# Patient Record
Sex: Male | Born: 1940 | Race: White | Hispanic: No | Marital: Married | Smoking: Never smoker
Health system: Southern US, Community
[De-identification: ages and names within clinical notes are randomized; demographics above are authoritative.]

## PROBLEM LIST (undated history)

## (undated) DIAGNOSIS — R21 Rash and other nonspecific skin eruption: Secondary | ICD-10-CM

## (undated) DIAGNOSIS — E781 Pure hyperglyceridemia: Secondary | ICD-10-CM

## (undated) DIAGNOSIS — I2510 Atherosclerotic heart disease of native coronary artery without angina pectoris: Secondary | ICD-10-CM

## (undated) DIAGNOSIS — G459 Transient cerebral ischemic attack, unspecified: Secondary | ICD-10-CM

## (undated) DIAGNOSIS — N390 Urinary tract infection, site not specified: Secondary | ICD-10-CM

## (undated) DIAGNOSIS — I10 Essential (primary) hypertension: Secondary | ICD-10-CM

## (undated) DIAGNOSIS — R079 Chest pain, unspecified: Secondary | ICD-10-CM

## (undated) DIAGNOSIS — R0609 Other forms of dyspnea: Secondary | ICD-10-CM

## (undated) DIAGNOSIS — I214 Non-ST elevation (NSTEMI) myocardial infarction: Secondary | ICD-10-CM

## (undated) DIAGNOSIS — K219 Gastro-esophageal reflux disease without esophagitis: Secondary | ICD-10-CM

## (undated) DIAGNOSIS — E785 Hyperlipidemia, unspecified: Secondary | ICD-10-CM

## (undated) DIAGNOSIS — I639 Cerebral infarction, unspecified: Secondary | ICD-10-CM

## (undated) DIAGNOSIS — R202 Paresthesia of skin: Secondary | ICD-10-CM

## (undated) DIAGNOSIS — R200 Anesthesia of skin: Secondary | ICD-10-CM

## (undated) DIAGNOSIS — J189 Pneumonia, unspecified organism: Secondary | ICD-10-CM

## (undated) DIAGNOSIS — Z201 Contact with and (suspected) exposure to tuberculosis: Secondary | ICD-10-CM

## (undated) HISTORY — DX: Contact with and (suspected) exposure to tuberculosis: Z20.1

## (undated) HISTORY — DX: Paresthesia of skin: R20.2

## (undated) HISTORY — DX: Anesthesia of skin: R20.0

## (undated) HISTORY — DX: Rash and other nonspecific skin eruption: R21

## (undated) HISTORY — DX: Hyperlipidemia, unspecified: E78.5

## (undated) HISTORY — DX: Essential (primary) hypertension: I10

## (undated) HISTORY — DX: Other forms of dyspnea: R06.09

## (undated) HISTORY — DX: Pure hyperglyceridemia: E78.1

## (undated) HISTORY — PX: BACK SURGERY: SHX140

## (undated) HISTORY — DX: Chest pain, unspecified: R07.9

## (undated) HISTORY — DX: Atherosclerotic heart disease of native coronary artery without angina pectoris: I25.10

## (undated) HISTORY — DX: Cerebral infarction, unspecified: I63.9

## (undated) HISTORY — DX: Non-ST elevation (NSTEMI) myocardial infarction: I21.4

---

## 1997-11-15 ENCOUNTER — Ambulatory Visit (HOSPITAL_COMMUNITY): Admission: RE | Admit: 1997-11-15 | Discharge: 1997-11-15 | Payer: Self-pay | Admitting: Orthopedic Surgery

## 2000-10-01 ENCOUNTER — Emergency Department (HOSPITAL_COMMUNITY): Admission: AC | Admit: 2000-10-01 | Discharge: 2000-10-01 | Payer: Self-pay

## 2003-04-05 ENCOUNTER — Emergency Department (HOSPITAL_COMMUNITY): Admission: EM | Admit: 2003-04-05 | Discharge: 2003-04-05 | Payer: Self-pay | Admitting: Emergency Medicine

## 2005-06-30 ENCOUNTER — Emergency Department (HOSPITAL_COMMUNITY): Admission: EM | Admit: 2005-06-30 | Discharge: 2005-06-30 | Payer: Self-pay | Admitting: Family Medicine

## 2005-07-05 ENCOUNTER — Emergency Department (HOSPITAL_COMMUNITY): Admission: EM | Admit: 2005-07-05 | Discharge: 2005-07-05 | Payer: Self-pay | Admitting: Family Medicine

## 2006-12-10 ENCOUNTER — Emergency Department (HOSPITAL_COMMUNITY): Admission: EM | Admit: 2006-12-10 | Discharge: 2006-12-10 | Payer: Self-pay | Admitting: Family Medicine

## 2007-01-13 DIAGNOSIS — I214 Non-ST elevation (NSTEMI) myocardial infarction: Secondary | ICD-10-CM

## 2007-01-13 HISTORY — DX: Non-ST elevation (NSTEMI) myocardial infarction: I21.4

## 2007-03-02 ENCOUNTER — Ambulatory Visit: Payer: Self-pay | Admitting: Cardiovascular Disease

## 2007-03-02 ENCOUNTER — Inpatient Hospital Stay (HOSPITAL_COMMUNITY): Admission: EM | Admit: 2007-03-02 | Discharge: 2007-03-05 | Payer: Self-pay | Admitting: Emergency Medicine

## 2007-04-23 ENCOUNTER — Emergency Department (HOSPITAL_COMMUNITY): Admission: EM | Admit: 2007-04-23 | Discharge: 2007-04-23 | Payer: Self-pay | Admitting: Family Medicine

## 2008-01-21 ENCOUNTER — Emergency Department (HOSPITAL_COMMUNITY): Admission: EM | Admit: 2008-01-21 | Discharge: 2008-01-21 | Payer: Self-pay | Admitting: Family Medicine

## 2008-01-24 ENCOUNTER — Ambulatory Visit (HOSPITAL_COMMUNITY): Admission: RE | Admit: 2008-01-24 | Discharge: 2008-01-24 | Payer: Self-pay | Admitting: Specialist

## 2008-01-26 ENCOUNTER — Encounter: Admission: RE | Admit: 2008-01-26 | Discharge: 2008-01-26 | Payer: Self-pay | Admitting: Specialist

## 2008-07-18 ENCOUNTER — Emergency Department (HOSPITAL_COMMUNITY): Admission: EM | Admit: 2008-07-18 | Discharge: 2008-07-18 | Payer: Self-pay | Admitting: Family Medicine

## 2008-07-23 ENCOUNTER — Emergency Department (HOSPITAL_COMMUNITY): Admission: EM | Admit: 2008-07-23 | Discharge: 2008-07-23 | Payer: Self-pay | Admitting: Family Medicine

## 2008-09-17 ENCOUNTER — Emergency Department (HOSPITAL_COMMUNITY): Admission: EM | Admit: 2008-09-17 | Discharge: 2008-09-17 | Payer: Self-pay | Admitting: Emergency Medicine

## 2008-09-21 HISTORY — PX: US ECHOCARDIOGRAPHY: HXRAD669

## 2008-10-03 HISTORY — PX: CARDIOVASCULAR STRESS TEST: SHX262

## 2009-01-25 ENCOUNTER — Emergency Department (HOSPITAL_COMMUNITY): Admission: EM | Admit: 2009-01-25 | Discharge: 2009-01-25 | Payer: Self-pay | Admitting: Family Medicine

## 2009-09-13 ENCOUNTER — Ambulatory Visit: Payer: Self-pay | Admitting: Cardiology

## 2010-03-29 LAB — URINE CULTURE: Colony Count: >=100000

## 2010-03-29 LAB — POCT URINALYSIS DIP (DEVICE)
Bilirubin Urine: NEGATIVE
Glucose, UA: NEGATIVE mg/dL
Ketones, ur: NEGATIVE mg/dL
Nitrite: POSITIVE — AB
Protein, ur: NEGATIVE mg/dL
Specific Gravity, Urine: 1.01 (ref 1.005–1.030)
Urobilinogen, UA: 0.2 mg/dL (ref 0.0–1.0)
pH: 5.5 (ref 5.0–8.0)

## 2010-04-11 ENCOUNTER — Telehealth: Payer: Self-pay | Admitting: Cardiology

## 2010-04-11 NOTE — Telephone Encounter (Signed)
ASKING FOR SAMPLES OF TRILIPIX. PLACED CHART IN BOX.

## 2010-04-11 NOTE — Telephone Encounter (Signed)
Called requesting samples of Trilipix. LM that we do not have any samples at this time.

## 2010-04-18 LAB — URINALYSIS, ROUTINE W REFLEX MICROSCOPIC
Bilirubin Urine: NEGATIVE
Glucose, UA: NEGATIVE mg/dL
Hgb urine dipstick: NEGATIVE
Ketones, ur: NEGATIVE mg/dL
Nitrite: NEGATIVE
Protein, ur: NEGATIVE mg/dL
Specific Gravity, Urine: 1.017 (ref 1.005–1.030)
Urobilinogen, UA: 0.2 mg/dL (ref 0.0–1.0)
pH: 6.5 (ref 5.0–8.0)

## 2010-04-18 LAB — COMPREHENSIVE METABOLIC PANEL
ALT: 21 U/L (ref 0–53)
AST: 27 U/L (ref 0–37)
Albumin: 3.6 g/dL (ref 3.5–5.2)
Alkaline Phosphatase: 39 U/L (ref 39–117)
BUN: 19 mg/dL (ref 6–23)
CO2: 26 mEq/L (ref 19–32)
Calcium: 9.1 mg/dL (ref 8.4–10.5)
Chloride: 106 mEq/L (ref 96–112)
Creatinine, Ser: 0.98 mg/dL (ref 0.4–1.5)
GFR calc Af Amer: >60 mL/min (ref >60)
GFR calc non Af Amer: >60 mL/min (ref >60)
Glucose, Bld: 82 mg/dL (ref 70–99)
Potassium: 3.7 mEq/L (ref 3.5–5.1)
Sodium: 139 mEq/L (ref 135–145)
Total Bilirubin: 0.7 mg/dL (ref 0.3–1.2)
Total Protein: 6.6 g/dL (ref 6.0–8.3)

## 2010-04-18 LAB — DIFFERENTIAL
Basophils Absolute: 0.1 10*3/uL (ref 0.0–0.1)
Basophils Relative: 1 % (ref 0–1)
Eosinophils Absolute: 0 10*3/uL (ref 0.0–0.7)
Eosinophils Relative: 0 % (ref 0–5)
Lymphocytes Relative: 20 % (ref 12–46)
Lymphs Abs: 1.5 10*3/uL (ref 0.7–4.0)
Monocytes Absolute: 0.5 10*3/uL (ref 0.1–1.0)
Monocytes Relative: 7 % (ref 3–12)
Neutro Abs: 5.5 10*3/uL (ref 1.7–7.7)
Neutrophils Relative %: 72 % (ref 43–77)

## 2010-04-18 LAB — LIPASE, BLOOD: Lipase: 28 U/L (ref 11–59)

## 2010-04-18 LAB — CBC
HCT: 38.8 % — ABNORMAL LOW (ref 39.0–52.0)
Hemoglobin: 13.2 g/dL (ref 13.0–17.0)
MCHC: 34.1 g/dL (ref 30.0–36.0)
MCV: 90.3 fL (ref 78.0–100.0)
Platelets: 145 10*3/uL — ABNORMAL LOW (ref 150–400)
RBC: 4.29 MIL/uL (ref 4.22–5.81)
RDW: 13.9 % (ref 11.5–15.5)
WBC: 7.6 10*3/uL (ref 4.0–10.5)

## 2010-05-08 ENCOUNTER — Telehealth: Payer: Self-pay | Admitting: Cardiology

## 2010-05-08 NOTE — Telephone Encounter (Signed)
ASKING FOR SAMPLES OF TRILIPIX.

## 2010-05-09 NOTE — Telephone Encounter (Signed)
Called requesting samples of Trilipix. None available at this time.LM

## 2010-05-19 ENCOUNTER — Other Ambulatory Visit: Payer: Self-pay | Admitting: Cardiology

## 2010-05-19 MED ORDER — CHOLINE FENOFIBRATE 135 MG PO CPDR: 135.0000 mg | DELAYED_RELEASE_CAPSULE | Freq: Every day | ORAL | Status: DC

## 2010-05-19 NOTE — Telephone Encounter (Signed)
NEEDS REFILL FOR TRIPLIPIX CALLED INTO [facility] [place] [id number]. PLACED CHART IN BOX.

## 2010-05-19 NOTE — Telephone Encounter (Signed)
Called requesting samples of Trilipix. Only had 2 boxes which will give to them. Also received refill for trilipix. escribed.

## 2010-05-27 NOTE — Cardiovascular Report (Signed)
NAME:  Colin Lopez, Colin Lopez NO.:  [id number]   MEDICAL RECORD NO.:  [id number]          PATIENT TYPE:  INP   LOCATION:  2925                         FACILITY:  [facility]   PHYSICIAN:  Elmore Guise., M.D.DATE OF BIRTH:  06-Jun-1940   DATE OF PROCEDURE:  03/03/2007  DATE OF DISCHARGE:                            CARDIAC CATHETERIZATION   INDICATIONS FOR PROCEDURE:  Non-ST-elevation myocardial infarction.   HISTORY OF PRESENT ILLNESS:  Mr. Neas is a very pleasant 70 year old  white male, with past medical history of hypertension, dyslipidemia, and  tobacco dependence, who presented with a 3-day history of increasing  exertional chest pain.  He is admitted with non-ST-elevation myocardial  infarction.  He is now referred for cardiac catheterization.   DESCRIPTION OF PROCEDURE:  The patient is brought to the cardiac  catheterization lab. After appropriate informed consent, he is prepped  and draped in sterile fashion.  Approximately 10 mL of 1% lidocaine was  used for local anesthesia.  A 6-French sheath was placed in the right  femoral artery without difficulty.  Coronary angiography, LV angiography  were then performed.  Sheath was left in place for elective PCI of his  circumflex/OM system.   FINDINGS:  1. Left main:  Short and normal appearing  2. LAD:  Moderate-sized vessel, with mild luminal regularities  3. D1:  Ostial proximal 20%-30% stenosis, with mild mid-distal luminal      irregularities.  4. D2:  Moderate-sized vessel, with mild luminal irregularities.  5. LCX:  Nondominant large vessel, mid 80-90% stenosis at bifurcation      with OM1 and OM2.  6. OM1/OM2:  Large vessels, with mild luminal irregularities.  7. RCA:  Dominant large vessel, proximal 50% stenosis, with diffuse      mid and distal luminal irregularities.  Faint collaterals were seen      to the left system.  8. PDA/PLV is patent, with mild to moderate luminal irregularities.  9. LV:  EF is  55%.  No wall motion abnormalities.  LVEDP is 12 mmHg.   IMPRESSION:  1. Obstructive single-vessel coronary artery disease (80%-90% mid left      circumflex stenosis).  2. Moderate nonobstructive right coronary artery disease, with      proximal 50% stenosis.  3. Nonobstructive left anterior descending.  4. Normal left ventricular systolic function, with an ejection      fraction of 55%.   PLAN:  At this time, I will discuss elective PCI of his LCx with Dr.  Swaziland (interventional cardiologist).  Otherwise, we will continue  aggressive medical treatment and risk factor modification as indicated.  The patient is on statin, aspirin, beta blockade.  We will discuss  complete tobacco cessation with him at length.  He will also be placed  on Plavix      Elmore Guise., M.D.  Electronically Signed     TWK/MEDQ  D:  03/03/2007  T:  03/04/2007  Job:  [id number]

## 2010-05-27 NOTE — Cardiovascular Report (Signed)
NAME:  Colin Lopez, Colin Lopez NO.:  [id number]   MEDICAL RECORD NO.:  [id number]          PATIENT TYPE:  INP   LOCATION:  2925                         FACILITY:  [facility]   PHYSICIAN:  [name] M. Swaziland, M.D.  DATE OF BIRTH:  May 02, 1940   DATE OF PROCEDURE:  03/03/2007  DATE OF DISCHARGE:                            CARDIAC CATHETERIZATION   INDICATION FOR PROCEDURE:  The patient is a 70 year old white male who  presented with a non-Q-wave myocardial infarction.  Coronary angiography  performed by Dr. Reyes Ivan demonstrated no significant disease in the LAD  distribution.  There was a high-grade 90% stenosis in the left  circumflex coronary artery just prior to bifurcation of the first obtuse  marginal vessel.  The first obtuse marginal vessel and second obtuse  marginal vessel were very large branches.  The right coronary artery  showed 50% disease proximally.  We elected to proceed with intervention  of the left circumflex stenosis.  Access was via the right femoral  artery with a 6-French sheath placed for the diagnostic cardiac  catheterization.  Guide catheter was a 6-French left Judkins 5-guide,  two 2140 Asahi medium wires were used, a 3.5 x 15 mm Maverick balloon,  3.5 x 16 mm  Liberte stent, 3.75 x 12 mm Quantum Maverick balloon and a  3.5 x 10 mm Dura Star balloon.   MEDICATIONS:  1. Angiomax 0.75 mg/kg bolus followed by continuous infusion of 1.75      mg/kg per hour.  Subsequent ACT was 326.  2. Plavix 600 mg orally.  3. Pepcid 20 mg IV.  4. Nitroglycerin 200 mcg intracoronary x 1.   PROCEDURE NOTE:  After initial guide shots were obtained, we proceeded  with intervention.  Both the first and second marginal vessels were  independently wired.  We then performed balloon inflation of the  circumflex lesion with the balloon crossing the wire down the second  marginal vessel.  This was dilated to 6 atmospheres.  This resulted in  good expansion of the lesion, but  there was significant focal intimal  disruption at the site of balloon  angioplasty.  We next proceeded to  stent the circumflex using a 3.5 x 16 mm Liberte stent.  This was  deployed across the first obtuse marginal vessel extending to the mid  circumflex.  It was deployed at 9 and then 12 atmospheres.  We next  postdilated the proximal portion of the stent using a 3.75 x 12 mm  Quantum Maverick balloon up to 12 atmospheres.  We dilated the distal  portion of the stent using a 3.5 x 10 mm Dura Star balloon up to 16  atmospheres.  This yielded an excellent angiographic result with 0%  residual stenosis.  There was no compromise of the first obtuse marginal  vessel which had only about 20% narrowing at the ostium.  There was TIMI  grade 3 flow down both branches.  The patient had some mild burning in  his throat, but otherwise was asymptomatic and remained hemodynamically  stable.  At this point, his right groin was closed using an  Angioseal  device with excellent hemostasis.   FINAL INTERPRETATION:  Successful intracoronary stenting of the proximal  to mid left circumflex coronary artery.   PLAN:  I would recommend continuing aspirin and Plavix postprocedure.           ______________________________  [name] M. Swaziland, M.D.     PMJ/MEDQ  D:  03/03/2007  T:  03/04/2007  Job:  [id number]   cc:   Elmore Guise., M.D.

## 2010-05-27 NOTE — H&P (Signed)
NAME:  Colin Lopez, Colin Lopez NO.:  [id number]   MEDICAL RECORD NO.:  [id number]          PATIENT TYPE:  INP   LOCATION:  2925                         FACILITY:  [facility]   PHYSICIAN:  Christell Faith, MD   DATE OF BIRTH:  06-10-1940   DATE OF ADMISSION:  03/02/2007  DATE OF DISCHARGE:                              HISTORY & PHYSICAL   CHIEF COMPLAINT:  Chest pain.   HISTORY OF PRESENT ILLNESS:  This is a 70 year old white man with a past  history of hypertension who presents to the [facility] Emergency  Department tonight with intermittent chest discomfort for the past 5  days.  It is described as a substernal burning, associated with a lot of  belching and is similar in quality to what he has previously thought was  acid reflux.  However, he has no sour brash taste in his mouth and this  pain has been coming on with exertion and in those regards is different  from his prior acid reflux.  In addition, earlier tonight there was a  strong pressure component and his pain rated 10/10, which precipitated  the trip to the emergency department.  He also complains of shortness of  breath with the chest discomfort and aching in his left shoulder.  He  denies a history of ulcer disease, denies blood in the stool, or blood  in his urine.  With nitroglycerin his pain improved but his blood  pressure dropped to 70/40 and he became very lightheaded.  He has  significant decreased p.o. intake recently and is dehydrated.  His pain  is currently 4/10, after nitroglycerin and morphine.   PAST MEDICAL HISTORY:  Hypertension.   SOCIAL HISTORY:  He lives in [place] with his wife.  He is a retired  Probation officer.  He rarely uses alcohol.  No tobacco.  No illegal drugs.   FAMILY HISTORY:  His mother and father's health is unknown.  The patient  is an orphan.  He has a son who died of congenital heart problems.  A  sister died of an MI in her 24s.  Another sister had bypass surgery in  her  53s.   ALLERGIES:  PENICILLIN.   MEDICINES:  None.   REVIEW OF SYSTEMS:  Positive for headache, chest pain, shortness of  breath, dyspnea on exertion, nausea, GERD, and a rash on his left leg,  otherwise the balance of 14 systems is reviewed and is negative.   PHYSICAL EXAMINATION:  VITAL SIGNS:  Temperature 96.6, pulse 87,  respiratory rate 15, blood pressure initially 174/116, and after  morphine and nitroglycerin was down to 70/40.  Oxygen saturation 94% on  room air.  GENERAL:  This is a pleasant white man in no acute distress.  Awake,  alert, oriented x3.  HEENT:  Pupils equal, round, reactive to light.  Extraocular movements  are intact.  Sclerae are clear.  Dentition is fair.  Mucous membranes  are moist.  No oral lesions.  No thrush.  NECK:  Supple.  Neck veins are flat.  No carotid bruits.  No cervical  lymphadenopathy.  No thyromegaly.  CARDIAC:  Normal rate, regular rhythm.  No murmurs or gallops.  ABDOMEN:  Soft, nontender, nondistended.  Normal bowel sounds.  LUNGS:  Clear to auscultation bilaterally without wheezing or rales.  SKIN:  Reveals a flat red rash on the left thigh which is pruritic.  EXTREMITIES:  No edema.  Skin is warm and dry.  Dorsalis pedis and  radial pulses are 2 plus bilaterally.  Right femoral pulse is 2 plus  without bruit.  MUSCULOSKELETAL:  There are no joint effusions or tenderness.  NEUROLOGIC:  No gross deficits.   REVIEW OF DIAGNOSTIC TESTS:  Chest x-ray:  Pending.  Electrocardiogram  shows a sinus rhythm with a rate of 86 beats per minute.  There is  probable left ventricular hypertrophy and subtle lateral ST depressions  which are new compared to prior EKG.   LABORATORY:  White blood cell 9.2, hemoglobin 15.9, platelets 126.  Sodium 135, potassium 3.7, BUN 18, creatinine 1.1.  Point-of-care CK-MB  12, point-of-care troponin 0.28.   IMPRESSION:  A 70 year old white man with chest burning occurring both  after eating and with exertion.   There is also a component of  progressive chest pressure which has been occurring with exertion and  which is associated with left shoulder pain and shortness of breath.  This has been getting worse for several months now.  Today, it was  severe and occurred at rest and is currently rated 4/10.  He has a  mildly abnormal electrocardiogram and point-of-care troponin.   PLAN:  1. Given his blood pressure issues on the nitroglycerin, we will admit      him to a stepdown cardiac unit.  2. Continue to cycle cardiac enzymes and EKGs to evaluate for      myocardial injury.  3. Treat with therapeutic heparin and aspirin as well as Lopressor and      Statin therapy.  4. We will attempt to make him pain free with IV nitroglycerin and      morphine.  5. He will be made NPO for probable cardiac catheterization in the      morning.  6. He will be hydrated overnight.  7. Check fasting lipid panel and thyroid tests.  8. We will hemoccult check his stools and use empiric b.i.d. proton      pump inhibitor.  9. Initiate glycoprotein IIb IIIa inhibitor for ongoing pain or      abnormal troponin, cath emergently if needed.      Christell Faith, MD  Electronically Signed     NDL/MEDQ  D:  03/02/2007  T:  03/03/2007  Job:  [id number]

## 2010-05-30 NOTE — Discharge Summary (Signed)
NAME:  Colin Lopez, Colin Lopez NO.:  [id number]   MEDICAL RECORD NO.:  [id number]          PATIENT TYPE:  INP   LOCATION:  4741                         FACILITY:  [facility]   PHYSICIAN:  Elmore Guise., M.D.DATE OF BIRTH:  09-Jul-1940   DATE OF ADMISSION:  03/02/2007  DATE OF DISCHARGE:  03/05/2007                               DISCHARGE SUMMARY   DISCHARGE DIAGNOSES:  1. Non-ST elevation myocardial infarction, status post elective PCI of      circumflex vessel.  2. Dyslipidemia.  3. History of hypertension.  4. Gastroesophageal reflux disease.   HISTORY OF PRESENT ILLNESS:  Colin Lopez is a very pleasant 70 year old  white male who was admitted on March 02, 2007, for evaluation of  acute coronary syndrome and non-ST elevation myocardial infarction.   HOSPITAL COURSE:  The patient's hospital course was uncomplicated.  He  underwent cardiac catheterization on March 03, 2007, showing a normal  appearing left main, moderate size LAD with mild luminal irregularities.  First and second diagonals had proximal 20%-30% stenosis with mild mid  and distal luminal irregularities. The circumflex was a large vessel  with mid 80%-90% stenosis at bifurcation with OM-1 vessel.  OM-1 and OM-  2 both were large vessels with mild luminal irregularities.  His RCA was  a dominant vessel, large-appearing with proximal 50% stenosis with  diffuse mid and distal luminal irregularities.  Faint collaterals were  noted from the distal right coronary to his left system.  Following his  diagnostic cath, the patient underwent successful PCI of his circumflex.   LABORATORY DATA:  His blood work during his hospitalization showed a  peak troponin of 6.5.  He had normal renal function with a BUN and  creatinine of 18 and 1.0, potassium level 4.1.  His platelets were a  little on the lower side following his catheterization.  He was kept  overnight to make sure that his platelets would come back up  to normal  range.  We discontinued his heparin and Pepcid, and he was finished with  Angiomax at that time.  He had a platelet later of 88,000.  On day of  discharge, his platelets had increased up to 102,000.  We did continue  his Plavix.  He has now been up and ambulatory without any significant  problems.  He will be discharged to home.   DISCHARGE MEDICATIONS:  1. Lipitor 80 mg daily.  2. Aspirin 325 mg daily.  3. Plavix 75 mg daily.  4. Metoprolol 25 mg twice daily.  5. Nitroglycerin 0.4 mg sublingual q.5 minutes p.r.n. chest pain.   FOLLOWUP:  His followup appointment will be with Dr. Lady Deutscher at  [facility] in 2 weeks.  He was advised to increase his  activity slowly.  He will have routine post cath/intervention  restrictions. He did have a non-drug-eluting stent placed.      Elmore Guise., M.D.  Electronically Signed     TWK/MEDQ  D:  03/24/2007  T:  03/25/2007  Job:  [id number]

## 2010-09-02 ENCOUNTER — Other Ambulatory Visit: Payer: Self-pay | Admitting: Cardiology

## 2010-10-03 LAB — DIFFERENTIAL
Basophils Absolute: 0
Basophils Relative: 1
Eosinophils Absolute: 0
Eosinophils Relative: 1
Lymphocytes Relative: 22
Lymphs Abs: 2
Monocytes Absolute: 0.8
Monocytes Relative: 9
Neutro Abs: 6.3
Neutrophils Relative %: 69

## 2010-10-03 LAB — COMPREHENSIVE METABOLIC PANEL
ALT: 22
AST: 27
Albumin: 3.5
Alkaline Phosphatase: 74
BUN: 17
CO2: 25
Calcium: 8 — ABNORMAL LOW
Chloride: 104
Creatinine, Ser: 0.93
GFR calc Af Amer: >60
GFR calc non Af Amer: >60
Glucose, Bld: 141 — ABNORMAL HIGH
Potassium: 3.6
Sodium: 135
Total Bilirubin: 0.9
Total Protein: 5.9 — ABNORMAL LOW

## 2010-10-03 LAB — CBC
HCT: 37.7 — ABNORMAL LOW
HCT: 40.5
HCT: 46
HCT: 46.3
Hemoglobin: 13.1
Hemoglobin: 14
Hemoglobin: 15.9
Hemoglobin: 15.9
MCHC: 34.3
MCHC: 34.5
MCHC: 34.6
MCHC: 34.8
MCV: 86.3
MCV: 86.4
MCV: 86.7
MCV: 86.8
Platelets: 102 — ABNORMAL LOW
Platelets: 126 — ABNORMAL LOW
Platelets: 134 — ABNORMAL LOW
Platelets: 88 — ABNORMAL LOW
RBC: 4.34
RBC: 4.69
RBC: 5.33
RBC: 5.33
RDW: 14.1
RDW: 14.4
RDW: 14.4
RDW: 14.8
WBC: 7.9
WBC: 9.1
WBC: 9.2
WBC: 9.2

## 2010-10-03 LAB — BASIC METABOLIC PANEL
BUN: 18
CO2: 26
Calcium: 8.5
Chloride: 105
Creatinine, Ser: 1.09
GFR calc Af Amer: >60
GFR calc non Af Amer: >60
Glucose, Bld: 94
Potassium: 4.1
Sodium: 137

## 2010-10-03 LAB — CARDIAC PANEL(CRET KIN+CKTOT+MB+TROPI)
CK, MB: 22 — ABNORMAL HIGH
CK, MB: 57.2 — ABNORMAL HIGH
Relative Index: 11.3 — ABNORMAL HIGH
Relative Index: 16.9 — ABNORMAL HIGH
Total CK: 194
Total CK: 338 — ABNORMAL HIGH
Troponin I: 3.84
Troponin I: 6.51

## 2010-10-03 LAB — HEPARIN LEVEL (UNFRACTIONATED): Heparin Unfractionated: <0.1 — ABNORMAL LOW

## 2010-10-03 LAB — CK TOTAL AND CKMB (NOT AT ARMC)
CK, MB: 23.4 — ABNORMAL HIGH
CK, MB: 37.6 — ABNORMAL HIGH
CK, MB: 70.2 — ABNORMAL HIGH
Relative Index: 12.7 — ABNORMAL HIGH
Relative Index: 18.5 — ABNORMAL HIGH
Relative Index: 19.2 — ABNORMAL HIGH
Total CK: 184
Total CK: 203
Total CK: 365 — ABNORMAL HIGH

## 2010-10-03 LAB — PROTIME-INR
INR: 1
Prothrombin Time: 13.4

## 2010-10-03 LAB — POCT CARDIAC MARKERS
CKMB, poc: 11.2
CKMB, poc: 12
Myoglobin, poc: 137
Myoglobin, poc: 175
Operator id: 234501
Operator id: 234501
Troponin i, poc: 0.24 — ABNORMAL HIGH
Troponin i, poc: 0.28 — ABNORMAL HIGH

## 2010-10-03 LAB — MAGNESIUM: Magnesium: 2.5

## 2010-10-03 LAB — TSH: TSH: 3.513

## 2010-10-03 LAB — I-STAT 8, (EC8 V) (CONVERTED LAB)
Acid-Base Excess: 1
BUN: 18
Bicarbonate: 25.2 — ABNORMAL HIGH
Chloride: 104
Glucose, Bld: 118 — ABNORMAL HIGH
HCT: 49
Hemoglobin: 16.7
Operator id: 234501
Potassium: 3.7
Sodium: 135
TCO2: 26
pCO2, Ven: 37.7 — ABNORMAL LOW
pH, Ven: 7.433 — ABNORMAL HIGH

## 2010-10-03 LAB — LIPID PANEL
Cholesterol: 229 — ABNORMAL HIGH
HDL: 34 — ABNORMAL LOW
LDL Cholesterol: 125 — ABNORMAL HIGH
Total CHOL/HDL Ratio: 6.7
Triglycerides: 352 — ABNORMAL HIGH
VLDL: 70 — ABNORMAL HIGH

## 2010-10-03 LAB — TROPONIN I
Troponin I: 0.83
Troponin I: 3.18
Troponin I: 3.33

## 2010-10-03 LAB — POCT I-STAT CREATININE
Creatinine, Ser: 1.1
Operator id: 234501

## 2010-10-03 LAB — APTT: aPTT: 70 — ABNORMAL HIGH

## 2010-10-04 ENCOUNTER — Encounter: Payer: Self-pay | Admitting: Cardiology

## 2010-10-09 ENCOUNTER — Telehealth: Payer: Self-pay | Admitting: Cardiology

## 2010-10-09 NOTE — Telephone Encounter (Addendum)
Pt's wife requesting Trilipix samples

## 2010-10-09 NOTE — Telephone Encounter (Signed)
Wife called requesting samples of Trilipix. Have available. Will pick up in AM

## 2010-10-24 ENCOUNTER — Ambulatory Visit: Payer: Self-pay | Admitting: Cardiology

## 2010-10-28 ENCOUNTER — Ambulatory Visit (INDEPENDENT_AMBULATORY_CARE_PROVIDER_SITE_OTHER): Payer: Medicare Other | Admitting: Cardiology

## 2010-10-28 ENCOUNTER — Encounter: Payer: Self-pay | Admitting: Cardiology

## 2010-10-28 VITALS — BP 162/92 | HR 81 | Ht 73.0 in | Wt 204.0 lb

## 2010-10-28 DIAGNOSIS — I259 Chronic ischemic heart disease, unspecified: Secondary | ICD-10-CM

## 2010-10-28 DIAGNOSIS — I2510 Atherosclerotic heart disease of native coronary artery without angina pectoris: Secondary | ICD-10-CM

## 2010-10-28 DIAGNOSIS — E785 Hyperlipidemia, unspecified: Secondary | ICD-10-CM

## 2010-10-28 DIAGNOSIS — I10 Essential (primary) hypertension: Secondary | ICD-10-CM

## 2010-10-28 MED ORDER — ROSUVASTATIN CALCIUM 10 MG PO TABS: 10.0000 mg | ORAL_TABLET | Freq: Every day | ORAL | Status: DC

## 2010-10-28 MED ORDER — LISINOPRIL 10 MG PO TABS: 10.0000 mg | ORAL_TABLET | Freq: Every day | ORAL | Status: DC

## 2010-10-28 MED ORDER — ATORVASTATIN CALCIUM 10 MG PO TABS: 10.0000 mg | ORAL_TABLET | Freq: Every day | ORAL | Status: DC

## 2010-10-28 NOTE — Progress Notes (Signed)
Colin Lopez Date of Birth: Sep 30, 1940 Medical Record #[id number]  History of Present Illness: Colin Lopez is seen for yearly followup today. He has a history of coronary disease with a non-ST elevation myocardial infarction in 2009. He underwent stenting of the left circumflex coronary with a 3.5 x 16 mm Liberte stent. His last stress nuclear study 2 years ago was normal. He does have a history of hypertension and hyperlipidemia. He is no longer taking Crestor because he states that it made him feel bad. He is not taking lisinopril that we had ordered last year. He denies any significant chest pain or shortness of breath. 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  Current Outpatient Prescriptions on File Prior to Visit  Medication Sig Dispense Refill  . aspirin 325 MG tablet Take 325 mg by mouth daily.        [name] b complex vitamins tablet Take 1 tablet by mouth daily.        . Cholecalciferol (VITAMIN D3 PO) Take by mouth daily.        . Choline Fenofibrate (TRILIPIX) 135 MG capsule Take 1 capsule (135 mg total) by mouth daily.  30 capsule  5  . fish oil-omega-3 fatty acids 1000 MG capsule Take 1,200 mg by mouth 2 (two) times daily.        . Multiple Vitamin (MULTIVITAMIN) tablet Take 1 tablet by mouth daily.        . hydrochlorothiazide 25 MG tablet TAKE ONE TABLET BY MOUTH EVERY DAY  30 tablet  4    Allergies  Allergen Reactions  . Penicillins     Past Medical History  Diagnosis Date  . Coronary disease     Status post stenting of the left circumflex coronary in 2009 with a bare-metal stent (with a 3.5x59mm Liberte stent)  . Dyslipidemia   . Hypertension     Poorly controlled    Past Surgical History  Procedure Date  . US echocardiography 09-21-08    EF 55-60%  . Cardiovascular stress test 10-03-08    EF 59%    History  Smoking status  . Former Smoker  Smokeless tobacco  . Not on file     History  Alcohol Use  . Yes    Rarely    History reviewed. No pertinent family history.  Review of Systems: The review of systems is positive for left shoulder pain as noted. He does not monitor his blood pressure at home..  All other systems were reviewed and are negative.  Physical Exam: BP 162/92  Pulse 81  Ht 6\' 1"  (1.854 m)  Wt 204 lb (92.534 kg)  BMI 26.91 kg/m2 The patient is alert and oriented x 3.  The mood and affect are normal.  The skin is warm and dry.  Color is normal.  The HEENT exam reveals that the sclera are nonicteric.  The mucous membranes are moist. He is hard of hearing. The carotids are 2+ without bruits.  There is no thyromegaly.  There is no JVD.  The lungs are clear.  The chest wall is non tender.  The heart exam reveals a regular rate with a normal S1 and S2.  There are no murmurs, gallops, or rubs.  The PMI is not displaced.   Abdominal exam reveals good bowel sounds.  There is no guarding or rebound.  There is no hepatosplenomegaly or tenderness.  There are no masses.  Exam of the legs reveal no clubbing, cyanosis, or edema.  The legs are without rashes.  The distal pulses are intact.  Cranial nerves II - XII are intact.  Motor and sensory functions are intact.  The gait is normal.  LABORATORY DATA:   Assessment / Plan:

## 2010-10-28 NOTE — Assessment & Plan Note (Signed)
He is status post stenting of the midcircumflex coronary in 2009. He is asymptomatic. Continue on aspirin. We will focus on risk factor modification.

## 2010-10-28 NOTE — Patient Instructions (Addendum)
We will add lisinopril 10 mg daily for blood pressure control.  We will add Lipitor 10 mg daily for your cholesterol.  Continue your other medications.  I will have you return in 2 months for fasting lab work and blood pressure check.  Appointment for fasting labs and BP check will be December 18. Any time that morning. Just make sure you are fasting. May have coffee and take meds with sip of water.

## 2010-10-28 NOTE — Assessment & Plan Note (Signed)
Since he is intolerant of Crestor we will try him on Lipitor 10 mg daily. He will continue with his trilipix and fish oil. We will have him return for fasting lab work in 2 months.

## 2010-10-28 NOTE — Assessment & Plan Note (Signed)
Blood pressure is poorly controlled today. I stressed the importance of sodium restriction. We will add lisinopril 10 mg daily to his current HCTZ. We'll recheck his blood pressure in 2 months.

## 2010-11-28 ENCOUNTER — Telehealth: Payer: Self-pay | Admitting: Cardiology

## 2010-11-28 NOTE — Telephone Encounter (Signed)
New problem  Sample of trilipix 135 mg.

## 2010-11-28 NOTE — Telephone Encounter (Signed)
Samples left at the front desk.  Mrs Scarlata was notified.

## 2010-12-29 ENCOUNTER — Telehealth: Payer: Self-pay | Admitting: Cardiology

## 2010-12-29 NOTE — Telephone Encounter (Signed)
Pt requesting samples of trilipix, ok to leave message

## 2010-12-30 ENCOUNTER — Other Ambulatory Visit: Payer: Medicare Other | Admitting: *Deleted

## 2010-12-30 NOTE — Telephone Encounter (Signed)
Called requesting samples of Trilipix. Have available. Will pick up. 

## 2011-01-28 ENCOUNTER — Telehealth: Payer: Self-pay | Admitting: Cardiology

## 2011-01-28 NOTE — Telephone Encounter (Signed)
New msg Pt wants samples of trilipix please leave a message

## 2011-01-28 NOTE — Telephone Encounter (Signed)
Patient called wanting samples of trilipix.Triplix 135 mg samples left at front desk 3rd floor.

## 2011-04-22 ENCOUNTER — Telehealth: Payer: Self-pay | Admitting: Cardiology

## 2011-04-22 NOTE — Telephone Encounter (Signed)
New Problem:     Patient's wife called in wondering if she could receive some samples of her her husband's Choline Fenofibrate (TRILIPIX) 135 MG capsule. Please call back and feel free to leave a message.

## 2011-04-22 NOTE — Telephone Encounter (Signed)
Patient's wife called back was told left samples of trilipix at front desk 3rd floor.

## 2011-05-26 ENCOUNTER — Telehealth: Payer: Self-pay | Admitting: Cardiology

## 2011-05-26 NOTE — Telephone Encounter (Signed)
Patient called no answer.Left message on personal voice mail Trilipix 135 mg samples left at front desk 3rd floor.

## 2011-05-26 NOTE — Telephone Encounter (Signed)
PER PT SPOUSE CALL THEY NEED SOME SAMPLES OF TRILIPIX 135 AND IF WE DON'T HAVE ANY THEY WOULD LIKE A PRESCRIPTION CALLED INTO [facility] IN [place] PLEASE

## 2011-06-30 ENCOUNTER — Telehealth: Payer: Self-pay | Admitting: Cardiology

## 2011-06-30 NOTE — Telephone Encounter (Signed)
Left message on personal voice mail trilipix 135 mg samples left at front desk 3rd floor.

## 2011-06-30 NOTE — Telephone Encounter (Signed)
New msg Pt wants samples of trilipix 135

## 2011-09-04 ENCOUNTER — Telehealth: Payer: Self-pay | Admitting: Cardiology

## 2011-09-04 NOTE — Telephone Encounter (Signed)
New msg Pt's wife called about trilipix 135 samples

## 2011-09-04 NOTE — Telephone Encounter (Signed)
Patient called spoke to patient's wife was told trilipex 135 mg samples left at 3rd floor front desk.

## 2011-10-22 ENCOUNTER — Telehealth: Payer: Self-pay | Admitting: Cardiology

## 2011-10-22 NOTE — Telephone Encounter (Signed)
5 weeks of samples lot #[id number] e exp 01/2012 put up front for pickup, left message

## 2011-10-22 NOTE — Telephone Encounter (Signed)
New problem  Sample of trilipix 135 mg.  

## 2011-11-05 ENCOUNTER — Ambulatory Visit: Payer: Self-pay | Admitting: Internal Medicine

## 2011-11-26 ENCOUNTER — Telehealth: Payer: Self-pay | Admitting: Cardiology

## 2011-11-26 NOTE — Telephone Encounter (Signed)
Pt calling to see if we have trilipix samples, pls call ok to leave message

## 2011-11-26 NOTE — Telephone Encounter (Signed)
Patient called spoke to wife was told trilipix 135 mg samples left at front desk 3rd floor.

## 2011-11-27 ENCOUNTER — Telehealth: Payer: Self-pay | Admitting: Cardiology

## 2011-11-27 MED ORDER — LISINOPRIL 10 MG PO TABS: 10.0000 mg | ORAL_TABLET | Freq: Every day | ORAL | Status: DC

## 2011-11-27 NOTE — Telephone Encounter (Signed)
Received call from patient's wife stating patient needs refill on lisinopril.Prescription sent to [facility] in [place].

## 2011-11-27 NOTE — Telephone Encounter (Signed)
Patient's wife came by stating that her husband's Lisinopril Rx has expired (according the the pharmacy).  She stated that the bottle indicated one more refill.  Patient needs his Lisinopril refilled.  She will check back with the pharmacy this afternoon.-[facility] [address] [place].

## 2011-11-27 NOTE — Telephone Encounter (Signed)
Patient called no answer.LMTC. 

## 2012-01-11 ENCOUNTER — Telehealth: Payer: Self-pay | Admitting: Cardiology

## 2012-01-11 NOTE — Telephone Encounter (Signed)
Patient called spoke to daughter was told trilipix 135 mg samples left at front desk 3rd floor.

## 2012-01-11 NOTE — Telephone Encounter (Signed)
New problem:   Samples of trilipix  135 mg

## 2012-03-16 ENCOUNTER — Telehealth: Payer: Self-pay

## 2012-03-16 MED ORDER — CHOLINE FENOFIBRATE 135 MG PO CPDR: 135.0000 mg | DELAYED_RELEASE_CAPSULE | Freq: Every day | ORAL | Status: DC

## 2012-03-16 NOTE — Telephone Encounter (Signed)
Spoke to patient's wife was told office out of trilipix samples.Refill sent to [facility] in [place].

## 2012-03-16 NOTE — Telephone Encounter (Signed)
Colin Lopez's wife called in to request samples of Trilipix for her husband. She can be reached at [phone]

## 2012-04-13 ENCOUNTER — Telehealth: Payer: Self-pay | Admitting: Cardiology

## 2012-04-13 NOTE — Telephone Encounter (Signed)
New problem   Pt want to request some free samples of Trilipic 135mg . Please call pt concerning this matter. Leave a message if you would like

## 2012-04-13 NOTE — Telephone Encounter (Signed)
Returned call to patient no answer.Left message on personal voice mail office out of trilipix samples.

## 2012-05-09 ENCOUNTER — Ambulatory Visit: Payer: Self-pay | Admitting: Internal Medicine

## 2012-05-18 ENCOUNTER — Telehealth: Payer: Self-pay | Admitting: Cardiology

## 2012-05-18 NOTE — Telephone Encounter (Signed)
Returned call to patient spoke to wife trilipix samples left at 3rd floor front desk.

## 2012-05-18 NOTE — Telephone Encounter (Signed)
New Problem:    Patient's wife called in requesting samples of the patient's Choline Fenofibrate (TRILIPIX) 135 MG capsule.  Please call back.

## 2012-06-29 ENCOUNTER — Other Ambulatory Visit: Payer: Self-pay

## 2012-06-29 ENCOUNTER — Telehealth: Payer: Self-pay

## 2012-06-29 MED ORDER — LISINOPRIL 10 MG PO TABS: 10.0000 mg | ORAL_TABLET | Freq: Every day | ORAL | Status: DC

## 2012-06-29 NOTE — Telephone Encounter (Signed)
Received call from patient's wife she stated husband needs refill on lisinopril.Advised patient needs appointment with Dr.Jordan.Appointment scheduled 09/02/12.Lisinopril refill sent to pharmacy

## 2012-09-02 ENCOUNTER — Ambulatory Visit (INDEPENDENT_AMBULATORY_CARE_PROVIDER_SITE_OTHER): Payer: Medicare Other | Admitting: Cardiology

## 2012-09-02 ENCOUNTER — Encounter: Payer: Self-pay | Admitting: Cardiology

## 2012-09-02 VITALS — BP 131/83 | HR 71 | Ht 73.0 in | Wt 204.8 lb

## 2012-09-02 DIAGNOSIS — I10 Essential (primary) hypertension: Secondary | ICD-10-CM

## 2012-09-02 DIAGNOSIS — I259 Chronic ischemic heart disease, unspecified: Secondary | ICD-10-CM

## 2012-09-02 DIAGNOSIS — E785 Hyperlipidemia, unspecified: Secondary | ICD-10-CM

## 2012-09-02 DIAGNOSIS — I2510 Atherosclerotic heart disease of native coronary artery without angina pectoris: Secondary | ICD-10-CM

## 2012-09-02 LAB — CBC WITH DIFFERENTIAL/PLATELET
Basophils Absolute: 0 10*3/uL (ref 0.0–0.1)
Basophils Relative: 0.7 % (ref 0.0–3.0)
Eosinophils Absolute: 0 10*3/uL (ref 0.0–0.7)
Eosinophils Relative: 0.5 % (ref 0.0–5.0)
HCT: 42 % (ref 39.0–52.0)
Hemoglobin: 14.3 g/dL (ref 13.0–17.0)
Lymphocytes Relative: 18.2 % (ref 12.0–46.0)
Lymphs Abs: 1.2 10*3/uL (ref 0.7–4.0)
MCHC: 34.1 g/dL (ref 30.0–36.0)
MCV: 87.5 fl (ref 78.0–100.0)
Monocytes Absolute: 0.6 10*3/uL (ref 0.1–1.0)
Monocytes Relative: 8.6 % (ref 3.0–12.0)
Neutro Abs: 4.9 10*3/uL (ref 1.4–7.7)
Neutrophils Relative %: 72 % (ref 43.0–77.0)
Platelets: 151 10*3/uL (ref 150.0–400.0)
RBC: 4.8 Mil/uL (ref 4.22–5.81)
RDW: 14.6 % (ref 11.5–14.6)
WBC: 6.9 10*3/uL (ref 4.5–10.5)

## 2012-09-02 LAB — LIPID PANEL
Cholesterol: 211 mg/dL — ABNORMAL HIGH (ref 0–200)
HDL: 32.3 mg/dL — ABNORMAL LOW (ref >39.00)
Total CHOL/HDL Ratio: 7
Triglycerides: 414 mg/dL — ABNORMAL HIGH (ref 0.0–149.0)
VLDL: 82.8 mg/dL — ABNORMAL HIGH (ref 0.0–40.0)

## 2012-09-02 LAB — BASIC METABOLIC PANEL
BUN: 21 mg/dL (ref 6–23)
CO2: 26 mEq/L (ref 19–32)
Calcium: 9 mg/dL (ref 8.4–10.5)
Chloride: 103 mEq/L (ref 96–112)
Creatinine, Ser: 0.9 mg/dL (ref 0.4–1.5)
GFR: 89.11 mL/min (ref >60.00)
Glucose, Bld: 95 mg/dL (ref 70–99)
Potassium: 4.2 mEq/L (ref 3.5–5.1)
Sodium: 136 mEq/L (ref 135–145)

## 2012-09-02 LAB — HEPATIC FUNCTION PANEL
ALT: 20 U/L (ref 0–53)
AST: 19 U/L (ref 0–37)
Albumin: 3.9 g/dL (ref 3.5–5.2)
Alkaline Phosphatase: 79 U/L (ref 39–117)
Bilirubin, Direct: 0.1 mg/dL (ref 0.0–0.3)
Total Bilirubin: 0.9 mg/dL (ref 0.3–1.2)
Total Protein: 7 g/dL (ref 6.0–8.3)

## 2012-09-02 LAB — LDL CHOLESTEROL, DIRECT: Direct LDL: 93.9 mg/dL

## 2012-09-02 NOTE — Progress Notes (Signed)
Colin Lopez Date of Birth: Jul 23, 1940 Medical Record #[id number]  History of Present Illness: Mr. Kross is seen for yearly followup today. He has a history of coronary disease with a non-ST elevation myocardial infarction in 2009. He underwent stenting of the left circumflex coronary with a 3.5 x 16 mm Liberte stent. His last stress nuclear study 2 years ago was normal. He does have a history of hypertension and hyperlipidemia. He is intolerant to Crestor. On followup today he states he is feeling very well. 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  Current Outpatient Prescriptions on File Prior to Visit  Medication Sig Dispense Refill  . aspirin 325 MG tablet Take 325 mg by mouth daily.        . Choline Fenofibrate (TRILIPIX) 135 MG capsule Take 1 capsule (135 mg total) by mouth daily.  30 capsule  6  . lisinopril (PRINIVIL,ZESTRIL) 10 MG tablet Take 1 tablet (10 mg total) by mouth daily.  30 tablet  2   No current facility-administered medications on file prior to visit.    Allergies  Allergen Reactions  . Penicillins     Past Medical History  Diagnosis Date  . Coronary disease     Status post stenting of the left circumflex coronary in 2009 with a bare-metal stent (with a 3.5x58mm Liberte stent)  . Dyslipidemia   . Hypertension     Poorly controlled    Past Surgical History  Procedure Laterality Date  . US echocardiography  09-21-08    EF 55-60%  . Cardiovascular stress test  10-03-08    EF 59%    History  Smoking status  . Former Smoker  Smokeless tobacco  . Not on file    History  Alcohol Use  . Yes    Comment: Rarely    History reviewed. No pertinent family history.  Review of Systems: As noted in history of present illness.  All other systems were reviewed and are negative.  Physical Exam: BP 131/83  Pulse 71  Ht 6\' 1"  (1.854 m)  Wt 204 lb 12.8 oz (92.897 kg)  BMI 27.03 kg/m2 The  patient is alert and oriented x 3.  The skin reveals a raised growth on his left ear with scab.  The HEENT exam reveals that the sclera are nonicteric.  The mucous membranes are moist. He is hard of hearing. The carotids are 2+ without bruits.  There is no thyromegaly.  There is no JVD.  The lungs are clear.  The chest wall is non tender.  The heart exam reveals a regular rate with a normal S1 and S2.  There are no murmurs, gallops, or rubs.  The PMI is not displaced.   Abdominal exam reveals good bowel sounds.  .  There are no masses.  Exam of the legs reveal no clubbing, cyanosis, or edema.  The legs are without rashes.  The distal pulses are intact.  Cranial nerves II - XII are intact.  Motor and sensory functions are intact.  The gait is normal.  LABORATORY DATA: ECG demonstrates normal sinus rhythm with a normal ECG. Rate is 75 beats per minute.  Assessment / Plan: 1. Coronary disease with prior stenting of the left circumflex coronary in 2009 with a bare-metal stent. He remains asymptomatic. We'll continue with aspirin. 2. Hypertension-well-controlled. 3. Dyslipidemia. Continue fenofibrate. Intolerant to statins. We will check fasting lab work today. 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  Will refer to dermatology.

## 2012-09-02 NOTE — Patient Instructions (Signed)
Continue your current therapy  We will check lab work today and refer you to dermatology.  I will see you in one year.

## 2012-09-05 ENCOUNTER — Other Ambulatory Visit: Payer: Self-pay

## 2012-09-22 ENCOUNTER — Other Ambulatory Visit: Payer: Self-pay | Admitting: *Deleted

## 2012-09-22 MED ORDER — LISINOPRIL 10 MG PO TABS: 10.0000 mg | ORAL_TABLET | Freq: Every day | ORAL | Status: DC

## 2012-11-24 ENCOUNTER — Telehealth: Payer: Self-pay | Admitting: Cardiology

## 2012-11-24 ENCOUNTER — Emergency Department: Payer: Self-pay | Admitting: Emergency Medicine

## 2012-11-24 LAB — URINALYSIS, COMPLETE
Bacteria: NONE SEEN
Bilirubin,UR: NEGATIVE
Blood: NEGATIVE
Glucose,UR: NEGATIVE mg/dL (ref 0–75)
Ketone: NEGATIVE
Leukocyte Esterase: NEGATIVE
Nitrite: NEGATIVE
Ph: 5 (ref 4.5–8.0)
Protein: NEGATIVE
RBC,UR: <1 /HPF (ref 0–5)
Specific Gravity: 1.017 (ref 1.003–1.030)
Squamous Epithelial: <1
WBC UR: 2 /HPF (ref 0–5)

## 2012-11-24 LAB — HEPATIC FUNCTION PANEL A (ARMC)
Albumin: 3.7 g/dL (ref 3.4–5.0)
Alkaline Phosphatase: 102 U/L (ref 50–136)
Bilirubin, Direct: <0.1 mg/dL (ref 0.00–0.20)
Bilirubin,Total: 0.4 mg/dL (ref 0.2–1.0)
SGOT(AST): 25 U/L (ref 15–37)
SGPT (ALT): 35 U/L (ref 12–78)
Total Protein: 6.9 g/dL (ref 6.4–8.2)

## 2012-11-24 LAB — CBC
HCT: 44 % (ref 40.0–52.0)
HGB: 15.1 g/dL (ref 13.0–18.0)
MCH: 29.8 pg (ref 26.0–34.0)
MCHC: 34.4 g/dL (ref 32.0–36.0)
MCV: 87 fL (ref 80–100)
Platelet: 147 10*3/uL — ABNORMAL LOW (ref 150–440)
RBC: 5.08 10*6/uL (ref 4.40–5.90)
RDW: 14.8 % — ABNORMAL HIGH (ref 11.5–14.5)
WBC: 7.9 10*3/uL (ref 3.8–10.6)

## 2012-11-24 LAB — BASIC METABOLIC PANEL
Anion Gap: 4 — ABNORMAL LOW (ref 7–16)
BUN: 20 mg/dL — ABNORMAL HIGH (ref 7–18)
Calcium, Total: 8.8 mg/dL (ref 8.5–10.1)
Chloride: 106 mmol/L (ref 98–107)
Co2: 28 mmol/L (ref 21–32)
Creatinine: 0.95 mg/dL (ref 0.60–1.30)
EGFR (African American): >60
EGFR (Non-African Amer.): >60
Glucose: 119 mg/dL — ABNORMAL HIGH (ref 65–99)
Osmolality: 279 (ref 275–301)
Potassium: 3.8 mmol/L (ref 3.5–5.1)
Sodium: 138 mmol/L (ref 136–145)

## 2012-11-24 NOTE — Telephone Encounter (Signed)
New problem:  Pt's wife states she would like Dr. [place] to refer her husband to someone. She states he is having severe back pain and has a hernia. Pt's wife didn't know who to take her husband to see for some relief. Please advise

## 2012-11-24 NOTE — Telephone Encounter (Signed)
Returned call to patient's wife she stated husband has had lower back pain that radiates around into rt hip and down into rt groin.Stated he has a knot in his rt groin appox size of a egg.Stated husband has been in bed all day with pain.Stated he can hardly walk. Stated he does not have a PCP.Advised she needs to take patient to ER or Urgent Care.Stated they live in [place] and she will take him to the Urgent Care in [facility] or [facility] ER.

## 2012-11-28 ENCOUNTER — Telehealth: Payer: Self-pay | Admitting: Cardiology

## 2012-11-28 ENCOUNTER — Emergency Department: Payer: Self-pay | Admitting: Emergency Medicine

## 2012-11-28 NOTE — Telephone Encounter (Signed)
Per wife pt was to see a Careers adviser but they cant see him for 3 weeks.  Pt is hurting worse today and are going to go back to ED @ [facility]. Pt was hoping there was something you could do to help him get in faster.  Told her I would pass msg along, but they are heading to ED.

## 2012-11-28 NOTE — Telephone Encounter (Signed)
New Problem  Wife called-- pt has a double hernia// pt went to [facility] on Thursday where they found the Double Hernia/// Pt is in a lot of pain and getting worse// she was simply requesting a call back to discuss if there is anything that our office can do to assist. Please call back

## 2012-11-29 NOTE — Telephone Encounter (Signed)
Returned call to patient's wife.LMTC. 

## 2012-11-30 NOTE — Telephone Encounter (Signed)
Returned call to patient's wife she stated she had to take husband back to [facility] ER night before last with severe pain in groin.Wife stated surgeon can't see husband until 12/13/12.Advised to call surgeon's office and let them know he needs to be seen sooner.

## 2012-11-30 NOTE — Telephone Encounter (Signed)
Follow up     Pt's wife is returning your call

## 2012-12-20 ENCOUNTER — Ambulatory Visit: Payer: Self-pay | Admitting: Surgery

## 2013-06-29 ENCOUNTER — Emergency Department (INDEPENDENT_AMBULATORY_CARE_PROVIDER_SITE_OTHER)
Admission: EM | Admit: 2013-06-29 | Discharge: 2013-06-29 | Disposition: A | Discharge status: Home / Self Care | Payer: Medicare Other | Source: Home / Self Care

## 2013-06-29 ENCOUNTER — Encounter (HOSPITAL_COMMUNITY): Payer: Self-pay | Admitting: Emergency Medicine

## 2013-06-29 ENCOUNTER — Emergency Department (INDEPENDENT_AMBULATORY_CARE_PROVIDER_SITE_OTHER): Payer: Medicare Other

## 2013-06-29 DIAGNOSIS — J309 Allergic rhinitis, unspecified: Secondary | ICD-10-CM

## 2013-06-29 DIAGNOSIS — J9801 Acute bronchospasm: Secondary | ICD-10-CM

## 2013-06-29 DIAGNOSIS — J040 Acute laryngitis: Secondary | ICD-10-CM

## 2013-06-29 DIAGNOSIS — J45909 Unspecified asthma, uncomplicated: Secondary | ICD-10-CM

## 2013-06-29 MED ORDER — IPRATROPIUM-ALBUTEROL 0.5-2.5 (3) MG/3ML IN SOLN
RESPIRATORY_TRACT | Status: AC
  Filled 2013-06-29: qty 3

## 2013-06-29 MED ORDER — ALBUTEROL SULFATE HFA 108 (90 BASE) MCG/ACT IN AERS: 2.0000 | INHALATION_SPRAY | RESPIRATORY_TRACT | Status: DC | PRN

## 2013-06-29 MED ORDER — GUAIFENESIN-CODEINE 100-10 MG/5ML PO SYRP: ORAL_SOLUTION | ORAL | Status: DC

## 2013-06-29 MED ORDER — ALBUTEROL SULFATE (2.5 MG/3ML) 0.083% IN NEBU
2.5000 mg | INHALATION_SOLUTION | Freq: Once | RESPIRATORY_TRACT | Status: AC
  Administered 2013-06-29: 2.5 mg via RESPIRATORY_TRACT

## 2013-06-29 MED ORDER — PREDNISONE 20 MG PO TABS: ORAL_TABLET | ORAL | Status: DC

## 2013-06-29 MED ORDER — IPRATROPIUM-ALBUTEROL 0.5-2.5 (3) MG/3ML IN SOLN
3.0000 mL | Freq: Once | RESPIRATORY_TRACT | Status: AC
  Administered 2013-06-29: 3 mL via RESPIRATORY_TRACT

## 2013-06-29 MED ORDER — ALBUTEROL SULFATE (2.5 MG/3ML) 0.083% IN NEBU
INHALATION_SOLUTION | RESPIRATORY_TRACT | Status: AC
  Filled 2013-06-29: qty 3

## 2013-06-29 NOTE — ED Notes (Signed)
Cough and cold symptoms for 6 days-chest congestion, productive cough, phlegm white/brown, sore throat, and laryngitis.  Patient also complains of diarrhea, one episode .

## 2013-06-29 NOTE — Discharge Instructions (Signed)
Allergic Rhinitis claritin 10 mg a day flonase nasal spray Lots of saline nasal spray Drink lots of water. Allergic rhinitis is when the mucous membranes in the nose respond to allergens. Allergens are particles in the air that cause your body to have an allergic reaction. This causes you to release allergic antibodies. Through a chain of events, these eventually cause you to release histamine into the blood stream. Although meant to protect the body, it is this release of histamine that causes your discomfort, such as frequent sneezing, congestion, and an itchy, runny nose.  CAUSES  Seasonal allergic rhinitis (hay fever) is caused by pollen allergens that may come from grasses, trees, and weeds. Year-round allergic rhinitis (perennial allergic rhinitis) is caused by allergens such as house dust mites, pet dander, and mold spores.  SYMPTOMS   Nasal stuffiness (congestion).  Itchy, runny nose with sneezing and tearing of the eyes. DIAGNOSIS  Your health care provider can help you determine the allergen or allergens that trigger your symptoms. If you and your health care provider are unable to determine the allergen, skin or blood testing may be used. TREATMENT  Allergic rhinitis does not have a cure, but it can be controlled by:  Medicines and allergy shots (immunotherapy).  Avoiding the allergen. Hay fever may often be treated with antihistamines in pill or nasal spray forms. Antihistamines block the effects of histamine. There are over-the-counter medicines that may help with nasal congestion and swelling around the eyes. Check with your health care provider before taking or giving this medicine.  If avoiding the allergen or the medicine prescribed do not work, there are many new medicines your health care provider can prescribe. Stronger medicine may be used if initial measures are ineffective. Desensitizing injections can be used if medicine and avoidance does not work. Desensitization is  when a patient is given ongoing shots until the body becomes less sensitive to the allergen. Make sure you follow up with your health care provider if problems continue. HOME CARE INSTRUCTIONS It is not possible to completely avoid allergens, but you can reduce your symptoms by taking steps to limit your exposure to them. It helps to know exactly what you are allergic to so that you can avoid your specific triggers. SEEK MEDICAL CARE IF:   You have a fever.  You develop a cough that does not stop easily (persistent).  You have shortness of breath.  You start wheezing.  Symptoms interfere with normal daily activities. Document Released: 09/23/2000 Document Revised: 01/03/2013 Document Reviewed: 09/05/2012 [facility] Patient Information 2015 [place], [place]. This information is not intended to replace advice given to you by your health care provider. Make sure you discuss any questions you have with your health care provider.  Bronchospasm A bronchospasm is when the tubes that carry air in and out of your lungs (airways) spasm or tighten. During a bronchospasm it is hard to breathe. This is because the airways get smaller. A bronchospasm can be triggered by:  Allergies. These may be to animals, pollen, food, or mold.  Infection. This is a common cause of bronchospasm.  Exercise.  Irritants. These include pollution, cigarette smoke, strong odors, aerosol sprays, and paint fumes.  Weather changes.  Stress.  Being emotional. HOME CARE   Always have a plan for getting help. Know when to call your doctor and local emergency services (911 in the [place] Know where you can get emergency care.  Only take medicines as told by your doctor.  If you were prescribed  an inhaler or nebulizer machine, ask your doctor how to use it correctly. Always use a spacer with your inhaler if you were given one.  Stay calm during an attack. Try to relax and breathe more slowly.  Control your home  environment:  Change your heating and air conditioning filter at least once a month.  Limit your use of fireplaces and wood stoves.  Do not  smoke. Do not  allow smoking in your home.  Avoid perfumes and fragrances.  Get rid of pests (such as roaches and mice) and their droppings.  Throw away plants if you see mold on them.  Keep your house clean and dust free.  Replace carpet with wood, tile, or vinyl flooring. Carpet can trap dander and dust.  Use allergy-proof pillows, mattress covers, and box spring covers.  Wash bed sheets and blankets every week in hot water. Dry them in a dryer.  Use blankets that are made of polyester or cotton.  Wash hands frequently. GET HELP IF:  You have muscle aches.  You have chest pain.  The thick spit you spit or cough up (sputum) changes from clear or white to yellow, green, gray, or bloody.  The thick spit you spit or cough up gets thicker.  There are problems that may be related to the medicine you are given such as:  A rash.  Itching.  Swelling.  Trouble breathing. GET HELP RIGHT AWAY IF:  You feel you cannot breathe or catch your breath.  You cannot stop coughing.  Your treatment is not helping you breathe better.  You have very bad chest pain. MAKE SURE YOU:   Understand these instructions.  Will watch your condition.  Will get help right away if you are not doing well or get worse. Document Released: 10/26/2008 Document Revised: 01/03/2013 Document Reviewed: 06/21/2012 [facility] Patient Information 2015 [place], [place]. This information is not intended to replace advice given to you by your health care provider. Make sure you discuss any questions you have with your health care provider.  Cough, Adult  A cough is a reflex that helps clear your throat and airways. It can help heal the body or may be a reaction to an irritated airway. A cough may only last 2 or 3 weeks (acute) or may last more than 8 weeks (chronic).   CAUSES Acute cough:  Viral or bacterial infections. Chronic cough:  Infections.  Allergies.  Asthma.  Post-nasal drip.  Smoking.  Heartburn or acid reflux.  Some medicines.  Chronic lung problems (COPD).  Cancer. SYMPTOMS   Cough.  Fever.  Chest pain.  Increased breathing rate.  High-pitched whistling sound when breathing (wheezing).  Colored mucus that you cough up (sputum). TREATMENT   A bacterial cough may be treated with antibiotic medicine.  A viral cough must run its course and will not respond to antibiotics.  Your caregiver may recommend other treatments if you have a chronic cough. HOME CARE INSTRUCTIONS   Only take over-the-counter or prescription medicines for pain, discomfort, or fever as directed by your caregiver. Use cough suppressants only as directed by your caregiver.  Use a cold steam vaporizer or humidifier in your bedroom or home to help loosen secretions.  Sleep in a semi-upright position if your cough is worse at night.  Rest as needed.  Stop smoking if you smoke. SEEK IMMEDIATE MEDICAL CARE IF:   You have pus in your sputum.  Your cough starts to worsen.  You cannot control your cough with suppressants  and are losing sleep.  You begin coughing up blood.  You have difficulty breathing.  You develop pain which is getting worse or is uncontrolled with medicine.  You have a fever. MAKE SURE YOU:   Understand these instructions.  Will watch your condition.  Will get help right away if you are not doing well or get worse. Document Released: 06/27/2010 Document Revised: 03/23/2011 Document Reviewed: 06/27/2010 [facility] Patient Information 2015 [place], [place]. This information is not intended to replace advice given to you by your health care provider. Make sure you discuss any questions you have with your health care provider.  How to Use an Inhaler Using your inhaler correctly is very important. Good technique will  make sure that the medicine reaches your lungs.  HOW TO USE AN INHALER: 1. Take the cap off the inhaler. 2. If this is the first time using your inhaler, you need to prime it. Shake the inhaler for 5 seconds. Release four puffs into the air, away from your face. Ask your doctor for help if you have questions. 3. Shake the inhaler for 5 seconds. 4. Turn the inhaler so the bottle is above the mouthpiece. 5. Put your pointer finger on top of the bottle. Your thumb holds the bottom of the inhaler. 6. Open your mouth. 7. Either hold the inhaler away from your mouth (the width of 2 fingers) or place your lips tightly around the mouthpiece. Ask your doctor which way to use your inhaler. 8. Breathe out as much air as possible. 9. Breathe in and push down on the bottle 1 time to release the medicine. You will feel the medicine go in your mouth and throat. 10. Continue to take a deep breath in very slowly. Try to fill your lungs. 11. After you have breathed in completely, hold your breath for 10 seconds. This will help the medicine to settle in your lungs. If you cannot hold your breath for 10 seconds, hold it for as long as you can before you breathe out. 12. Breathe out slowly, through pursed lips. Whistling is an example of pursed lips. 13. If your doctor has told you to take more than 1 puff, wait at least 15-30 seconds between puffs. This will help you get the best results from your medicine. Do not use the inhaler more than your doctor tells you to. 14. Put the cap back on the inhaler. 15. Follow the directions from your doctor or from the inhaler package about cleaning the inhaler. If you use more than one inhaler, ask your doctor which inhalers to use and what order to use them in. Ask your doctor to help you figure out when you will need to refill your inhaler.  If you use a steroid inhaler, always rinse your mouth with water after your last puff, gargle and spit out the water. Do not swallow the  water. GET HELP IF:  The inhaler medicine only partially helps to stop wheezing or shortness of breath.  You are having trouble using your inhaler.  You have some increase in thick spit (phlegm). GET HELP RIGHT AWAY IF:  The inhaler medicine does not help your wheezing or shortness of breath or you have tightness in your chest.  You have dizziness, headaches, or fast heart rate.  You have chills, fever, or night sweats.  You have a large increase of thick spit, or your thick spit is bloody. MAKE SURE YOU:   Understand these instructions.  Will watch your condition.  Will  get help right away if you are not doing well or get worse. Document Released: 10/08/2007 Document Revised: 10/19/2012 Document Reviewed: 07/28/2012 [facility] Patient Information 2015 [place], [place]. This information is not intended to replace advice given to you by your health care provider. Make sure you discuss any questions you have with your health care provider.

## 2013-06-29 NOTE — ED Provider Notes (Signed)
CSN: [id number]     Arrival date & time 06/29/13  69 History   First MD Initiated Contact with Patient 06/29/13 1115     Chief Complaint  Patient presents with  . URI   (Consider location/radiation/quality/duration/timing/severity/associated sxs/prior Treatment) HPI Comments: 73 year old male with history of CAD and status post stenting of the left circumflex coronary artery and 2009 presents with a 6 day history of laryngitis accompanied by PND and sneezing, frequent cough, shortness of breath that is worse at night, fatigue, malaise. Denies chest pain, heaviness, tightness, pressure or fullness. He is a former smoker. Other history includes hypertension and dyslipidemia.   Past Medical History  Diagnosis Date  . Coronary disease     Status post stenting of the left circumflex coronary in 2009 with a bare-metal stent (with a 3.5x60mm Liberte stent)  . Dyslipidemia   . Hypertension     Poorly controlled   Past Surgical History  Procedure Laterality Date  . US echocardiography  09-21-08    EF 55-60%  . Cardiovascular stress test  10-03-08    EF 59%  . Coronary angioplasty with stent placement     No family history on file. History  Substance Use Topics  . Smoking status: Former Research scientist (life sciences)  . Smokeless tobacco: Not on file  . Alcohol Use: Yes     Comment: Rarely    Review of Systems  Constitutional: Positive for activity change and fatigue. Negative for fever and diaphoresis.  HENT: Positive for congestion, postnasal drip, sore throat and voice change. Negative for facial swelling.   Eyes: Negative for pain, discharge and redness.  Respiratory: Positive for cough and shortness of breath. Negative for chest tightness.   Cardiovascular: Negative.   Gastrointestinal: Negative.   Genitourinary: Negative.   Musculoskeletal: Negative.  Negative for neck pain and neck stiffness.  Skin: Negative for rash.  Neurological: Negative.     Allergies  Penicillins  Home Medications    Prior to Admission medications   Medication Sig Start Date End Date Taking? Authorizing Provider  albuterol (PROVENTIL HFA;VENTOLIN HFA) 108 (90 BASE) MCG/ACT inhaler Inhale 2 puffs into the lungs every 4 (four) hours as needed for wheezing or shortness of breath. 06/29/13   Janne Napoleon, NP  aspirin 325 MG tablet Take 325 mg by mouth daily.      Historical Provider, MD  Choline Fenofibrate (TRILIPIX) 135 MG capsule Take 1 capsule (135 mg total) by mouth daily. 03/16/12   [name] M [name], MD  guaiFENesin-codeine [facility]) 100-10 MG/5ML syrup 1-2 tsp q 4h prn cough 06/29/13   Janne Napoleon, NP  KRILL OIL PO Take by mouth daily.    Historical Provider, MD  lisinopril (PRINIVIL,ZESTRIL) 10 MG tablet Take 1 tablet (10 mg total) by mouth daily. 09/22/12   [name] M [name], MD  Omega-3 Fatty Acids (FISH OIL) 1000 MG CAPS Take 4 grams daily ( 2 capsules twice a day ) 09/05/12   [name] M [name], MD  predniSONE (DELTASONE) 20 MG tablet 3 tabs po  X 2 days, 2 tabs po x 3 days, 1 tab po x 3 days. Take with food. 06/29/13   Janne Napoleon, NP   BP 146/77  Pulse 112  Temp(Src) 98.3 F (36.8 C) (Oral)  Resp 22  SpO2 93% Physical Exam  Nursing note and vitals reviewed. Constitutional: He is oriented to person, place, and time. He appears well-developed and well-nourished. No distress.  HENT:  Mouth/Throat: No oropharyngeal exudate.  Bilateral TMs are obscured by cerumen impaction.  Oropharynx with evidence of clear PND.  Eyes: Conjunctivae and EOM are normal.  Neck: Normal range of motion. Neck supple.  Cardiovascular: Normal rate, regular rhythm and normal heart sounds.   Pulmonary/Chest: No respiratory distress.  Tachypnea with respiratory rate of 30. Diminished breath sounds bilaterally Intermittent, distant wheezing bilaterally. With forced cough there is diffuse coarseness and wheezing and sputum production.  Musculoskeletal: Normal range of motion. He exhibits no edema.  Lymphadenopathy:    He has no  cervical adenopathy.  Neurological: He is alert and oriented to person, place, and time.  Skin: Skin is warm and dry. No rash noted.  Psychiatric: He has a normal mood and affect.    ED Course  Procedures (including critical care time) Labs Review Labs Reviewed - No data to display  Imaging Review Dg Chest 2 View  06/29/2013   CLINICAL DATA:  Shortness of breath.  EXAM: CHEST  2 VIEW  COMPARISON:  CT chest and PA and lateral chest 09/17/2008.  FINDINGS: The lungs are clear. Heart size is normal. No pneumothorax or pleural effusion.  IMPRESSION: No acute disease.   Electronically Signed   By: Inge Rise M.D.   On: 06/29/2013 11:56     MDM   1. Allergic rhinitis   2. RAD (reactive airway disease) with wheezing   3. Acute bronchospasm   4. Laryngitis     OTC meds for allergies, claritin or allegra for drainage Albuterol HFA Prednisone as dir #15 Cheratussin AC for cough Read instructions For worsening, fever, other problems despite meds go to the ED promptly.    Janne Napoleon, NP 06/29/13 [phone]

## 2013-06-30 NOTE — ED Provider Notes (Signed)
Medical screening examination/treatment/procedure(s) were performed by a resident physician or non-physician practitioner and as the supervising physician I was immediately available for consultation/collaboration.  Lynne Leader, MD    Gregor Hams, MD 06/30/13 [phone]

## 2013-07-03 NOTE — ED Notes (Signed)
Wife called, to say he is out of his cough medication, and will he be okay until 2 days from now when he has an appointment. Was advised to follow d/c instructions to drink plenty of fluids, use the medications as directed, and if they fell he needs to be seen sooner, to call the office to see if he can be worked in tomorrow or to take him to the ED id he is significantly worse

## 2013-07-04 ENCOUNTER — Ambulatory Visit (INDEPENDENT_AMBULATORY_CARE_PROVIDER_SITE_OTHER): Payer: Medicare Other | Admitting: Family Medicine

## 2013-07-04 ENCOUNTER — Telehealth: Payer: Self-pay

## 2013-07-04 ENCOUNTER — Telehealth: Payer: Self-pay | Admitting: Internal Medicine

## 2013-07-04 ENCOUNTER — Ambulatory Visit: Payer: Medicare Other

## 2013-07-04 VITALS — BP 152/80 | HR 99 | Temp 97.9°F | Resp 16 | Ht 69.0 in | Wt 205.0 lb

## 2013-07-04 DIAGNOSIS — J181 Lobar pneumonia, unspecified organism: Secondary | ICD-10-CM

## 2013-07-04 DIAGNOSIS — S40869S Insect bite (nonvenomous) of unspecified upper arm, sequela: Secondary | ICD-10-CM

## 2013-07-04 DIAGNOSIS — J45909 Unspecified asthma, uncomplicated: Secondary | ICD-10-CM

## 2013-07-04 DIAGNOSIS — R059 Cough, unspecified: Secondary | ICD-10-CM

## 2013-07-04 DIAGNOSIS — IMO0002 Reserved for concepts with insufficient information to code with codable children: Secondary | ICD-10-CM

## 2013-07-04 DIAGNOSIS — J209 Acute bronchitis, unspecified: Secondary | ICD-10-CM

## 2013-07-04 DIAGNOSIS — R05 Cough: Secondary | ICD-10-CM

## 2013-07-04 DIAGNOSIS — R0602 Shortness of breath: Secondary | ICD-10-CM

## 2013-07-04 DIAGNOSIS — W57XXXS Bitten or stung by nonvenomous insect and other nonvenomous arthropods, sequela: Secondary | ICD-10-CM

## 2013-07-04 DIAGNOSIS — J189 Pneumonia, unspecified organism: Secondary | ICD-10-CM

## 2013-07-04 LAB — POCT URINALYSIS DIPSTICK
Bilirubin, UA: NEGATIVE
Blood, UA: NEGATIVE
Glucose, UA: NEGATIVE
Ketones, UA: NEGATIVE
Leukocytes, UA: NEGATIVE
Nitrite, UA: NEGATIVE
Protein, UA: NEGATIVE
Spec Grav, UA: 1.01
Urobilinogen, UA: 1
pH, UA: 7

## 2013-07-04 LAB — POCT CBC
Granulocyte percent: 85.1 %G — AB (ref 37–80)
HCT, POC: 43.4 % — AB (ref 43.5–53.7)
Hemoglobin: 13.7 g/dL — AB (ref 14.1–18.1)
Lymph, poc: 1.5 (ref 0.6–3.4)
MCH, POC: 29.5 pg (ref 27–31.2)
MCHC: 31.6 g/dL — AB (ref 31.8–35.4)
MCV: 93.5 fL (ref 80–97)
MID (cbc): 1 — AB (ref 0–0.9)
MPV: 10.3 fL (ref 0–99.8)
POC Granulocyte: 14.1 — AB (ref 2–6.9)
POC LYMPH PERCENT: 9.1 %L — AB (ref 10–50)
POC MID %: 5.8 %M (ref 0–12)
Platelet Count, POC: 196 10*3/uL (ref 142–424)
RBC: 4.64 M/uL — AB (ref 4.69–6.13)
RDW, POC: 14.7 %
WBC: 16.6 10*3/uL — AB (ref 4.6–10.2)

## 2013-07-04 LAB — POCT UA - MICROSCOPIC ONLY
Bacteria, U Microscopic: NEGATIVE
Casts, Ur, LPF, POC: NEGATIVE
Crystals, Ur, HPF, POC: NEGATIVE
Epithelial cells, urine per micros: NEGATIVE
Mucus, UA: NEGATIVE
RBC, urine, microscopic: NEGATIVE
WBC, Ur, HPF, POC: NEGATIVE
Yeast, UA: NEGATIVE

## 2013-07-04 MED ORDER — IPRATROPIUM BROMIDE 0.02 % IN SOLN
0.5000 mg | Freq: Once | RESPIRATORY_TRACT | Status: AC
  Administered 2013-07-04: 0.5 mg via RESPIRATORY_TRACT

## 2013-07-04 MED ORDER — LEVOFLOXACIN 500 MG PO TABS: 500.0000 mg | ORAL_TABLET | Freq: Every day | ORAL | Status: DC

## 2013-07-04 MED ORDER — ALBUTEROL SULFATE (2.5 MG/3ML) 0.083% IN NEBU: 2.5000 mg | INHALATION_SOLUTION | Freq: Four times a day (QID) | RESPIRATORY_TRACT | Status: DC | PRN

## 2013-07-04 MED ORDER — DOXYCYCLINE HYCLATE 100 MG PO CAPS: 100.0000 mg | ORAL_CAPSULE | Freq: Two times a day (BID) | ORAL | Status: DC

## 2013-07-04 MED ORDER — ALBUTEROL SULFATE (2.5 MG/3ML) 0.083% IN NEBU
2.5000 mg | INHALATION_SOLUTION | Freq: Once | RESPIRATORY_TRACT | Status: AC
Start: 2013-07-04 — End: 2013-07-04
  Administered 2013-07-04: 2.5 mg via RESPIRATORY_TRACT

## 2013-07-04 NOTE — Telephone Encounter (Signed)
Pt wife notified.

## 2013-07-04 NOTE — Progress Notes (Addendum)
Subjective: 73 year old man with a history of chronic shortness of breath. He has an albuterol inhaler at home. He has been ill for about 9 days. [facility], and was treated with a tapered dose of prednisone. He is not improved a lot. He continues to be very short of breath and has a bad cough. He has been full in his hand and blowing out mucus. He's been having yellow drainage from his eyes at night. His cough has been day and night. He continues to be very short of breath. He wants an antibiotic. He has abdominal bloating and discomfort, or hurting a lot when he coughs. He says it makes it hard to breathe well because it's swollen. His chest x-[facility] at [facility] was negative. The CBC was normal there. He does not smoke. 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  Has recently had multiple tick bites on both lateral aspects chest wall and in umbilicus.    Objective: Overweight male who is very short of breath. He is hoarse. TMs are both occluded by wax. This are not grossly draining right now. Throat was clear. Nose congested. Chest has shallow respirations with soft expiratory wheeze. Heart regular without murmurs. No rub, rhonchi or rales could be heard. Abdomen has normal bowel. He is a little distended without any masses. Not tender to percussion but has some general mild tenderness on palpation. No ankle edema. Peak flow is 400 with predicted about 500  Tick bites all look like they're resolving uneventfully.  Assessment: Shortness of breath URI Wheezing Abdominal distention Tick bite  Plan: A CBC UA Albuterol nebulizer with Atrovent, which he will probably need for a couple of months.   Peak flow  Repeat peak flow was 550  Addendum  Suspect left lower lobe bronchopneumonia. Post treatment radiographs  recommended to document resolution.  Little change from the doxycycline to Levaquin 500  daily

## 2013-07-04 NOTE — Telephone Encounter (Signed)
I agree, UC is the best course of action

## 2013-07-04 NOTE — Progress Notes (Deleted)
Subjective

## 2013-07-04 NOTE — Telephone Encounter (Signed)
[facility] called to say this pt's CXR is suspicious a left lower lobe pneumonia. Spoke with Dr. Linna Darner. He want's to change this pt's abx from doxy to Levaquin. Left message on machine to call back on both home and cell. In the meantime, I called [facility] to cancel the doxy. He has not picked it up yet.

## 2013-07-04 NOTE — Telephone Encounter (Signed)
Mrs Chance said on 06/24/13 pt started with prod cough with yellow,greenish phlegm, pt was seen at [facility] on 06/29/13. Pt has not improved, ? Fever, not sleeping due to choking and cannot get in air; wheezing and dry heaves after some coughing episodes.No CP but sore in chest and stomach from coughing. Pts wife wants pt seen today; no available appts in office and Mrs Schmieg said pt refuses to go to [facility] or ED; pt wants to see a doctor in an office. Mrs Vandunk said pt has never had a PCP. Mrs Coate said pt will go to  [facility] [address]. Mrs Mummert said not to cancel 07/05/13 appt until after pt is seen today and she will cb if needed to cancel appt.

## 2013-07-04 NOTE — Telephone Encounter (Signed)
Emergent Call:  [name]/spouse phone: [phone] called regarding difficulty breathing. Has not yet been seen at [facility]. Reported has appointment with NP for 07/05/13.  Refused 911 per CSR call screening.  Informed CAN may not triage new or transferring pts who have not been seen in the office yet per MD protocol.  Advised if emergent symptoms, to be seen at [facility] or ED.  Wife said he wont go.  Instructed to call 911 for immediate evaluation if concerned symptoms are life threatening. Discussed situation with Lisabeth Pick, office triage nurse. She agreed to speak with wife; conferenced with [name].

## 2013-07-04 NOTE — Addendum Note (Signed)
Addended by: HOPPER, DAVID H on: 07/04/2013 03:48 PM   Modules accepted: Orders

## 2013-07-04 NOTE — Patient Instructions (Addendum)
Use the nebulizer machine every 4-6 hours as directed for wheezing. When you cannot use the machine at home usually the puffer 2 inhalations. Do not use both simultaneously.  Take doxycycline one twice daily  Drink plenty of fluids  Take Benadryl 25 mg one or 2 at bedtime and see if that will help you sleep also.  Return if worse  Followup with your primary care doctor in about a week to get your lungs rechecked.

## 2013-07-05 ENCOUNTER — Telehealth: Payer: Self-pay | Admitting: Internal Medicine

## 2013-07-05 ENCOUNTER — Telehealth: Payer: Self-pay

## 2013-07-05 ENCOUNTER — Ambulatory Visit: Payer: Medicare Other | Admitting: Internal Medicine

## 2013-07-05 NOTE — Telephone Encounter (Signed)
PATIENTS WIFE CALL STATING PATIENT WAS SEEN YESTERDAY AND SHE WAS TOLD NEBULIZER WOULD BE AT HER HOUSE WHEN SHE GOT HOME. PLEASE CONTACT  PATIENT AND ADVISE

## 2013-07-05 NOTE — Telephone Encounter (Signed)
Dr. Linna Darner do you know where the order for the neb went to?

## 2013-07-05 NOTE — Telephone Encounter (Signed)
Spoke to pt's wife and she scheduled a f/u appt with Dr Damita Dunnings 07/11/13

## 2013-07-05 NOTE — Telephone Encounter (Signed)
Ok to move appt out 7 days for follow up

## 2013-07-05 NOTE — Telephone Encounter (Signed)
Spoke to Dr.Hopper- Script was sent to [facility]. Tried calling x3 busy.  [phone]

## 2013-07-05 NOTE — Telephone Encounter (Signed)
lmovm for pt to return call--pt's wife

## 2013-07-05 NOTE — Telephone Encounter (Signed)
Caller: [name] Edds/Spouse; Phone: [phone]; Reason for Call: Wife is calling .  She states her husband has " A new patient appointment July 31, 2013.  HE HAS A SICK APPT SCHEDULED TODAY  07/05/13 at 13; 15 with [facility].  He was seen yesterday at [facility] /[address] in  Diagnosed - Acute bronchitis and pneumonia.  Given antibiotics for 7 days, breathing machine for home every 4-6 hours.  Wife wants to know since patient was seen yesterday can they move appt to 5-7 days out per [facility] follow up.  PLEASE CONTACT WIFE CONCERNING APPT TIME.

## 2013-07-05 NOTE — Telephone Encounter (Signed)
Spoke to [employer] The nebulizer is waiting to be approved by insurance. [facility] has the order.  Respiratory Therapist is on their way to set up Neb.   Pt wife advised.

## 2013-07-11 ENCOUNTER — Emergency Department (HOSPITAL_COMMUNITY): Payer: Medicare Other

## 2013-07-11 ENCOUNTER — Encounter: Payer: Self-pay | Admitting: Family Medicine

## 2013-07-11 ENCOUNTER — Inpatient Hospital Stay (HOSPITAL_COMMUNITY)
Admission: EM | Admit: 2013-07-11 | Discharge: 2013-07-13 | DRG: 193 | Disposition: A | Discharge status: Home / Self Care | Payer: Medicare Other | Attending: Internal Medicine | Admitting: Internal Medicine

## 2013-07-11 ENCOUNTER — Encounter (HOSPITAL_COMMUNITY): Payer: Self-pay | Admitting: Emergency Medicine

## 2013-07-11 ENCOUNTER — Ambulatory Visit (INDEPENDENT_AMBULATORY_CARE_PROVIDER_SITE_OTHER): Payer: Medicare Other | Admitting: Family Medicine

## 2013-07-11 VITALS — BP 104/62 | HR 115 | Temp 98.8°F | Wt 207.8 lb

## 2013-07-11 DIAGNOSIS — Z8249 Family history of ischemic heart disease and other diseases of the circulatory system: Secondary | ICD-10-CM

## 2013-07-11 DIAGNOSIS — J309 Allergic rhinitis, unspecified: Secondary | ICD-10-CM | POA: Diagnosis present

## 2013-07-11 DIAGNOSIS — Z880 Allergy status to penicillin: Secondary | ICD-10-CM

## 2013-07-11 DIAGNOSIS — E785 Hyperlipidemia, unspecified: Secondary | ICD-10-CM | POA: Diagnosis present

## 2013-07-11 DIAGNOSIS — Z9861 Coronary angioplasty status: Secondary | ICD-10-CM

## 2013-07-11 DIAGNOSIS — J9600 Acute respiratory failure, unspecified whether with hypoxia or hypercapnia: Secondary | ICD-10-CM | POA: Diagnosis present

## 2013-07-11 DIAGNOSIS — I2510 Atherosclerotic heart disease of native coronary artery without angina pectoris: Secondary | ICD-10-CM | POA: Diagnosis present

## 2013-07-11 DIAGNOSIS — Z87891 Personal history of nicotine dependence: Secondary | ICD-10-CM

## 2013-07-11 DIAGNOSIS — I252 Old myocardial infarction: Secondary | ICD-10-CM

## 2013-07-11 DIAGNOSIS — J189 Pneumonia, unspecified organism: Principal | ICD-10-CM | POA: Diagnosis present

## 2013-07-11 DIAGNOSIS — I10 Essential (primary) hypertension: Secondary | ICD-10-CM | POA: Diagnosis present

## 2013-07-11 DIAGNOSIS — I25810 Atherosclerosis of coronary artery bypass graft(s) without angina pectoris: Secondary | ICD-10-CM

## 2013-07-11 DIAGNOSIS — Z833 Family history of diabetes mellitus: Secondary | ICD-10-CM

## 2013-07-11 DIAGNOSIS — J9601 Acute respiratory failure with hypoxia: Secondary | ICD-10-CM | POA: Diagnosis present

## 2013-07-11 LAB — CBC WITH DIFFERENTIAL/PLATELET
Basophils Absolute: 0 10*3/uL (ref 0.0–0.1)
Basophils Relative: 0 % (ref 0–1)
Eosinophils Absolute: 0.2 10*3/uL (ref 0.0–0.7)
Eosinophils Relative: 1 % (ref 0–5)
HCT: 39.9 % (ref 39.0–52.0)
Hemoglobin: 13.6 g/dL (ref 13.0–17.0)
Lymphocytes Relative: 13 % (ref 12–46)
Lymphs Abs: 2.1 10*3/uL (ref 0.7–4.0)
MCH: 30.3 pg (ref 26.0–34.0)
MCHC: 34.1 g/dL (ref 30.0–36.0)
MCV: 88.9 fL (ref 78.0–100.0)
Monocytes Absolute: 1.3 10*3/uL — ABNORMAL HIGH (ref 0.1–1.0)
Monocytes Relative: 8 % (ref 3–12)
Neutro Abs: 12.7 10*3/uL — ABNORMAL HIGH (ref 1.7–7.7)
Neutrophils Relative %: 78 % — ABNORMAL HIGH (ref 43–77)
Platelets: 177 10*3/uL (ref 150–400)
RBC: 4.49 MIL/uL (ref 4.22–5.81)
RDW: 14 % (ref 11.5–15.5)
WBC Morphology: INCREASED
WBC: 16.3 10*3/uL — ABNORMAL HIGH (ref 4.0–10.5)

## 2013-07-11 LAB — COMPREHENSIVE METABOLIC PANEL
ALT: 28 U/L (ref 0–53)
AST: 20 U/L (ref 0–37)
Albumin: 3 g/dL — ABNORMAL LOW (ref 3.5–5.2)
Alkaline Phosphatase: 80 U/L (ref 39–117)
BUN: 21 mg/dL (ref 6–23)
CO2: 21 mEq/L (ref 19–32)
Calcium: 8.7 mg/dL (ref 8.4–10.5)
Chloride: 98 mEq/L (ref 96–112)
Creatinine, Ser: 1.07 mg/dL (ref 0.50–1.35)
GFR calc Af Amer: 77 mL/min — ABNORMAL LOW (ref >90)
GFR calc non Af Amer: 67 mL/min — ABNORMAL LOW (ref >90)
Glucose, Bld: 104 mg/dL — ABNORMAL HIGH (ref 70–99)
Potassium: 4.4 mEq/L (ref 3.7–5.3)
Sodium: 135 mEq/L — ABNORMAL LOW (ref 137–147)
Total Bilirubin: 0.3 mg/dL (ref 0.3–1.2)
Total Protein: 6.6 g/dL (ref 6.0–8.3)

## 2013-07-11 LAB — I-STAT TROPONIN, ED: Troponin i, poc: 0.01 ng/mL (ref 0.00–0.08)

## 2013-07-11 LAB — URINALYSIS, ROUTINE W REFLEX MICROSCOPIC
Bilirubin Urine: NEGATIVE
Glucose, UA: NEGATIVE mg/dL
Hgb urine dipstick: NEGATIVE
Ketones, ur: NEGATIVE mg/dL
Leukocytes, UA: NEGATIVE
Nitrite: NEGATIVE
Protein, ur: NEGATIVE mg/dL
Specific Gravity, Urine: >1.046 — ABNORMAL HIGH (ref 1.005–1.030)
Urobilinogen, UA: 1 mg/dL (ref 0.0–1.0)
pH: 6.5 (ref 5.0–8.0)

## 2013-07-11 LAB — I-STAT CG4 LACTIC ACID, ED: Lactic Acid, Venous: 0.9 mmol/L (ref 0.5–2.2)

## 2013-07-11 MED ORDER — IOHEXOL 350 MG/ML SOLN
100.0000 mL | Freq: Once | INTRAVENOUS | Status: AC | PRN
  Administered 2013-07-11: 100 mL via INTRAVENOUS

## 2013-07-11 MED ORDER — IPRATROPIUM-ALBUTEROL 0.5-2.5 (3) MG/3ML IN SOLN
3.0000 mL | Freq: Four times a day (QID) | RESPIRATORY_TRACT | Status: DC
  Administered 2013-07-12 – 2013-07-13 (×5): 3 mL via RESPIRATORY_TRACT
  Filled 2013-07-11 (×7): qty 3

## 2013-07-11 MED ORDER — AMLODIPINE BESYLATE 5 MG PO TABS
5.0000 mg | ORAL_TABLET | Freq: Every day | ORAL | Status: DC
  Administered 2013-07-12 – 2013-07-13 (×2): 5 mg via ORAL
  Filled 2013-07-11 (×2): qty 1

## 2013-07-11 MED ORDER — GUAIFENESIN ER 600 MG PO TB12
600.0000 mg | ORAL_TABLET | Freq: Two times a day (BID) | ORAL | Status: DC
  Administered 2013-07-12 – 2013-07-13 (×4): 600 mg via ORAL
  Filled 2013-07-11 (×4): qty 1

## 2013-07-11 MED ORDER — ALBUTEROL SULFATE (2.5 MG/3ML) 0.083% IN NEBU
5.0000 mg | INHALATION_SOLUTION | Freq: Once | RESPIRATORY_TRACT | Status: AC
  Administered 2013-07-11: 5 mg via RESPIRATORY_TRACT
  Filled 2013-07-11: qty 6

## 2013-07-11 MED ORDER — VANCOMYCIN HCL 1000 MG IV SOLR
1000.0000 mg | Freq: Once | INTRAVENOUS | Status: AC
  Administered 2013-07-11: 1000 mg via INTRAVENOUS
  Filled 2013-07-11: qty 1000

## 2013-07-11 MED ORDER — DEXTROSE 5 % IV SOLN: 2.0000 g | Freq: Once | INTRAVENOUS | Status: DC

## 2013-07-11 MED ORDER — IPRATROPIUM BROMIDE 0.02 % IN SOLN
0.5000 mg | Freq: Once | RESPIRATORY_TRACT | Status: AC
  Administered 2013-07-11: 0.5 mg via RESPIRATORY_TRACT
  Filled 2013-07-11: qty 2.5

## 2013-07-11 MED ORDER — LORATADINE 10 MG PO TABS
10.0000 mg | ORAL_TABLET | Freq: Every day | ORAL | Status: DC
  Administered 2013-07-12 – 2013-07-13 (×2): 10 mg via ORAL
  Filled 2013-07-11 (×2): qty 1

## 2013-07-11 NOTE — Assessment & Plan Note (Signed)
Has likely failed outpatient treatment.  Called charge RN at [facility] and patient has transport to go directly there.   He isn't hypoxic, but he is getting sig dyspnea with exertion.  Unclear if he has fluid overload/failure vs worsening PNA.  D/w pt.  Needs ER eval with possible admission.   He agrees.  Directly to ER via car.

## 2013-07-11 NOTE — ED Notes (Signed)
Orders for labs noted Due to power outage, lab is also down Lab work to be drawn, but there will be a delay in results due to power outage Confirmed with ED Charge Patient and pt's family made aware

## 2013-07-11 NOTE — ED Notes (Signed)
Patient back from CT Patient remains in NAD 

## 2013-07-11 NOTE — ED Notes (Signed)
Hospitalist at bedside 

## 2013-07-11 NOTE — Progress Notes (Signed)
Pre visit review using our clinic review tool, if applicable. No additional management support is needed unless otherwise documented below in the visit note.  Seen at [facility], then seen by Dr. Linna Darner. PNA seen on CXR 1 week ago, changed abx to levaquin.  Done with abx as of yesterday.  Done pre prev prednisone course.  Still on inhalers via neb.  No fevers but still with cold sweats.  Still SOB, not much better than prev.  Can't lay flat, more SOB supine.   R lower chest pain, anteriorly, likely sore from coughing.  No vomiting. Some diarrhea.  Discolored sputum. Nonsmoker.    Meds, vitals, and allergies reviewed.   ROS: See HPI.  Otherwise, noncontributory.  nad at rest but SOB with exertion.  Mmm Hoarse voice OP wnl Neck supple Tachy but regular Dec BS BLL, no inc in wob at rest, no wheeze abd soft Ext w/o edema 90%, occ up to 92% RA

## 2013-07-11 NOTE — ED Notes (Signed)
Patient transported to CT 

## 2013-07-11 NOTE — ED Provider Notes (Signed)
CSN: [id number]     Arrival date & time 07/11/13  1711 History   First MD Initiated Contact with Patient 07/11/13 1724     Chief Complaint  Patient presents with  . Pneumonia     (Consider location/radiation/quality/duration/timing/severity/associated sxs/prior Treatment) HPI Comments: Patient is a 73 year old male with history of coronary artery disease, dyslipidemia, hypertension who presents today with 3 weeks of gradually worsening shortness of breath. He reports that he was being treated for a pneumonia seen on x-ray with Levaquin. He continues to have a cough. He is more short of breath when he lays flat. No recent trips, unilateral leg swelling. He went to follow up at [facility] and was found to be tachycardic. On presentation to the emergency room his heart rate was 115 and his oxygen saturation on room air was 89%. He denies any prior history of lung disease and does not wear home oxygen. He states that he quit smoking greater than 15 years ago.  The history is provided by the patient. No language interpreter was used.    Past Medical History  Diagnosis Date  . Coronary disease     Status post stenting of the left circumflex coronary in 2009 with a bare-metal stent (with a 3.5x63mm Liberte stent)  . Dyslipidemia   . Hypertension     Poorly controlled  . Myocardial infarction    Past Surgical History  Procedure Laterality Date  . US echocardiography  09-21-08    EF 55-60%  . Cardiovascular stress test  10-03-08    EF 59%  . Coronary angioplasty with stent placement     Family History  Problem Relation Age of Onset  . Cancer Mother   . Diabetes Mother   . Heart disease Mother    History  Substance Use Topics  . Smoking status: Former Research scientist (life sciences)  . Smokeless tobacco: Not on file  . Alcohol Use: Yes     Comment: Rarely    Review of Systems  Constitutional: Negative for fever and chills.  Respiratory: Positive for cough, chest tightness and shortness of  breath.   Cardiovascular: Negative for chest pain and leg swelling.  Gastrointestinal: Negative for nausea, vomiting and abdominal pain.  All other systems reviewed and are negative.     Allergies  Penicillins  Home Medications   Prior to Admission medications   Medication Sig Start Date End Date Taking? Authorizing Marajade Lei  albuterol (PROVENTIL HFA;VENTOLIN HFA) 108 (90 BASE) MCG/ACT inhaler Inhale 2 puffs into the lungs every 4 (four) hours as needed for wheezing or shortness of breath. 06/29/13  Yes Janne Napoleon, NP  albuterol (PROVENTIL) (2.5 MG/3ML) 0.083% nebulizer solution Take 3 mLs (2.5 mg total) by nebulization every 6 (six) hours as needed for wheezing or shortness of breath. 07/04/13  Yes Posey Boyer, MD  KRILL OIL PO Take 1 capsule by mouth daily.    Yes Historical Demeka Sutter, MD  lisinopril (PRINIVIL,ZESTRIL) 10 MG tablet Take 1 tablet (10 mg total) by mouth daily. 09/22/12  Yes [name] M [name], MD   BP 125/75  Pulse 96  Temp(Src) 98.4 F (36.9 C) (Oral)  Resp 21  SpO2 94% Physical Exam  Nursing note and vitals reviewed. Constitutional: He is oriented to person, place, and time. He appears well-developed and well-nourished. No distress.  HENT:  Head: Normocephalic and atraumatic.  Right Ear: External ear normal.  Left Ear: External ear normal.  Nose: Nose normal.  Eyes: Conjunctivae are normal.  Neck: Normal range of motion. No  tracheal deviation present.  Cardiovascular: Regular rhythm and normal heart sounds.  Tachycardia present.   Pulmonary/Chest: Effort normal. No stridor. He has rales.  Abdominal: Soft. He exhibits no distension. There is no tenderness.  Musculoskeletal: Normal range of motion.  Neurological: He is alert and oriented to person, place, and time.  Skin: Skin is warm and dry. He is not diaphoretic.  Psychiatric: He has a normal mood and affect. His behavior is normal.    ED Course  Procedures (including critical care time) Labs Review Labs  Reviewed  CBC WITH DIFFERENTIAL - Abnormal; Notable for the following:    WBC 16.3 (*)    Neutrophils Relative % 78 (*)    Neutro Abs 12.7 (*)    Monocytes Absolute 1.3 (*)    All other components within normal limits  COMPREHENSIVE METABOLIC PANEL - Abnormal; Notable for the following:    Sodium 135 (*)    Glucose, Bld 104 (*)    Albumin 3.0 (*)    GFR calc non Af Amer 67 (*)    GFR calc Af Amer 77 (*)    All other components within normal limits  URINALYSIS, ROUTINE W REFLEX MICROSCOPIC - Abnormal; Notable for the following:    Specific Gravity, Urine >1.046 (*)    All other components within normal limits  LIPID PANEL - Abnormal; Notable for the following:    Triglycerides 338 (*)    HDL 24 (*)    VLDL 68 (*)    All other components within normal limits  BASIC METABOLIC PANEL - Abnormal; Notable for the following:    Glucose, Bld 109 (*)    GFR calc non Af Amer 81 (*)    All other components within normal limits  CBC - Abnormal; Notable for the following:    WBC 12.5 (*)    RBC 4.13 (*)    Hemoglobin 12.2 (*)    HCT 36.7 (*)    All other components within normal limits  GLUCOSE, CAPILLARY - Abnormal; Notable for the following:    Glucose-Capillary 180 (*)    All other components within normal limits  CULTURE, BLOOD (ROUTINE X 2)  CULTURE, BLOOD (ROUTINE X 2)  CULTURE, EXPECTORATED SPUTUM-ASSESSMENT  URINE CULTURE  GRAM STAIN  RESPIRATORY VIRUS PANEL  PRO B NATRIURETIC PEPTIDE  HIV ANTIBODY (ROUTINE TESTING)  LEGIONELLA ANTIGEN, URINE  STREP PNEUMONIAE URINARY ANTIGEN  TSH  BASIC METABOLIC PANEL  CBC  I-STAT CG4 LACTIC ACID, ED  I-STAT TROPOININ, ED    Imaging Review Dg Chest 2 View  07/11/2013   CLINICAL DATA:  PNEUMONIA  EXAM: CHEST  2 VIEW  COMPARISON:  Prior radiograph from 07/04/2013  FINDINGS: The cardiac and mediastinal silhouettes are stable in size and contour, and remain within normal limits.  The lungs are normally inflated. Persistent patchy  infiltrate seen within the retrocardiac left lower lobe, slightly improved from prior. No interval worsening or new focal infiltrates identified. No pneumothorax. No pulmonary edema or pleural effusion.  No acute osseous abnormality identified. Moderate multilevel degenerative changes seen within the visualized spine.  IMPRESSION: 1. Persistent patchy infiltrate within the retrocardiac left lower lobe, stable to slightly improved relative to prior. No interval worsening or new focal infiltrates identified. Please note that these changes can take up to 6 weeks to resolve.   Electronically Signed   By: Jeannine Boga M.D.   On: 07/11/2013 19:39   Ct Angio Chest Pe W/cm &/or Wo Cm  07/11/2013   CLINICAL DATA:  Shortness  of breath and chest pain  EXAM: CT ANGIOGRAPHY CHEST WITH CONTRAST  TECHNIQUE: Multidetector CT imaging of the chest was performed using the standard protocol during bolus administration of intravenous contrast. Multiplanar CT image reconstructions and MIPs were obtained to evaluate the vascular anatomy.  CONTRAST:  110mL OMNIPAQUE IOHEXOL 350 MG/ML SOLN  COMPARISON:  07/11/2013, 09/17/2008  FINDINGS: There is mild medial right lower lobe atelectasis. There is heavy infiltrate throughout the inferior half of the left lower lobe. There is mild bilateral lower lobe bronchiectasis.  There is no pleural or pericardial effusion. There are small presumably reactive mediastinal lymph nodes. There is coronary arterial calcification. There appears to be a circumflex artery stent. There is calcification of the abdominal aorta with no evidence of aortic dissection or dilatation.  There are no filling defects in the pulmonary arterial system. Scans through the upper abdomen are unremarkable. There are no acute musculoskeletal findings.  Review of the MIP images confirms the above findings.  IMPRESSION: Left lower lobe pneumonia.  No evidence of pulmonary embolism.   Electronically Signed   By: Skipper Cliche M.D.   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 He has a left lower lobe pneumonia proven on both x-ray and CT scan she appeared there are no masses in his chest to suggest malignancy. No pulmonary embolism. Patient has failed outpatient treatment on Levaquin. Discussed case with Dr. Dyann Kief who agrees to admission. Admission is appreciated. Patient is hemodynamically stable. Dr. Kathrynn Humble evaluated patient and agrees with plan. Patient / Family / Caregiver informed of clinical course, understand medical decision-making process, and agree with plan.     Elwyn Lade, PA-C 07/12/13 2143

## 2013-07-11 NOTE — ED Notes (Signed)
Pt being sent by Outpatient Clinic.  C/o SOB and tachycardia.  Pt diagnosed w/ PNA  x 1 week and recently completed a round of Levaquin.

## 2013-07-11 NOTE — ED Notes (Signed)
LLL lung sounds diminished Patient with productive cough Duo neb tx given

## 2013-07-11 NOTE — ED Notes (Signed)
Patient made aware of CT

## 2013-07-11 NOTE — ED Notes (Signed)
Patient back from xray Phlebotomy at bedside to draw labs

## 2013-07-11 NOTE — ED Notes (Signed)
Pt ambulated to the restroom in attempt to collect urine sample.  

## 2013-07-11 NOTE — Patient Instructions (Signed)
Go directly to the ER at [facility].  Take care.  I called ahead.

## 2013-07-11 NOTE — H&P (Signed)
[facility] History and Physical  Colin Lopez GYI:[id number] DOB: [birth year] DOA: 07/11/2013  Referring physician: Cleatrice Burke, PA PCP: Webb Silversmith, NP   Chief Complaint: Shortness of breath, productive cough and chills  HPI: Colin Lopez is a 73 y.o. male with past medical history significant for hypertension, dyslipidemia, coronary artery disease (status post metal stent of the left circumflex in 2009) and allergic rhinitis; came to the hospital complaining of shortness of breath, productive cough and chills/fevers. Patient reports that for the last 3 weeks or so he has been experiencing difficulty breathing with associated productive cough. Patient reports that initially he was treated at urgent care with tapering prednisone and inhalers course for bronchospasm/bronchitis; at that moment his symptoms worsen and he noticed development of chills/subjective fevers, treated with a course of antibiotics at PCP office. Patient reports minimal improvement on his symptoms with the use of antibiotics and during his followup visit he was a still with significant wheezing, hypoxia on room air and complaining of productive cough with intermittent episode of fevers. Prior to physician office advise patient to come to the ED for further evaluation and treatment. In the ED patient was found to be hypoxic (88-89 oxygen saturation on room air), tachypneic with a respiratory rate up to 24-26 at rest and elevated wbc's at 16,000. Chest x-ray and CT of the chest demonstrated left lower lobe pneumonia. [facility] has been called to admit the patient for further evaluation and treatment.   Review of Systems:  Chills and subjective fevers at home; otherwise negative except as mentioned on history of present illness.  Past Medical History  Diagnosis Date  . Coronary disease     Status post stenting of the left circumflex coronary in 2009 with a bare-metal stent (with a 3.5x53mm Liberte stent)    . Dyslipidemia   . Hypertension     Poorly controlled  . Myocardial infarction    Past Surgical History  Procedure Laterality Date  . US echocardiography  09-21-08    EF 55-60%  . Cardiovascular stress test  10-03-08    EF 59%  . Coronary angioplasty with stent placement     Social History:  reports that he has quit smoking. He does not have any smokeless tobacco history on file. He reports that he drinks alcohol. His drug history is not on file.  Allergies  Allergen Reactions  . Penicillins Hives    Family History  Problem Relation Age of Onset  . Cancer Mother   . Diabetes Mother   . Heart disease Mother      Prior to Admission medications   Medication Sig Start Date End Date Taking? Authorizing Provider  albuterol (PROVENTIL HFA;VENTOLIN HFA) 108 (90 BASE) MCG/ACT inhaler Inhale 2 puffs into the lungs every 4 (four) hours as needed for wheezing or shortness of breath. 06/29/13  Yes Janne Napoleon, NP  albuterol (PROVENTIL) (2.5 MG/3ML) 0.083% nebulizer solution Take 3 mLs (2.5 mg total) by nebulization every 6 (six) hours as needed for wheezing or shortness of breath. 07/04/13  Yes Posey Boyer, MD  KRILL OIL PO Take 1 capsule by mouth daily.    Yes Historical Provider, MD  lisinopril (PRINIVIL,ZESTRIL) 10 MG tablet Take 1 tablet (10 mg total) by mouth daily. 09/22/12  Yes [name] M [name], MD   Physical Exam: Filed Vitals:   07/11/13 2328  BP: 114/62  Pulse: 99  Temp:   Resp: 24    BP 114/62  Pulse 99  Temp(Src) 98.4 F (  36.9 C) (Oral)  Resp 24  SpO2 94%  General: Alert, awake and oriented x3; patient with coughing spells throughout the interview; currently afebrile. Mild difficulty speaking in full sentences due to shortness of breath. Eyes: PERRL, normal lids, irises & conjunctiva; no icterus, no nystagmus ENT: grossly normal hearing, lips & tongue; moist mucous membranes, positive rhinorrhea; no drainage out of his ears. No erythema or exudates appreciated inside  his mouth.  Neck: no LAD, masses or thyromegaly; no JVD  Cardiovascular: RRR, no m/r/g. No LE edema. Respiratory:  decreased breath sounds bilaterally, positive rhonchi and diffuse expiratory wheezing  Abdomen: soft, nt, nd; positive bowel sounds. No rebound  Skin: no rash or  petechiae Musculoskeletal: grossly normal tone BUE/BLE Psychiatric: grossly normal mood and affect, speech fluent and appropriate Neurologic: grossly non-focal.          Labs on Admission:  Basic Metabolic Panel:  Recent Labs Lab 07/11/13 1940  NA 135*  K 4.4  CL 98  CO2 21  GLUCOSE 104*  BUN 21  CREATININE 1.07  CALCIUM 8.7   Liver Function Tests:  Recent Labs Lab 07/11/13 1940  AST 20  ALT 28  ALKPHOS 80  BILITOT 0.3  PROT 6.6  ALBUMIN 3.0*   CBC:  Recent Labs Lab 07/11/13 1940  WBC 16.3*  NEUTROABS 12.7*  HGB 13.6  HCT 39.9  MCV 88.9  PLT 177   Radiological Exams on Admission: Dg Chest 2 View  07/11/2013   CLINICAL DATA:  PNEUMONIA  EXAM: CHEST  2 VIEW  COMPARISON:  Prior radiograph from 07/04/2013  FINDINGS: The cardiac and mediastinal silhouettes are stable in size and contour, and remain within normal limits.  The lungs are normally inflated. Persistent patchy infiltrate seen within the retrocardiac left lower lobe, slightly improved from prior. No interval worsening or new focal infiltrates identified. No pneumothorax. No pulmonary edema or pleural effusion.  No acute osseous abnormality identified. Moderate multilevel degenerative changes seen within the visualized spine.  IMPRESSION: 1. Persistent patchy infiltrate within the retrocardiac left lower lobe, stable to slightly improved relative to prior. No interval worsening or new focal infiltrates identified. Please note that these changes can take up to 6 weeks to resolve.   Electronically Signed   By: Jeannine Boga M.D.   On: 07/11/2013 19:39   Ct Angio Chest Pe W/cm &/or Wo Cm  07/11/2013   CLINICAL DATA:  Shortness of  breath and chest pain  EXAM: CT ANGIOGRAPHY CHEST WITH CONTRAST  TECHNIQUE: Multidetector CT imaging of the chest was performed using the standard protocol during bolus administration of intravenous contrast. Multiplanar CT image reconstructions and MIPs were obtained to evaluate the vascular anatomy.  CONTRAST:  192mL OMNIPAQUE IOHEXOL 350 MG/ML SOLN  COMPARISON:  07/11/2013, 09/17/2008  FINDINGS: There is mild medial right lower lobe atelectasis. There is heavy infiltrate throughout the inferior half of the left lower lobe. There is mild bilateral lower lobe bronchiectasis.  There is no pleural or pericardial effusion. There are small presumably reactive mediastinal lymph nodes. There is coronary arterial calcification. There appears to be a circumflex artery stent. There is calcification of the abdominal aorta with no evidence of aortic dissection or dilatation.  There are no filling defects in the pulmonary arterial system. Scans through the upper abdomen are unremarkable. There are no acute musculoskeletal findings.  Review of the MIP images confirms the above findings.  IMPRESSION: Left lower lobe pneumonia.  No evidence of pulmonary embolism.   Electronically Signed  By: Skipper Cliche M.D.   On: 07/11/2013 21:57    EKG: Ordered and pending at the moment of this dictation.   Assessment/Plan 1-Acute respiratory failure with hypoxia: Appears to be secondary to community acquired pneumonia. -Admit to MedSurg bed -Will check blood cultures, check sputum culture, respiratory virus panel, strep and Legionella urine antigen -Given the fact the patient's symptoms has worsened despite initial antibiotic treatment will provide treatment with aztreonam and doxycycline -Will give duo nebs 4 times a day and Pulmicort twice a day -Patient will be started on Mucinex twice a day -As needed oxygen supplementation with goal of O2 saturation more than 90% -Will also start Incentive spirometer -Follow clinical  response.  2-Coronary disease: Patient denies chest pain. Troponins negative. EKG order and pending at the moment of this dictation. ED telemetry monitor without  appreciation for acute ischemic changes. -Continue heart healthy diet  3-Allergic rhinitis: will start patient on claritin daily.  4-Dyslipidemia: will check lipid profile  5-Hypertension: Given ongoing respiratory tissue and wheezing will stop ACE inhibitor for potentiality of bronchospasm/bear weight reaction. -Patient will be started on amlodipine 5 mg by mouth daily and will adjust as needed to control blood pressure -Low sodium diet  DVT: Heparin.   Code Status: Full Family Communication: Wife at bedside Disposition Plan: LOS > 2 midnights, inpatient; med-surg bed  Time spent: 68 minutes  Barton Dubois [facility] Pager [phone]  **Disclaimer: This note may have been dictated with voice recognition software. Similar sounding words can inadvertently be transcribed and this note may contain transcription errors which may not have been corrected upon publication of note.**

## 2013-07-12 DIAGNOSIS — I25810 Atherosclerosis of coronary artery bypass graft(s) without angina pectoris: Secondary | ICD-10-CM

## 2013-07-12 LAB — GLUCOSE, CAPILLARY: Glucose-Capillary: 180 mg/dL — ABNORMAL HIGH (ref 70–99)

## 2013-07-12 LAB — STREP PNEUMONIAE URINARY ANTIGEN: Strep Pneumo Urinary Antigen: NEGATIVE

## 2013-07-12 LAB — LIPID PANEL
Cholesterol: 176 mg/dL (ref 0–200)
HDL: 24 mg/dL — ABNORMAL LOW (ref >39)
LDL Cholesterol: 84 mg/dL (ref 0–99)
Total CHOL/HDL Ratio: 7.3 RATIO
Triglycerides: 338 mg/dL — ABNORMAL HIGH (ref <150)
VLDL: 68 mg/dL — ABNORMAL HIGH (ref 0–40)

## 2013-07-12 LAB — LEGIONELLA ANTIGEN, URINE: Legionella Antigen, Urine: NEGATIVE

## 2013-07-12 LAB — BASIC METABOLIC PANEL
BUN: 19 mg/dL (ref 6–23)
CO2: 23 mEq/L (ref 19–32)
Calcium: 8.6 mg/dL (ref 8.4–10.5)
Chloride: 100 mEq/L (ref 96–112)
Creatinine, Ser: 0.95 mg/dL (ref 0.50–1.35)
GFR calc Af Amer: >90 mL/min (ref >90)
GFR calc non Af Amer: 81 mL/min — ABNORMAL LOW (ref >90)
Glucose, Bld: 109 mg/dL — ABNORMAL HIGH (ref 70–99)
Potassium: 4.3 mEq/L (ref 3.7–5.3)
Sodium: 137 mEq/L (ref 137–147)

## 2013-07-12 LAB — HIV ANTIBODY (ROUTINE TESTING W REFLEX): HIV 1&2 Ab, 4th Generation: NONREACTIVE

## 2013-07-12 LAB — CBC
HCT: 36.7 % — ABNORMAL LOW (ref 39.0–52.0)
Hemoglobin: 12.2 g/dL — ABNORMAL LOW (ref 13.0–17.0)
MCH: 29.5 pg (ref 26.0–34.0)
MCHC: 33.2 g/dL (ref 30.0–36.0)
MCV: 88.9 fL (ref 78.0–100.0)
Platelets: 167 10*3/uL (ref 150–400)
RBC: 4.13 MIL/uL — ABNORMAL LOW (ref 4.22–5.81)
RDW: 14.2 % (ref 11.5–15.5)
WBC: 12.5 10*3/uL — ABNORMAL HIGH (ref 4.0–10.5)

## 2013-07-12 LAB — TSH: TSH: 2.2 u[IU]/mL (ref 0.350–4.500)

## 2013-07-12 LAB — EXPECTORATED SPUTUM ASSESSMENT W GRAM STAIN, RFLX TO RESP C

## 2013-07-12 LAB — PRO B NATRIURETIC PEPTIDE: Pro B Natriuretic peptide (BNP): 43.8 pg/mL (ref 0–125)

## 2013-07-12 MED ORDER — HEPARIN SODIUM (PORCINE) 5000 UNIT/ML IJ SOLN
5000.0000 [IU] | Freq: Three times a day (TID) | INTRAMUSCULAR | Status: DC
  Administered 2013-07-12 – 2013-07-13 (×5): 5000 [IU] via SUBCUTANEOUS
  Filled 2013-07-12 (×8): qty 1

## 2013-07-12 MED ORDER — BUDESONIDE 0.25 MG/2ML IN SUSP
0.2500 mg | Freq: Two times a day (BID) | RESPIRATORY_TRACT | Status: DC
  Administered 2013-07-12 – 2013-07-13 (×3): 0.25 mg via RESPIRATORY_TRACT
  Filled 2013-07-12 (×8): qty 2

## 2013-07-12 MED ORDER — SODIUM CHLORIDE 0.9 % IJ SOLN: 3.0000 mL | INTRAMUSCULAR | Status: DC | PRN

## 2013-07-12 MED ORDER — SODIUM CHLORIDE 0.9 % IV SOLN: 250.0000 mL | INTRAVENOUS | Status: DC | PRN

## 2013-07-12 MED ORDER — DOXYCYCLINE HYCLATE 100 MG IV SOLR
100.0000 mg | Freq: Two times a day (BID) | INTRAVENOUS | Status: DC
  Administered 2013-07-12 – 2013-07-13 (×4): 100 mg via INTRAVENOUS
  Filled 2013-07-12 (×4): qty 100

## 2013-07-12 MED ORDER — DEXTROSE 5 % IV SOLN
2.0000 g | Freq: Three times a day (TID) | INTRAVENOUS | Status: DC
  Administered 2013-07-12 – 2013-07-13 (×5): 2 g via INTRAVENOUS
  Filled 2013-07-12 (×6): qty 2

## 2013-07-12 MED ORDER — PNEUMOCOCCAL VAC POLYVALENT 25 MCG/0.5ML IJ INJ
0.5000 mL | INJECTION | INTRAMUSCULAR | Status: DC
  Filled 2013-07-12 (×2): qty 0.5

## 2013-07-12 MED ORDER — SODIUM CHLORIDE 0.9 % IJ SOLN
3.0000 mL | Freq: Two times a day (BID) | INTRAMUSCULAR | Status: DC
  Administered 2013-07-12: 3 mL via INTRAVENOUS

## 2013-07-12 NOTE — Progress Notes (Signed)
ANTIBIOTIC CONSULT NOTE - INITIAL  Pharmacy Consult for Pharmacy is consulted for antibiotic monitoring  Indication: pneumonia  Allergies  Allergen Reactions  . Penicillins Hives    Patient Measurements: Height: 6' (182.9 cm) (height pt stated Simultaneous filing. User may not have seen previous data.) Weight: 209 lb 9.6 oz (95.074 kg) (Simultaneous filing. User may not have seen previous data.) IBW/kg (Calculated) : 77.6 Adjusted Body Weight:   Vital Signs: Temp: 98 F (36.7 C) (07/01 0536) Temp src: Oral (07/01 0536) BP: 134/88 mmHg (07/01 0536) Pulse Rate: 80 (07/01 0536) Intake/Output from previous day: 06/30 0701 - 07/01 0700 In: 790 [P.O.:240; IV Piggyback:550] Out: -  Intake/Output from this shift: Total I/O In: 790 [P.O.:240; IV Piggyback:550] Out: -   Labs:  Recent Labs  07/11/13 1940  WBC 16.3*  HGB 13.6  PLT 177  CREATININE 1.07   Estimated Creatinine Clearance: 73.6 ml/min (by C-G formula based on Cr of 1.07). No results found for this basename: VANCOTROUGH, VANCOPEAK, VANCORANDOM, GENTTROUGH, GENTPEAK, GENTRANDOM, TOBRATROUGH, TOBRAPEAK, TOBRARND, AMIKACINPEAK, AMIKACINTROU, AMIKACIN,  in the last 72 hours   Microbiology: No results found for this or any previous visit (from the past 720 hour(s)).  Medical History: Past Medical History  Diagnosis Date  . Coronary disease     Status post stenting of the left circumflex coronary in 2009 with a bare-metal stent (with a 3.5x53mm Liberte stent)  . Dyslipidemia   . Hypertension     Poorly controlled  . Myocardial infarction     Medications:  Anti-infectives   Start     Dose/Rate Route Frequency Ordered Stop   07/12/13 0100  doxycycline (VIBRAMYCIN) 100 mg in dextrose 5 % 250 mL IVPB     100 mg 125 mL/hr over 120 Minutes Intravenous 2 times daily 07/12/13 0037     07/12/13 0037  aztreonam (AZACTAM) 2 g in dextrose 5 % 50 mL IVPB     2 g 100 mL/hr over 30 Minutes Intravenous 3 times per day  07/12/13 0037 07/19/13 2159   07/11/13 2300  vancomycin (VANCOCIN) 1,000 mg in sodium chloride 0.9 % 250 mL IVPB     1,000 mg 250 mL/hr over 60 Minutes Intravenous  Once 07/11/13 2249 07/12/13 0027   07/11/13 2300  ceFEPIme (MAXIPIME) 2 g in dextrose 5 % 50 mL IVPB  Status:  Discontinued     2 g 100 mL/hr over 30 Minutes Intravenous  Once 07/11/13 2249 07/12/13 0037     Assessment: Patient with PNA.  MD has ordered aztreonam 2gm iv q8hr and doxycycline 100mg  iv q12hr.  Patient with renal function >25mL/min  Goal of Therapy:  Pharmacy is consulted for antibiotic monitoring   Plan:  Follow up culture results No adjustment of antibiotics needed at this time  Nani Skillern Crowford 07/12/2013,6:14 AM

## 2013-07-12 NOTE — Progress Notes (Signed)
[facility] PROGRESS NOTE  Colin Lopez VOH:[id number] DOB: 06-03-1940 DOA: 07/11/2013 PCP: Webb Silversmith, NP  Assessment/Plan: 1. Community Acquired Pneumonia -Patient presenting with clinical signs and symptoms consistent with PNA, labs showing leukocytosis -CXR showing patchy infiltrate -Pt with history of penicillin allergy, was started on Aztreonam and Doxy. -Clinically improved, white count trending down on this morning's labs.   2.  Coronary Artery Disease -He denies CP, troponins on admission negative.  -Will add antiplatelet therapy with ASA  3.  HTN -Blood pressures are stable -Continue Norvasc 5 mg PO q daily    Code Status: Full Code Family Communication:  Disposition Plan: Continue IV AB's and supportive care   Antibiotics:  Aztreonam (started 07/11/2013)  Doxy (started 07/11/2013)  HPI/Subjective: Patient is a 73 y/o male with a PMH of HTN, CAD with percutaneous intervention to left circumflex, presenting with shortness of breath, productive cough and chills, with CXR showing persistent patchy infiltrates.   Objective: Filed Vitals:   07/12/13 0536  BP: 134/88  Pulse: 80  Temp: 98 F (36.7 C)  Resp:     Intake/Output Summary (Last 24 hours) at 07/12/13 1249 Last data filed at 07/12/13 6948  Gross per 24 hour  Intake   1150 ml  Output      0 ml  Net   1150 ml   Filed Weights   07/12/13 0050  Weight: 95.074 kg (209 lb 9.6 oz)    Exam:   General:  Patient is in no acute distress, awake and alert  Cardiovascular: Regular rate and rhythm, normal heart sounds, no edema  Respiratory: Has bilateral rhonchi, on supplemental oxygen   Abdomen: Soft nontender nondistended  Musculoskeletal: No edema   Data Reviewed: Basic Metabolic Panel:  Recent Labs Lab 07/11/13 1940 07/12/13 0525  NA 135* 137  K 4.4 4.3  CL 98 100  CO2 21 23  GLUCOSE 104* 109*  BUN 21 19  CREATININE 1.07 0.95  CALCIUM 8.7 8.6   Liver Function  Tests:  Recent Labs Lab 07/11/13 1940  AST 20  ALT 28  ALKPHOS 80  BILITOT 0.3  PROT 6.6  ALBUMIN 3.0*   No results found for this basename: LIPASE, AMYLASE,  in the last 168 hours No results found for this basename: AMMONIA,  in the last 168 hours CBC:  Recent Labs Lab 07/11/13 1940 07/12/13 0525  WBC 16.3* 12.5*  NEUTROABS 12.7*  --   HGB 13.6 12.2*  HCT 39.9 36.7*  MCV 88.9 88.9  PLT 177 167   Cardiac Enzymes: No results found for this basename: CKTOTAL, CKMB, CKMBINDEX, TROPONINI,  in the last 168 hours BNP (last 3 results)  Recent Labs  07/11/13 1940  PROBNP 43.8   CBG:  Recent Labs Lab 07/12/13 0900  GLUCAP 180*    Recent Results (from the past 240 hour(s))  CULTURE, BLOOD (ROUTINE X 2)     Status: None   Collection Time    07/11/13  7:40 PM      Result Value Ref Range Status   Specimen Description BLOOD LEFT FOREARM   Final   Special Requests BOTTLES DRAWN AEROBIC AND ANAEROBIC 5CC   Final   Culture  Setup Time     Final   Value: 07/11/2013 22:25     Performed at [facility]   Culture     Final   Value:        BLOOD CULTURE RECEIVED NO GROWTH TO DATE CULTURE WILL BE HELD FOR 5 DAYS  BEFORE ISSUING A FINAL NEGATIVE REPORT     Performed at [facility]   Report Status PENDING   Incomplete  CULTURE, BLOOD (ROUTINE X 2)     Status: None   Collection Time    07/11/13  7:43 PM      Result Value Ref Range Status   Specimen Description BLOOD LEFT ARM   Final   Special Requests BOTTLES DRAWN AEROBIC AND ANAEROBIC 5CC   Final   Culture  Setup Time     Final   Value: 07/11/2013 22:25     Performed at [facility]   Culture     Final   Value:        BLOOD CULTURE RECEIVED NO GROWTH TO DATE CULTURE WILL BE HELD FOR 5 DAYS BEFORE ISSUING A FINAL NEGATIVE REPORT     Performed at [facility]   Report Status PENDING   Incomplete  CULTURE, EXPECTORATED SPUTUM-ASSESSMENT     Status: None   Collection Time    07/12/13  9:52  AM      Result Value Ref Range Status   Specimen Description SPUTUM   Final   Special Requests NONE   Final   Sputum evaluation     Final   Value: MICROSCOPIC FINDINGS SUGGEST THAT THIS SPECIMEN IS NOT REPRESENTATIVE OF LOWER RESPIRATORY SECRETIONS. PLEASE RECOLLECT.     Leward Quan RN AT [phone] ON 07.01.115 BY SHUEA   Report Status 07/12/2013 FINAL   Final     Studies: Dg Chest 2 View  07/11/2013   CLINICAL DATA:  PNEUMONIA  EXAM: CHEST  2 VIEW  COMPARISON:  Prior radiograph from 07/04/2013  FINDINGS: The cardiac and mediastinal silhouettes are stable in size and contour, and remain within normal limits.  The lungs are normally inflated. Persistent patchy infiltrate seen within the retrocardiac left lower lobe, slightly improved from prior. No interval worsening or new focal infiltrates identified. No pneumothorax. No pulmonary edema or pleural effusion.  No acute osseous abnormality identified. Moderate multilevel degenerative changes seen within the visualized spine.  IMPRESSION: 1. Persistent patchy infiltrate within the retrocardiac left lower lobe, stable to slightly improved relative to prior. No interval worsening or new focal infiltrates identified. Please note that these changes can take up to 6 weeks to resolve.   Electronically Signed   By: Jeannine Boga M.D.   On: 07/11/2013 19:39   Ct Angio Chest Pe W/cm &/or Wo Cm  07/11/2013   CLINICAL DATA:  Shortness of breath and chest pain  EXAM: CT ANGIOGRAPHY CHEST WITH CONTRAST  TECHNIQUE: Multidetector CT imaging of the chest was performed using the standard protocol during bolus administration of intravenous contrast. Multiplanar CT image reconstructions and MIPs were obtained to evaluate the vascular anatomy.  CONTRAST:  151mL OMNIPAQUE IOHEXOL 350 MG/ML SOLN  COMPARISON:  07/11/2013, 09/17/2008  FINDINGS: There is mild medial right lower lobe atelectasis. There is heavy infiltrate throughout the inferior half of the left lower  lobe. There is mild bilateral lower lobe bronchiectasis.  There is no pleural or pericardial effusion. There are small presumably reactive mediastinal lymph nodes. There is coronary arterial calcification. There appears to be a circumflex artery stent. There is calcification of the abdominal aorta with no evidence of aortic dissection or dilatation.  There are no filling defects in the pulmonary arterial system. Scans through the upper abdomen are unremarkable. There are no acute musculoskeletal findings.  Review of the MIP images confirms the above findings.  IMPRESSION:  Left lower lobe pneumonia.  No evidence of pulmonary embolism.   Electronically Signed   By: Skipper Cliche M.D.   On: 07/11/2013 21:57    Scheduled Meds: . amLODipine  5 mg Oral Daily  . aztreonam  2 g Intravenous 3 times per day  . budesonide (PULMICORT) nebulizer solution  0.25 mg Nebulization BID  . doxycycline (VIBRAMYCIN) IV  100 mg Intravenous BID  . guaiFENesin  600 mg Oral BID  . heparin  5,000 Units Subcutaneous 3 times per day  . ipratropium-albuterol  3 mL Nebulization QID  . loratadine  10 mg Oral Daily  . [START ON 07/13/2013] pneumococcal 23 valent vaccine  0.5 mL Intramuscular Tomorrow-1000  . sodium chloride  3 mL Intravenous Q12H   Continuous Infusions:   Principal Problem:   Acute respiratory failure with hypoxia Active Problems:   Coronary disease   Dyslipidemia   Hypertension   CAP (community acquired pneumonia)    Time spent: 25 min    Kelvin Cellar  [facility] Pager [phone]. If 7PM-7AM, please contact night-coverage at [email or web address], password [facility] 07/12/2013, 12:49 PM  LOS: 1 day

## 2013-07-13 LAB — URINE CULTURE
Colony Count: NO GROWTH
Culture: NO GROWTH

## 2013-07-13 LAB — CBC
HCT: 38.2 % — ABNORMAL LOW (ref 39.0–52.0)
Hemoglobin: 12.4 g/dL — ABNORMAL LOW (ref 13.0–17.0)
MCH: 29.2 pg (ref 26.0–34.0)
MCHC: 32.5 g/dL (ref 30.0–36.0)
MCV: 89.9 fL (ref 78.0–100.0)
Platelets: 152 10*3/uL (ref 150–400)
RBC: 4.25 MIL/uL (ref 4.22–5.81)
RDW: 14 % (ref 11.5–15.5)
WBC: 12.8 10*3/uL — ABNORMAL HIGH (ref 4.0–10.5)

## 2013-07-13 LAB — BASIC METABOLIC PANEL
Anion gap: 14 (ref 5–15)
BUN: 16 mg/dL (ref 6–23)
CO2: 22 mEq/L (ref 19–32)
Calcium: 8.9 mg/dL (ref 8.4–10.5)
Chloride: 96 mEq/L (ref 96–112)
Creatinine, Ser: 0.87 mg/dL (ref 0.50–1.35)
GFR calc Af Amer: >90 mL/min (ref >90)
GFR calc non Af Amer: 84 mL/min — ABNORMAL LOW (ref >90)
Glucose, Bld: 111 mg/dL — ABNORMAL HIGH (ref 70–99)
Potassium: 4.3 mEq/L (ref 3.7–5.3)
Sodium: 132 mEq/L — ABNORMAL LOW (ref 137–147)

## 2013-07-13 MED ORDER — DOXYCYCLINE HYCLATE 50 MG PO CAPS: 50.0000 mg | ORAL_CAPSULE | Freq: Two times a day (BID) | ORAL | Status: DC

## 2013-07-13 MED ORDER — DOXYCYCLINE HYCLATE 100 MG IV SOLR: 100.0000 mg | Freq: Two times a day (BID) | INTRAVENOUS | Status: DC

## 2013-07-13 MED ORDER — DOXYCYCLINE HYCLATE 50 MG PO CAPS: 100.0000 mg | ORAL_CAPSULE | Freq: Two times a day (BID) | ORAL | Status: DC

## 2013-07-13 NOTE — Discharge Summary (Addendum)
Physician Discharge Summary  Colin Lopez NLZ:[id number] DOB: 08/26/1940 DOA: 07/11/2013  PCP: Webb Silversmith, NP  Admit date: 07/11/2013 Discharge date: 07/13/2013  Time spent: 35 minutes  Recommendations for Outpatient Follow-up:  1. Repeat CXR in 2 weeks to follow up on infiltrate   Discharge Diagnoses:  Principal Problem:   Acute respiratory failure with hypoxia Active Problems:   Coronary disease   Dyslipidemia   Hypertension   CAP (community acquired pneumonia)   Discharge Condition: Stable  Diet recommendation: Heart Healthy  Filed Weights   07/12/13 0050  Weight: 95.074 kg (209 lb 9.6 oz)    History of present illness:  Colin Lopez is a 73 y.o. male with past medical history significant for hypertension, dyslipidemia, coronary artery disease (status post metal stent of the left circumflex in 2009) and allergic rhinitis; came to the hospital complaining of shortness of breath, productive cough and chills/fevers. Patient reports that for the last 3 weeks or so he has been experiencing difficulty breathing with associated productive cough. Patient reports that initially he was treated at urgent care with tapering prednisone and inhalers course for bronchospasm/bronchitis; at that moment his symptoms worsen and he noticed development of chills/subjective fevers, treated with a course of antibiotics at PCP office. Patient reports minimal improvement on his symptoms with the use of antibiotics and during his followup visit he was a still with significant wheezing, hypoxia on room air and complaining of productive cough with intermittent episode of fevers. Prior to physician office advise patient to come to the ED for further evaluation and treatment. In the ED patient was found to be hypoxic (88-89 oxygen saturation on room air), tachypneic with a respiratory rate up to 24-26 at rest and elevated wbc's at 16,000. Chest x-ray and CT of the chest demonstrated left lower lobe  pneumonia. [facility] has been called to admit the patient for further evaluation and treatment.   Hospital Course:  Patient is a pleasant 73 y/o male with a past medical history of CAD, dyslipidemia, HTN, admitted to the medicine service on 07/11/2013 as he presented with complaints of cough, shortness of breath, sputum production. He was worked up with a CXR which showed persistent patchy infiltrate with the retrocardiac left lower lobe, compared to prior study on 07/04/2013. He was also found to be in acute respiratory failure evidenced by new oxygen requirement and respiratory rate of 24. He was started on emperic antibiotic therapy for community acquired pneumonia with IV Aztreonam and Doxy (having a PCN allergy). Over the following days he had significant clinical improvement as his oxygen of was weaned off by 07/13/2013. By this date he had been afebrile for 48 hours, tolerating PO intake, with labs showing a downward trend in white count (16, 300 on admission to 12,800 on day of discharge). He reported feeling well enough to go home, and was discharged in stable condition on 07/13/2013.    Discharge Exam: Filed Vitals:   07/13/13 0603  BP: 125/78  Pulse: 87  Temp: 98.3 F (36.8 C)  Resp: 18    General: Nontoxic, in no acute distress, awake and alert Cardiovascular: Regular rate and rhythm, normal S1S2 Respiratory: Clear to auscultation, normal inspiratory effort, off of supplemental oxygen.  Abdomen: Soft, nontender nondistended  Discharge Instructions You were cared for by a hospitalist during your hospital stay. If you have any questions about your discharge medications or the care you received while you were in the hospital after you are discharged, you can call the  unit and asked to speak with the hospitalist on call if the hospitalist that took care of you is not available. Once you are discharged, your primary care physician will handle any further medical issues. Please note  that NO REFILLS for any discharge medications will be authorized once you are discharged, as it is imperative that you return to your primary care physician (or establish a relationship with a primary care physician if you do not have one) for your aftercare needs so that they can reassess your need for medications and monitor your lab values.      Discharge Instructions   Call MD for:  difficulty breathing, headache or visual disturbances    Complete by:  As directed      Call MD for:  extreme fatigue    Complete by:  As directed      Call MD for:  persistant dizziness or light-headedness    Complete by:  As directed      Call MD for:  severe uncontrolled pain    Complete by:  As directed      Call MD for:  temperature >100.4    Complete by:  As directed      Diet - low sodium heart healthy    Complete by:  As directed      Increase activity slowly    Complete by:  As directed             Medication List         albuterol 108 (90 BASE) MCG/ACT inhaler  Commonly known as:  PROVENTIL HFA;VENTOLIN HFA  Inhale 2 puffs into the lungs every 4 (four) hours as needed for wheezing or shortness of breath.     albuterol (2.5 MG/3ML) 0.083% nebulizer solution  Commonly known as:  PROVENTIL  Take 3 mLs (2.5 mg total) by nebulization every 6 (six) hours as needed for wheezing or shortness of breath.     doxycycline 50 MG capsule  Commonly known as:  VIBRAMYCIN  Take 2 capsules (100 mg total) by mouth 2 (two) times daily.     KRILL OIL PO  Take 1 capsule by mouth daily.     lisinopril 10 MG tablet  Commonly known as:  PRINIVIL,ZESTRIL  Take 1 tablet (10 mg total) by mouth daily.       Allergies  Allergen Reactions  . Penicillins Hives   Follow-up Information   Follow up with Webb Silversmith, NP.   Specialty:  Internal Medicine   Contact information:   [address] [place] [address] [phone]        The results of significant diagnostics from this  hospitalization (including imaging, microbiology, ancillary and laboratory) are listed below for reference.    Significant Diagnostic Studies: Dg Chest 2 View  07/11/2013   CLINICAL DATA:  PNEUMONIA  EXAM: CHEST  2 VIEW  COMPARISON:  Prior radiograph from 07/04/2013  FINDINGS: The cardiac and mediastinal silhouettes are stable in size and contour, and remain within normal limits.  The lungs are normally inflated. Persistent patchy infiltrate seen within the retrocardiac left lower lobe, slightly improved from prior. No interval worsening or new focal infiltrates identified. No pneumothorax. No pulmonary edema or pleural effusion.  No acute osseous abnormality identified. Moderate multilevel degenerative changes seen within the visualized spine.  IMPRESSION: 1. Persistent patchy infiltrate within the retrocardiac left lower lobe, stable to slightly improved relative to prior. No interval worsening or new focal infiltrates identified. Please note that these changes  can take up to 6 weeks to resolve.   Electronically Signed   By: Jeannine Boga M.D.   On: 07/11/2013 19:39   Dg Chest 2 View  07/04/2013   CLINICAL DATA:  73 year old male with shortness of Breath congestion and cough. Initial encounter.  EXAM: CHEST  2 VIEW  COMPARISON:  None.  FINDINGS: Asymmetric and increased left lower lobe streaky pulmonary opacity. No pleural effusion. No pneumothorax or edema. Probable calcified granuloma near the left lung hilum on the frontal view. Normal cardiac size and mediastinal contours. Visualized tracheal air column is within normal limits. Flowing osteophytes in the spine. No acute osseous abnormality identified.  IMPRESSION: Suspect left lower lobe bronchopneumonia. Post treatment radiographs recommended to document resolution.  These results will be called to the ordering clinician or representative by the Radiologist Assistant, and communication documented in the PACS or zVision Dashboard.    Electronically Signed   By: Lars Pinks M.D.   On: 07/04/2013 15:13   Dg Chest 2 View  06/29/2013   CLINICAL DATA:  Shortness of breath.  EXAM: CHEST  2 VIEW  COMPARISON:  CT chest and PA and lateral chest 09/17/2008.  FINDINGS: The lungs are clear. Heart size is normal. No pneumothorax or pleural effusion.  IMPRESSION: No acute disease.   Electronically Signed   By: Inge Rise M.D.   On: 06/29/2013 11:56   Ct Angio Chest Pe W/cm &/or Wo Cm  07/11/2013   CLINICAL DATA:  Shortness of breath and chest pain  EXAM: CT ANGIOGRAPHY CHEST WITH CONTRAST  TECHNIQUE: Multidetector CT imaging of the chest was performed using the standard protocol during bolus administration of intravenous contrast. Multiplanar CT image reconstructions and MIPs were obtained to evaluate the vascular anatomy.  CONTRAST:  140mL OMNIPAQUE IOHEXOL 350 MG/ML SOLN  COMPARISON:  07/11/2013, 09/17/2008  FINDINGS: There is mild medial right lower lobe atelectasis. There is heavy infiltrate throughout the inferior half of the left lower lobe. There is mild bilateral lower lobe bronchiectasis.  There is no pleural or pericardial effusion. There are small presumably reactive mediastinal lymph nodes. There is coronary arterial calcification. There appears to be a circumflex artery stent. There is calcification of the abdominal aorta with no evidence of aortic dissection or dilatation.  There are no filling defects in the pulmonary arterial system. Scans through the upper abdomen are unremarkable. There are no acute musculoskeletal findings.  Review of the MIP images confirms the above findings.  IMPRESSION: Left lower lobe pneumonia.  No evidence of pulmonary embolism.   Electronically Signed   By: Skipper Cliche M.D.   On: 07/11/2013 21:57    Microbiology: Recent Results (from the past 240 hour(s))  CULTURE, BLOOD (ROUTINE X 2)     Status: None   Collection Time    07/11/13  7:40 PM      Result Value Ref Range Status   Specimen  Description BLOOD LEFT FOREARM   Final   Special Requests BOTTLES DRAWN AEROBIC AND ANAEROBIC 5CC   Final   Culture  Setup Time     Final   Value: 07/11/2013 22:25     Performed at [facility]   Culture     Final   Value:        BLOOD CULTURE RECEIVED NO GROWTH TO DATE CULTURE WILL BE HELD FOR 5 DAYS BEFORE ISSUING A FINAL NEGATIVE REPORT     Performed at [facility]   Report Status PENDING   Incomplete  CULTURE,  BLOOD (ROUTINE X 2)     Status: None   Collection Time    07/11/13  7:43 PM      Result Value Ref Range Status   Specimen Description BLOOD LEFT ARM   Final   Special Requests BOTTLES DRAWN AEROBIC AND ANAEROBIC 5CC   Final   Culture  Setup Time     Final   Value: 07/11/2013 22:25     Performed at [facility]   Culture     Final   Value:        BLOOD CULTURE RECEIVED NO GROWTH TO DATE CULTURE WILL BE HELD FOR 5 DAYS BEFORE ISSUING A FINAL NEGATIVE REPORT     Performed at [facility]   Report Status PENDING   Incomplete  URINE CULTURE     Status: None   Collection Time    07/11/13 10:53 PM      Result Value Ref Range Status   Specimen Description URINE, CLEAN CATCH   Final   Special Requests NONE   Final   Culture  Setup Time     Final   Value: 07/12/2013 04:46     Performed at [facility]     Final   Value: NO GROWTH     Performed at [facility]   Culture     Final   Value: NO GROWTH     Performed at [facility]   Report Status 07/13/2013 FINAL   Final  CULTURE, EXPECTORATED SPUTUM-ASSESSMENT     Status: None   Collection Time    07/12/13  9:52 AM      Result Value Ref Range Status   Specimen Description SPUTUM   Final   Special Requests NONE   Final   Sputum evaluation     Final   Value: MICROSCOPIC FINDINGS SUGGEST THAT THIS SPECIMEN IS NOT REPRESENTATIVE OF LOWER RESPIRATORY SECRETIONS. PLEASE RECOLLECT.     Leward Quan RN AT 1010 ON 07.01.115 BY SHUEA   Report Status  07/12/2013 FINAL   Final     Labs: Basic Metabolic Panel:  Recent Labs Lab 07/11/13 1940 07/12/13 0525 07/13/13 0500  NA 135* 137 132*  K 4.4 4.3 4.3  CL 98 100 96  CO2 21 23 22   GLUCOSE 104* 109* 111*  BUN 21 19 16   CREATININE 1.07 0.95 0.87  CALCIUM 8.7 8.6 8.9   Liver Function Tests:  Recent Labs Lab 07/11/13 1940  AST 20  ALT 28  ALKPHOS 80  BILITOT 0.3  PROT 6.6  ALBUMIN 3.0*   No results found for this basename: LIPASE, AMYLASE,  in the last 168 hours No results found for this basename: AMMONIA,  in the last 168 hours CBC:  Recent Labs Lab 07/11/13 1940 07/12/13 0525 07/13/13 0500  WBC 16.3* 12.5* 12.8*  NEUTROABS 12.7*  --   --   HGB 13.6 12.2* 12.4*  HCT 39.9 36.7* 38.2*  MCV 88.9 88.9 89.9  PLT 177 167 152   Cardiac Enzymes: No results found for this basename: CKTOTAL, CKMB, CKMBINDEX, TROPONINI,  in the last 168 hours BNP: BNP (last 3 results)  Recent Labs  07/11/13 1940  PROBNP 43.8   CBG:  Recent Labs Lab 07/12/13 0900  GLUCAP 180*       Signed:  Yatzari Jonsson  [facility] 07/13/2013, 1:04 PM

## 2013-07-13 NOTE — Progress Notes (Signed)
Discharge instructions given to pt, verbalized understanding. Left the unit in stable condition. 

## 2013-07-13 NOTE — ED Provider Notes (Signed)
Medical screening examination/treatment/procedure(s) were conducted as a shared visit with non-physician practitioner(s) and myself.  I personally evaluated the patient during the encounter.   EKG Interpretation None      Pt comes in with cc of cough. Recent visit to the ER - found to have PNA. Started on steroids and lovenox. Pt finished course of antibiotics, and feels that he has not improved - in fact feels that he has gotten worse. Saw urgent care/pcp today, and asked to come to the ER. Hypoxic, with clear lung exam. CT confims pna, will admit with vanc and cefepime started. Pt will be admitted.  Varney Biles, MD 07/13/13 [phone]

## 2013-07-17 LAB — CULTURE, BLOOD (ROUTINE X 2)
Culture: NO GROWTH
Culture: NO GROWTH

## 2013-07-19 ENCOUNTER — Ambulatory Visit (INDEPENDENT_AMBULATORY_CARE_PROVIDER_SITE_OTHER)
Admission: RE | Admit: 2013-07-19 | Discharge: 2013-07-19 | Disposition: A | Discharge status: Home / Self Care | Payer: Medicare Other | Source: Ambulatory Visit | Attending: Internal Medicine | Admitting: Internal Medicine

## 2013-07-19 ENCOUNTER — Encounter: Payer: Self-pay | Admitting: Internal Medicine

## 2013-07-19 ENCOUNTER — Ambulatory Visit (INDEPENDENT_AMBULATORY_CARE_PROVIDER_SITE_OTHER): Payer: Medicare Other | Admitting: Internal Medicine

## 2013-07-19 VITALS — BP 110/60 | HR 80 | Temp 99.0°F | Resp 18 | Wt 203.0 lb

## 2013-07-19 DIAGNOSIS — J189 Pneumonia, unspecified organism: Secondary | ICD-10-CM

## 2013-07-19 MED ORDER — HYDROCODONE-HOMATROPINE 5-1.5 MG/5ML PO SYRP: 5.0000 mL | ORAL_SOLUTION | Freq: Every evening | ORAL | Status: DC | PRN

## 2013-07-19 MED ORDER — LEVOFLOXACIN 500 MG PO TABS: 500.0000 mg | ORAL_TABLET | Freq: Every day | ORAL | Status: DC

## 2013-07-19 NOTE — Assessment & Plan Note (Addendum)
Recent hospitalization  Still on doxy but has ongoing symptoms--though seems to be slowly improving CXR still shows retrocardiac LLL infiltrate--will await radiology reading Will change to levaquin---could be atypical infection causing persistent symptoms (including sinus type symptoms) Cough syrup He will try probiotic while on the antibiotic

## 2013-07-19 NOTE — Progress Notes (Signed)
Subjective:    Patient ID: Colin Lopez, male    DOB: 1940/03/27, 73 y.o.   MRN: [id number]  HPI Wife is here  Reviewed his hospital records Discharged 7/2 Since discharge, he has still been coughing and getting stringy mucus Still feels weak Has headache and ear pain (congested) Achy all over as well  Has trouble lying down--makes his breathing worse SOB with any activity--but tried to do some yard work yesterday (just walking around with grandson)  Clearly better than when in hospital Then had gobs of sputum Has headaches now Still on the doxy from the hospital  Feels hot at times No fever Last time he took neb--he felt worse No wheezing  Current Outpatient Prescriptions on File Prior to Visit  Medication Sig Dispense Refill  . albuterol (PROVENTIL HFA;VENTOLIN HFA) 108 (90 BASE) MCG/ACT inhaler Inhale 2 puffs into the lungs every 4 (four) hours as needed for wheezing or shortness of breath.  1 Inhaler  0  . albuterol (PROVENTIL) (2.5 MG/3ML) 0.083% nebulizer solution Take 3 mLs (2.5 mg total) by nebulization every 6 (six) hours as needed for wheezing or shortness of breath.  150 mL  1  . KRILL OIL PO Take 1 capsule by mouth daily.       [name] lisinopril (PRINIVIL,ZESTRIL) 10 MG tablet Take 1 tablet (10 mg total) by mouth daily.  30 tablet  11   No current facility-administered medications on file prior to visit.    Allergies  Allergen Reactions  . Penicillins Hives    Past Medical History  Diagnosis Date  . Coronary disease     Status post stenting of the left circumflex coronary in 2009 with a bare-metal stent (with a 3.5x55mm Liberte stent)  . Dyslipidemia   . Hypertension     Poorly controlled  . Myocardial infarction     Past Surgical History  Procedure Laterality Date  . US echocardiography  09-21-08    EF 55-60%  . Cardiovascular stress test  10-03-08    EF 59%  . Coronary angioplasty with stent placement      Family History  Problem Relation Age  of Onset  . Cancer Mother   . Diabetes Mother   . Heart disease Mother     History   Social History  . Marital Status: Married    Spouse Name: N/A    Number of Children: 6  . Years of Education: N/A   Occupational History  . upholstery    Social History Main Topics  . Smoking status: Former Research scientist (life sciences)  . Smokeless tobacco: Never Used  . Alcohol Use: Yes     Comment: Rarely  . Drug Use: Not on file  . Sexual Activity: Not on file   Other Topics Concern  . Not on file   Social History Narrative  . No narrative on file   Review of Systems Stomach is sore Some loose stools lately Bruising in abdomen from lovenox Appetite is still off     Objective:   Physical Exam  Constitutional: He appears well-developed. No distress.  Coarse cough  HENT:  Mouth/Throat: Oropharynx is clear and moist. No oropharyngeal exudate.  No sinus tenderness TMs okay   Neck: Normal range of motion. Neck supple. No thyromegaly present.  Cardiovascular: Normal rate, regular rhythm and normal heart sounds.  Exam reveals no gallop.   No murmur heard. Pulmonary/Chest: Effort normal. No respiratory distress. He has no wheezes.  Not tight LL crackles  Abdominal: Soft. There is  no tenderness.  Musculoskeletal: He exhibits no edema.  Lymphadenopathy:    He has no cervical adenopathy.  Psychiatric: He has a normal mood and affect. His behavior is normal.          Assessment & Plan:

## 2013-07-19 NOTE — Progress Notes (Signed)
Pre visit review using our clinic review tool, if applicable. No additional management support is needed unless otherwise documented below in the visit note. 

## 2013-07-21 ENCOUNTER — Telehealth: Payer: Self-pay | Admitting: Internal Medicine

## 2013-07-21 NOTE — Telephone Encounter (Signed)
Noted, thanks!

## 2013-07-21 NOTE — Telephone Encounter (Signed)
Patient Information:  Caller Name: [name]  Phone: [phone]  Patient: Colin Lopez, Colin Lopez  Gender: Male  DOB: Nov 08, 1940  Age: 73 Years  PCP: Viviana Simpler [facility])  Office Follow Up:  Does the office need to follow up with this patient?: Yes  Instructions For The Office: Wife needs further instructions.  RN Note:  No appointments available this afternoon. Caller wants to know if they need to change medication or do they have to be seen first? Please contact wife at the number provided.  Symptoms  Reason For Call & Symptoms: Taking Levofloxacin daily for pneumonia. Has developed hives down the center of the back today. Severe itching noted. Reports small blisters.  Reviewed Health History In EMR: Yes  Reviewed Medications In EMR: Yes  Reviewed Allergies In EMR: Yes  Reviewed Surgeries / Procedures: Yes  Date of Onset of Symptoms: 07/21/2013  Guideline(s) Used:  Hives  Rash - Widespread on Drugs - Drug Reaction  Disposition Per Guideline:   Go to Office Now  Reason For Disposition Reached:   Rash looks like large or small blisters (i.e., fluid filled bubbles or sacs on the skin)  Advice Given:  N/A  Patient Will Follow Care Advice:  YES

## 2013-07-21 NOTE — Telephone Encounter (Signed)
Hold med, schedule for sat clinic. If worse before appt or has difficulty breathing, call 911 or go to ER

## 2013-07-21 NOTE — Telephone Encounter (Signed)
Colin Lopez notified as instructed; pt has not taken dose of antibiotic today and will hold antibiotic until seen at [facility] with further instructions. If pt condition changes or worsens pt will go to UC or ED prior to appt. Pt scheduled at [facility] 07/22/13 at 9:45 am.

## 2013-07-22 ENCOUNTER — Ambulatory Visit (INDEPENDENT_AMBULATORY_CARE_PROVIDER_SITE_OTHER): Payer: Medicare Other | Admitting: Family Medicine

## 2013-07-22 ENCOUNTER — Telehealth: Payer: Self-pay | Admitting: Internal Medicine

## 2013-07-22 ENCOUNTER — Encounter: Payer: Self-pay | Admitting: Family Medicine

## 2013-07-22 VITALS — BP 130/70 | HR 78 | Temp 98.3°F | Wt 206.0 lb

## 2013-07-22 DIAGNOSIS — J189 Pneumonia, unspecified organism: Secondary | ICD-10-CM

## 2013-07-22 MED ORDER — BENZONATATE 200 MG PO CAPS: 200.0000 mg | ORAL_CAPSULE | Freq: Three times a day (TID) | ORAL | Status: DC | PRN

## 2013-07-22 NOTE — Assessment & Plan Note (Signed)
With likely levaquin allergy now.  Stop levaquin.  CXRs reviewed, cleared infiltrate.  D/w pt. Dry cough but no inc in wob.  No sputum now.  Would stay off antibiotics now and use tessalon for cough.  Supportive care o/w.  Call PCP on Monday with update.  He agrees.  Okay for outpatient f/u.

## 2013-07-22 NOTE — Progress Notes (Signed)
Pre visit review using our clinic review tool, if applicable. No additional management support is needed unless otherwise documented below in the visit note.  Was admitted with CAP, was improving and then discharged.  D/c summary noted, once home still had a cough and saw Dr. Silvio Pate.  Had a productive cough at that point.  Repeat CXR was done- noted lack of infiltrate on over-read.  Was started on levaquin- took 2 doses.  Now cough is dry.  Was noted to still have fatigue and dec in appetite.  Still with dry cough.  Rash/hives around the second dose of levaquin, on back and chest, lessened today.  The rash itched.  No tongue or throat sx.  No other clear cause of the rash, other than the levaquin.  He isn't SOB now, talking in complete sentences.    Meds, vitals, and allergies reviewed.   ROS: See HPI.  Otherwise, noncontributory.  nad ncat Tm wnl Nasal and OP exam wnl Neck supple no LA No tongue swelling or lip edema No stridor rrr ctab No inc in wob no wheeze abd soft Skin with faint blanching small pink lesion on the back > anterior chest, no other new skin lesions. Ext well perfused.

## 2013-07-22 NOTE — Patient Instructions (Signed)
Use tessalon for the cough along with the syrup and try to get some rest.  Update Baity on Monday.  Take care.  Glad to see you.

## 2013-07-22 NOTE — Telephone Encounter (Signed)
Pt's wife called back in, rx of benzonatate 200mg  is not covered by pt's insurance.  Pt's wife wants to know can something else be called into [facility]?  Please advise.

## 2013-07-22 NOTE — Telephone Encounter (Signed)
That is the best option for what he has going on.  I would try to get a partial fill on the rx to see if it helps.  That may decrease the cost.

## 2013-07-22 NOTE — Telephone Encounter (Signed)
Pt wife return call gave her md response...Colin Lopez

## 2013-07-22 NOTE — Telephone Encounter (Signed)
Called pt wife no answer LMOM RTC ASAP...Colin Lopez

## 2013-07-31 ENCOUNTER — Ambulatory Visit (INDEPENDENT_AMBULATORY_CARE_PROVIDER_SITE_OTHER): Payer: Medicare Other | Admitting: Internal Medicine

## 2013-07-31 ENCOUNTER — Encounter: Payer: Self-pay | Admitting: Internal Medicine

## 2013-07-31 ENCOUNTER — Telehealth: Payer: Self-pay | Admitting: Internal Medicine

## 2013-07-31 VITALS — BP 126/66 | HR 78 | Temp 97.9°F | Ht 69.5 in | Wt 202.0 lb

## 2013-07-31 DIAGNOSIS — E785 Hyperlipidemia, unspecified: Secondary | ICD-10-CM

## 2013-07-31 DIAGNOSIS — I25810 Atherosclerosis of coronary artery bypass graft(s) without angina pectoris: Secondary | ICD-10-CM

## 2013-07-31 DIAGNOSIS — I10 Essential (primary) hypertension: Secondary | ICD-10-CM

## 2013-07-31 NOTE — Progress Notes (Signed)
HPI  Pt presents to the clinic today to establish care. He has not had a PCP in many years. He does see cardiology, Dr. [name].  He did have CAP recently, a very complicated course. He saw Dr. Linna Darner 6/23, started on atrovent, albuterol and levaquin. 6/30, worsening shortness of breath. Saw Dr. Damita Dunnings who advised him to go to the ER. Was admitted to [facility] 6/30-7/2, given IV antibiotics and discharged home on a course of Levaquin. After d/c, developed hives. Saw Dr. Damita Dunnings 7/8. Levaquin was stopped due to clinical improvement. Since that time, he reports that he has been doing well. Cough has improved. He is no longer short of breath or fatigued.  Flu: never Tetanus: 2007 Pneumovax: never Zostovax: never PSA Screening: more than 5 years ago Colon Screening: screening Eye Doctor: as needed Dentist: as needed  Past Medical History  Diagnosis Date  . Coronary disease     Status post stenting of the left circumflex coronary in 2009 with a bare-metal stent (with a 3.5x37mm Liberte stent)  . Dyslipidemia   . Hypertension     Poorly controlled  . Myocardial infarction   . Heart disease   . Hyperlipidemia     Current Outpatient Prescriptions  Medication Sig Dispense Refill  . albuterol (PROVENTIL HFA;VENTOLIN HFA) 108 (90 BASE) MCG/ACT inhaler Inhale 2 puffs into the lungs every 4 (four) hours as needed for wheezing or shortness of breath.  1 Inhaler  0  . albuterol (PROVENTIL) (2.5 MG/3ML) 0.083% nebulizer solution Take 3 mLs (2.5 mg total) by nebulization every 6 (six) hours as needed for wheezing or shortness of breath.  150 mL  1  . benzonatate (TESSALON) 200 MG capsule Take 1 capsule (200 mg total) by mouth 3 (three) times daily as needed.  30 capsule  1  . HYDROcodone-homatropine (HYCODAN) 5-1.5 MG/5ML syrup Take 5 mLs by mouth at bedtime as needed for cough.  120 mL  0  . KRILL OIL PO Take 1 capsule by mouth daily.       [name] lisinopril (PRINIVIL,ZESTRIL) 10 MG tablet Take 1 tablet  (10 mg total) by mouth daily.  30 tablet  11   No current facility-administered medications for this visit.    Allergies  Allergen Reactions  . Levaquin [Levofloxacin In D5w]     Rash  . Penicillins Hives    Family History  Problem Relation Age of Onset  . Diabetes Mother   . Heart disease Sister   . Cancer Brother     abdominal    History   Social History  . Marital Status: Married    Spouse Name: N/A    Number of Children: 6  . Years of Education: N/A   Occupational History  . upholstery    Social History Main Topics  . Smoking status: Never Smoker   . Smokeless tobacco: Never Used  . Alcohol Use: Yes     Comment: Rarely  . Drug Use: Not on file  . Sexual Activity: Not on file   Other Topics Concern  . Not on file   Social History Narrative  . No narrative on file    ROS:  Constitutional: Denies fever, malaise, fatigue, headache or abrupt weight changes.  Respiratory: Denies difficulty breathing, shortness of breath, cough or sputum production.   Cardiovascular: Denies chest pain, chest tightness, palpitations or swelling in the hands or feet.  Neurological: Denies dizziness, difficulty with memory, difficulty with speech or problems with balance and coordination.   No  other specific complaints in a complete review of systems (except as listed in HPI above).  PE:  BP 126/66  Pulse 78  Temp(Src) 97.9 F (36.6 C) (Oral)  Ht 5' 9.5" (1.765 m)  Wt 202 lb (91.627 kg)  BMI 29.41 kg/m2  SpO2 97% Wt Readings from Last 3 Encounters:  07/31/13 202 lb (91.627 kg)  07/22/13 206 lb (93.441 kg)  07/19/13 203 lb (92.08 kg)    General: Appears his stated age, well developed, well nourished in NAD. Cardiovascular: Normal rate and rhythm. S1,S2 noted.  No murmur, rubs or gallops noted. No JVD or BLE edema. No carotid bruits noted. Pulmonary/Chest: Normal effort and positive vesicular breath sounds. No respiratory distress. No wheezes, rales or ronchi noted.   Neurological: Alert and oriented. Cranial nerves II-XII grossly intact. Coordination normal. +DTRs bilaterally.      BMET    Component Value Date/Time   NA 132* 07/13/2013 0500   K 4.3 07/13/2013 0500   CL 96 07/13/2013 0500   CO2 22 07/13/2013 0500   GLUCOSE 111* 07/13/2013 0500   BUN 16 07/13/2013 0500   CREATININE 0.87 07/13/2013 0500   CALCIUM 8.9 07/13/2013 0500   GFRNONAA 84* 07/13/2013 0500   GFRAA >90 07/13/2013 0500    Lipid Panel     Component Value Date/Time   CHOL 176 07/12/2013 0525   TRIG 338* 07/12/2013 0525   HDL 24* 07/12/2013 0525   CHOLHDL 7.3 07/12/2013 0525   VLDL 68* 07/12/2013 0525   LDLCALC 84 07/12/2013 0525    CBC    Component Value Date/Time   WBC 12.8* 07/13/2013 0500   WBC 16.6* 07/04/2013 1213   RBC 4.25 07/13/2013 0500   RBC 4.64* 07/04/2013 1213   HGB 12.4* 07/13/2013 0500   HGB 13.7* 07/04/2013 1213   HCT 38.2* 07/13/2013 0500   HCT 43.4* 07/04/2013 1213   PLT 152 07/13/2013 0500   MCV 89.9 07/13/2013 0500   MCV 93.5 07/04/2013 1213   MCH 29.2 07/13/2013 0500   MCH 29.5 07/04/2013 1213   MCHC 32.5 07/13/2013 0500   MCHC 31.6* 07/04/2013 1213   RDW 14.0 07/13/2013 0500   LYMPHSABS 2.1 07/11/2013 1940   MONOABS 1.3* 07/11/2013 1940   EOSABS 0.2 07/11/2013 1940   BASOSABS 0.0 07/11/2013 1940      Assessment and Plan:  Preventative Health:  Advised him to return in 3 months for his pneumo and flu vaccines  CAP:  Now resolved Hospital notes, labs, imaging, other outpatient provider notes reviewed

## 2013-07-31 NOTE — Patient Instructions (Addendum)

## 2013-07-31 NOTE — Progress Notes (Signed)
Pre visit review using our clinic review tool, if applicable. No additional management support is needed unless otherwise documented below in the visit note. 

## 2013-07-31 NOTE — Assessment & Plan Note (Signed)
Recent lipid profile reviewed Elevated triglycerides Try fish oil 1000 mg TID with meals  Will recheck lipids in 6 months

## 2013-07-31 NOTE — Assessment & Plan Note (Signed)
Well controlled on current dose of lisinopril

## 2013-07-31 NOTE — Telephone Encounter (Signed)
Relevant patient education assigned to patient using Emmi. ° °

## 2013-07-31 NOTE — Assessment & Plan Note (Signed)
?   If he should be taking ASA Will defer to Dr. [name]

## 2013-09-25 ENCOUNTER — Other Ambulatory Visit: Payer: Self-pay | Admitting: Cardiology

## 2013-09-26 ENCOUNTER — Other Ambulatory Visit: Payer: Self-pay | Admitting: Cardiology

## 2013-10-20 ENCOUNTER — Encounter: Payer: Self-pay | Admitting: Cardiology

## 2013-10-20 ENCOUNTER — Telehealth: Payer: Self-pay | Admitting: Cardiology

## 2013-10-20 MED ORDER — LISINOPRIL 10 MG PO TABS
ORAL_TABLET | ORAL | Status: DC
Start: 2013-10-20 — End: 2014-03-23

## 2013-10-20 NOTE — Telephone Encounter (Signed)
Pt's wife called in stating that [name] needs a new prescription for his Lisinopril. The pharmacy said that he needed to see the doctor before having this refilled but Dr. [name] does not have any appts open until January. Please call  Thanks

## 2013-10-20 NOTE — Telephone Encounter (Signed)
NO ANSWER, LEFT MESSAGE ON PATIENT'S  CELL VOICEMAIL . WILL FILL RX UNTIL 02/2014

## 2013-10-20 NOTE — Telephone Encounter (Signed)
Returned call to patient's wife no answer.Left message to call me back to schedule appointment with Dr.Jordan.

## 2013-10-23 ENCOUNTER — Telehealth: Payer: Self-pay | Admitting: Cardiology

## 2013-10-23 NOTE — Telephone Encounter (Signed)
Colin Lopez was returning Cheryl's call from Friday. Please call back  Thanks

## 2013-10-24 NOTE — Telephone Encounter (Signed)
Returned call to patient's wife no answer.LMTC. 

## 2013-10-26 ENCOUNTER — Telehealth: Payer: Self-pay | Admitting: Cardiology

## 2013-10-26 NOTE — Telephone Encounter (Signed)
Returned call to patient's daughter she stated father's 11/10/13 appointment with Dr.Jordan was cancelled due to a schedule change.Stated she would like him seen before 01/2014.Appointment scheduled with Dr.Jordan 11/06/13 at 4:30 pm.Arrive at 4:15 pm.

## 2013-10-26 NOTE — Telephone Encounter (Signed)
See previous 10/26/13 note.

## 2013-10-26 NOTE — Telephone Encounter (Signed)
Colin Lopez is returning your call .[name] Thanks

## 2013-11-06 ENCOUNTER — Ambulatory Visit (INDEPENDENT_AMBULATORY_CARE_PROVIDER_SITE_OTHER): Payer: Medicare Other | Admitting: Cardiology

## 2013-11-06 ENCOUNTER — Ambulatory Visit: Payer: Medicare Other | Admitting: Cardiology

## 2013-11-06 ENCOUNTER — Encounter: Payer: Self-pay | Admitting: Cardiology

## 2013-11-06 VITALS — BP 140/90 | HR 72 | Ht 73.0 in | Wt 210.0 lb

## 2013-11-06 DIAGNOSIS — I2510 Atherosclerotic heart disease of native coronary artery without angina pectoris: Secondary | ICD-10-CM

## 2013-11-06 DIAGNOSIS — E785 Hyperlipidemia, unspecified: Secondary | ICD-10-CM

## 2013-11-06 DIAGNOSIS — I10 Essential (primary) hypertension: Secondary | ICD-10-CM

## 2013-11-06 MED ORDER — ASPIRIN EC 81 MG PO TBEC: 81.0000 mg | DELAYED_RELEASE_TABLET | Freq: Every day | ORAL | Status: DC

## 2013-11-06 NOTE — Patient Instructions (Signed)
Take ASA 81 mg daily  If you cannot tolerate this let me know.  Continue your other therapy  I will see you in one year.

## 2013-11-06 NOTE — Progress Notes (Signed)
Colin Lopez Date of Birth: [birth year] Medical Record #[id number]  History of Present Illness: [name] is seen for yearly followup today. He has a history of coronary disease with a non-ST elevation myocardial infarction in 2009. He underwent stenting of the left circumflex coronary with a 3.5 x 16 mm Liberte stent. He does have a history of hypertension and hyperlipidemia. He is intolerant to Crestor. On followup today he states he is feeling very well. He denies any chest pain or SOB. He quit taking ASA because it upsets his stomach. He was admitted in July 2015 with PNA. His only complaint is occasional indigestion.  Current Outpatient Prescriptions on File Prior to Visit  Medication Sig Dispense Refill  . lisinopril (PRINIVIL,ZESTRIL) 10 MG tablet TAKE ONE TABLET BY MOUTH ONCE DAILY  30 tablet  4   No current facility-administered medications on file prior to visit.    Allergies  Allergen Reactions  . Levaquin [Levofloxacin In D5w]     Rash  . Penicillins Hives    Past Medical History  Diagnosis Date  . Coronary disease     Status post stenting of the left circumflex coronary in 2009 with a bare-metal stent (with a 3.5x21mm Liberte stent)  . Dyslipidemia   . Hypertension     Poorly controlled  . Myocardial infarction   . Heart disease   . Hyperlipidemia     Past Surgical History  Procedure Laterality Date  . US echocardiography  09-21-08    EF 55-60%  . Cardiovascular stress test  10-03-08    EF 59%  . Coronary angioplasty with stent placement      History  Smoking status  . Never Smoker   Smokeless tobacco  . Never Used    History  Alcohol Use  . Yes    Comment: Rarely    Family History  Problem Relation Age of Onset  . Diabetes Mother   . Heart disease Sister   . Cancer Brother     abdominal    Review of Systems: As noted in history of present illness.  All other systems were reviewed and are negative.  Physical Exam: BP 140/90  Pulse 72   Ht 6\' 1"  (1.854 m)  Wt 210 lb (95.255 kg)  BMI 27.71 kg/m2 He is a WDWM in NAD. The HEENT exam is normal.  He is hard of hearing. The carotids are 2+ without bruits.  There is no thyromegaly.  There is no JVD.  The lungs are clear.    The heart exam reveals a regular rate with a normal S1 and S2.  There are no murmurs, gallops, or rubs.  The PMI is not displaced.   Abdominal exam reveals good bowel sounds.  .  There are no masses.  Exam of the legs reveal no clubbing, cyanosis, or edema. The distal pulses are intact.  Cranial nerves II - XII are intact.  Motor and sensory functions are intact.  The gait is normal.  LABORATORY DATA: ECG today demonstrates normal sinus rhythm with a normal ECG. Rate is 67 beats per minute. I have personally reviewed and interpreted this study.   Assessment / Plan: 1. Coronary disease with prior stenting of the left circumflex coronary in 2009 with a bare-metal stent. He remains asymptomatic. I have recommended resuming ASA at 81 mg daily. If he is unable to tolerate this then I would switch to Plavix 75 mg daily. Follow up in one year. 2. Hypertension-under fair control. 3. Dyslipidemia. Intolerant  to statins. Lipid panel in July showed predominantly elevated triglycerides. Continue fish oil and dietary modification.

## 2013-11-08 NOTE — Telephone Encounter (Signed)
See 10/26/13 note.

## 2013-11-10 ENCOUNTER — Ambulatory Visit: Payer: Medicare Other | Admitting: Cardiology

## 2014-03-12 ENCOUNTER — Ambulatory Visit (INDEPENDENT_AMBULATORY_CARE_PROVIDER_SITE_OTHER): Payer: Medicare Other | Admitting: Family Medicine

## 2014-03-12 ENCOUNTER — Ambulatory Visit: Payer: Self-pay | Admitting: Internal Medicine

## 2014-03-12 ENCOUNTER — Encounter: Payer: Self-pay | Admitting: Family Medicine

## 2014-03-12 VITALS — BP 136/86 | HR 92 | Temp 98.0°F | Wt 212.0 lb

## 2014-03-12 DIAGNOSIS — J019 Acute sinusitis, unspecified: Secondary | ICD-10-CM | POA: Insufficient documentation

## 2014-03-12 DIAGNOSIS — J0100 Acute maxillary sinusitis, unspecified: Secondary | ICD-10-CM | POA: Diagnosis not present

## 2014-03-12 MED ORDER — AZITHROMYCIN 250 MG PO TABS
ORAL_TABLET | ORAL | Status: DC
Start: 2014-03-12 — End: 2014-05-08

## 2014-03-12 NOTE — Assessment & Plan Note (Signed)
Recheck pulse ox okay.  D/w pt.  Nontoxic.  Start zmax, supportive care o/w.  Fu prn.  He agrees.

## 2014-03-12 NOTE — Progress Notes (Signed)
Pre visit review using our clinic review tool, if applicable. No additional management support is needed unless otherwise documented below in the visit note.  Sx started about 1.5 weeks ago.  Bloody rhinorrhea.  HA, facial pain. Cough, for the last few days.  No sputum usually, occ some throat clearing to get some white sputum up.  ST recently.  Chest is still clear, but h/o PNA noted.  No FCNAVD.  Voice is altered.    Meds, vitals, and allergies reviewed.   ROS: See HPI.  Otherwise, noncontributory.  GEN: nad, alert and oriented HEENT: mucous membranes moist, tm w/o erythema, nasal exam w/o erythema, clear discharge noted,  OP with cobblestoning NECK: supple w/o LA CV: rrr.   PULM: ctab, no inc wob EXT: no edema SKIN: no acute rash

## 2014-03-12 NOTE — Patient Instructions (Signed)
Start the zithromax today and try to get some rest.  Drink plenty of fluids in the meantime.  Glad to see you.

## 2014-03-23 ENCOUNTER — Other Ambulatory Visit: Payer: Self-pay

## 2014-03-23 MED ORDER — LISINOPRIL 10 MG PO TABS: ORAL_TABLET | ORAL | Status: DC

## 2014-05-08 ENCOUNTER — Encounter: Payer: Self-pay | Admitting: Internal Medicine

## 2014-05-08 ENCOUNTER — Ambulatory Visit (INDEPENDENT_AMBULATORY_CARE_PROVIDER_SITE_OTHER): Payer: Medicare Other | Admitting: Internal Medicine

## 2014-05-08 VITALS — BP 134/82 | Temp 97.8°F | Wt 208.0 lb

## 2014-05-08 DIAGNOSIS — G5791 Unspecified mononeuropathy of right lower limb: Secondary | ICD-10-CM | POA: Diagnosis not present

## 2014-05-08 DIAGNOSIS — G5793 Unspecified mononeuropathy of bilateral lower limbs: Secondary | ICD-10-CM

## 2014-05-08 DIAGNOSIS — Z79899 Other long term (current) drug therapy: Secondary | ICD-10-CM

## 2014-05-08 DIAGNOSIS — G5792 Unspecified mononeuropathy of left lower limb: Secondary | ICD-10-CM

## 2014-05-08 MED ORDER — GABAPENTIN 100 MG PO CAPS: 100.0000 mg | ORAL_CAPSULE | Freq: Every day | ORAL | Status: DC

## 2014-05-08 NOTE — Progress Notes (Signed)
Pre visit review using our clinic review tool, if applicable. No additional management support is needed unless otherwise documented below in the visit note. 

## 2014-05-08 NOTE — Progress Notes (Signed)
Subjective:    Patient ID: Colin Lopez, male    DOB: [birth year], 74 y.o.   MRN: [id number]  HPI  Pt presents to the clinic today with c/o pain in his feet. He reports this started 1-2 years ago but the symptoms have gotten worse recently. He describes the pain as tingling and throbbing. He reports it feels like he is standing on pins and needles. It is worse with walking. The pain is constant. The right foot seems worse than the left. He has not taken anything OTC. He has no history of diabetes but does have a family history of it. He denies any injury to the area.  Review of Systems      Past Medical History  Diagnosis Date  . Coronary disease     Status post stenting of the left circumflex coronary in 2009 with a bare-metal stent (with a 3.5x52mm Liberte stent)  . Dyslipidemia   . Hypertension     Poorly controlled  . Myocardial infarction   . Heart disease   . Hyperlipidemia     Current Outpatient Prescriptions  Medication Sig Dispense Refill  . aspirin EC 81 MG tablet Take 1 tablet (81 mg total) by mouth daily. 90 tablet 3  . lisinopril (PRINIVIL,ZESTRIL) 10 MG tablet TAKE ONE TABLET BY MOUTH ONCE DAILY 30 tablet 4  . Omega-3 Fatty Acids (FISH OIL BURP-LESS) 1000 MG CAPS Take 1 capsule by mouth 3 (three) times daily after meals.     No current facility-administered medications for this visit.    Allergies  Allergen Reactions  . Levaquin [Levofloxacin In D5w]     Rash  . Penicillins Hives    Family History  Problem Relation Age of Onset  . Diabetes Mother   . Heart disease Sister   . Cancer Brother     abdominal    History   Social History  . Marital Status: Married    Spouse Name: N/A  . Number of Children: 6  . Years of Education: N/A   Occupational History  . upholstery    Social History Main Topics  . Smoking status: Never Smoker   . Smokeless tobacco: Never Used  . Alcohol Use: Yes     Comment: Rarely  . Drug Use: No  . Sexual Activity:  Not Currently   Other Topics Concern  . Not on file   Social History Narrative     Constitutional: Denies fever, malaise, fatigue, headache or abrupt weight changes.  Respiratory: Denies difficulty breathing, shortness of breath, cough or sputum production.   Cardiovascular: Denies chest pain, chest tightness, palpitations or swelling in the hands or feet.  Musculoskeletal: Pt reports pain in his feet. Denies decrease in range of motion, difficulty with gait, muscle pain or joint pain and swelling.  Skin: Denies redness, rashes, lesions or ulcercations.  Neurological: Pt reports tingling sensation in his feet. Denies dizziness, difficulty with memory, difficulty with speech or problems with balance and coordination.   No other specific complaints in a complete review of systems (except as listed in HPI above).  Objective:   Physical Exam  BP 134/82 mmHg  Temp(Src) 97.8 F (36.6 C) (Oral)  Wt 208 lb (94.348 kg)  SpO2 96% Wt Readings from Last 3 Encounters:  05/08/14 208 lb (94.348 kg)  03/12/14 212 lb (96.163 kg)  11/06/13 210 lb (95.255 kg)    General: Appears his stated age, obese in NAD. Skin: Warm, dry and intact. No rashes, lesions or ulcerations noted.  Cardiovascular: Normal rate and rhythm. S1,S2 noted.  No murmur, rubs or gallops noted.  Pulmonary/Chest: Normal effort and positive vesicular breath sounds. No respiratory distress. No wheezes, rales or ronchi noted.  Musculoskeletal: Normal flexion and extension of the ankles. No signs of joint swelling. No difficulty with gait.  Neurological: Alert and oriented. Sensation intact to BLE but reports pain with palpation.  BMET    Component Value Date/Time   NA 132* 07/13/2013 0500   NA 138 11/24/2012 1910   K 4.3 07/13/2013 0500   K 3.8 11/24/2012 1910   CL 96 07/13/2013 0500   CL 106 11/24/2012 1910   CO2 22 07/13/2013 0500   CO2 28 11/24/2012 1910   GLUCOSE 111* 07/13/2013 0500   GLUCOSE 119* 11/24/2012 1910     BUN 16 07/13/2013 0500   BUN 20* 11/24/2012 1910   CREATININE 0.87 07/13/2013 0500   CREATININE 0.95 11/24/2012 1910   CALCIUM 8.9 07/13/2013 0500   CALCIUM 8.8 11/24/2012 1910   GFRNONAA 84* 07/13/2013 0500   GFRNONAA >60 11/24/2012 1910   GFRAA >90 07/13/2013 0500   GFRAA >60 11/24/2012 1910    Lipid Panel     Component Value Date/Time   CHOL 176 07/12/2013 0525   TRIG 338* 07/12/2013 0525   HDL 24* 07/12/2013 0525   CHOLHDL 7.3 07/12/2013 0525   VLDL 68* 07/12/2013 0525   LDLCALC 84 07/12/2013 0525    CBC    Component Value Date/Time   WBC 12.8* 07/13/2013 0500   WBC 16.6* 07/04/2013 1213   WBC 7.9 11/24/2012 1910   RBC 4.25 07/13/2013 0500   RBC 4.64* 07/04/2013 1213   RBC 5.08 11/24/2012 1910   HGB 12.4* 07/13/2013 0500   HGB 13.7* 07/04/2013 1213   HGB 15.1 11/24/2012 1910   HCT 38.2* 07/13/2013 0500   HCT 43.4* 07/04/2013 1213   HCT 44.0 11/24/2012 1910   PLT 152 07/13/2013 0500   PLT 147* 11/24/2012 1910   MCV 89.9 07/13/2013 0500   MCV 93.5 07/04/2013 1213   MCV 87 11/24/2012 1910   MCH 29.2 07/13/2013 0500   MCH 29.5 07/04/2013 1213   MCH 29.8 11/24/2012 1910   MCHC 32.5 07/13/2013 0500   MCHC 31.6* 07/04/2013 1213   MCHC 34.4 11/24/2012 1910   RDW 14.0 07/13/2013 0500   RDW 14.8* 11/24/2012 1910   LYMPHSABS 2.1 07/11/2013 1940   MONOABS 1.3* 07/11/2013 1940   EOSABS 0.2 07/11/2013 1940   BASOSABS 0.0 07/11/2013 1940    Hgb A1C No results found for: HGBA1C       Assessment & Plan:   Peripheral Neuropathy:  Will check A1C and B12 today Will start Neurontin 100 mg QHS, he will update me in 2 weeks to let me know how he is doing Avoid walking around without shoes on  Will follow up in 1 month or sooner if needed

## 2014-05-08 NOTE — Patient Instructions (Signed)

## 2014-05-09 LAB — HEMOGLOBIN A1C: Hgb A1c MFr Bld: 5.6 % (ref 4.6–6.5)

## 2014-05-09 LAB — VITAMIN B12: Vitamin B-12: 382 pg/mL (ref 211–911)

## 2014-08-20 ENCOUNTER — Other Ambulatory Visit: Payer: Self-pay | Admitting: Cardiology

## 2014-09-11 DIAGNOSIS — X32XXXD Exposure to sunlight, subsequent encounter: Secondary | ICD-10-CM | POA: Diagnosis not present

## 2014-09-11 DIAGNOSIS — L918 Other hypertrophic disorders of the skin: Secondary | ICD-10-CM | POA: Diagnosis not present

## 2014-09-11 DIAGNOSIS — L570 Actinic keratosis: Secondary | ICD-10-CM | POA: Diagnosis not present

## 2014-09-13 ENCOUNTER — Ambulatory Visit: Payer: Medicare Other | Admitting: Physician Assistant

## 2014-10-25 ENCOUNTER — Other Ambulatory Visit: Payer: Self-pay | Admitting: Cardiology

## 2014-12-03 ENCOUNTER — Telehealth: Payer: Self-pay | Admitting: Family Medicine

## 2014-12-03 ENCOUNTER — Telehealth: Payer: Self-pay | Admitting: Internal Medicine

## 2014-12-03 NOTE — Telephone Encounter (Signed)
Call pt:  How is he feeling today? If worse, he should go to UC unless he can make an appt for tomorrow. Not sure why he wanted to see Dr. Damita Dunnings?

## 2014-12-03 NOTE — Telephone Encounter (Signed)
Patient's wife called back.  She asked to be called back as soon as possible about the message she left earlier today with [employer].

## 2014-12-03 NOTE — Telephone Encounter (Signed)
Patient Name: Colin Lopez  DOB: [birth year]    Initial Comment Caller states husband has pain in left side, waist toward back; feels like hot poker; if no answer call 2nd number;    Nurse Assessment  Nurse: Raphael Gibney, RN, Vanita Ingles Date/Time (Eastern Time): 12/03/2014 10:30:22 AM  Confirm and document reason for call. If symptomatic, describe symptoms. ---Caller states that spouse has severe pain in his left side around his waist going to his back. Has tried pain reliever that does not work. Pain started yesterday early am. Pain is severe and kept him awake all night.  Has the patient traveled out of the country within the last 30 days? ---Not Applicable  Does the patient have any new or worsening symptoms? ---Yes  Will a triage be completed? ---Yes  Related visit to physician within the last 2 weeks? ---No  Does the PT have any chronic conditions? (i.e. diabetes, asthma, etc.) ---Yes  List chronic conditions. ---HTN; heart problems  Is this a behavioral health call? ---No     Guidelines    Guideline Title Affirmed Question Affirmed Notes  Flank Pain [1] SEVERE pain (e.g., excruciating, scale 8-10) AND [2] present > 1 hour    Final Disposition User   Go to ED Now Raphael Gibney, RN, Vera    Comments  Called primary number and recording states that the person I have called has a voice mail box that has not been set up yet. Will try secondary number  Called back line at office and spoke to Napoleon and advised that pt is having severe flank pain with ER outcome. Spouse states that pt does not want to go to the ER but wants to see Dr. Damita Dunnings in the office. States she spoke to [place] and she will give Dr. Damita Dunnings the message.   Referrals  GO TO FACILITY REFUSED   Disagree/Comply: Disagree  Disagree/Comply Reason: Disagree with instructions

## 2014-12-05 ENCOUNTER — Encounter: Payer: Self-pay | Admitting: Internal Medicine

## 2014-12-05 ENCOUNTER — Ambulatory Visit (INDEPENDENT_AMBULATORY_CARE_PROVIDER_SITE_OTHER): Payer: Medicare Other | Admitting: Internal Medicine

## 2014-12-05 VITALS — BP 126/84 | HR 105 | Temp 97.7°F | Wt 205.0 lb

## 2014-12-05 DIAGNOSIS — R350 Frequency of micturition: Secondary | ICD-10-CM

## 2014-12-05 DIAGNOSIS — M545 Low back pain, unspecified: Secondary | ICD-10-CM

## 2014-12-05 LAB — POCT URINALYSIS DIPSTICK
Bilirubin, UA: NEGATIVE
Blood, UA: NEGATIVE
Glucose, UA: NEGATIVE
Ketones, UA: NEGATIVE
Leukocytes, UA: NEGATIVE
Nitrite, UA: NEGATIVE
Protein, UA: NEGATIVE
Spec Grav, UA: >=1.03
Urobilinogen, UA: NEGATIVE
pH, UA: 5.5

## 2014-12-05 MED ORDER — TRAMADOL HCL 50 MG PO TABS: 50.0000 mg | ORAL_TABLET | Freq: Three times a day (TID) | ORAL | Status: DC | PRN

## 2014-12-05 MED ORDER — PREDNISONE 20 MG PO TABS: ORAL_TABLET | ORAL | Status: DC

## 2014-12-05 NOTE — Patient Instructions (Signed)

## 2014-12-05 NOTE — Telephone Encounter (Signed)
Called 12/04/14 and today with no answer and VM not set up

## 2014-12-05 NOTE — Progress Notes (Signed)
Subjective:    Patient ID: Colin Lopez, male    DOB: 03/17/1940, 74 y.o.   MRN: [id number]  HPI  Pt presents to the clinic today with c/o back pain. This started 4 days ago. The pain is in his left lower back. He describes the pain as a "hot poker". The pain radiates around the front of his abdomen. The pain is worse with going from a sitting to a standing position and with walking. He denies numbness and tingling down his legs (he does have neuropathy in his feet and he takes Gabapentin). He denies any back injury. He denies loss of bowel or bladder. He has tried Ibuprofen and heat with some relief. He has an appt with orthopedics 12/13. He reports he did get up 11 times at night to urinate but this is normal for him. He denies urgency, dysuria or blood in his urine. His bowels are moving normally.  Review of Systems      Past Medical History  Diagnosis Date  . Coronary disease     Status post stenting of the left circumflex coronary in 2009 with a bare-metal stent (with a 3.5x48mm Liberte stent)  . Dyslipidemia   . Hypertension     Poorly controlled  . Myocardial infarction ([place])   . Heart disease   . Hyperlipidemia     Current Outpatient Prescriptions  Medication Sig Dispense Refill  . aspirin EC 81 MG tablet Take 1 tablet (81 mg total) by mouth daily. 90 tablet 3  . gabapentin (NEURONTIN) 100 MG capsule Take 1 capsule (100 mg total) by mouth at bedtime. 30 capsule 1  . lisinopril (PRINIVIL,ZESTRIL) 10 MG tablet Take 1 tablet (10 mg total) by mouth daily. 30 tablet 6  . Omega-3 Fatty Acids (FISH OIL BURP-LESS) 1000 MG CAPS Take 1 capsule by mouth 3 (three) times daily after meals.     No current facility-administered medications for this visit.    Allergies  Allergen Reactions  . Levaquin [Levofloxacin In D5w]     Rash  . Penicillins Hives    Family History  Problem Relation Age of Onset  . Diabetes Mother   . Heart disease Sister   . Cancer Brother     abdominal     Social History   Social History  . Marital Status: Married    Spouse Name: N/A  . Number of Children: 6  . Years of Education: N/A   Occupational History  . upholstery    Social History Main Topics  . Smoking status: Never Smoker   . Smokeless tobacco: Never Used  . Alcohol Use: Yes     Comment: Rarely  . Drug Use: No  . Sexual Activity: Not Currently   Other Topics Concern  . Not on file   Social History Narrative     Constitutional: Denies fever, malaise, fatigue, headache or abrupt weight changes.  Gastrointestinal: Denies abdominal pain, bloating, constipation, diarrhea or blood in the stool.  GU: Pt reports urinary frequency. Denies urgency, pain with urination, burning sensation, blood in urine, odor or discharge. Musculoskeletal: Pt reports back pain. Denies  difficulty with gait,or joint pain and swelling.  Skin: Denies redness, rashes, lesions or ulcercations.  Neurological: Denies dizziness, difficulty with memory, difficulty with speech or problems with balance and coordination.  .  No other specific complaints in a complete review of systems (except as listed in HPI above).  Objective:   Physical Exam   BP 126/84 mmHg  Pulse 105  Temp(Src) 97.7 F (36.5 C) (Oral)  Wt 205 lb (92.987 kg)  SpO2 97%  Wt Readings from Last 3 Encounters:  12/05/14 205 lb (92.987 kg)  05/08/14 208 lb (94.348 kg)  03/12/14 212 lb (96.163 kg)    General: Appears his stated age, obese in NAD.  Cardiovascular: Normal rate and rhythm. S1,S2 noted.  No murmur, rubs or gallops noted.  Pulmonary/Chest: Normal effort and positive vesicular breath sounds. No respiratory distress. No wheezes, rales or ronchi noted.  Abdomen: Soft and nontender. No CVA tenderness noted. Musculoskeletal: Normal flexion and extension. Decreased rotation to the right. Pain with palpation over the lumbar spine. Strength 5/5 BLE. No difficulty with gait.  Neurological: Alert and oriented. Negative  SLR.   BMET    Component Value Date/Time   NA 132* 07/13/2013 0500   NA 138 11/24/2012 1910   K 4.3 07/13/2013 0500   K 3.8 11/24/2012 1910   CL 96 07/13/2013 0500   CL 106 11/24/2012 1910   CO2 22 07/13/2013 0500   CO2 28 11/24/2012 1910   GLUCOSE 111* 07/13/2013 0500   GLUCOSE 119* 11/24/2012 1910   BUN 16 07/13/2013 0500   BUN 20* 11/24/2012 1910   CREATININE 0.87 07/13/2013 0500   CREATININE 0.95 11/24/2012 1910   CALCIUM 8.9 07/13/2013 0500   CALCIUM 8.8 11/24/2012 1910   GFRNONAA 84* 07/13/2013 0500   GFRNONAA >60 11/24/2012 1910   GFRAA >90 07/13/2013 0500   GFRAA >60 11/24/2012 1910    Lipid Panel     Component Value Date/Time   CHOL 176 07/12/2013 0525   TRIG 338* 07/12/2013 0525   HDL 24* 07/12/2013 0525   CHOLHDL 7.3 07/12/2013 0525   VLDL 68* 07/12/2013 0525   LDLCALC 84 07/12/2013 0525    CBC    Component Value Date/Time   WBC 12.8* 07/13/2013 0500   WBC 16.6* 07/04/2013 1213   WBC 7.9 11/24/2012 1910   RBC 4.25 07/13/2013 0500   RBC 4.64* 07/04/2013 1213   RBC 5.08 11/24/2012 1910   HGB 12.4* 07/13/2013 0500   HGB 13.7* 07/04/2013 1213   HGB 15.1 11/24/2012 1910   HCT 38.2* 07/13/2013 0500   HCT 43.4* 07/04/2013 1213   HCT 44.0 11/24/2012 1910   PLT 152 07/13/2013 0500   PLT 147* 11/24/2012 1910   MCV 89.9 07/13/2013 0500   MCV 93.5 07/04/2013 1213   MCV 87 11/24/2012 1910   MCH 29.2 07/13/2013 0500   MCH 29.5 07/04/2013 1213   MCH 29.8 11/24/2012 1910   MCHC 32.5 07/13/2013 0500   MCHC 31.6* 07/04/2013 1213   MCHC 34.4 11/24/2012 1910   RDW 14.0 07/13/2013 0500   RDW 14.8* 11/24/2012 1910   LYMPHSABS 2.1 07/11/2013 1940   MONOABS 1.3* 07/11/2013 1940   EOSABS 0.2 07/11/2013 1940   BASOSABS 0.0 07/11/2013 1940    Hgb A1C Lab Results  Component Value Date   HGBA1C 5.6 05/08/2014        Assessment & Plan:   Back pain:  Urinalysis normal Likely musculoskeletal eRx for Pred Taper RX  For Tramadol for severe pain Follow  up with Ortho if pain persist or worsen  RTC as needed or if symptoms persist or worsen

## 2014-12-05 NOTE — Telephone Encounter (Signed)
Pt has appt scheduled for today. ?

## 2014-12-05 NOTE — Telephone Encounter (Signed)
There is also a separate phone note--called yesterday and today---unable to lmovm as VM has not been set up

## 2014-12-05 NOTE — Progress Notes (Signed)
Pre visit review using our clinic review tool, if applicable. No additional management support is needed unless otherwise documented below in the visit note. 

## 2014-12-17 ENCOUNTER — Encounter: Payer: Self-pay | Admitting: Internal Medicine

## 2014-12-17 ENCOUNTER — Ambulatory Visit (INDEPENDENT_AMBULATORY_CARE_PROVIDER_SITE_OTHER): Payer: Medicare Other | Admitting: Internal Medicine

## 2014-12-17 VITALS — BP 140/74 | HR 101 | Temp 97.7°F | Ht 69.0 in | Wt 212.0 lb

## 2014-12-17 DIAGNOSIS — Z125 Encounter for screening for malignant neoplasm of prostate: Secondary | ICD-10-CM | POA: Diagnosis not present

## 2014-12-17 DIAGNOSIS — G4700 Insomnia, unspecified: Secondary | ICD-10-CM | POA: Diagnosis not present

## 2014-12-17 DIAGNOSIS — G5791 Unspecified mononeuropathy of right lower limb: Secondary | ICD-10-CM

## 2014-12-17 DIAGNOSIS — E785 Hyperlipidemia, unspecified: Secondary | ICD-10-CM

## 2014-12-17 DIAGNOSIS — M792 Neuralgia and neuritis, unspecified: Secondary | ICD-10-CM | POA: Insufficient documentation

## 2014-12-17 DIAGNOSIS — I10 Essential (primary) hypertension: Secondary | ICD-10-CM

## 2014-12-17 DIAGNOSIS — Z0001 Encounter for general adult medical examination with abnormal findings: Secondary | ICD-10-CM

## 2014-12-17 DIAGNOSIS — Z1211 Encounter for screening for malignant neoplasm of colon: Secondary | ICD-10-CM

## 2014-12-17 DIAGNOSIS — I2510 Atherosclerotic heart disease of native coronary artery without angina pectoris: Secondary | ICD-10-CM | POA: Diagnosis not present

## 2014-12-17 DIAGNOSIS — Z0000 Encounter for general adult medical examination without abnormal findings: Secondary | ICD-10-CM

## 2014-12-17 LAB — COMPREHENSIVE METABOLIC PANEL
ALT: 14 U/L (ref 0–53)
AST: 13 U/L (ref 0–37)
Albumin: 3.6 g/dL (ref 3.5–5.2)
Alkaline Phosphatase: 77 U/L (ref 39–117)
BUN: 19 mg/dL (ref 6–23)
CO2: 27 mEq/L (ref 19–32)
Calcium: 8.9 mg/dL (ref 8.4–10.5)
Chloride: 100 mEq/L (ref 96–112)
Creatinine, Ser: 0.95 mg/dL (ref 0.40–1.50)
GFR: 82.13 mL/min (ref >60.00)
Glucose, Bld: 120 mg/dL — ABNORMAL HIGH (ref 70–99)
Potassium: 4 mEq/L (ref 3.5–5.1)
Sodium: 136 mEq/L (ref 135–145)
Total Bilirubin: 0.5 mg/dL (ref 0.2–1.2)
Total Protein: 6 g/dL (ref 6.0–8.3)

## 2014-12-17 LAB — LIPID PANEL
Cholesterol: 283 mg/dL — ABNORMAL HIGH (ref 0–200)
HDL: 27.9 mg/dL — ABNORMAL LOW (ref >39.00)
Total CHOL/HDL Ratio: 10
Triglycerides: 1196 mg/dL — ABNORMAL HIGH (ref 0.0–149.0)

## 2014-12-17 LAB — CBC
HCT: 42.9 % (ref 39.0–52.0)
Hemoglobin: 14.5 g/dL (ref 13.0–17.0)
MCHC: 33.9 g/dL (ref 30.0–36.0)
MCV: 89 fl (ref 78.0–100.0)
Platelets: 148 10*3/uL — ABNORMAL LOW (ref 150.0–400.0)
RBC: 4.81 Mil/uL (ref 4.22–5.81)
RDW: 14.3 % (ref 11.5–15.5)
WBC: 10.9 10*3/uL — ABNORMAL HIGH (ref 4.0–10.5)

## 2014-12-17 LAB — HEMOGLOBIN A1C: Hgb A1c MFr Bld: 5.7 % (ref 4.6–6.5)

## 2014-12-17 LAB — LDL CHOLESTEROL, DIRECT: Direct LDL: 74 mg/dL

## 2014-12-17 LAB — PSA, MEDICARE: PSA: 0.27 ng/ml (ref 0.10–4.00)

## 2014-12-17 MED ORDER — GABAPENTIN 100 MG PO CAPS: 100.0000 mg | ORAL_CAPSULE | Freq: Three times a day (TID) | ORAL | Status: DC

## 2014-12-17 NOTE — Progress Notes (Signed)
HPI:  Pt presents to the clinic today for his Medicare Wellness. He is also due for 6 month follow up of chronic conditions.  HLD with CAD with MI, s/p stent: His last LDL was 84. He is not on any cholesterol lowering medications. He does take Fish Oil daily as well as a baby ASA. He has an upcoming appt with Dr. [name] 12/15.  HTN: BP well controlled on Lisinopril. His BP today is 140/74. ECG from 10/2013 reviewed.  He also c/o ongoing "pins and needles sensation" in his right foot. He never picked up the Neurontin given to him at his last visit. He has decided he would like to try it.  He also reports insomnia. He is able to fall asleep but he has not been able to stay asleep. He has not tried anything OTC for this.  Past Medical History  Diagnosis Date  . Coronary disease     Status post stenting of the left circumflex coronary in 2009 with a bare-metal stent (with a 3.5x23mm Liberte stent)  . Dyslipidemia   . Hypertension     Poorly controlled  . Myocardial infarction ([place])   . Heart disease   . Hyperlipidemia     Current Outpatient Prescriptions  Medication Sig Dispense Refill  . aspirin EC 81 MG tablet Take 1 tablet (81 mg total) by mouth daily. 90 tablet 3  . lisinopril (PRINIVIL,ZESTRIL) 10 MG tablet Take 1 tablet (10 mg total) by mouth daily. 30 tablet 6  . Omega-3 Fatty Acids (FISH OIL BURP-LESS) 1000 MG CAPS Take 1 capsule by mouth 3 (three) times daily after meals.    . predniSONE (DELTASONE) 20 MG tablet Take 2 tabs on days 1-4, take 1 tab on days 5-8 12 tablet 0  . traMADol (ULTRAM) 50 MG tablet Take 1 tablet (50 mg total) by mouth every 8 (eight) hours as needed. 30 tablet 0   No current facility-administered medications for this visit.    Allergies  Allergen Reactions  . Levaquin [Levofloxacin In D5w]     Rash  . Penicillins Hives    Family History  Problem Relation Age of Onset  . Diabetes Mother   . Heart disease Sister   . Cancer Brother     abdominal     Social History   Social History  . Marital Status: Married    Spouse Name: N/A  . Number of Children: 6  . Years of Education: N/A   Occupational History  . upholstery    Social History Main Topics  . Smoking status: Never Smoker   . Smokeless tobacco: Never Used  . Alcohol Use: Yes     Comment: Rarely  . Drug Use: No  . Sexual Activity: Not Currently   Other Topics Concern  . Not on file   Social History Narrative    Hospitiliaztions: None  Health Maintenance:    Flu: never  Tetanus: 2007  Pneumovax: never  Prevnar: never  Zostavax: never  PSA: > 2 years ago  Bone Density: never  Colon Screening: never  Eye Doctor: as needed  Dental Exam: as needed   Providers:   PCP: Webb Silversmith, NP-C   Cardiologist: Dr. [name]  Neurosurgeon: Dr. Lovenia Shuck     I have personally reviewed and have noted:  1. The patient's medical and social history 2. Their use of alcohol, tobacco or illicit drugs 3. Their current medications and supplements 4. The patient's functional ability including ADL's, fall risks, home safety  risks and  hearing or visual impairment. 5. Diet and physical activities 6. Evidence for depression or mood disorder  Subjective:   Review of Systems:   Constitutional: Denies fever, malaise, fatigue, headache or abrupt weight changes.  Respiratory: Denies difficulty breathing, shortness of breath, cough or sputum production.   Cardiovascular: Denies chest pain, chest tightness, palpitations or swelling in the hands or feet.   Skin: Denies redness, rashes, lesions or ulcercations.  Neurological: Pt reports right foot paresthesia. Denies dizziness, difficulty with memory, difficulty with speech or problems with balance and coordination.  Psych: Denies anxiety, depression, SI/HI.  No other specific complaints in a complete review of systems (except as listed in HPI above).  Objective:  PE:   BP 140/74 mmHg  Pulse 101  Temp(Src) 97.7 F (36.5  C) (Oral)  Ht 5\' 9"  (1.753 m)  Wt 212 lb (96.163 kg)  BMI 31.29 kg/m2  SpO2 97%  Wt Readings from Last 3 Encounters:  12/05/14 205 lb (92.987 kg)  05/08/14 208 lb (94.348 kg)  03/12/14 212 lb (96.163 kg)    General: Appears his stated age, obese in NAD. Cardiovascular: Normal rate and rhythm. S1,S2 noted.  No murmur, rubs or gallops noted.  Pulmonary/Chest: Normal effort and positive vesicular breath sounds. No respiratory distress. No wheezes, rales or ronchi noted.  Neurological: Alert and oriented. Sensation intact to BLE. Psychiatric: Mood and affect normal. Behavior is normal. Judgment and thought content normal.    BMET    Component Value Date/Time   NA 132* 07/13/2013 0500   NA 138 11/24/2012 1910   K 4.3 07/13/2013 0500   K 3.8 11/24/2012 1910   CL 96 07/13/2013 0500   CL 106 11/24/2012 1910   CO2 22 07/13/2013 0500   CO2 28 11/24/2012 1910   GLUCOSE 111* 07/13/2013 0500   GLUCOSE 119* 11/24/2012 1910   BUN 16 07/13/2013 0500   BUN 20* 11/24/2012 1910   CREATININE 0.87 07/13/2013 0500   CREATININE 0.95 11/24/2012 1910   CALCIUM 8.9 07/13/2013 0500   CALCIUM 8.8 11/24/2012 1910   GFRNONAA 84* 07/13/2013 0500   GFRNONAA >60 11/24/2012 1910   GFRAA >90 07/13/2013 0500   GFRAA >60 11/24/2012 1910    Lipid Panel     Component Value Date/Time   CHOL 176 07/12/2013 0525   TRIG 338* 07/12/2013 0525   HDL 24* 07/12/2013 0525   CHOLHDL 7.3 07/12/2013 0525   VLDL 68* 07/12/2013 0525   LDLCALC 84 07/12/2013 0525    CBC    Component Value Date/Time   WBC 12.8* 07/13/2013 0500   WBC 16.6* 07/04/2013 1213   WBC 7.9 11/24/2012 1910   RBC 4.25 07/13/2013 0500   RBC 4.64* 07/04/2013 1213   RBC 5.08 11/24/2012 1910   HGB 12.4* 07/13/2013 0500   HGB 13.7* 07/04/2013 1213   HGB 15.1 11/24/2012 1910   HCT 38.2* 07/13/2013 0500   HCT 43.4* 07/04/2013 1213   HCT 44.0 11/24/2012 1910   PLT 152 07/13/2013 0500   PLT 147* 11/24/2012 1910   MCV 89.9 07/13/2013  0500   MCV 93.5 07/04/2013 1213   MCV 87 11/24/2012 1910   MCH 29.2 07/13/2013 0500   MCH 29.5 07/04/2013 1213   MCH 29.8 11/24/2012 1910   MCHC 32.5 07/13/2013 0500   MCHC 31.6* 07/04/2013 1213   MCHC 34.4 11/24/2012 1910   RDW 14.0 07/13/2013 0500   RDW 14.8* 11/24/2012 1910   LYMPHSABS 2.1 07/11/2013 1940   MONOABS 1.3* 07/11/2013 1940   EOSABS 0.2  07/11/2013 1940   BASOSABS 0.0 07/11/2013 1940    Hgb A1C Lab Results  Component Value Date   HGBA1C 5.6 05/08/2014      Assessment and Plan:   Medicare Annual Wellness Visit:  Diet: Heart healthy Physical activity: Sedentary Depression/mood screen: Negative Hearing: Intact to whispered voice Visual acuity: Grossly normal ADLs: Capable Fall risk: He has fallen 2-3 times this years, he missteps, no injury Home safety: Good Cognitive evaluation: Intact to orientation, naming, recall and repetition EOL planning: No adv directives, full code/ I agree  Preventative Medicine:  He declines flu, prevnar, pneumovax, tetanus and zostovax PSA today He declines colonoscopy but is agreeable to IFOB- ordered today Encouraged him to see an eye doctor and dentist at least annually   Next appointment: 6 months

## 2014-12-17 NOTE — Assessment & Plan Note (Signed)
Controlled on Lisinopril Will check CBC and CMET today

## 2014-12-17 NOTE — Assessment & Plan Note (Signed)
Will check Lipid Profile, CMET and A1C today Encouraged him to consume a low fat diet Continue Fish Oil

## 2014-12-17 NOTE — Assessment & Plan Note (Signed)
Will try Neurontin

## 2014-12-17 NOTE — Patient Instructions (Signed)

## 2014-12-17 NOTE — Assessment & Plan Note (Signed)
Will check Lipid Profile today Continue Fish Oil and Baby ASA

## 2014-12-17 NOTE — Progress Notes (Signed)
Pre visit review using our clinic review tool, if applicable. No additional management support is needed unless otherwise documented below in the visit note. 

## 2014-12-17 NOTE — Assessment & Plan Note (Signed)
Treating neuropathic pain with Neurontin Lets see if this helps with insomnia before adding sleep aid

## 2014-12-26 ENCOUNTER — Encounter: Payer: Self-pay | Admitting: Cardiology

## 2014-12-26 ENCOUNTER — Ambulatory Visit (INDEPENDENT_AMBULATORY_CARE_PROVIDER_SITE_OTHER): Payer: Medicare Other | Admitting: Cardiology

## 2014-12-26 VITALS — BP 130/82 | Ht 73.0 in | Wt 214.2 lb

## 2014-12-26 DIAGNOSIS — I2510 Atherosclerotic heart disease of native coronary artery without angina pectoris: Secondary | ICD-10-CM

## 2014-12-26 DIAGNOSIS — E785 Hyperlipidemia, unspecified: Secondary | ICD-10-CM | POA: Diagnosis not present

## 2014-12-26 DIAGNOSIS — I10 Essential (primary) hypertension: Secondary | ICD-10-CM | POA: Diagnosis not present

## 2014-12-26 NOTE — Patient Instructions (Signed)
Continue your current therapy  I will see you in one year   
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
## 2015-01-02 ENCOUNTER — Other Ambulatory Visit (INDEPENDENT_AMBULATORY_CARE_PROVIDER_SITE_OTHER): Payer: Self-pay

## 2015-01-02 DIAGNOSIS — Z1211 Encounter for screening for malignant neoplasm of colon: Secondary | ICD-10-CM

## 2015-01-02 LAB — FECAL OCCULT BLOOD, IMMUNOCHEMICAL: Fecal Occult Bld: NEGATIVE

## 2015-05-28 ENCOUNTER — Encounter: Payer: Self-pay | Admitting: Family

## 2015-05-28 ENCOUNTER — Ambulatory Visit (INDEPENDENT_AMBULATORY_CARE_PROVIDER_SITE_OTHER): Payer: Medicare Other | Admitting: Family

## 2015-05-28 VITALS — BP 146/84 | HR 113 | Temp 98.8°F | Ht 73.0 in | Wt 214.0 lb

## 2015-05-28 DIAGNOSIS — R05 Cough: Secondary | ICD-10-CM

## 2015-05-28 DIAGNOSIS — R059 Cough, unspecified: Secondary | ICD-10-CM

## 2015-05-28 DIAGNOSIS — R000 Tachycardia, unspecified: Secondary | ICD-10-CM | POA: Diagnosis not present

## 2015-05-28 MED ORDER — ALBUTEROL SULFATE HFA 108 (90 BASE) MCG/ACT IN AERS: 2.0000 | INHALATION_SPRAY | Freq: Four times a day (QID) | RESPIRATORY_TRACT | Status: DC | PRN

## 2015-05-28 MED ORDER — GUAIFENESIN ER 600 MG PO TB12: 1200.0000 mg | ORAL_TABLET | Freq: Two times a day (BID) | ORAL | Status: DC | PRN

## 2015-05-28 MED ORDER — AZITHROMYCIN 250 MG PO TABS: ORAL_TABLET | ORAL | Status: DC

## 2015-05-28 NOTE — Patient Instructions (Addendum)
Your heart rate is high tonight. I suspect that is from your respiratory illness ( possibly pneumonia) and worry about this extra work for your heart.  I would've liked to have done an EKG tonight. My Advice is to go to [facility] due to your heart history ( CAD, Stent). If you do not go, please have a Chest Xray at [facility] and call our nurses in the morning to let us know how you are doing.   Prescriptions: Mucinex. Azithromycin.Albuterol inhaler.  Increase intake of clear fluids. Congestion is best treated by hydration, when mucus is wetter, it is thinner, less sticky, and easier to expel from the body, either through coughing up drainage, or by blowing your nose.   Get plenty of rest.   Use saline nasal drops and blow your nose frequently. Run a humidifier at night and elevate the head of the bed. Vicks Vapor rub will help with congestion and cough. Steam showers and sinus massage for congestion.    If there is no improvement in your symptoms, or if there is any worsening of symptoms, or if you have any additional concerns, please return for re-evaluation; or, if we are closed, consider going to the Emergency Room for evaluation if symptoms urgent.  Nonspecific Tachycardia Tachycardia is a faster than normal heartbeat (more than 100 beats per minute). In adults, the heart normally beats between 60 and 100 times a minute. A fast heartbeat may be a normal response to exercise or stress. It does not necessarily mean that something is wrong. However, sometimes when your heart beats too fast it may not be able to pump enough blood to the rest of your body. This can result in chest pain, shortness of breath, dizziness, and even fainting. Nonspecific tachycardia means that the specific cause or pattern of your tachycardia is unknown. CAUSES  Tachycardia may be harmless or it may be due to a more serious underlying cause. Possible causes of tachycardia include:  Exercise or  exertion.  Fever.  Pain or injury.  Infection.  Loss of body fluids (dehydration).  Overactive thyroid.  Lack of red blood cells (anemia).  Anxiety and stress.  Alcohol.  Caffeine.  Tobacco products.  Diet pills.  Illegal drugs.  Heart disease. SYMPTOMS  Rapid or irregular heartbeat (palpitations).  Suddenly feeling your heart beating (cardiac awareness).  Dizziness.  Tiredness (fatigue).  Shortness of breath.  Chest pain.  Nausea.  Fainting. DIAGNOSIS  Your caregiver will perform a physical exam and take your medical history. In some cases, a heart specialist (cardiologist) may be consulted. Your caregiver may also order:  Blood tests.  Electrocardiography. This test records the electrical activity of your heart.  A heart monitoring test. TREATMENT  Treatment will depend on the likely cause of your tachycardia. The goal is to treat the underlying cause of your tachycardia. Treatment methods may include:  Replacement of fluids or blood through an intravenous (IV) tube for moderate to severe dehydration or anemia.  New medicines or changes in your current medicines.  Diet and lifestyle changes.  Treatment for certain infections.  Stress relief or relaxation methods. HOME CARE INSTRUCTIONS   Rest.  Drink enough fluids to keep your urine clear or pale yellow.  Do not smoke.  Avoid:  Caffeine.  Tobacco.  Alcohol.  Chocolate.  Stimulants such as over-the-counter diet pills or pills that help you stay awake.  Situations that cause anxiety or stress.  Illegal drugs such as marijuana, phencyclidine (PCP), and cocaine.  Only  take medicine as directed by your caregiver.  Keep all follow-up appointments as directed by your caregiver. SEEK IMMEDIATE MEDICAL CARE IF:   You have pain in your chest, upper arms, jaw, or neck.  You become weak, dizzy, or feel faint.  You have palpitations that will not go away.  You vomit, have  diarrhea, or pass blood in your stool.  Your skin is cool, pale, and wet.  You have a fever that will not go away with rest, fluids, and medicine. MAKE SURE YOU:   Understand these instructions.  Will watch your condition.  Will get help right away if you are not doing well or get worse.   This information is not intended to replace advice given to you by your health care provider. Make sure you discuss any questions you have with your health care provider.   Document Released: 02/06/2004 Document Revised: 03/23/2011 Document Reviewed: 07/13/2014 Elsevier Interactive Patient Education 2016 [place] Pneumonia, Adult Pneumonia is an infection of the lungs. There are different types of pneumonia. One type can develop while a person is in a hospital. A different type, called community-acquired pneumonia, develops in people who are not, or have not recently been, in the hospital or other health care facility.  CAUSES Pneumonia may be caused by bacteria, viruses, or funguses. Community-acquired pneumonia is often caused by Streptococcus pneumonia bacteria. These bacteria are often passed from one person to another by breathing in droplets from the cough or sneeze of an infected person. RISK FACTORS The condition is more likely to develop in:  People who havechronic diseases, such as chronic obstructive pulmonary disease (COPD), asthma, congestive heart failure, cystic fibrosis, diabetes, or kidney disease.  People who haveearly-stage or late-stage HIV.  People who havesickle cell disease.  People who havehad their spleen removed (splenectomy).  People who havepoor Human resources officer.  People who havemedical conditions that increase the risk of breathing in (aspirating) secretions their own mouth and nose.   People who havea weakened immune system (immunocompromised).  People who smoke.  People whotravel to areas where pneumonia-causing germs commonly  exist.  People whoare around animal habitats or animals that have pneumonia-causing germs, including birds, bats, rabbits, cats, and farm animals. SYMPTOMS Symptoms of this condition include:  Adry cough.  A wet (productive) cough.  Fever.  Sweating.  Chest pain, especially when breathing deeply or coughing.  Rapid breathing or difficulty breathing.  Shortness of breath.  Shaking chills.  Fatigue.  Muscle aches. DIAGNOSIS Your health care provider will take a medical history and perform a physical exam. You may also have other tests, including:  Imaging studies of your chest, including X-rays.  Tests to check your blood oxygen level and other blood gases.  Other tests on blood, mucus (sputum), fluid around your lungs (pleural fluid), and urine. If your pneumonia is severe, other tests may be done to identify the specific cause of your illness. TREATMENT The type of treatment that you receive depends on many factors, such as the cause of your pneumonia, the medicines you take, and other medical conditions that you have. For most adults, treatment and recovery from pneumonia may occur at home. In some cases, treatment must happen in a hospital. Treatment may include:  Antibiotic medicines, if the pneumonia was caused by bacteria.  Antiviral medicines, if the pneumonia was caused by a virus.  Medicines that are given by mouth or through an IV tube.  Oxygen.  Respiratory therapy. Although rare, treating severe pneumonia may include:  Mechanical ventilation. This is done if you are not breathing well on your own and you cannot maintain a safe blood oxygen level.  Thoracentesis. This procedureremoves fluid around one lung or both lungs to help you breathe better. HOME CARE INSTRUCTIONS  Take over-the-counter and prescription medicines only as told by your health care provider.  Only takecough medicine if you are losing sleep. Understand that cough medicine can  prevent your body's natural ability to remove mucus from your lungs.  If you were prescribed an antibiotic medicine, take it as told by your health care provider. Do not stop taking the antibiotic even if you start to feel better.  Sleep in a semi-upright position at night. Try sleeping in a reclining chair, or place a few pillows under your head.  Do not use tobacco products, including cigarettes, chewing tobacco, and e-cigarettes. If you need help quitting, ask your health care provider.  Drink enough water to keep your urine clear or pale yellow. This will help to thin out mucus secretions in your lungs. PREVENTION There are ways that you can decrease your risk of developing community-acquired pneumonia. Consider getting a pneumococcal vaccine if:  You are older than 76 years of age.  You are older than 76 years of age and are undergoing cancer treatment, have chronic lung disease, or have other medical conditions that affect your immune system. Ask your health care provider if this applies to you. There are different types and schedules of pneumococcal vaccines. Ask your health care provider which vaccination option is best for you. You may also prevent community-acquired pneumonia if you take these actions:  Get an influenza vaccine every year. Ask your health care provider which type of influenza vaccine is best for you.  Go to the dentist on a regular basis.  Wash your hands often. Use hand sanitizer if soap and water are not available. SEEK MEDICAL CARE IF:  You have a fever.  You are losing sleep because you cannot control your cough with cough medicine. SEEK IMMEDIATE MEDICAL CARE IF:  You have worsening shortness of breath.  You have increased chest pain.  Your sickness becomes worse, especially if you are an older adult or have a weakened immune system.  You cough up blood.   This information is not intended to replace advice given to you by your health care  provider. Make sure you discuss any questions you have with your health care provider.   Document Released: 12/29/2004 Document Revised: 09/19/2014 Document Reviewed: 04/25/2014 Elsevier Interactive Patient Education [employer].

## 2015-05-28 NOTE — Progress Notes (Signed)
Subjective:    Patient ID: Colin Lopez, male    DOB: Mar 10, 1940, 75 y.o.   MRN: [id number]   Colin Lopez is a 75 y.o. male who presents today for an acute visit.    HPI Comments: Patient has history of stent, CAD. He reports dizziness, shortness of breath when supine, palpitations. Denies exertional chest pain or pressure, numbness or tingling radiating to left arm or jaw, frequent headaches, changes in vision.  Never smoker.    URI  This is a new problem. The current episode started in the past 7 days. The problem has been gradually worsening. There has been no fever. Associated symptoms include congestion, coughing, rhinorrhea, a sore throat and wheezing. Pertinent negatives include no chest pain, headaches, nausea or vomiting. Treatments tried: alseltzer plus, throat lozenges. The treatment provided no relief.   Past Medical History  Diagnosis Date  . Coronary disease     Status post stenting of the left circumflex coronary in 2009 with a bare-metal stent (with a 3.5x41mm Liberte stent)  . Dyslipidemia   . Hypertension     Poorly controlled  . Myocardial infarction ([place])   . Heart disease   . Hyperlipidemia    Allergies: Levaquin and Penicillins Current Outpatient Prescriptions on File Prior to Visit  Medication Sig Dispense Refill  . aspirin EC 81 MG tablet Take 1 tablet (81 mg total) by mouth daily. 90 tablet 3  . lisinopril (PRINIVIL,ZESTRIL) 10 MG tablet Take 1 tablet (10 mg total) by mouth daily. 30 tablet 6  . Omega-3 Fatty Acids (FISH OIL BURP-LESS) 1000 MG CAPS Take 1 capsule by mouth 3 (three) times daily after meals.     No current facility-administered medications on file prior to visit.    Social History  Substance Use Topics  . Smoking status: Never Smoker   . Smokeless tobacco: Never Used  . Alcohol Use: Yes     Comment: Rarely    Review of Systems  Constitutional: Negative for fever and chills.  HENT: Positive for congestion, rhinorrhea and sore  throat.   Respiratory: Positive for cough, shortness of breath and wheezing.   Cardiovascular: Positive for palpitations. Negative for chest pain and leg swelling.  Gastrointestinal: Negative for nausea and vomiting.  Neurological: Positive for dizziness. Negative for headaches.      Objective:    BP 146/84 mmHg  Pulse 113  Temp(Src) 98.8 F (37.1 C) (Oral)  Ht 6\' 1"  (1.854 m)  Wt 214 lb (97.07 kg)  BMI 28.24 kg/m2  SpO2 94%   Physical Exam  Constitutional: Vital signs are normal. He appears well-developed and well-nourished.  HENT:  Head: Normocephalic and atraumatic.  Right Ear: Hearing, tympanic membrane, external ear and ear canal normal. No drainage, swelling or tenderness. Tympanic membrane is not injected, not erythematous and not bulging. No middle ear effusion. No decreased hearing is noted.  Left Ear: Hearing, tympanic membrane, external ear and ear canal normal. No drainage, swelling or tenderness. Tympanic membrane is not injected, not erythematous and not bulging.  No middle ear effusion. No decreased hearing is noted.  Nose: Nose normal. Right sinus exhibits no maxillary sinus tenderness and no frontal sinus tenderness. Left sinus exhibits no maxillary sinus tenderness and no frontal sinus tenderness.  Mouth/Throat: Uvula is midline, oropharynx is clear and moist and mucous membranes are normal. No oropharyngeal exudate, posterior oropharyngeal edema, posterior oropharyngeal erythema or tonsillar abscesses.  Eyes: Conjunctivae are normal.  Cardiovascular: Regular rhythm and normal heart sounds.  Pulmonary/Chest: Effort normal. No respiratory distress. He has wheezes in the right lower field and the left lower field. He has no rhonchi. He has no rales.  Lymphadenopathy:       Head (right side): No submental, no submandibular, no tonsillar, no preauricular, no posterior auricular and no occipital adenopathy present.       Head (left side): No submental, no submandibular,  no tonsillar, no preauricular, no posterior auricular and no occipital adenopathy present.    He has no cervical adenopathy.  Neurological: He is alert.  Skin: Skin is warm and dry.  Psychiatric: He has a normal mood and affect. His speech is normal and behavior is normal.  Vitals reviewed.      Assessment & Plan:   1. Cough Working diagnosis of PNA. Afebrile. No acute respiratory distress. Tachycardia. Pending CXR.   - DG Chest 2 View; Future - guaiFENesin (MUCINEX) 600 MG 12 hr tablet; Take 2 tablets (1,200 mg total) by mouth 2 (two) times daily as needed for cough or to loosen phlegm.  Dispense: 60 tablet; Refill: 1 - azithromycin (ZITHROMAX) 250 MG tablet; Tale 500 mg PO on day 1, then 250 mg PO q24h x 4 days.  Dispense: 6 tablet; Refill: 0 - albuterol (PROVENTIL HFA) 108 (90 Base) MCG/ACT inhaler; Inhale 2 puffs into the lungs every 6 (six) hours as needed for wheezing or shortness of breath.  Dispense: 1 Inhaler; Refill: 1  2. Tachycardia Concern with patient's elevated heart rate in the context of his CAD , symptoms including dizziness, orthopnea, palpitations that he requires closer monitoring. I advised patient and wife to go [facility] room for further evaluation with EKG, serial cardiac enzymes. Patient Politely declined EKG. He was unsure whether or not they would go the emergency room and stated he would discuss it with wife once they left.     I am having Colin Lopez maintain his Colin Lopez, aspirin EC, and lisinopril.   No orders of the defined types were placed in this encounter.     Start medications as prescribed and explained to patient on After Visit Summary ( AVS). Risks, benefits, and alternatives of the medications and treatment plan prescribed today were discussed, and patient expressed understanding.   Education regarding symptom management and diagnosis given to patient.   Follow-up:Plan follow-up as discussed or as needed if any  worsening symptoms or change in condition.   Continue to follow with Colin Silversmith, NP for routine health maintenance.   Colin Lopez and I agreed with plan.   Colin Paris, FNP  Total of 25 minutes spent with patient, greater than 50% of which was spent in discussion of PNA, CAD and my advice to go to ED.

## 2015-05-28 NOTE — Progress Notes (Signed)
Pre visit review using our clinic review tool, if applicable. No additional management support is needed unless otherwise documented below in the visit note. 

## 2015-05-29 NOTE — Addendum Note (Signed)
Addended by: Carmin Muskrat on: 05/29/2015 10:41 AM   Modules accepted: Miquel Dunn

## 2015-05-29 NOTE — Progress Notes (Signed)
Patient stated he is feeling better and that the medication has helped with drainage. Patient was asked if he was having anymore chest pain or dizziness he stated that he was not.

## 2015-05-29 NOTE — Progress Notes (Signed)
LVTCB

## 2015-06-04 ENCOUNTER — Ambulatory Visit (INDEPENDENT_AMBULATORY_CARE_PROVIDER_SITE_OTHER): Payer: Medicare Other | Admitting: Family Medicine

## 2015-06-04 ENCOUNTER — Ambulatory Visit (INDEPENDENT_AMBULATORY_CARE_PROVIDER_SITE_OTHER)
Admission: RE | Admit: 2015-06-04 | Discharge: 2015-06-04 | Disposition: A | Discharge status: Home / Self Care | Payer: Medicare Other | Source: Ambulatory Visit | Attending: Family Medicine | Admitting: Family Medicine

## 2015-06-04 ENCOUNTER — Encounter: Payer: Self-pay | Admitting: Family Medicine

## 2015-06-04 VITALS — BP 150/78 | HR 83 | Temp 98.8°F | Wt 215.2 lb

## 2015-06-04 DIAGNOSIS — R059 Cough, unspecified: Secondary | ICD-10-CM

## 2015-06-04 DIAGNOSIS — R05 Cough: Secondary | ICD-10-CM

## 2015-06-04 NOTE — Patient Instructions (Signed)
Go to the lab on the way out.  We'll contact you with your xray and lab report. Update me in a few days if you don't continue to get better gradually.  We'll need to get you the pneumonia shot when you are feeling better.  Take care.  Glad to see you.

## 2015-06-04 NOTE — Progress Notes (Signed)
Pre visit review using our clinic review tool, if applicable. No additional management support is needed unless otherwise documented below in the visit note.  Sx started about 3 weeks ago, with sinus pressure pain and drainage.  Got progressively worse. Presumed PNA, seen last week.   No s/p zmax, didn't get SABA filled.  He didn't want to go to ER.  No imaging done last week, since he didn't go to the ER.  Some cough, still with grey lumps of sputum at night.  No fevers now.  He breathing is better than last week.  Some wheeze.  No vomiting, no diarrhea.  Still with sinus pressure and rhinorrhea.  Some ST, better now.  No ear pain now, prev with popping in the ears.    Meds, vitals, and allergies reviewed.   ROS: Per HPI unless specifically indicated in ROS section   GEN: nad, alert and oriented HEENT: mucous membranes moist, tm w/o erythema, nasal exam w/o erythema, clear discharge noted,  OP with cobblestoning, sinuses not ttp NECK: supple w/o LA CV: rrr.   PULM: ctab, no inc wob EXT: no edema SKIN: no acute rash

## 2015-06-05 ENCOUNTER — Other Ambulatory Visit: Payer: Self-pay | Admitting: Cardiology

## 2015-06-05 DIAGNOSIS — R051 Acute cough: Secondary | ICD-10-CM | POA: Insufficient documentation

## 2015-06-05 DIAGNOSIS — R05 Cough: Secondary | ICD-10-CM | POA: Insufficient documentation

## 2015-06-05 DIAGNOSIS — R059 Cough, unspecified: Secondary | ICD-10-CM | POA: Insufficient documentation

## 2015-06-05 LAB — CBC WITH DIFFERENTIAL/PLATELET
Basophils Absolute: 0 10*3/uL (ref 0.0–0.1)
Basophils Relative: 0.4 % (ref 0.0–3.0)
Eosinophils Absolute: 0.1 10*3/uL (ref 0.0–0.7)
Eosinophils Relative: 1.2 % (ref 0.0–5.0)
HCT: 39.1 % (ref 39.0–52.0)
Hemoglobin: 13.7 g/dL (ref 13.0–17.0)
Lymphocytes Relative: 22.7 % (ref 12.0–46.0)
Lymphs Abs: 1.9 10*3/uL (ref 0.7–4.0)
MCHC: 35.1 g/dL (ref 30.0–36.0)
MCV: 87 fl (ref 78.0–100.0)
Monocytes Absolute: 0.7 10*3/uL (ref 0.1–1.0)
Monocytes Relative: 8.3 % (ref 3.0–12.0)
Neutro Abs: 5.5 10*3/uL (ref 1.4–7.7)
Neutrophils Relative %: 67.4 % (ref 43.0–77.0)
Platelets: 143 10*3/uL — ABNORMAL LOW (ref 150.0–400.0)
RBC: 4.49 Mil/uL (ref 4.22–5.81)
RDW: 14.3 % (ref 11.5–15.5)
WBC: 8.2 10*3/uL (ref 4.0–10.5)

## 2015-06-05 NOTE — Assessment & Plan Note (Signed)
See AVS and labs/cxr.  Needs PNA vaccine when well.  Wouldn't continue abx at this point since ctab and sinuses not ttp, nontoxic.   Okay for outpatient f/u.

## 2015-06-12 ENCOUNTER — Other Ambulatory Visit: Payer: Self-pay

## 2015-06-12 NOTE — Telephone Encounter (Signed)
Colin Lopez (DPR signed) left v/m;pt was seen 06/04/15; pt still having same problems as when seen on 06/04/15; pt continues with prod cough with grey phlegm,when blows nose has sticky white mucus; pt feels sore in chest because coughing so much; wheezing is better than when seen;no fever. and Colin Lopez request abx to [facility] [address]. Colin Lopez request cb.

## 2015-06-12 NOTE — Telephone Encounter (Signed)
If no fever and now the wheeze is better, and the sputum is grey or white, then I wouldn't restart abx at this point.   Tessalon may help the cough, okay to send it if desired.  I have it ready to send in the orders section.  Thanks.

## 2015-06-13 MED ORDER — BENZONATATE 200 MG PO CAPS: 200.0000 mg | ORAL_CAPSULE | Freq: Three times a day (TID) | ORAL | Status: DC | PRN

## 2015-06-13 NOTE — Telephone Encounter (Signed)
Electronic refill request. Last Filled:   #30   1 RF on 07/22/13  Last office visit:   06/04/15.  Please advise.

## 2015-12-18 ENCOUNTER — Ambulatory Visit (INDEPENDENT_AMBULATORY_CARE_PROVIDER_SITE_OTHER): Payer: Medicare Other | Admitting: Internal Medicine

## 2015-12-18 ENCOUNTER — Encounter: Payer: Self-pay | Admitting: Internal Medicine

## 2015-12-18 VITALS — BP 142/84 | HR 80 | Temp 98.0°F | Wt 207.0 lb

## 2015-12-18 DIAGNOSIS — J069 Acute upper respiratory infection, unspecified: Secondary | ICD-10-CM

## 2015-12-18 DIAGNOSIS — B9789 Other viral agents as the cause of diseases classified elsewhere: Secondary | ICD-10-CM

## 2015-12-18 MED ORDER — HYDROCODONE-HOMATROPINE 5-1.5 MG/5ML PO SYRP: 5.0000 mL | ORAL_SOLUTION | Freq: Three times a day (TID) | ORAL | 0 refills | Status: DC | PRN

## 2015-12-18 NOTE — Patient Instructions (Signed)
Cough, Adult Introduction A cough helps to clear your throat and lungs. A cough may last only 2-3 weeks (acute), or it may last longer than 8 weeks (chronic). Many different things can cause a cough. A cough may be a sign of an illness or another medical condition. Follow these instructions at home:  Pay attention to any changes in your cough.  Take medicines only as told by your doctor.  If you were prescribed an antibiotic medicine, take it as told by your doctor. Do not stop taking it even if you start to feel better.  Talk with your doctor before you try using a cough medicine.  Drink enough fluid to keep your pee (urine) clear or pale yellow.  If the air is dry, use a cold steam vaporizer or humidifier in your home.  Stay away from things that make you cough at work or at home.  If your cough is worse at night, try using extra pillows to raise your head up higher while you sleep.  Do not smoke, and try not to be around smoke. If you need help quitting, ask your doctor.  Do not have caffeine.  Do not drink alcohol.  Rest as needed. Contact a doctor if:  You have new problems (symptoms).  You cough up yellow fluid (pus).  Your cough does not get better after 2-3 weeks, or your cough gets worse.  Medicine does not help your cough and you are not sleeping well.  You have pain that gets worse or pain that is not helped with medicine.  You have a fever.  You are losing weight and you do not know why.  You have night sweats. Get help right away if:  You cough up blood.  You have trouble breathing.  Your heartbeat is very fast. This information is not intended to replace advice given to you by your health care provider. Make sure you discuss any questions you have with your health care provider. Document Released: 09/11/2010 Document Revised: 06/06/2015 Document Reviewed: 03/07/2014  2017 Elsevier  

## 2015-12-18 NOTE — Progress Notes (Signed)
HPI  Pt presents to the clinic today with c/o runny nose, sore throat and cough. This started 5 days ago. He is blowing blood tinged brown mucous out of his nose. He denies difficulty swallowing. The cough is productive of white mucous. He denies fever, chills, body aches or shortness of breath. He has taken Tylenol with minimal relief. He has no history of allergies or breathing problems. He has not had sick contacts that he is aware of.  Review of Systems        Past Medical History:  Diagnosis Date  . Coronary disease    Status post stenting of the left circumflex coronary in 2009 with a bare-metal stent (with a 3.5x35mm Liberte stent)  . Dyslipidemia   . Heart disease   . Hyperlipidemia   . Hypertension    Poorly controlled  . Myocardial infarction     Family History  Problem Relation Age of Onset  . Diabetes Mother   . Heart disease Sister   . Cancer Brother     abdominal    Social History   Social History  . Marital status: Married    Spouse name: N/A  . Number of children: 6  . Years of education: N/A   Occupational History  . upholstery    Social History Main Topics  . Smoking status: Never Smoker  . Smokeless tobacco: Never Used  . Alcohol use Yes     Comment: Rarely  . Drug use: No  . Sexual activity: Not Currently   Other Topics Concern  . Not on file   Social History Narrative  . No narrative on file    Allergies  Allergen Reactions  . Levaquin [Levofloxacin In D5w]     Rash  . Penicillins Hives     Constitutional: Denies headache, fatigue, fever or abrupt weight changes.  HEENT:  Positive runny nose, sore throat. Denies eye redness, eye pain, pressure behind the eyes, facial pain, nasal congestion, ear pain, ringing in the ears, wax buildup, or bloody nose. Respiratory: Positive cough. Denies difficulty breathing or shortness of breath.  Cardiovascular: Denies chest pain, chest tightness, palpitations or swelling in the hands or feet.    No other specific complaints in a complete review of systems (except as listed in HPI above).  Objective:   BP (!) 142/84   Pulse 80   Temp 98 F (36.7 C) (Oral)   Wt 207 lb (93.9 kg)   SpO2 97%   BMI 27.31 kg/m  Wt Readings from Last 3 Encounters:  12/18/15 207 lb (93.9 kg)  06/04/15 215 lb 4 oz (97.6 kg)  05/28/15 214 lb (97.1 kg)     General: Appears his stated age, in NAD. HEENT: Head: normal shape and size, no sinus tenderness noted; Eyes: sclera white, no icterus, conjunctiva pink; Ears: Tm's gray and intact, normal light reflex, +serous effusion bilaterally; Nose: mucosa boggyand moist, septum midline; Throat/Mouth: + PND. Teeth present, mucosa erythematous and moist, no exudate noted, no lesions or ulcerations noted.  Neck: No cervical lymphadenopathy.  Cardiovascular: Normal rate and rhythm. S1,S2 noted.  No murmur, rubs or gallops noted.  Pulmonary/Chest: Normal effort and positive vesicular breath sounds. No respiratory distress. No wheezes, rales or ronchi noted.       Assessment & Plan:   Viral URI with Cough:  Get some rest and drink plenty of water Do salt water gargles for the sore throat Start Zyrtec and Flonase OTC eRx for Hycodan cough syrup  RTC as needed  or if symptoms persist.   Webb Silversmith, NP

## 2016-02-27 ENCOUNTER — Other Ambulatory Visit: Payer: Self-pay | Admitting: Physician Assistant

## 2016-02-27 ENCOUNTER — Ambulatory Visit (INDEPENDENT_AMBULATORY_CARE_PROVIDER_SITE_OTHER): Payer: Medicare Other | Admitting: Physician Assistant

## 2016-02-27 ENCOUNTER — Encounter: Payer: Self-pay | Admitting: Physician Assistant

## 2016-02-27 VITALS — BP 110/88 | HR 112 | Ht 73.0 in | Wt 215.0 lb

## 2016-02-27 DIAGNOSIS — R079 Chest pain, unspecified: Secondary | ICD-10-CM

## 2016-02-27 DIAGNOSIS — E781 Pure hyperglyceridemia: Secondary | ICD-10-CM

## 2016-02-27 DIAGNOSIS — E785 Hyperlipidemia, unspecified: Secondary | ICD-10-CM

## 2016-02-27 MED ORDER — NITROGLYCERIN 0.4 MG SL SUBL: 0.4000 mg | SUBLINGUAL_TABLET | SUBLINGUAL | 3 refills | Status: DC | PRN
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
## 2016-02-27 NOTE — Patient Instructions (Signed)
Your physician has requested that you have an exercise stress myoview. For further information please visit [email or web address]. Please follow instruction sheet, as given.  Suanne Marker, PA recommends that you drink at least 6-8  8 oz glasses of water daily  Your physician recommends that you schedule a follow-up appointment with Dr. [name] (first available)

## 2016-03-04 ENCOUNTER — Telehealth (HOSPITAL_COMMUNITY): Payer: Self-pay

## 2016-03-04 NOTE — Telephone Encounter (Signed)
Encounter complete. 

## 2016-03-05 ENCOUNTER — Inpatient Hospital Stay (HOSPITAL_COMMUNITY): Admission: RE | Admit: 2016-03-05 | Payer: Medicare Other | Source: Ambulatory Visit

## 2016-03-20 ENCOUNTER — Ambulatory Visit: Payer: Self-pay | Admitting: Cardiology

## 2016-03-25 ENCOUNTER — Telehealth: Payer: Self-pay | Admitting: Internal Medicine

## 2016-03-25 NOTE — Telephone Encounter (Signed)
Left pt message asking to call Allison back directly at [phone] to schedule AWV/CPE with PCP. °

## 2016-04-20 ENCOUNTER — Ambulatory Visit (INDEPENDENT_AMBULATORY_CARE_PROVIDER_SITE_OTHER): Payer: Medicare Other | Admitting: Family Medicine

## 2016-04-20 ENCOUNTER — Telehealth: Payer: Self-pay | Admitting: Internal Medicine

## 2016-04-20 ENCOUNTER — Ambulatory Visit (INDEPENDENT_AMBULATORY_CARE_PROVIDER_SITE_OTHER)
Admission: RE | Admit: 2016-04-20 | Discharge: 2016-04-20 | Disposition: A | Discharge status: Home / Self Care | Payer: Medicare Other | Source: Ambulatory Visit | Attending: Family Medicine | Admitting: Family Medicine

## 2016-04-20 ENCOUNTER — Encounter: Payer: Self-pay | Admitting: Family Medicine

## 2016-04-20 VITALS — BP 144/92 | HR 85 | Temp 97.8°F | Wt 212.2 lb

## 2016-04-20 DIAGNOSIS — R911 Solitary pulmonary nodule: Secondary | ICD-10-CM | POA: Insufficient documentation

## 2016-04-20 DIAGNOSIS — R042 Hemoptysis: Secondary | ICD-10-CM | POA: Diagnosis not present

## 2016-04-20 DIAGNOSIS — G8929 Other chronic pain: Secondary | ICD-10-CM | POA: Diagnosis not present

## 2016-04-20 DIAGNOSIS — M898X1 Other specified disorders of bone, shoulder: Secondary | ICD-10-CM

## 2016-04-20 DIAGNOSIS — R918 Other nonspecific abnormal finding of lung field: Secondary | ICD-10-CM | POA: Diagnosis not present

## 2016-04-20 DIAGNOSIS — R0789 Other chest pain: Secondary | ICD-10-CM | POA: Diagnosis not present

## 2016-04-20 MED ORDER — METHOCARBAMOL 500 MG PO TABS: 250.0000 mg | ORAL_TABLET | Freq: Three times a day (TID) | ORAL | 1 refills | Status: DC | PRN

## 2016-04-20 NOTE — Assessment & Plan Note (Signed)
L scapular pain ongoing over last 5+ months, reproducible to palpation of inferior left scapula pointing to MSK cause. Treat with heat, tylenol, robaxin muscle relaxant. Update with effect. Consider thoracic spine referred pain.

## 2016-04-20 NOTE — Assessment & Plan Note (Addendum)
Isolated episode over the weekend however he does endorse more than just blood tinged sputum. Check CXR today. He has had several long trips to beach. Check D dimer to eval for PE. Not consistent with pneumonia or bronchitis. Endorses ongoing orthopnea - will see if we can add BNP (although no significant crackles or pedal edema). Discussed with patient and wife.

## 2016-04-20 NOTE — Patient Instructions (Addendum)
I think you have a scapular strain. Treat with muscle relaxant sent to pharmacy. May use tylenol as needed as well as heating pad to back. Gentle stretching of back with exercises provided today.  For coughing up blood: Xray today. Lab today to rule out blood clot.  Let us know if not better with above treatment.  Call heart doctor to reschedule stress test.

## 2016-04-20 NOTE — Telephone Encounter (Signed)
I spoke with pts wife and pt had coughed up blood tinged mucus. pts wife scheduled appt with Dr Darnell Level today at [place]. If pt condition changes or worsens Mrs Stech will cb.

## 2016-04-20 NOTE — Telephone Encounter (Signed)
Will see today.  

## 2016-04-20 NOTE — Telephone Encounter (Signed)
Patient Name: Colin Lopez DOB: Sep 17, 1940 Initial Comment Caller states that she needs to make an appointment, chest is sore, coughing up blood. Nurse Assessment Nurse: Ronnald Ramp, RN, Miranda Date/Time (Eastern Time): 04/20/2016 10:23:03 AM Confirm and document reason for call. If symptomatic, describe symptoms. ---Caller states her husband started coughing up blood this weekend. He is c/o chest being sore and pain in his back just below the left shoulder blade. Caller is not with the pt and she states he will not answer if the nurse tried to call him. She does not what he means by his chest being sore. Told caller there are no appts today at the office. She wanted an appt for tomorrow. Does the patient have any new or worsening symptoms? ---Yes Will a triage be completed? ---No Select reason for no triage. ---Other Please document clinical information provided and list any resource used. ---Told caller that this RN did not feel comfortable making an appt for him to be seen tomorrow without speaking to the pt to assess the chest discomfort. Caller states she will have the pt call back. Guidelines Guideline Title Affirmed Question Affirmed Notes Final Disposition User Clinical Call Ronnald Ramp, RN, [employer]

## 2016-04-20 NOTE — Progress Notes (Signed)
Pre visit review using our clinic review tool, if applicable. No additional management support is needed unless otherwise documented below in the visit note. 

## 2016-04-20 NOTE — Progress Notes (Signed)
BP (!) 144/92   Pulse 85   Temp 97.8 F (36.6 C) (Oral)   Wt 212 lb 4 oz (96.3 kg)   SpO2 93%   BMI 28.00 kg/m    CC: cough Subjective:    Patient ID: Colin Lopez, male    DOB: [birth year], 76 y.o.   MRN: [id number]  HPI: Colin Lopez is a 76 y.o. male presenting on 04/20/2016 for Cough (coughed up blood the other day; has continued pain under shoulder blade (has seen cards for this))   5 mo h/o L posterior shoulder blade pain described as ache and throb with radiation down to lateral chest. Coughed up bloody sputum on Saturday - this lasted several hours. None since. Otherwise not really coughing much. Today feels better. Endorses intermittent nasal congestion and PNdrainage. Occasional dyspnea and wheezing. Sleeps on 3 pillows - chronic issue. Pain more noticeable with prolonged drives to beach.   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   Denies fevers/chills, ear or tooth pain, ST. No nausea or diaphoresis. No sick contacts at home. Non smoker, no second hand smoke.  No h/o asthma, COPD.   Has seen cardiology for this - thought MSK but recommended stress testing - pt never completed.  Known CAD s/p stenting 2009 with BMS. Stress test last 2010.   H/o pneumonia with L lung collapse several years ago. Still need pneumococcal vaccine.  Relevant past medical, surgical, family and social history reviewed and updated as indicated. Interim medical history since our last visit reviewed. Allergies and medications reviewed and updated. Outpatient Medications Prior to Visit  Medication Sig Dispense Refill  . aspirin EC 81 MG tablet Take 1 tablet (81 mg total) by mouth daily. 90 tablet 3  . Omega-3 Fatty Acids (FISH OIL BURP-LESS) 1000 MG CAPS Take 1 capsule by mouth 3 (three) times daily after meals.    . nitroGLYCERIN (NITROSTAT) 0.4 MG SL tablet Place 1 tablet (0.4 mg total) under the tongue every 5 (five) minutes as needed for chest pain. (Patient not  taking: Reported on 04/20/2016) 25 tablet 3   No facility-administered medications prior to visit.      Per HPI unless specifically indicated in ROS section below Review of Systems     Objective:    BP (!) 144/92   Pulse 85   Temp 97.8 F (36.6 C) (Oral)   Wt 212 lb 4 oz (96.3 kg)   SpO2 93%   BMI 28.00 kg/m   Wt Readings from Last 3 Encounters:  04/20/16 212 lb 4 oz (96.3 kg)  02/27/16 215 lb (97.5 kg)  12/18/15 207 lb (93.9 kg)    Physical Exam  Constitutional: He appears well-developed and well-nourished. No distress.  HENT:  Mouth/Throat: Oropharynx is clear and moist. No oropharyngeal exudate.  Neck: No thyromegaly present.  Cardiovascular: Normal rate, regular rhythm, normal heart sounds and intact distal pulses.   No murmur heard. Pulmonary/Chest: Effort normal and breath sounds normal. No respiratory distress. He has no wheezes. He has no rales. He exhibits no tenderness.  Lungs clear Tender to palpation medial and inferior to bilateral scapula L>R - this reproduces tenderness endorsed  Musculoskeletal: He exhibits no edema.  Lymphadenopathy:       Head (right side): No submental, no submandibular, no tonsillar, no preauricular and no posterior auricular adenopathy present.       Head (left side): No submental, no submandibular, no tonsillar, no preauricular and no posterior auricular adenopathy present.  He has no cervical adenopathy.       Right: No supraclavicular adenopathy present.       Left: No supraclavicular adenopathy present.  Skin: Skin is warm and dry. No rash noted.  Psychiatric: He has a normal mood and affect.  Nursing note and vitals reviewed.      Assessment & Plan:   Problem List Items Addressed This Visit    Chronic scapular pain - Primary    L scapular pain ongoing over last 5+ months, reproducible to palpation of inferior left scapula pointing to MSK cause. Treat with heat, tylenol, robaxin muscle relaxant. Update with effect. Consider  thoracic spine referred pain.       Relevant Orders   DG Chest 2 View   Hemoptysis    Isolated episode over the weekend however he does endorse more than just blood tinged sputum. Check CXR today. He has had several long trips to beach. Check D dimer to eval for PE. Not consistent with pneumonia or bronchitis. Endorses ongoing orthopnea - will see if we can add BNP (although no significant crackles or pedal edema). Discussed with patient and wife.       Relevant Orders   DG Chest 2 View   D-dimer, quantitative (not at [facility])   CBC with Differential/Platelet       Follow up plan: Return if symptoms worsen or fail to improve.  Ria Bush, MD

## 2016-04-21 ENCOUNTER — Telehealth: Payer: Self-pay | Admitting: *Deleted

## 2016-04-21 ENCOUNTER — Other Ambulatory Visit: Payer: Self-pay | Admitting: Family Medicine

## 2016-04-21 ENCOUNTER — Ambulatory Visit
Admission: RE | Admit: 2016-04-21 | Discharge: 2016-04-21 | Disposition: A | Discharge status: Home / Self Care | Payer: Medicare Other | Source: Ambulatory Visit | Attending: Family Medicine | Admitting: Family Medicine

## 2016-04-21 DIAGNOSIS — R042 Hemoptysis: Secondary | ICD-10-CM | POA: Insufficient documentation

## 2016-04-21 DIAGNOSIS — I700 Atherosclerosis of aorta: Secondary | ICD-10-CM | POA: Insufficient documentation

## 2016-04-21 DIAGNOSIS — R918 Other nonspecific abnormal finding of lung field: Secondary | ICD-10-CM | POA: Diagnosis not present

## 2016-04-21 LAB — CBC WITH DIFFERENTIAL/PLATELET
Basophils Absolute: 0.1 10*3/uL (ref 0.0–0.1)
Basophils Relative: 0.5 % (ref 0.0–3.0)
Eosinophils Absolute: 0.2 10*3/uL (ref 0.0–0.7)
Eosinophils Relative: 2.3 % (ref 0.0–5.0)
HCT: 42.5 % (ref 39.0–52.0)
Hemoglobin: 14.7 g/dL (ref 13.0–17.0)
Lymphocytes Relative: 22.4 % (ref 12.0–46.0)
Lymphs Abs: 2.2 10*3/uL (ref 0.7–4.0)
MCHC: 34.6 g/dL (ref 30.0–36.0)
MCV: 87 fl (ref 78.0–100.0)
Monocytes Absolute: 0.8 10*3/uL (ref 0.1–1.0)
Monocytes Relative: 7.8 % (ref 3.0–12.0)
Neutro Abs: 6.6 10*3/uL (ref 1.4–7.7)
Neutrophils Relative %: 67 % (ref 43.0–77.0)
Platelets: 162 10*3/uL (ref 150.0–400.0)
RBC: 4.88 Mil/uL (ref 4.22–5.81)
RDW: 14.9 % (ref 11.5–15.5)
WBC: 9.9 10*3/uL (ref 4.0–10.5)

## 2016-04-21 LAB — POCT I-STAT CREATININE: Creatinine, Ser: 0.9 mg/dL (ref 0.61–1.24)

## 2016-04-21 LAB — D-DIMER, QUANTITATIVE: D-Dimer, Quant: 2.58 mcg/mL FEU — ABNORMAL HIGH (ref <0.50)

## 2016-04-21 MED ORDER — DOXYCYCLINE HYCLATE 100 MG PO TABS: 100.0000 mg | ORAL_TABLET | Freq: Two times a day (BID) | ORAL | 0 refills | Status: DC

## 2016-04-21 MED ORDER — IOPAMIDOL (ISOVUE-370) INJECTION 76%
75.0000 mL | Freq: Once | INTRAVENOUS | Status: AC | PRN
  Administered 2016-04-21: 75 mL via INTRAVENOUS

## 2016-04-21 NOTE — Telephone Encounter (Signed)
Ria Comment from [facility] called with report of CT:  No acute pulmonary embolus.  Superior segment left lower lobe rounded 2.4 cm opacity is nonspecific and corresponds to the radiographic finding yesterday. This could represent a round pneumonia, and there is a small associated left pleural effusion. Followup PA and lateral chest X-ray is recommended in 3-4 weeks following trial of antibiotic therapy to ensure resolution and exclude underlying malignancy.  Calcified aortic atherosclerosis.  I advised to send patient home and that we would call him with results.

## 2016-04-21 NOTE — Telephone Encounter (Signed)
Called home - no answer. Called cell - not accepting new messages. plz call tomorrow - CT reassuringly negative for blood clot. Did show again likely LLL pneumonia.  rec finish abx treatment, ok to hold robaxin (methocarbamol) muscle relaxant for now.  rec RTC 1 mo rpt CXR to ensure full resolution of pneumonia.

## 2016-04-22 NOTE — Telephone Encounter (Signed)
Patient notified and verbalized understanding. 

## 2016-05-05 ENCOUNTER — Encounter: Payer: Self-pay | Admitting: Family Medicine

## 2016-05-05 ENCOUNTER — Telehealth (HOSPITAL_COMMUNITY): Payer: Self-pay | Admitting: Physician Assistant

## 2016-05-05 ENCOUNTER — Ambulatory Visit (INDEPENDENT_AMBULATORY_CARE_PROVIDER_SITE_OTHER): Payer: Medicare Other | Admitting: Family Medicine

## 2016-05-05 VITALS — BP 166/82 | HR 76 | Temp 97.8°F | Wt 214.0 lb

## 2016-05-05 DIAGNOSIS — J181 Lobar pneumonia, unspecified organism: Secondary | ICD-10-CM | POA: Diagnosis not present

## 2016-05-05 DIAGNOSIS — R590 Localized enlarged lymph nodes: Secondary | ICD-10-CM | POA: Diagnosis not present

## 2016-05-05 DIAGNOSIS — J189 Pneumonia, unspecified organism: Secondary | ICD-10-CM

## 2016-05-05 NOTE — Progress Notes (Signed)
Pre visit review using our clinic review tool, if applicable. No additional management support is needed unless otherwise documented below in the visit note. 

## 2016-05-05 NOTE — Assessment & Plan Note (Signed)
L scapular pain is resolving after treatment of LLL PNA with doxy 10d course. Pt wanted to ensure he is on the mend. Reassured. rec return 2 wks for f/u CXR. Pt and wife agree with plan.

## 2016-05-05 NOTE — Assessment & Plan Note (Addendum)
R lateral pectoral LN. Anticipate localized reaction with swollen lymph node after IV contrast - but not true allergic reaction so likely ok to receive contrast in the future. Anticipate will fully resolve over 2 wks.

## 2016-05-05 NOTE — Patient Instructions (Signed)
I'm glad we're doing better Continue lots of fluids. Lungs sound better. Return in 2 weeks for xray to ensure lung infection has cleared up.

## 2016-05-05 NOTE — Progress Notes (Signed)
BP (!) 166/82   Pulse 76   Temp 97.8 F (36.6 C) (Oral)   Wt 214 lb (97.1 kg)   SpO2 94%   BMI 28.23 kg/m    CC: f/u visit Subjective:    Patient ID: Colin Lopez, male    DOB: July 30, 1940, 76 y.o.   MRN: [id number]  HPI: Colin Lopez is a 76 y.o. male presenting on 05/05/2016 for Follow-up (wants to know if PNA is gone)   See prior note for details.  Briefly, seen here 04/20/2016 with ongoing L scapular pain over several months as well as isolated episode of hemoptysis. CXR showed concerns for LLL PNA - treated with doxycycline antibiotic. Doxycycline caused water blisters on leg and buttock. Overall improving, but persistent mild cough (no hemoptysis) and chest discomfort. Denies night sweats or unexpected weight loss.   No fevers/chills, no significant productive cough.  Has seen cardiology who thought scapular pain likely MSK but also recommended stress test. This is to be rescheduled.   H/o pneumonia with L lung collapse several years ago. Still need pneumococcal vaccine.  h/o levaquin, doxy, PCN allergy.  He also noticed grape sized nodule pop up R lateral chest after IV contrast for recent CTA lungs r/o PE. It is slowly decreasing in size.   Relevant past medical, surgical, family and social history reviewed and updated as indicated. Interim medical history since our last visit reviewed. Allergies and medications reviewed and updated. Outpatient Medications Prior to Visit  Medication Sig Dispense Refill  . aspirin EC 81 MG tablet Take 1 tablet (81 mg total) by mouth daily. 90 tablet 3  . Omega-3 Fatty Acids (FISH OIL BURP-LESS) 1000 MG CAPS Take 1 capsule by mouth 3 (three) times daily after meals.    . methocarbamol (ROBAXIN) 500 MG tablet Take 0.5-1 tablets (250-500 mg total) by mouth 3 (three) times daily as needed for muscle spasms. (Patient not taking: Reported on 05/05/2016) 30 tablet 1  . nitroGLYCERIN (NITROSTAT) 0.4 MG SL tablet Place 1 tablet (0.4 mg total)  under the tongue every 5 (five) minutes as needed for chest pain. (Patient not taking: Reported on 04/20/2016) 25 tablet 3  . doxycycline (VIBRA-TABS) 100 MG tablet Take 1 tablet (100 mg total) by mouth 2 (two) times daily. 20 tablet 0   No facility-administered medications prior to visit.      Per HPI unless specifically indicated in ROS section below Review of Systems     Objective:    BP (!) 166/82   Pulse 76   Temp 97.8 F (36.6 C) (Oral)   Wt 214 lb (97.1 kg)   SpO2 94%   BMI 28.23 kg/m   Wt Readings from Last 3 Encounters:  05/05/16 214 lb (97.1 kg)  04/20/16 212 lb 4 oz (96.3 kg)  02/27/16 215 lb (97.5 kg)    Physical Exam  Constitutional: He appears well-developed and well-nourished. No distress.  HENT:  Head: Normocephalic and atraumatic.  Mouth/Throat: Oropharynx is clear and moist. No oropharyngeal exudate.  Cardiovascular: Normal rate, regular rhythm, normal heart sounds and intact distal pulses.   No murmur heard. Pulmonary/Chest: Effort normal and breath sounds normal. No respiratory distress. He has no wheezes. He has no rales.  Coarse with diminished breath sounds at LLL otherwise clear Small pea sized slightly tender nodule R lateral pectoral region, no surrounding erythema  Musculoskeletal: He exhibits no edema.  Lymphadenopathy:    He has axillary adenopathy.       Right axillary: Pectoral  adenopathy present. No lateral adenopathy present.       Left axillary: No pectoral and no lateral adenopathy present. Nursing note and vitals reviewed.  Results for orders placed or performed during the hospital encounter of 04/21/16  I-STAT creatinine  Result Value Ref Range   Creatinine, Ser 0.90 0.61 - 1.24 mg/dL   CHEST  2 VIEW COMPARISON:  Two-view chest x-ray a 06/04/2015. FINDINGS: The heart size is normal. A a focal opacity is noted laterally in the left lower lobe. No other focal airspace disease is present. There is no edema or effusion.  Atherosclerotic calcifications are present at the aortic arch. Degenerative changes are noted in the thoracic spine. IMPRESSION: 1. Posterior left lower lobe airspace opacity is most concerning for pneumonia. Recommend follow-up chest radiograph to assure resolution of the opacity following appropriate therapy. If the opacity does not clearly an 3- 6 weeks, CT could be used for further evaluation. 2. Aortic atherosclerosis. Electronically Signed   By: San Morelle M.D.   On: 04/21/2016 08:27    Assessment & Plan:   Problem List Items Addressed This Visit    Axillary lymphadenopathy    R lateral pectoral LN. Anticipate localized reaction with swollen lymph node after IV contrast - but not true allergic reaction so likely ok to receive contrast in the future. Anticipate will fully resolve over 2 wks.       Left lower lobe pneumonia ([place]) - Primary    L scapular pain is resolving after treatment of LLL PNA with doxy 10d course. Pt wanted to ensure he is on the mend. Reassured. rec return 2 wks for f/u CXR. Pt and wife agree with plan.           Follow up plan: Return in about 2 weeks (around 05/19/2016).  Ria Bush, MD

## 2016-05-06 NOTE — Telephone Encounter (Signed)
  05/05/2016 09:06 AM Phone (Outgoing) Tylek, Boney (Self) [phone] (M)   Left Message - Called pt and lmsg for him to CB to get rescheduled for myoview.    By Verdene Rio

## 2016-05-13 NOTE — Telephone Encounter (Signed)
Left pt message asking to call Allison back directly at [phone] to schedule AWV/CPE with PCP. °

## 2016-05-19 ENCOUNTER — Telehealth (HOSPITAL_COMMUNITY): Payer: Self-pay | Admitting: Physician Assistant

## 2016-05-19 ENCOUNTER — Ambulatory Visit (INDEPENDENT_AMBULATORY_CARE_PROVIDER_SITE_OTHER)
Admission: RE | Admit: 2016-05-19 | Discharge: 2016-05-19 | Disposition: A | Discharge status: Home / Self Care | Payer: Medicare Other | Source: Ambulatory Visit | Attending: Family Medicine | Admitting: Family Medicine

## 2016-05-19 ENCOUNTER — Other Ambulatory Visit: Payer: Self-pay | Admitting: Family Medicine

## 2016-05-19 DIAGNOSIS — J181 Lobar pneumonia, unspecified organism: Secondary | ICD-10-CM

## 2016-05-19 DIAGNOSIS — J189 Pneumonia, unspecified organism: Secondary | ICD-10-CM

## 2016-05-19 NOTE — Telephone Encounter (Signed)
User: Cherie Dark A Date/time: 05/05/2016 9:14 AM  Comment: Called pt and lmsg for him to CB to r/s myoview  Context: [place] Schedule Orders/Appt Requests Outcome: Left Message  Phone number: [phone] Phone Type: Home Phone  Comm. type: Telephone Call type: Outgoing  Contact: [name] Relation to patient: Self  Letter:      User: Cherie Dark A Date/time: 04/07/2016 1:56 PM  Comment: Called pt and spoke with him and he voiced that he needed to speak with his wife first..he will CB.   Context: [place] Schedule Orders/Appt Requests Outcome: Completed  Phone number: [phone] Phone Type: Home Phone  Comm. type: Telephone Call type: Outgoing  Contact: [name] L Relation to patient: Self  Letter:

## 2016-05-20 ENCOUNTER — Other Ambulatory Visit: Payer: Self-pay | Admitting: Family Medicine

## 2016-05-20 DIAGNOSIS — R918 Other nonspecific abnormal finding of lung field: Secondary | ICD-10-CM

## 2016-05-20 DIAGNOSIS — R911 Solitary pulmonary nodule: Secondary | ICD-10-CM | POA: Insufficient documentation

## 2016-05-21 ENCOUNTER — Encounter: Payer: Self-pay | Admitting: Internal Medicine

## 2016-05-21 ENCOUNTER — Ambulatory Visit (INDEPENDENT_AMBULATORY_CARE_PROVIDER_SITE_OTHER): Payer: Medicare Other | Admitting: Internal Medicine

## 2016-05-21 VITALS — BP 172/90 | HR 71 | Ht 73.0 in | Wt 211.0 lb

## 2016-05-21 DIAGNOSIS — Z8701 Personal history of pneumonia (recurrent): Secondary | ICD-10-CM | POA: Diagnosis not present

## 2016-05-21 DIAGNOSIS — I10 Essential (primary) hypertension: Secondary | ICD-10-CM

## 2016-05-21 DIAGNOSIS — J181 Lobar pneumonia, unspecified organism: Secondary | ICD-10-CM

## 2016-05-21 DIAGNOSIS — J189 Pneumonia, unspecified organism: Secondary | ICD-10-CM

## 2016-05-21 NOTE — Progress Notes (Signed)
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 Pulmonary Consult    Referred by Dr. Danise Mina for eval of lung mass. Pt states had PNA recently and today his breathing is at baseline for him. He states before his PNA he painted a car and he wore a mask, but did not realize there was a hole in it. 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   onset of symptoms was abrupt one day p painting car with new acetone based paint then w/in 24h L posterolateral pleuritic cp/nasal congestion with epistaxis and hemoptysis >  eval 04/20/16 with cxr /CT? pna neg PE >rx Doxy x 10 DAYS > ALL BETTER symptomatically in terms of the cp/ hemoptysis  Still has a little bloody nasal discharge  Has h/o TB exp but took INH x 1.5 years  X decades prior to OV     No obvious day to day or daytime variability or assoc excess/ purulent sputum or mucus plugs or hemoptysis or cp or chest tightness, subjective wheeze or overt sinus or hb symptoms. No unusual exp hx or h/o childhood pna/ asthma or knowledge of premature birth.  Sleeping ok without nocturnal  or early am exacerbation  of respiratory  c/o's or need for noct saba. Also denies any obvious fluctuation of symptoms with weather or environmental changes or other aggravating or alleviating factors except as outlined above   Current Medications, Allergies, Complete Past Medical History, Past Surgical History, Family History, and Social History were reviewed in [employer] record.  ROS  The following are not active complaints unless bolded sore throat, dysphagia, dental problems, itching,  sneezing,  nasal congestion or excess/ purulent secretions, ear ache,   fever, chills, sweats, unintended wt loss, classically pleuritic or exertional cp,  orthopnea pnd or leg swelling, presyncope, palpitations, abdominal pain, anorexia, nausea, vomiting, diarrhea  or change in bowel or bladder habits, change in stools or urine, dysuria,hematuria,  rash, arthralgias, visual complaints, headache, numbness, weakness or ataxia or problems with walking or coordination,  change in mood/affect or memory.                 Review of Systems     Objective:   Physical Exam amb pleasant wm nad   Wt Readings from Last 3 Encounters:  05/21/16 211 lb (95.7 kg)  05/05/16 214 lb (97.1 kg)  04/20/16 212 lb 4 oz (96.3 kg)    Vital signs reviewed - Note on arrival 02 sats  97% on RA - note bp 172/90      HEENT: nl  turbinates bilaterally, and oropharynx. Nl external ear canals without cough reflex - edentulous   NECK :  without JVD/Nodes/TM/ nl carotid upstrokes bilaterally   LUNGS: no acc muscle use,  Nl contour chest which is clear to A and P bilaterally without cough on insp or exp maneuvers   CV:  RRR  no s3 or murmur or increase in P2, and no edema   ABD:  soft and nontender with nl inspiratory excursion  in the supine position. No bruits or organomegaly appreciated, bowel sounds nl  MS:  Nl gait/ ext warm without deformities, calf tenderness, cyanosis or clubbing No obvious joint restrictions   SKIN: warm and dry without lesions    NEURO:  alert, approp, nl sensorium with  no motor or cerebellar deficits apparent.     I personally reviewed images and agree with radiology impression as follows:  CTa Chest  04/21/16 1.  No evidence of acute pulmonary embolus. 2. Superior segment left lower lobe rounded 2.4 cm opacity is nonspecific and corresponds to the radiographic finding yesterday. This could represent a round pneumonia, and there is a small associated left pleural effusion.        Assessment:

## 2016-05-21 NOTE — Patient Instructions (Signed)
Take the fish oil if you must with breakfast  Or stop it and eat more baked/grilled fish (especially salmon)  to help you lose weight by replacing the calories in the fish oil  And unhealthy foods with lower calorie alternatives (like salmon)   Call if nosebleeds continue and you may need a sinus CT before next visit - hold aspirin if gets worse   Please schedule a follow up office visit in 4 weeks, sooner if needed with cxr  On return

## 2016-05-21 NOTE — Assessment & Plan Note (Signed)
Not optimally controlled on present regimen. I reviewed this with the patient and emphasized importance of follow-up with primary care.     

## 2016-05-21 NOTE — Assessment & Plan Note (Addendum)
Most likely the exposure to paint fumes is coincidental as he had a definite [employer] process on the original cxr that was smaller and more organized on the f/u cxr from 05/19/16 but is classic for a rounded resolving CAP with aspiration   in the ddx from ? Chronic sinus dz (taking fish oil at hs/ discouraged)  But very very unlikely the acute cp corresponding to the exact location of the density and responded to doxy represents any form of neoplasm in this never smoker so should just be followed with cxr in 6 weeks  Discussed in detail all the  indications, usual  risks and alternatives  relative to the benefits with patient who agrees to proceed with conservative f/u as outlined    Total time devoted to counseling  > 50 % of initial 45 min office visit:  review case with pt/ discussion of options/alternatives/ personally creating written customized instructions  in presence of pt  then going over those specific  Instructions directly with the pt including how to use all of the meds but in particular covering each new medication in detail and the difference between the maintenance= "automatic" meds and the prns using an action plan format for the latter (If this problem/symptom => do that organization reading Left to right).  Please see AVS from this visit for a full list of these instructions which I personally wrote for this pt and  are unique to this visit.

## 2016-06-18 ENCOUNTER — Other Ambulatory Visit: Payer: Self-pay

## 2016-06-18 DIAGNOSIS — J181 Lobar pneumonia, unspecified organism: Principal | ICD-10-CM

## 2016-06-18 DIAGNOSIS — J189 Pneumonia, unspecified organism: Secondary | ICD-10-CM

## 2016-06-19 ENCOUNTER — Ambulatory Visit (INDEPENDENT_AMBULATORY_CARE_PROVIDER_SITE_OTHER): Payer: Medicare Other | Admitting: Internal Medicine

## 2016-06-19 ENCOUNTER — Ambulatory Visit (INDEPENDENT_AMBULATORY_CARE_PROVIDER_SITE_OTHER)
Admission: RE | Admit: 2016-06-19 | Discharge: 2016-06-19 | Disposition: A | Discharge status: Home / Self Care | Payer: Medicare Other | Source: Ambulatory Visit | Attending: Internal Medicine | Admitting: Internal Medicine

## 2016-06-19 ENCOUNTER — Encounter: Payer: Self-pay | Admitting: Internal Medicine

## 2016-06-19 VITALS — BP 172/108 | HR 88 | Temp 97.8°F | Ht 73.0 in | Wt 209.0 lb

## 2016-06-19 DIAGNOSIS — R918 Other nonspecific abnormal finding of lung field: Secondary | ICD-10-CM | POA: Diagnosis not present

## 2016-06-19 DIAGNOSIS — J181 Lobar pneumonia, unspecified organism: Secondary | ICD-10-CM

## 2016-06-19 DIAGNOSIS — I10 Essential (primary) hypertension: Secondary | ICD-10-CM

## 2016-06-19 DIAGNOSIS — J189 Pneumonia, unspecified organism: Secondary | ICD-10-CM

## 2016-06-19 MED ORDER — NEBIVOLOL HCL 5 MG PO TABS: 10.0000 mg | ORAL_TABLET | Freq: Every day | ORAL | 0 refills | Status: DC

## 2016-06-19 NOTE — Progress Notes (Signed)
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 Pulmonary Consult    Referred by Dr. Danise Mina for eval of lung mass. Pt states had PNA recently and today his breathing is at baseline for him. He states before his PNA he painted a car and he wore a mask, but did not realize there was a hole in it. 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   onset of symptoms was abrupt one day p painting car with new acetone based paint then w/in 24h L posterolateral pleuritic cp/nasal congestion with epistaxis and hemoptysis >  eval 04/20/16 with cxr /CT? 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 Follow-up    CXR repeated today. He states "my breathing is fine". No new co's today. He is down 2 lbs since last ov 05/21/16.   ha is worse in pm's variable seems worse in pm  L cp has not recurred/ no sob or cough  No obvious day to day or daytime variability or assoc excess/ purulent sputum or mucus plugs or hemoptysis or cp or chest  tightness, subjective wheeze or overt sinus or hb symptoms. No unusual exp hx or h/o childhood pna/ asthma or knowledge of premature birth.  Sleeping ok without nocturnal  or early am exacerbation  of respiratory  c/o's or need for noct saba. Also denies any obvious fluctuation of symptoms with weather or environmental changes or other aggravating or alleviating factors except as outlined above   Current Medications, Allergies, Complete Past Medical History, Past Surgical History, Family History, and Social History were reviewed in [employer] record.  ROS  The following are not active complaints unless bolded sore throat, dysphagia, dental problems, itching, sneezing,  nasal congestion or excess/ purulent secretions, ear ache,   fever, chills, sweats, unintended wt loss, classically pleuritic or exertional cp,  orthopnea pnd or leg swelling, presyncope, palpitations, abdominal pain, anorexia, nausea, vomiting, diarrhea  or change in bowel or bladder habits, change in stools or urine, dysuria,hematuria,  Rash resolving , arthralgias, visual complaints, headache, numbness, weakness or ataxia or problems with walking or coordination,  change in mood/affect or memory.                    Objective:   Physical  Exam  amb pleasant wm nad   06/19/2016      05/21/16 211 lb (95.7 kg)  05/05/16 214 lb (97.1 kg)  04/20/16 212 lb 4 oz (96.3 kg)    Vital signs reviewed     - Note on arrival BP 172/108 and P  88     HEENT: nl  turbinates bilaterally, and oropharynx. Nl external ear canals without cough reflex - edentulous   NECK :  without JVD/Nodes/TM/ nl carotid upstrokes bilaterally   LUNGS: no acc muscle use,  Nl contour chest which is clear to A and P bilaterally without cough on insp or exp maneuvers   CV:  RRR  no s3 or murmur or increase in P2, and no edema   ABD:  soft and nontender with nl inspiratory excursion in the supine position. No bruits or  organomegaly appreciated, bowel sounds nl  MS:  Nl gait/ ext warm without deformities, calf tenderness, cyanosis or clubbing No obvious joint restrictions   SKIN: warm and dry with mulitple < dime sized healing ulcerations c/w insect bites    NEURO:  alert, approp, nl sensorium with  no motor or cerebellar deficits apparent.    CXR PA and Lateral:   06/19/2016 :    I personally reviewed images and agree with radiology impression as follows:   No change dense LLL nodule       Assessment:

## 2016-06-19 NOTE — Patient Instructions (Addendum)
bystolic 10 mg daily until you see your primary care doctor w/in next couple of weeks  Please see patient coordinator before you leave today  to schedule PET scan - if lights up then I will recommend it be removed and if it doesn't I will recommend continued surveillance here in 3 months

## 2016-06-20 NOTE — Assessment & Plan Note (Signed)
CTa Chest  04/21/16 1. No evidence of acute pulmonary embolus. 2. Superior segment left lower lobe rounded 2.4 cm opacity is nonspecific and corresponds to the radiographic finding yesterday. This could represent a round pneumonia, and there is a small associated left pleural effusion.  - cxr 06/19/2016 more dense, localized > rec PET >>>  At this point the nodule should be improving and not getting more dense if it was an infection which was initially suggested by the assoc acute symptoms so the dx is in doubt > pet next step  Discussed in detail all the  indications, usual  risks and alternatives  relative to the benefits with patient who agrees to proceed with conservative f/u as outlined    I had an extended discussion with the patient reviewing all relevant studies completed to date and  lasting 15 to 20 minutes of a 25 minute visit    Each maintenance medication was reviewed in detail including most importantly the difference between maintenance and prns and under what circumstances the prns are to be triggered using an action plan format that is not reflected in the computer generated alphabetically organized AVS.    Please see AVS for specific instructions unique to this visit that I personally wrote and verbalized to the the pt in detail and then reviewed with pt  by my nurse highlighting any  changes in therapy recommended at today's visit to their plan of care.

## 2016-06-20 NOTE — Assessment & Plan Note (Signed)
Added bystolic 10 mg samples >  Follow up per Primary Care planned  Before the samples run out

## 2016-06-20 NOTE — Assessment & Plan Note (Signed)
PET ordered and if Pos rec excisional bx/ if neg f/u here in 3 m

## 2016-06-30 ENCOUNTER — Ambulatory Visit (HOSPITAL_COMMUNITY)
Admission: RE | Admit: 2016-06-30 | Discharge: 2016-06-30 | Disposition: A | Discharge status: Home / Self Care | Payer: Medicare Other | Source: Ambulatory Visit | Attending: Internal Medicine | Admitting: Internal Medicine

## 2016-06-30 DIAGNOSIS — R933 Abnormal findings on diagnostic imaging of other parts of digestive tract: Secondary | ICD-10-CM | POA: Diagnosis not present

## 2016-06-30 DIAGNOSIS — I700 Atherosclerosis of aorta: Secondary | ICD-10-CM | POA: Diagnosis not present

## 2016-06-30 DIAGNOSIS — R911 Solitary pulmonary nodule: Secondary | ICD-10-CM | POA: Diagnosis not present

## 2016-06-30 DIAGNOSIS — R918 Other nonspecific abnormal finding of lung field: Secondary | ICD-10-CM | POA: Insufficient documentation

## 2016-06-30 LAB — GLUCOSE, CAPILLARY: Glucose-Capillary: 104 mg/dL — ABNORMAL HIGH (ref 65–99)

## 2016-06-30 MED ORDER — FLUDEOXYGLUCOSE F - 18 (FDG) INJECTION
10.5700 | Freq: Once | INTRAVENOUS | Status: AC | PRN
  Administered 2016-06-30: 10.57 via INTRAVENOUS

## 2016-06-30 NOTE — Progress Notes (Signed)
Spoke with pt and notified of results per Dr. Wert. Pt verbalized understanding and denied any questions. 

## 2016-07-23 ENCOUNTER — Telehealth: Payer: Self-pay

## 2016-07-23 NOTE — Telephone Encounter (Signed)
Pt walked in the office with R swollen hand he said he fell yesterday around 1pm and hit his hand on concrete it didn't start swelling until today he said he's been taking tylenol for the pain. Webb Silversmith pt's PCP was notified okay to schedule pt tomorrow if he can't wait until then pt is go to ED/urgent care appt scheduled for 07/24/16 with Dr. Silvio Pate and pt verbalized understanding.

## 2016-07-24 ENCOUNTER — Encounter: Payer: Self-pay | Admitting: Internal Medicine

## 2016-07-24 ENCOUNTER — Telehealth: Payer: Self-pay

## 2016-07-24 ENCOUNTER — Ambulatory Visit (INDEPENDENT_AMBULATORY_CARE_PROVIDER_SITE_OTHER): Payer: Medicare Other | Admitting: Internal Medicine

## 2016-07-24 ENCOUNTER — Ambulatory Visit (INDEPENDENT_AMBULATORY_CARE_PROVIDER_SITE_OTHER)
Admission: RE | Admit: 2016-07-24 | Discharge: 2016-07-24 | Disposition: A | Discharge status: Home / Self Care | Payer: Medicare Other | Source: Ambulatory Visit | Attending: Internal Medicine | Admitting: Internal Medicine

## 2016-07-24 VITALS — BP 148/90 | HR 90 | Temp 98.1°F | Wt 212.0 lb

## 2016-07-24 DIAGNOSIS — S6991XA Unspecified injury of right wrist, hand and finger(s), initial encounter: Secondary | ICD-10-CM | POA: Diagnosis not present

## 2016-07-24 DIAGNOSIS — M79641 Pain in right hand: Secondary | ICD-10-CM | POA: Diagnosis not present

## 2016-07-24 DIAGNOSIS — S52209A Unspecified fracture of shaft of unspecified ulna, initial encounter for closed fracture: Secondary | ICD-10-CM | POA: Insufficient documentation

## 2016-07-24 DIAGNOSIS — S52601A Unspecified fracture of lower end of right ulna, initial encounter for closed fracture: Secondary | ICD-10-CM | POA: Diagnosis not present

## 2016-07-24 DIAGNOSIS — I10 Essential (primary) hypertension: Secondary | ICD-10-CM | POA: Diagnosis not present

## 2016-07-24 DIAGNOSIS — S52691A Other fracture of lower end of right ulna, initial encounter for closed fracture: Secondary | ICD-10-CM | POA: Diagnosis not present

## 2016-07-24 MED ORDER — METOPROLOL SUCCINATE ER 50 MG PO TB24: 50.0000 mg | ORAL_TABLET | Freq: Every day | ORAL | 3 refills | Status: DC

## 2016-07-24 NOTE — Assessment & Plan Note (Signed)
Impacted distal ulnar fracture Will put in brace today Set up with ortho next week

## 2016-07-24 NOTE — Telephone Encounter (Signed)
Erline Levine with [facility] called report on rt hand xray; report is in [place] and report taken to Dr Alla German area.

## 2016-07-24 NOTE — Assessment & Plan Note (Signed)
Marked findings X-ray

## 2016-07-24 NOTE — Assessment & Plan Note (Signed)
BP Readings from Last 3 Encounters:  07/24/16 (!) 148/90  06/19/16 (!) 172/108  05/21/16 (!) 172/90   Better today Will change to metoprolol so not too expensive

## 2016-07-24 NOTE — Progress Notes (Signed)
   Subjective:    Patient ID: Colin Lopez, male    DOB: December 11, 1940, 76 y.o.   MRN: [id number]  HPI Here due to hand pain  [place] on concrete 2 days ago Hit chin but broke his fall with right hand Has been soaking it in ice Massive swelling which is better now Still painful and tingling Able to bend fingers only slightly  Put on bystolic by Dr Gus Rankin out of samples No chest pain No SOB No dizziness or syncope  Current Outpatient Prescriptions on File Prior to Visit  Medication Sig Dispense Refill  . aspirin EC 81 MG tablet Take 1 tablet (81 mg total) by mouth daily. 90 tablet 3  . methocarbamol (ROBAXIN) 500 MG tablet Take 0.5-1 tablets (250-500 mg total) by mouth 3 (three) times daily as needed for muscle spasms. 30 tablet 1  . nebivolol (BYSTOLIC) 5 MG tablet Take 2 tablets (10 mg total) by mouth daily. 6 tablet 0  . nitroGLYCERIN (NITROSTAT) 0.4 MG SL tablet Place 1 tablet (0.4 mg total) under the tongue every 5 (five) minutes as needed for chest pain. 25 tablet 3  . Omega-3 Fatty Acids (FISH OIL BURP-LESS) 1000 MG CAPS Take 1 capsule by mouth 3 (three) times daily after meals.     No current facility-administered medications on file prior to visit.     Allergies  Allergen Reactions  . Levaquin [Levofloxacin In D5w]     Rash  . Penicillins Hives  . Doxycycline Rash    Water blisters    Past Medical History:  Diagnosis Date  . Coronary disease    Status post stenting of the left circumflex coronary in 2009 with a bare-metal stent (with a 3.5x47mm Liberte stent)  . Dyslipidemia   . 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 Hyperlipidemia   . Hypertension   . NSTEMI (non-ST elevated myocardial infarction) ([place]) 2009   BMS CFX    Past Surgical History:  Procedure Laterality Date  . CARDIOVASCULAR STRESS TEST  10-03-08   EF 59%  . CORONARY ANGIOPLASTY WITH STENT PLACEMENT    . US ECHOCARDIOGRAPHY  09-21-08   EF 55-60%    Family History  Problem Relation Age of Onset  . Diabetes  Mother   . CAD Sister 79       MI, obese  . Cancer Brother        stomach    Social History   Social History  . Marital status: Married    Spouse name: N/A  . Number of children: 6  . Years of education: N/A   Occupational History  . upholstery    Social History Main Topics  . Smoking status: Never Smoker  . Smokeless tobacco: Never Used  . Alcohol use Yes     Comment: Rarely  . Drug use: Yes  . Sexual activity: Not Currently   Other Topics Concern  . Not on file   Social History Narrative  . No narrative on file   Review of Systems  Sleeps fair Appetite is fine     Objective:   Physical Exam  Constitutional: He appears well-nourished. No distress.  Musculoskeletal:  Marked swelling and bruising over wrist and entire hand/fingers out to DIPs Tenderness at wrist, MCP and PIPs          Assessment & Plan:

## 2016-07-24 NOTE — Telephone Encounter (Signed)
Wrist was splinted and appt set up with ortho

## 2016-07-24 NOTE — Addendum Note (Signed)
Addended by: Pilar Grammes on: 07/24/2016 11:53 AM   Modules accepted: Orders

## 2016-08-17 DIAGNOSIS — S52691D Other fracture of lower end of right ulna, subsequent encounter for closed fracture with routine healing: Secondary | ICD-10-CM | POA: Diagnosis not present

## 2016-08-24 NOTE — Progress Notes (Signed)
 Colin Lopez Date of Birth: 11/16/1940 Medical Record #[id number]  History of Present Illness: Colin Lopez is seen for  followup CAD. He has a history of coronary disease with a non-ST elevation myocardial infarction in 2009. He underwent stenting of the left circumflex coronary with a 3.5 x 16 mm Liberte stent. He does have a history of hypertension and hyperlipidemia. He is intolerant to Crestor.  He was seen in February with some atypical left shoulder and neck pain. He was tachycardic. A Myoview study was ordered but was never done. He had a CT chest in April which was negative for PE. There was a rounded density in the LLL of unclear etiology. Later PET scan in June showed low metabolic activity and low risk. He was seen by Colin Lopez. BP was high and he was given Bystolic samples to take. Never filled Rx and when seen by Colin Lopez given metoprolol. Quit taking this because he thought it caused indigestion. Notes severe symptoms of acid reflux over the past 2 months with burning in his throat and then bad chest pressure like it is going to explode after eating. Some relief with Zantac. Wife recommended vinegar but this has not helped.   He does note more DOE. More fatigued. BP has been persistently high. He did tolerate Bystolic well.   Current Outpatient Prescriptions on File Prior to Visit  Medication Sig Dispense Refill  . aspirin EC 81 MG tablet Take 1 tablet (81 mg total) by mouth daily. 90 tablet 3  . nitroGLYCERIN (NITROSTAT) 0.4 MG SL tablet Place 1 tablet (0.4 mg total) under the tongue every 5 (five) minutes as needed for chest pain. 25 tablet 3  . Omega-3 Fatty Acids (FISH OIL BURP-LESS) 1000 MG CAPS Take 1 capsule by mouth 3 (three) times daily after meals.     No current facility-administered medications on file prior to visit.     Allergies  Allergen Reactions  . Levaquin [Levofloxacin In D5w]     Rash  . Penicillins Hives  . Doxycycline Rash    Water blisters    Past  Medical History:  Diagnosis Date  . Coronary disease    Status post stenting of the left circumflex coronary in 2009 with a bare-metal stent (with a 3.5x16mm Liberte stent)  . Dyslipidemia   . 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 Hyperlipidemia   . Hypertension   . NSTEMI (non-ST elevated myocardial infarction) ([facility]) 2009   BMS CFX    Past Surgical History:  Procedure Laterality Date  . CARDIOVASCULAR STRESS TEST  10-03-08   EF 59%  . CORONARY ANGIOPLASTY WITH STENT PLACEMENT    . US ECHOCARDIOGRAPHY  09-21-08   EF 55-60%    History  Smoking Status  . Never Smoker  Smokeless Tobacco  . Never Used    History  Alcohol Use  . Yes    Comment: Rarely    Family History  Problem Relation Age of Onset  . Diabetes Mother   . CAD Sister 41       MI, obese  . Cancer Brother        stomach    Review of Systems: As noted in history of present illness.  All other systems were reviewed and are negative.  Physical Exam: BP (!) 162/98   Pulse (!) 102   Ht 6' 1" (1.854 m)   Wt 210 lb (95.3 kg)   SpO2 93%   BMI 27.71 kg/m  He is a WDWM in   NAD. The HEENT exam is normal.  He is hard of hearing. The carotids are 2+ without bruits.  There is no thyromegaly.  There is no JVD.  The lungs are clear.    The heart exam reveals a regular rate with a normal S1 and S2.  There are no murmurs, gallops, or rubs.  The PMI is not displaced.   Abdominal exam reveals good bowel sounds.  .  There are no masses.  Exam of the legs reveal no clubbing, cyanosis, or edema. There is swelling of the right knee patellar bursa. The distal pulses are intact.  Cranial nerves II - XII are intact.    LABORATORY DATA:   Lab Results  Component Value Date   WBC 9.9 04/20/2016   HGB 14.7 04/20/2016   HCT 42.5 04/20/2016   PLT 162.0 04/20/2016   GLUCOSE 120 (H) 12/17/2014   CHOL 283 (H) 12/17/2014   TRIG (H) 12/17/2014    1196.0 Triglyceride is over 400; calculations on Lipids are invalid.   HDL 27.90 (L) 12/17/2014    LDLDIRECT 74.0 12/17/2014   LDLCALC 84 07/12/2013   ALT 14 12/17/2014   AST 13 12/17/2014   NA 136 12/17/2014   K 4.0 12/17/2014   CL 100 12/17/2014   CREATININE 0.90 04/21/2016   BUN 19 12/17/2014   CO2 27 12/17/2014   TSH 2.200 07/12/2013   PSA 0.27 12/17/2014   INR 1.0 03/02/2007   HGBA1C 5.7 12/17/2014    Assessment / Plan: 1. Coronary disease with prior stenting of the left circumflex coronary in 2009 with a bare-metal stent. He has some chest pain and DOE. It does appear that he has GERD but he is at least moderate risk for coronary ischemia.  I have recommended continuing ASA at 81 mg daily. Will schedule for a Lexiscan myoview study since he is unable to exercise on a treadmill. Will start back on Bystolic 10 mg daily. If unable to afford will need to consider alternative antihypertensive therapy. Either Coreg or an ARB would be preferable.  2. Hypertension-poorly controlled. See above. 3. Dyslipidemia. Intolerant to statins. Lipid panel ijn past showed very high triglycerides. Continue fish oil. Will check fasting lab work. May need to consider fenofibrate if triglycerides still very high. 4. 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 GERD. Prescribed Protonix 40 mg daily. Avoid acidic foods and vinegar. If symptoms do not improve will need to consider GI evaluation.  

## 2016-08-25 ENCOUNTER — Encounter: Payer: Self-pay | Admitting: Cardiology

## 2016-08-25 ENCOUNTER — Ambulatory Visit (INDEPENDENT_AMBULATORY_CARE_PROVIDER_SITE_OTHER): Payer: Medicare Other | Admitting: Cardiology

## 2016-08-25 VITALS — BP 162/98 | HR 102 | Ht 73.0 in | Wt 210.0 lb

## 2016-08-25 DIAGNOSIS — I2510 Atherosclerotic heart disease of native coronary artery without angina pectoris: Secondary | ICD-10-CM

## 2016-08-25 DIAGNOSIS — I10 Essential (primary) hypertension: Secondary | ICD-10-CM | POA: Diagnosis not present

## 2016-08-25 DIAGNOSIS — E781 Pure hyperglyceridemia: Secondary | ICD-10-CM | POA: Diagnosis not present

## 2016-08-25 DIAGNOSIS — R079 Chest pain, unspecified: Secondary | ICD-10-CM | POA: Diagnosis not present

## 2016-08-25 DIAGNOSIS — E785 Hyperlipidemia, unspecified: Secondary | ICD-10-CM

## 2016-08-25 MED ORDER — PANTOPRAZOLE SODIUM 40 MG PO TBEC: 40.0000 mg | DELAYED_RELEASE_TABLET | Freq: Every day | ORAL | 11 refills | Status: DC

## 2016-08-25 MED ORDER — NEBIVOLOL HCL 10 MG PO TABS: 10.0000 mg | ORAL_TABLET | Freq: Every day | ORAL | 11 refills | Status: DC

## 2016-08-25 NOTE — Patient Instructions (Signed)
Start Bystolic 10 mg daily for blood pressure control  Start Protonix 40 mg daily for acid reflux  We will schedule you for a nuclear stress test and fasting lab work  I will follow up in 4 weeks.

## 2016-08-26 ENCOUNTER — Encounter: Payer: Self-pay | Admitting: Cardiology

## 2016-08-26 ENCOUNTER — Other Ambulatory Visit: Payer: Self-pay

## 2016-08-26 MED ORDER — NEBIVOLOL HCL 10 MG PO TABS: 10.0000 mg | ORAL_TABLET | Freq: Every day | ORAL | 3 refills | Status: DC

## 2016-09-01 ENCOUNTER — Telehealth (HOSPITAL_COMMUNITY): Payer: Self-pay

## 2016-09-01 NOTE — Telephone Encounter (Signed)
Encounter complete. 

## 2016-09-02 ENCOUNTER — Telehealth (HOSPITAL_COMMUNITY): Payer: Self-pay

## 2016-09-02 NOTE — Telephone Encounter (Signed)
Encounter complete. 

## 2016-09-03 ENCOUNTER — Ambulatory Visit (HOSPITAL_COMMUNITY)
Admission: RE | Admit: 2016-09-03 | Discharge: 2016-09-03 | Disposition: A | Discharge status: Home / Self Care | Payer: Medicare Other | Source: Ambulatory Visit | Attending: Cardiology | Admitting: Cardiology

## 2016-09-03 DIAGNOSIS — E785 Hyperlipidemia, unspecified: Secondary | ICD-10-CM

## 2016-09-03 DIAGNOSIS — I10 Essential (primary) hypertension: Secondary | ICD-10-CM | POA: Diagnosis not present

## 2016-09-03 DIAGNOSIS — R9439 Abnormal result of other cardiovascular function study: Secondary | ICD-10-CM | POA: Insufficient documentation

## 2016-09-03 DIAGNOSIS — E781 Pure hyperglyceridemia: Secondary | ICD-10-CM | POA: Diagnosis not present

## 2016-09-03 DIAGNOSIS — R079 Chest pain, unspecified: Secondary | ICD-10-CM | POA: Diagnosis not present

## 2016-09-03 DIAGNOSIS — I2510 Atherosclerotic heart disease of native coronary artery without angina pectoris: Secondary | ICD-10-CM

## 2016-09-03 LAB — BASIC METABOLIC PANEL
BUN/Creatinine Ratio: 24 (ref 10–24)
BUN: 24 mg/dL (ref 8–27)
CO2: 23 mmol/L (ref 20–29)
Calcium: 9.1 mg/dL (ref 8.6–10.2)
Chloride: 102 mmol/L (ref 96–106)
Creatinine, Ser: 0.98 mg/dL (ref 0.76–1.27)
GFR calc Af Amer: 86 mL/min/{1.73_m2} (ref >59)
GFR calc non Af Amer: 75 mL/min/{1.73_m2} (ref >59)
Glucose: 103 mg/dL — ABNORMAL HIGH (ref 65–99)
Potassium: 4.8 mmol/L (ref 3.5–5.2)
Sodium: 139 mmol/L (ref 134–144)

## 2016-09-03 LAB — MYOCARDIAL PERFUSION IMAGING
LV dias vol: 153 mL (ref 62–150)
LV sys vol: 96 mL
Peak HR: 74 {beats}/min
Rest HR: 51 {beats}/min
SDS: 3
SRS: 6
SSS: 9
TID: 1.04

## 2016-09-03 LAB — LIPID PANEL W/O CHOL/HDL RATIO
Cholesterol, Total: 202 mg/dL — ABNORMAL HIGH (ref 100–199)
HDL: 30 mg/dL — ABNORMAL LOW (ref >39)
LDL Calculated: 99 mg/dL (ref 0–99)
Triglycerides: 367 mg/dL — ABNORMAL HIGH (ref 0–149)
VLDL Cholesterol Cal: 73 mg/dL — ABNORMAL HIGH (ref 5–40)

## 2016-09-03 LAB — HEPATIC FUNCTION PANEL
ALT: 17 IU/L (ref 0–44)
AST: 26 IU/L (ref 0–40)
Albumin: 4.3 g/dL (ref 3.5–4.8)
Alkaline Phosphatase: 101 IU/L (ref 39–117)
Bilirubin Total: 0.5 mg/dL (ref 0.0–1.2)
Bilirubin, Direct: 0.15 mg/dL (ref 0.00–0.40)
Total Protein: 6.6 g/dL (ref 6.0–8.5)

## 2016-09-03 MED ORDER — REGADENOSON 0.4 MG/5ML IV SOLN
0.4000 mg | Freq: Once | INTRAVENOUS | Status: AC
  Administered 2016-09-03: 0.4 mg via INTRAVENOUS

## 2016-09-03 MED ORDER — TECHNETIUM TC 99M TETROFOSMIN IV KIT
10.0000 | PACK | Freq: Once | INTRAVENOUS | Status: AC | PRN
  Administered 2016-09-03: 10 via INTRAVENOUS
  Filled 2016-09-03: qty 10

## 2016-09-03 MED ORDER — TECHNETIUM TC 99M TETROFOSMIN IV KIT
31.0000 | PACK | Freq: Once | INTRAVENOUS | Status: AC | PRN
  Administered 2016-09-03: 31 via INTRAVENOUS
  Filled 2016-09-03: qty 31

## 2016-09-04 ENCOUNTER — Telehealth: Payer: Self-pay | Admitting: Cardiology

## 2016-09-04 NOTE — Telephone Encounter (Signed)
New message    Pt wife is returning call to nurse, please call.

## 2016-09-04 NOTE — Telephone Encounter (Signed)
Received call from patient's wife.Myoview results given.She stated husband not at home.She will have him call me Monday 8/27 to schedule cardiac cath.

## 2016-09-07 ENCOUNTER — Telehealth: Payer: Self-pay | Admitting: Cardiology

## 2016-09-07 ENCOUNTER — Other Ambulatory Visit: Payer: Self-pay | Admitting: Cardiology

## 2016-09-07 ENCOUNTER — Telehealth: Payer: Self-pay

## 2016-09-07 ENCOUNTER — Other Ambulatory Visit: Payer: Self-pay

## 2016-09-07 DIAGNOSIS — I2510 Atherosclerotic heart disease of native coronary artery without angina pectoris: Secondary | ICD-10-CM

## 2016-09-07 DIAGNOSIS — Z01812 Encounter for preprocedural laboratory examination: Secondary | ICD-10-CM

## 2016-09-07 DIAGNOSIS — R9439 Abnormal result of other cardiovascular function study: Secondary | ICD-10-CM

## 2016-09-07 NOTE — Telephone Encounter (Signed)
Patient called and made aware of results. He verbalized his understanding.  Notes recorded by [name], [name] M, MD on 09/03/2016 at 5:07 PM EDT Chemistries are normal. Triglycerides have improved significantly. [id number]. Would continue fish oil supplement and healthy diet.

## 2016-09-07 NOTE — Telephone Encounter (Signed)
Left detailed message per DPR.  Patient contacted pre-catheterization at [facility] scheduled for:  09/09/2016 @ 1130 Verified arrival time and place:  NT @ 0900 Confirmed AM meds to be taken pre-cath with sip of water: Take ASA Patient must have responsible person to drive home post procedure and observe patient for 24 hours. Addl concerns:  Left this nurse name and # for call back if any questions

## 2016-09-07 NOTE — Telephone Encounter (Signed)
New message ° ° ° ° ° °Calling to get test results °

## 2016-09-08 DIAGNOSIS — Z01812 Encounter for preprocedural laboratory examination: Secondary | ICD-10-CM | POA: Diagnosis not present

## 2016-09-08 DIAGNOSIS — I2510 Atherosclerotic heart disease of native coronary artery without angina pectoris: Secondary | ICD-10-CM | POA: Diagnosis not present

## 2016-09-08 LAB — CBC WITH DIFFERENTIAL/PLATELET
Basophils Absolute: 0 10*3/uL (ref 0.0–0.2)
Basos: 0 %
EOS (ABSOLUTE): 0.1 10*3/uL (ref 0.0–0.4)
Eos: 1 %
Hematocrit: 42.3 % (ref 37.5–51.0)
Hemoglobin: 14.3 g/dL (ref 13.0–17.7)
Immature Grans (Abs): 0 10*3/uL (ref 0.0–0.1)
Immature Granulocytes: 0 %
Lymphocytes Absolute: 1.5 10*3/uL (ref 0.7–3.1)
Lymphs: 22 %
MCH: 30 pg (ref 26.6–33.0)
MCHC: 33.8 g/dL (ref 31.5–35.7)
MCV: 89 fL (ref 79–97)
Monocytes Absolute: 0.5 10*3/uL (ref 0.1–0.9)
Monocytes: 7 %
Neutrophils Absolute: 4.7 10*3/uL (ref 1.4–7.0)
Neutrophils: 70 %
Platelets: 140 10*3/uL — ABNORMAL LOW (ref 150–379)
RBC: 4.77 x10E6/uL (ref 4.14–5.80)
RDW: 15.4 % (ref 12.3–15.4)
WBC: 6.8 10*3/uL (ref 3.4–10.8)

## 2016-09-08 LAB — PROTIME-INR
INR: 0.9 (ref 0.8–1.2)
Prothrombin Time: 9.7 s (ref 9.1–12.0)

## 2016-09-09 ENCOUNTER — Ambulatory Visit (HOSPITAL_COMMUNITY)
Admission: RE | Admit: 2016-09-09 | Discharge: 2016-09-10 | Disposition: A | Discharge status: Home / Self Care | Payer: Medicare Other | Source: Ambulatory Visit | Attending: Cardiology | Admitting: Cardiology

## 2016-09-09 ENCOUNTER — Encounter (HOSPITAL_COMMUNITY): Payer: Self-pay | Admitting: *Deleted

## 2016-09-09 ENCOUNTER — Encounter (HOSPITAL_COMMUNITY)
Admission: RE | Disposition: A | Discharge status: Home / Self Care | Payer: Self-pay | Source: Ambulatory Visit | Attending: Cardiology

## 2016-09-09 DIAGNOSIS — Z79899 Other long term (current) drug therapy: Secondary | ICD-10-CM | POA: Diagnosis not present

## 2016-09-09 DIAGNOSIS — Z833 Family history of diabetes mellitus: Secondary | ICD-10-CM | POA: Insufficient documentation

## 2016-09-09 DIAGNOSIS — I209 Angina pectoris, unspecified: Secondary | ICD-10-CM | POA: Diagnosis present

## 2016-09-09 DIAGNOSIS — K219 Gastro-esophageal reflux disease without esophagitis: Secondary | ICD-10-CM | POA: Diagnosis not present

## 2016-09-09 DIAGNOSIS — E785 Hyperlipidemia, unspecified: Secondary | ICD-10-CM | POA: Insufficient documentation

## 2016-09-09 DIAGNOSIS — Z880 Allergy status to penicillin: Secondary | ICD-10-CM | POA: Insufficient documentation

## 2016-09-09 DIAGNOSIS — Z955 Presence of coronary angioplasty implant and graft: Secondary | ICD-10-CM | POA: Diagnosis not present

## 2016-09-09 DIAGNOSIS — R9439 Abnormal result of other cardiovascular function study: Secondary | ICD-10-CM | POA: Diagnosis present

## 2016-09-09 DIAGNOSIS — E781 Pure hyperglyceridemia: Secondary | ICD-10-CM | POA: Insufficient documentation

## 2016-09-09 DIAGNOSIS — Z881 Allergy status to other antibiotic agents status: Secondary | ICD-10-CM | POA: Diagnosis not present

## 2016-09-09 DIAGNOSIS — Z888 Allergy status to other drugs, medicaments and biological substances status: Secondary | ICD-10-CM | POA: Diagnosis not present

## 2016-09-09 DIAGNOSIS — Z7982 Long term (current) use of aspirin: Secondary | ICD-10-CM | POA: Diagnosis not present

## 2016-09-09 DIAGNOSIS — I25119 Atherosclerotic heart disease of native coronary artery with unspecified angina pectoris: Secondary | ICD-10-CM | POA: Diagnosis not present

## 2016-09-09 DIAGNOSIS — Z800 Family history of malignant neoplasm of digestive organs: Secondary | ICD-10-CM | POA: Diagnosis not present

## 2016-09-09 DIAGNOSIS — R079 Chest pain, unspecified: Secondary | ICD-10-CM | POA: Diagnosis present

## 2016-09-09 DIAGNOSIS — Z8249 Family history of ischemic heart disease and other diseases of the circulatory system: Secondary | ICD-10-CM | POA: Insufficient documentation

## 2016-09-09 DIAGNOSIS — Z201 Contact with and (suspected) exposure to tuberculosis: Secondary | ICD-10-CM | POA: Insufficient documentation

## 2016-09-09 DIAGNOSIS — I2510 Atherosclerotic heart disease of native coronary artery without angina pectoris: Secondary | ICD-10-CM | POA: Diagnosis present

## 2016-09-09 DIAGNOSIS — I10 Essential (primary) hypertension: Secondary | ICD-10-CM | POA: Diagnosis present

## 2016-09-09 DIAGNOSIS — I252 Old myocardial infarction: Secondary | ICD-10-CM | POA: Insufficient documentation

## 2016-09-09 HISTORY — DX: Transient cerebral ischemic attack, unspecified: G45.9

## 2016-09-09 HISTORY — PX: CORONARY STENT INTERVENTION: CATH118234

## 2016-09-09 HISTORY — DX: Atherosclerotic heart disease of native coronary artery without angina pectoris: I25.10

## 2016-09-09 HISTORY — PX: CORONARY ANGIOPLASTY WITH STENT PLACEMENT: SHX49

## 2016-09-09 HISTORY — PX: LEFT HEART CATH AND CORONARY ANGIOGRAPHY: CATH118249

## 2016-09-09 LAB — POCT ACTIVATED CLOTTING TIME
Activated Clotting Time: 246 seconds
Activated Clotting Time: 384 seconds

## 2016-09-09 SURGERY — LEFT HEART CATH AND CORONARY ANGIOGRAPHY
Anesthesia: LOCAL

## 2016-09-09 MED ORDER — ACETAMINOPHEN 325 MG PO TABS: 650.0000 mg | ORAL_TABLET | ORAL | Status: DC | PRN

## 2016-09-09 MED ORDER — FAMOTIDINE IN NACL 20-0.9 MG/50ML-% IV SOLN
INTRAVENOUS | Status: AC | PRN
  Administered 2016-09-09: 20 mg via INTRAVENOUS

## 2016-09-09 MED ORDER — OMEGA-3-ACID ETHYL ESTERS 1 G PO CAPS
1.0000 g | ORAL_CAPSULE | Freq: Two times a day (BID) | ORAL | Status: DC
  Administered 2016-09-09: 22:00:00 1 g via ORAL
  Filled 2016-09-09 (×2): qty 1

## 2016-09-09 MED ORDER — MIDAZOLAM HCL 2 MG/2ML IJ SOLN
INTRAMUSCULAR | Status: AC
  Filled 2016-09-09: qty 2

## 2016-09-09 MED ORDER — SODIUM CHLORIDE 0.9% FLUSH: 3.0000 mL | Freq: Two times a day (BID) | INTRAVENOUS | Status: DC

## 2016-09-09 MED ORDER — SODIUM CHLORIDE 0.9 % WEIGHT BASED INFUSION: 1.0000 mL/kg/h | INTRAVENOUS | Status: DC

## 2016-09-09 MED ORDER — SODIUM CHLORIDE 0.9 % WEIGHT BASED INFUSION
1.0000 mL/kg/h | INTRAVENOUS | Status: AC
  Administered 2016-09-09: 1 mL/kg/h via INTRAVENOUS

## 2016-09-09 MED ORDER — VERAPAMIL HCL 2.5 MG/ML IV SOLN
INTRAVENOUS | Status: DC | PRN
  Administered 2016-09-09: 14:00:00 via INTRA_ARTERIAL

## 2016-09-09 MED ORDER — HEPARIN (PORCINE) IN NACL 2-0.9 UNIT/ML-% IJ SOLN
INTRAMUSCULAR | Status: AC
  Filled 2016-09-09: qty 1000

## 2016-09-09 MED ORDER — NITROGLYCERIN 0.4 MG SL SUBL: 0.4000 mg | SUBLINGUAL_TABLET | SUBLINGUAL | Status: DC | PRN

## 2016-09-09 MED ORDER — HEPARIN SODIUM (PORCINE) 1000 UNIT/ML IJ SOLN
INTRAMUSCULAR | Status: DC | PRN
  Administered 2016-09-09 (×2): 3000 [IU] via INTRAVENOUS
  Administered 2016-09-09: 5000 [IU] via INTRAVENOUS

## 2016-09-09 MED ORDER — CLOPIDOGREL BISULFATE 75 MG PO TABS
75.0000 mg | ORAL_TABLET | Freq: Every day | ORAL | Status: DC
  Administered 2016-09-10: 09:00:00 75 mg via ORAL
  Filled 2016-09-09: qty 1

## 2016-09-09 MED ORDER — SODIUM CHLORIDE 0.9% FLUSH: 3.0000 mL | INTRAVENOUS | Status: DC | PRN

## 2016-09-09 MED ORDER — FENTANYL CITRATE (PF) 100 MCG/2ML IJ SOLN
INTRAMUSCULAR | Status: AC
  Filled 2016-09-09: qty 2

## 2016-09-09 MED ORDER — CLOPIDOGREL BISULFATE 300 MG PO TABS
ORAL_TABLET | ORAL | Status: DC | PRN
  Administered 2016-09-09: 600 mg via ORAL

## 2016-09-09 MED ORDER — SODIUM CHLORIDE 0.9 % WEIGHT BASED INFUSION
3.0000 mL/kg/h | INTRAVENOUS | Status: DC
  Administered 2016-09-09: 3 mL/kg/h via INTRAVENOUS

## 2016-09-09 MED ORDER — IOPAMIDOL (ISOVUE-370) INJECTION 76%
INTRAVENOUS | Status: AC
  Filled 2016-09-09: qty 50

## 2016-09-09 MED ORDER — MIDAZOLAM HCL 2 MG/2ML IJ SOLN
INTRAMUSCULAR | Status: DC | PRN
  Administered 2016-09-09: 1 mg via INTRAVENOUS

## 2016-09-09 MED ORDER — HEPARIN (PORCINE) IN NACL 2-0.9 UNIT/ML-% IJ SOLN
INTRAMUSCULAR | Status: DC | PRN
  Administered 2016-09-09: 16:00:00

## 2016-09-09 MED ORDER — LIDOCAINE HCL (PF) 1 % IJ SOLN
INTRAMUSCULAR | Status: AC
  Filled 2016-09-09: qty 30

## 2016-09-09 MED ORDER — SODIUM CHLORIDE 0.9 % IV SOLN: 250.0000 mL | INTRAVENOUS | Status: DC | PRN

## 2016-09-09 MED ORDER — CLOPIDOGREL BISULFATE 300 MG PO TABS
ORAL_TABLET | ORAL | Status: AC
  Filled 2016-09-09: qty 2

## 2016-09-09 MED ORDER — IOPAMIDOL (ISOVUE-370) INJECTION 76%
INTRAVENOUS | Status: AC
  Filled 2016-09-09: qty 100

## 2016-09-09 MED ORDER — VERAPAMIL HCL 2.5 MG/ML IV SOLN
INTRAVENOUS | Status: AC
  Filled 2016-09-09: qty 2

## 2016-09-09 MED ORDER — HEPARIN SODIUM (PORCINE) 1000 UNIT/ML IJ SOLN
INTRAMUSCULAR | Status: AC
  Filled 2016-09-09: qty 1

## 2016-09-09 MED ORDER — NITROGLYCERIN 1 MG/10 ML FOR IR/CATH LAB
INTRA_ARTERIAL | Status: AC
  Filled 2016-09-09: qty 10

## 2016-09-09 MED ORDER — ONDANSETRON HCL 4 MG/2ML IJ SOLN: 4.0000 mg | Freq: Four times a day (QID) | INTRAMUSCULAR | Status: DC | PRN

## 2016-09-09 MED ORDER — ASPIRIN EC 81 MG PO TBEC
81.0000 mg | DELAYED_RELEASE_TABLET | Freq: Every day | ORAL | Status: DC
  Administered 2016-09-10: 09:00:00 81 mg via ORAL
  Filled 2016-09-09 (×3): qty 1

## 2016-09-09 MED ORDER — HYDRALAZINE HCL 20 MG/ML IJ SOLN: 5.0000 mg | INTRAMUSCULAR | Status: AC | PRN

## 2016-09-09 MED ORDER — ASPIRIN 81 MG PO CHEW: 81.0000 mg | CHEWABLE_TABLET | ORAL | Status: DC

## 2016-09-09 MED ORDER — LIDOCAINE HCL (PF) 1 % IJ SOLN
INTRAMUSCULAR | Status: DC | PRN
  Administered 2016-09-09: 2 mL

## 2016-09-09 MED ORDER — SODIUM CHLORIDE 0.9 % WEIGHT BASED INFUSION: 3.0000 mL/kg/h | INTRAVENOUS | Status: DC

## 2016-09-09 MED ORDER — NEBIVOLOL HCL 10 MG PO TABS
10.0000 mg | ORAL_TABLET | Freq: Every day | ORAL | Status: DC
  Administered 2016-09-09: 21:00:00 10 mg via ORAL
  Filled 2016-09-09: qty 1

## 2016-09-09 MED ORDER — ATORVASTATIN CALCIUM 80 MG PO TABS
80.0000 mg | ORAL_TABLET | Freq: Every day | ORAL | Status: DC
  Administered 2016-09-09: 18:00:00 80 mg via ORAL
  Filled 2016-09-09: qty 1

## 2016-09-09 MED ORDER — IOPAMIDOL (ISOVUE-370) INJECTION 76%
INTRAVENOUS | Status: DC | PRN
  Administered 2016-09-09: 250 mL via INTRAVENOUS

## 2016-09-09 MED ORDER — PANTOPRAZOLE SODIUM 40 MG PO TBEC
40.0000 mg | DELAYED_RELEASE_TABLET | Freq: Every day | ORAL | Status: DC
  Administered 2016-09-09 – 2016-09-10 (×2): 40 mg via ORAL
  Filled 2016-09-09 (×2): qty 1

## 2016-09-09 MED ORDER — FENTANYL CITRATE (PF) 100 MCG/2ML IJ SOLN
INTRAMUSCULAR | Status: DC | PRN
  Administered 2016-09-09: 25 ug via INTRAVENOUS

## 2016-09-09 MED ORDER — LABETALOL HCL 5 MG/ML IV SOLN: 10.0000 mg | INTRAVENOUS | Status: AC | PRN

## 2016-09-09 MED ORDER — FAMOTIDINE IN NACL 20-0.9 MG/50ML-% IV SOLN
INTRAVENOUS | Status: AC
  Filled 2016-09-09: qty 50

## 2016-09-09 MED ORDER — LISINOPRIL 10 MG PO TABS
10.0000 mg | ORAL_TABLET | Freq: Every day | ORAL | Status: DC
  Administered 2016-09-09 – 2016-09-10 (×2): 10 mg via ORAL
  Filled 2016-09-09 (×2): qty 1

## 2016-09-09 SURGICAL SUPPLY — 28 items
BALLN SAPPHIRE 2.5X12 (BALLOONS) ×4
BALLN SAPPHIRE ~~LOC~~ 3.25X8 (BALLOONS) ×2 IMPLANT
BALLN SAPPHIRE ~~LOC~~ 3.5X8 (BALLOONS) ×2 IMPLANT
BALLN ~~LOC~~ EMERGE MR 3.0X8 (BALLOONS) ×2
BALLN ~~LOC~~ EMERGE MR 4.0X15 (BALLOONS) ×4
BALLOON SAPPHIRE 2.5X12 (BALLOONS) ×2 IMPLANT
BALLOON ~~LOC~~ EMERGE MR 3.0X8 (BALLOONS) ×1 IMPLANT
BALLOON ~~LOC~~ EMERGE MR 4.0X15 (BALLOONS) ×2 IMPLANT
CATH 5FR JL3.5 JR4 ANG PIG MP (CATHETERS) ×2 IMPLANT
CATH GUIDEZILLA II 6F (CATHETERS) ×1 IMPLANT
CATH LAUNCHER 6FR EBU3.5 (CATHETERS) ×2 IMPLANT
CATH LAUNCHER 6FR HS (CATHETERS) ×2 IMPLANT
CATH VISTA GUIDE 6FR AL1 (CATHETERS) ×2 IMPLANT
CATHETER GUIDEZILLA II 6F (CATHETERS) ×2
DEVICE RAD COMP TR BAND LRG (VASCULAR PRODUCTS) ×2 IMPLANT
GLIDESHEATH SLEND SS 6F .021 (SHEATH) ×2 IMPLANT
GUIDEWIRE INQWIRE 1.5J.035X260 (WIRE) ×1 IMPLANT
INQWIRE 1.5J .035X260CM (WIRE) ×2
KIT ENCORE 26 ADVANTAGE (KITS) ×2 IMPLANT
KIT HEART LEFT (KITS) ×2 IMPLANT
PACK CARDIAC CATHETERIZATION (CUSTOM PROCEDURE TRAY) ×2 IMPLANT
STENT SIERRA 2.75 X 12 MM (Permanent Stent) ×2 IMPLANT
STENT SIERRA 3.00 X 12 MM (Permanent Stent) ×2 IMPLANT
STENT SIERRA 3.50 X 23 MM (Permanent Stent) ×2 IMPLANT
TRANSDUCER W/STOPCOCK (MISCELLANEOUS) ×2 IMPLANT
TUBING CIL FLEX 10 FLL-RA (TUBING) ×2 IMPLANT
WIRE ASAHI PROWATER 180CM (WIRE) ×2 IMPLANT
WIRE PT2 MS 185 (WIRE) ×2 IMPLANT

## 2016-09-09 NOTE — H&P (View-Only) (Signed)
Colin Lopez Date of Birth: 09-27-1940 Medical Record #[id number]  History of Present Illness: [name] is seen for  followup CAD. He has a history of coronary disease with a non-ST elevation myocardial infarction in 2009. He underwent stenting of the left circumflex coronary with a 3.5 x 16 mm Liberte stent. He does have a history of hypertension and hyperlipidemia. He is intolerant to Crestor.  He was seen in February with some atypical left shoulder and neck pain. He was tachycardic. A Myoview study was ordered but was never done. He had a CT chest in April which was negative for PE. There was a rounded density in the LLL of unclear etiology. Later PET scan in June showed low metabolic activity and low risk. He was seen by Dr Melvyn Novas. BP was high and he was given Bystolic samples to take. Never filled Rx and when seen by Dr. Silvio Pate given metoprolol. Quit taking this because he thought it caused indigestion. Notes severe symptoms of acid reflux over the past 2 months with burning in his throat and then bad chest pressure like it is going to explode after eating. Some relief with Zantac. Wife recommended vinegar but this has not helped.   He does note more DOE. More fatigued. BP has been persistently high. He did tolerate Bystolic well.   Current Outpatient Prescriptions on File Prior to Visit  Medication Sig Dispense Refill  . aspirin EC 81 MG tablet Take 1 tablet (81 mg total) by mouth daily. 90 tablet 3  . nitroGLYCERIN (NITROSTAT) 0.4 MG SL tablet Place 1 tablet (0.4 mg total) under the tongue every 5 (five) minutes as needed for chest pain. 25 tablet 3  . Omega-3 Fatty Acids (FISH OIL BURP-LESS) 1000 MG CAPS Take 1 capsule by mouth 3 (three) times daily after meals.     No current facility-administered medications on file prior to visit.     Allergies  Allergen Reactions  . Levaquin [Levofloxacin In D5w]     Rash  . Penicillins Hives  . Doxycycline Rash    Water blisters    Past  Medical History:  Diagnosis Date  . Coronary disease    Status post stenting of the left circumflex coronary in 2009 with a bare-metal stent (with a 3.5x52mm Liberte stent)  . Dyslipidemia   . 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 Hyperlipidemia   . Hypertension   . NSTEMI (non-ST elevated myocardial infarction) ([place]) 2009   BMS CFX    Past Surgical History:  Procedure Laterality Date  . CARDIOVASCULAR STRESS TEST  10-03-08   EF 59%  . CORONARY ANGIOPLASTY WITH STENT PLACEMENT    . US ECHOCARDIOGRAPHY  09-21-08   EF 55-60%    History  Smoking Status  . Never Smoker  Smokeless Tobacco  . Never Used    History  Alcohol Use  . Yes    Comment: Rarely    Family History  Problem Relation Age of Onset  . Diabetes Mother   . CAD Sister 18       MI, obese  . Cancer Brother        stomach    Review of Systems: As noted in history of present illness.  All other systems were reviewed and are negative.  Physical Exam: BP (!) 162/98   Pulse (!) 102   Ht 6\' 1"  (1.854 m)   Wt 210 lb (95.3 kg)   SpO2 93%   BMI 27.71 kg/m  He is a WDWM in  [place]. The HEENT exam is normal.  He is hard of hearing. The carotids are 2+ without bruits.  There is no thyromegaly.  There is no JVD.  The lungs are clear.    The heart exam reveals a regular rate with a normal S1 and S2.  There are no murmurs, gallops, or rubs.  The PMI is not displaced.   Abdominal exam reveals good bowel sounds.  .  There are no masses.  Exam of the legs reveal no clubbing, cyanosis, or edema. There is swelling of the right knee patellar bursa. The distal pulses are intact.  Cranial nerves II - XII are intact.    LABORATORY DATA:   Lab Results  Component Value Date   WBC 9.9 04/20/2016   HGB 14.7 04/20/2016   HCT 42.5 04/20/2016   PLT 162.0 04/20/2016   GLUCOSE 120 (H) 12/17/2014   CHOL 283 (H) 12/17/2014   TRIG (H) 12/17/2014    1196.0 Triglyceride is over 400; calculations on Lipids are invalid.   HDL 27.90 (L) 12/17/2014    LDLDIRECT 74.0 12/17/2014   LDLCALC 84 07/12/2013   ALT 14 12/17/2014   AST 13 12/17/2014   NA 136 12/17/2014   K 4.0 12/17/2014   CL 100 12/17/2014   CREATININE 0.90 04/21/2016   BUN 19 12/17/2014   CO2 27 12/17/2014   TSH 2.200 07/12/2013   PSA 0.27 12/17/2014   INR 1.0 03/02/2007   HGBA1C 5.7 12/17/2014    Assessment / Plan: 1. Coronary disease with prior stenting of the left circumflex coronary in 2009 with a bare-metal stent. He has some chest pain and DOE. It does appear that he has GERD but he is at least moderate risk for coronary ischemia.  I have recommended continuing ASA at 81 mg daily. Will schedule for a Lexiscan myoview study since he is unable to exercise on a treadmill. Will start back on Bystolic 10 mg daily. If unable to afford will need to consider alternative antihypertensive therapy. Either Coreg or an ARB would be preferable.  2. Hypertension-poorly controlled. See above. 3. Dyslipidemia. Intolerant to statins. Lipid panel ijn past showed very high triglycerides. Continue fish oil. Will check fasting lab work. May need to consider fenofibrate if triglycerides still very high. 4. 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 GERD. Prescribed Protonix 40 mg daily. Avoid acidic foods and vinegar. If symptoms do not improve will need to consider GI evaluation.

## 2016-09-09 NOTE — Discharge Summary (Signed)
Discharge Summary    Patient ID: Colin Lopez,  MRN: [id number], DOB/AGE: 1940-12-03 76 y.o.  Admit date: 09/09/2016 Discharge date: 09/10/2016  Primary Care Provider: Jearld Fenton Primary Cardiologist: [name]  Discharge Diagnoses    Principal Problem:   Angina pectoris [facility]) Active Problems:   Coronary disease   Dyslipidemia   Essential hypertension   Abnormal nuclear stress test   Allergies Allergies  Allergen Reactions  . Penicillins Hives    Has patient had a PCN reaction causing immediate rash, facial/tongue/throat swelling, SOB or lightheadedness with hypotension: No Has patient had a PCN reaction causing severe rash involving mucus membranes or skin necrosis: Yes Has patient had a PCN reaction that required hospitalization: No Has patient had a PCN reaction occurring within the last 10 years: No If all of the above answers are "NO", then may proceed with Cephalosporin use.   [name] Doxycycline Rash    Water blisters  . Levaquin [Levofloxacin In D5w] Rash    Diagnostic Studies/Procedures    LHC: 09/09/16  Conclusion     Mid LAD to Dist LAD lesion, 30 %stenosed.  Prox Cx lesion, 25 %stenosed.  Mid RCA lesion, 95 %stenosed.  A STENT SIERRA 3.50 X 23 MM drug eluting stent was successfully placed.  Post intervention, there is a 0% residual stenosis.  Mid Cx lesion, 70 %stenosed.  A STENT SIERRA 2.75 X 12 MM drug eluting stent was successfully placed.  Post intervention, there is a 0% residual stenosis.  Ost 1st Mrg lesion, 90 %stenosed.  A STENT SIERRA 3.00 X 12 MM drug eluting stent was successfully placed.  Post intervention, there is a 0% residual stenosis.   1. Severe 2 vessel obstructive CAD    - 90% ostial OM1    - 70% distal LCx/OM2    - 95% mid RCA 2. Successful stenting of the mid RCA with DES 3. Successful stenting of the ostium of OM1 with DES 4. Successful stenting of the distal LCx/OM2  Plan: DAPT for at least one year.  Anticipate DC in am.    _____________   History of Present Illness     Colin Lopez is a 76 yo male with PMH of CAD s/p to Lcx (09), HTN, HL, and GERD who was seen in the office by Dr. [name] on 08/25/16. He has a history of coronary disease with a non-ST elevation myocardial infarction in 2009. He underwent stenting of the left circumflex coronary with a 3.5 x 16 mm Liberte stent. He does have a history of hypertension and hyperlipidemia. He is intolerant to Crestor. He was seen in February with some atypical left shoulder and neck pain. He was tachycardic. A Myoview study was ordered but was never done. He had a CT chest in April which was negative for PE. There was a rounded density in the LLL of unclear etiology. Later PET scan in June showed low metabolic activity and low risk. He was seen by Dr Melvyn Novas. BP was high and he was given Bystolic samples to take. Never filled Rx and when seen by Dr. Silvio Pate given metoprolol. Quit taking this because he thought it caused indigestion. Notes severe symptoms of acid reflux over the past 2 months with burning in his throat and then bad chest pressure like it is going to explode after eating. Some relief with Zantac. Wife recommended vinegar but this has not helped.   He did note more DOE. More fatigued. BP had been persistently high. He did tolerate  Bystolic well. Underwent Lexiscan myoview that showed EF of 37% with partially reversible medium-sized, moderate intensity basal inferior and inferoseptal and mid inferior perfusion defect. Given findings he was set up for outpatient cath.   Hospital Course     He underwent cardiac catheterization with Dr. [name] noted above with PCI/DES x3 to mRCA, OM1, and Lcx/OM2. Plan for DAPT with ASA/Plavix for at least one year. Post cath labs showed Cr 0.96 and Hgb 13.3. No complications noted post cath. Worked with cardiac rehab without complaints. Reported to be intolerant to statins with myalgias in the past. Will start  Crestor 5mg  daily. Also added Lisinopril this admission.   General: Well developed, well nourished, male appearing in no acute distress. Head: Normocephalic, atraumatic.  Neck: Supple without bruits,  No JVD Lungs:  Resp regular and unlabored, CTA. Heart: RRR, S1, S2, no S3, S4, or murmur; no rub. Abdomen: Soft, non-tender, non-distended with normoactive bowel sounds. No hepatomegaly. No rebound/guarding. No obvious abdominal masses. Extremities: No clubbing, cyanosis, no edema. Distal pedal pulses are 2+ bilaterally. L/R. Right radial  cath site stable without bruising or hematoma Neuro: Alert and oriented X 3. Moves all extremities spontaneously. Psych: Normal affect.  [name] was seen by Dr. [name] and determined stable for discharge home. Follow up in the office has been arranged. Medications are listed below.   _____________  Discharge Vitals Blood pressure 136/67, pulse 60, temperature 98.1 F (36.7 C), temperature source Oral, resp. rate 15, height 6\' 1"  (1.854 m), weight 205 lb 0.4 oz (93 kg), SpO2 100 %.  Filed Weights   09/09/16 0904 09/10/16 0418  Weight: 208 lb (94.3 kg) 205 lb 0.4 oz (93 kg)    Labs & Radiologic Studies    CBC  Recent Labs  09/08/16 0908 09/10/16 0406  WBC 6.8 8.2  NEUTROABS 4.7  --   HGB 14.3 13.3  HCT 42.3 40.5  MCV 89 89.6  PLT 140* 374*   Basic Metabolic Panel  Recent Labs  09/10/16 0406  NA 136  K 3.8  CL 106  CO2 22  GLUCOSE 101*  BUN 13  CREATININE 0.96  CALCIUM 8.3*   Liver Function Tests No results for input(s): AST, ALT, ALKPHOS, BILITOT, PROT, ALBUMIN in the last 72 hours. No results for input(s): LIPASE, AMYLASE in the last 72 hours. Cardiac Enzymes No results for input(s): CKTOTAL, CKMB, CKMBINDEX, TROPONINI in the last 72 hours. BNP Invalid input(s): POCBNP D-Dimer No results for input(s): DDIMER in the last 72 hours. Hemoglobin A1C No results for input(s): HGBA1C in the last 72 hours. Fasting Lipid  Panel No results for input(s): CHOL, HDL, LDLCALC, TRIG, CHOLHDL, LDLDIRECT in the last 72 hours. Thyroid Function Tests No results for input(s): TSH, T4TOTAL, T3FREE, THYROIDAB in the last 72 hours.  Invalid input(s): FREET3 _____________  No results found. Disposition   Pt is being discharged home today in good condition.  Follow-up Plans & Appointments    Follow-up Information    [name], Evangelina Delancey M, MD Follow up on 09/17/2016.   Specialty:  Cardiology Why:  at 4:40pm for your follow up appt.  Contact information: [address] [place] [address] [phone]          Discharge Instructions    AMB Referral to Cardiac Rehabilitation - Phase II    Complete by:  As directed    Diagnosis:  Coronary Stents   Call MD for:  redness, tenderness, or signs of infection (pain, swelling, redness, odor or  green/yellow discharge around incision site)    Complete by:  As directed    Diet - low sodium heart healthy    Complete by:  As directed    Discharge instructions    Complete by:  As directed    Radial Site Care Refer to this sheet in the next few weeks. These instructions provide you with information on caring for yourself after your procedure. Your caregiver may also give you more specific instructions. Your treatment has been planned according to current medical practices, but problems sometimes occur. Call your caregiver if you have any problems or questions after your procedure. HOME CARE INSTRUCTIONS You may shower the day after the procedure.Remove the bandage (dressing) and gently wash the site with plain soap and water.Gently pat the site dry.  Do not apply powder or lotion to the site.  Do not submerge the affected site in water for 3 to 5 days.  Inspect the site at least twice daily.  Do not flex or bend the affected arm for 24 hours.  No lifting over 5 pounds (2.3 kg) for 5 days after your procedure.  Do not drive home if you are discharged the same day  of the procedure. Have someone else drive you.  You may drive 24 hours after the procedure unless otherwise instructed by your caregiver.  What to expect: Any bruising will usually fade within 1 to 2 weeks.  Blood that collects in the tissue (hematoma) may be painful to the touch. It should usually decrease in size and tenderness within 1 to 2 weeks.  SEEK IMMEDIATE MEDICAL CARE IF: You have unusual pain at the radial site.  You have redness, warmth, swelling, or pain at the radial site.  You have drainage (other than a small amount of blood on the dressing).  You have chills.  You have a fever or persistent symptoms for more than 72 hours.  You have a fever and your symptoms suddenly get worse.  Your arm becomes pale, cool, tingly, or numb.  You have heavy bleeding from the site. Hold pressure on the site.   PLEASE DO NOT MISS ANY DOSES OF YOUR PLAVIX!!!!! Also keep a log of you blood pressures and bring back to your follow up appt. Please call the office with any questions.   Patients taking blood thinners should generally stay away from medicines like ibuprofen, Advil, Motrin, naproxen, and Aleve due to risk of stomach bleeding. You may take Tylenol as directed or talk to your primary doctor about alternatives.   Increase activity slowly    Complete by:  As directed       Discharge Medications     Medication List    STOP taking these medications   ibuprofen 200 MG tablet Commonly known as:  ADVIL,MOTRIN     TAKE these medications   aspirin EC 81 MG tablet Take 1 tablet (81 mg total) by mouth daily. What changed:  when to take this   clopidogrel 75 MG tablet Commonly known as:  PLAVIX Take 1 tablet (75 mg total) by mouth daily with breakfast.   Krill Oil 1000 MG Caps Take 1,000 mg by mouth at bedtime.   lisinopril 10 MG tablet Commonly known as:  PRINIVIL,ZESTRIL Take 1 tablet (10 mg total) by mouth daily.   nebivolol 10 MG tablet Commonly known as:   BYSTOLIC Take 1 tablet (10 mg total) by mouth daily. What changed:  when to take this   nitroGLYCERIN 0.4 MG SL tablet Commonly  known as:  NITROSTAT Place 1 tablet (0.4 mg total) under the tongue every 5 (five) minutes as needed for chest pain.   OVER THE COUNTER MEDICATION Apply 1 application topically daily as needed (pain). Horse Ligament otc cream   pantoprazole 40 MG tablet Commonly known as:  PROTONIX Take 1 tablet (40 mg total) by mouth daily.   rosuvastatin 5 MG tablet Commonly known as:  CRESTOR Take 1 tablet (5 mg total) by mouth daily.        Aspirin prescribed at discharge?  Yes High Intensity Statin Prescribed? (Lipitor 40-80mg  or Crestor 20-40mg ): No intolerant, started Crestor 5mg  Beta Blocker Prescribed? Yes For EF <40%, was ACEI/ARB Prescribed? Yes ADP Receptor Inhibitor Prescribed? (i.e. Plavix etc.-Includes Medically Managed Patients): Yes For EF <40%, Aldosterone Inhibitor Prescribed? No: needs outpatient echo Was EF assessed during THIS hospitalization? No: Needs outpatient echo Was Cardiac Rehab II ordered? (Included Medically managed Patients): Yes   Outstanding Labs/Studies   FLP/LFTs in 6 weeks if tolerating statin. Outpatient echo. BMET at follow up appt.   Duration of Discharge Encounter   Greater than 30 minutes including physician time.  Signed, Reino Bellis NP-C 09/10/2016, 7:40 AM Patient seen and examined and history reviewed. Agree with above findings and plan. Patient reports intolerance to statins in the past but is unsure which ones. Had myalgias. Will try low dose Crestor 5 mg daily. Add ACEi for LV dysfunction- 37% by Myoview. Plan outpatient Echo. Patient is stable for DC today.   Shalinda Burkholder [name], [place] 09/10/2016 8:27 AM

## 2016-09-09 NOTE — Care Management Note (Signed)
Case Management Note  Patient Details  Name: MAUDE GLOOR MRN: [id number] Date of Birth: 1940/04/15  Subjective/Objective:    From home, s/p coronary stent intervention, will be on plavix.                 Action/Plan: NCM will follow for dc needs.   Expected Discharge Date:                  Expected Discharge Plan:     In-House Referral:     Discharge planning Services  CM Consult  Post Acute Care Choice:    Choice offered to:     DME Arranged:    DME Agency:     HH Arranged:    HH Agency:     Status of Service:  In process, will continue to follow  If discussed at Long Length of Stay Meetings, dates discussed:    Additional Comments:  Zenon Mayo, RN 09/09/2016, 4:44 PM

## 2016-09-09 NOTE — Interval H&P Note (Signed)
History and Physical Interval Note:  09/09/2016 1:47 PM  Colin Lopez  has presented today for surgery, with the diagnosis of abnormal mioview  The various methods of treatment have been discussed with the patient and family. After consideration of risks, benefits and other options for treatment, the patient has consented to  Procedure(s): LEFT HEART CATH AND CORONARY ANGIOGRAPHY (N/A) as a surgical intervention .  The patient's history has been reviewed, patient examined, no change in status, stable for surgery.  I have reviewed the patient's chart and labs.  Questions were answered to the patient's satisfaction.    Cath Lab Visit (complete for each Cath Lab visit)  Clinical Evaluation Leading to the Procedure:   ACS: No.  Non-ACS:    Anginal Classification: CCS III  Anti-ischemic medical therapy: Minimal Therapy (1 class of medications)  Non-Invasive Test Results: Intermediate-risk stress test findings: cardiac mortality 1-3%/year  Prior CABG: No previous CABG       Colin Lopez [facility] 09/09/2016 1:48 PM

## 2016-09-10 ENCOUNTER — Other Ambulatory Visit: Payer: Self-pay | Admitting: Cardiology

## 2016-09-10 ENCOUNTER — Encounter (HOSPITAL_COMMUNITY): Payer: Self-pay | Admitting: Cardiology

## 2016-09-10 DIAGNOSIS — Z8249 Family history of ischemic heart disease and other diseases of the circulatory system: Secondary | ICD-10-CM | POA: Diagnosis not present

## 2016-09-10 DIAGNOSIS — Z888 Allergy status to other drugs, medicaments and biological substances status: Secondary | ICD-10-CM | POA: Diagnosis not present

## 2016-09-10 DIAGNOSIS — I10 Essential (primary) hypertension: Secondary | ICD-10-CM | POA: Diagnosis not present

## 2016-09-10 DIAGNOSIS — I252 Old myocardial infarction: Secondary | ICD-10-CM | POA: Diagnosis not present

## 2016-09-10 DIAGNOSIS — Z955 Presence of coronary angioplasty implant and graft: Secondary | ICD-10-CM | POA: Diagnosis not present

## 2016-09-10 DIAGNOSIS — Z880 Allergy status to penicillin: Secondary | ICD-10-CM | POA: Diagnosis not present

## 2016-09-10 DIAGNOSIS — Z833 Family history of diabetes mellitus: Secondary | ICD-10-CM | POA: Diagnosis not present

## 2016-09-10 DIAGNOSIS — K219 Gastro-esophageal reflux disease without esophagitis: Secondary | ICD-10-CM | POA: Diagnosis not present

## 2016-09-10 DIAGNOSIS — Z800 Family history of malignant neoplasm of digestive organs: Secondary | ICD-10-CM | POA: Diagnosis not present

## 2016-09-10 DIAGNOSIS — I25118 Atherosclerotic heart disease of native coronary artery with other forms of angina pectoris: Secondary | ICD-10-CM

## 2016-09-10 DIAGNOSIS — Z7982 Long term (current) use of aspirin: Secondary | ICD-10-CM | POA: Diagnosis not present

## 2016-09-10 DIAGNOSIS — E781 Pure hyperglyceridemia: Secondary | ICD-10-CM | POA: Diagnosis not present

## 2016-09-10 DIAGNOSIS — I25119 Atherosclerotic heart disease of native coronary artery with unspecified angina pectoris: Secondary | ICD-10-CM | POA: Diagnosis not present

## 2016-09-10 DIAGNOSIS — E785 Hyperlipidemia, unspecified: Secondary | ICD-10-CM | POA: Diagnosis not present

## 2016-09-10 DIAGNOSIS — Z881 Allergy status to other antibiotic agents status: Secondary | ICD-10-CM | POA: Diagnosis not present

## 2016-09-10 DIAGNOSIS — Z79899 Other long term (current) drug therapy: Secondary | ICD-10-CM | POA: Diagnosis not present

## 2016-09-10 LAB — BASIC METABOLIC PANEL
Anion gap: 8 (ref 5–15)
BUN: 13 mg/dL (ref 6–20)
CO2: 22 mmol/L (ref 22–32)
Calcium: 8.3 mg/dL — ABNORMAL LOW (ref 8.9–10.3)
Chloride: 106 mmol/L (ref 101–111)
Creatinine, Ser: 0.96 mg/dL (ref 0.61–1.24)
GFR calc Af Amer: >60 mL/min (ref >60)
GFR calc non Af Amer: >60 mL/min (ref >60)
Glucose, Bld: 101 mg/dL — ABNORMAL HIGH (ref 65–99)
Potassium: 3.8 mmol/L (ref 3.5–5.1)
Sodium: 136 mmol/L (ref 135–145)

## 2016-09-10 LAB — CBC
HCT: 40.5 % (ref 39.0–52.0)
Hemoglobin: 13.3 g/dL (ref 13.0–17.0)
MCH: 29.4 pg (ref 26.0–34.0)
MCHC: 32.8 g/dL (ref 30.0–36.0)
MCV: 89.6 fL (ref 78.0–100.0)
Platelets: 130 10*3/uL — ABNORMAL LOW (ref 150–400)
RBC: 4.52 MIL/uL (ref 4.22–5.81)
RDW: 14.6 % (ref 11.5–15.5)
WBC: 8.2 10*3/uL (ref 4.0–10.5)

## 2016-09-10 MED ORDER — ANGIOPLASTY BOOK
Freq: Once | Status: DC
  Filled 2016-09-10: qty 1

## 2016-09-10 MED ORDER — CLOPIDOGREL BISULFATE 75 MG PO TABS: 75.0000 mg | ORAL_TABLET | Freq: Every day | ORAL | 2 refills | Status: DC

## 2016-09-10 MED ORDER — ROSUVASTATIN CALCIUM 10 MG PO TABS
5.0000 mg | ORAL_TABLET | Freq: Every day | ORAL | Status: DC
  Filled 2016-09-10: qty 1

## 2016-09-10 MED ORDER — ROSUVASTATIN CALCIUM 5 MG PO TABS: 5.0000 mg | ORAL_TABLET | Freq: Every day | ORAL | 2 refills | Status: DC

## 2016-09-10 MED ORDER — LISINOPRIL 10 MG PO TABS: 10.0000 mg | ORAL_TABLET | Freq: Every day | ORAL | 0 refills | Status: DC

## 2016-09-10 NOTE — Care Management Note (Signed)
Case Management Note  Patient Details  Name: Colin Lopez MRN: [id number] Date of Birth: [birth year]  Subjective/Objective:       From home, s/p coronary stent intervention, will be on plavix.  For dc today, no needs.                            Action/Plan:   Expected Discharge Date:  09/10/16               Expected Discharge Plan:  Home/Self Care  In-House Referral:     Discharge planning Services  CM Consult  Post Acute Care Choice:    Choice offered to:     DME Arranged:    DME Agency:     HH Arranged:    HH Agency:     Status of Service:  Completed, signed off  If discussed at [employer] of Stay Meetings, dates discussed:    Additional Comments:  Zenon Mayo, RN 09/10/2016, 9:42 AM

## 2016-09-10 NOTE — Progress Notes (Signed)
CARDIAC REHAB PHASE I   PRE:  Rate/Rhythm:   Up in hall independently  MODE:  Ambulation: 500 ft   POST:  Rate/Rhythm: 73 SR  BP:  Supine:   Sitting: 165/77  Standing:    SaO2:  0800-0845 Pt up in hall walking independently. No CP and tolerated well. Education completed with pt and wife who voiced understanding. Stressed importance of plavix with stent. Discussed NTG use, risk factors, ex ed and heart heathy diet. Referring to [facility] Phase 2.   Graylon Good, RN BSN  09/10/2016 8:52 AM

## 2016-09-13 NOTE — Progress Notes (Signed)
Colin Lopez Date of Birth: [birth year] Medical Record #[id number]  History of Present Illness: [name] is seen for post hospital follow up.  He has a history of coronary disease with a non-ST elevation myocardial infarction in 2009. He underwent stenting of the left circumflex coronary with a 3.5 x 16 mm Liberte stent. He does have a history of hypertension and hyperlipidemia. He is intolerant to Crestor.  He was seen in February with some atypical left shoulder and neck pain. He was tachycardic. A Myoview study was ordered but was never done. He had a CT chest in April which was negative for PE. There was a rounded density in the LLL of unclear etiology. Later PET scan in June showed low metabolic activity and low risk. He was seen by Dr Melvyn Novas. BP was high and he was given Bystolic samples to take. Never filled Rx and when seen by Dr. Silvio Pate given metoprolol. Quit taking this because he thought it caused indigestion. He noted severe symptoms of bad indigestion over 2 months with burning in his throat and then bad chest pressure like it is going to explode after eating. He underwent a Lexiscan myoview study which showed multiple perfusion defects and EF 37%. This led to a cardiac cath that showed 3 vessel disease with successful management with DES x 3. Unable to assess LV function at cath.   On follow up today he is feeling better. Chest pain and pressure have resolved. Notes some tenderness at cath site when he moves thumb. No edema. Tolerating medication well.   Current Outpatient Prescriptions on File Prior to Visit  Medication Sig Dispense Refill  . aspirin EC 81 MG tablet Take 1 tablet (81 mg total) by mouth daily. (Patient taking differently: Take 81 mg by mouth at bedtime. ) 90 tablet 3  . clopidogrel (PLAVIX) 75 MG tablet Take 1 tablet (75 mg total) by mouth daily with breakfast. 90 tablet 2  . Krill Oil 1000 MG CAPS Take 1,000 mg by mouth at bedtime.    [name] lisinopril (PRINIVIL,ZESTRIL) 10  MG tablet Take 1 tablet (10 mg total) by mouth daily. 90 tablet 0  . nebivolol (BYSTOLIC) 10 MG tablet Take 1 tablet (10 mg total) by mouth daily. (Patient taking differently: Take 10 mg by mouth at bedtime. ) 90 tablet 3  . nitroGLYCERIN (NITROSTAT) 0.4 MG SL tablet Place 1 tablet (0.4 mg total) under the tongue every 5 (five) minutes as needed for chest pain. 25 tablet 3  . OVER THE COUNTER MEDICATION Apply 1 application topically daily as needed (pain). Horse Ligament otc cream    . pantoprazole (PROTONIX) 40 MG tablet Take 1 tablet (40 mg total) by mouth daily. 30 tablet 11  . rosuvastatin (CRESTOR) 5 MG tablet Take 1 tablet (5 mg total) by mouth daily. 30 tablet 2   No current facility-administered medications on file prior to visit.     Allergies  Allergen Reactions  . Penicillins Hives    Has patient had a PCN reaction causing immediate rash, facial/tongue/throat swelling, SOB or lightheadedness with hypotension: No Has patient had a PCN reaction causing severe rash involving mucus membranes or skin necrosis: Yes Has patient had a PCN reaction that required hospitalization: No Has patient had a PCN reaction occurring within the last 10 years: No If all of the above answers are "NO", then may proceed with Cephalosporin use.   [name] Doxycycline Rash    Water blisters  . Levaquin [Levofloxacin In D5w] Rash  Past Medical History:  Diagnosis Date  . CAD (coronary artery disease), native coronary artery    09/09/16 PCI/DES x3 to mRCA, OM1 and dLcx/OM2.   . Coronary disease    Status post stenting of the left circumflex coronary in 2009 with a bare-metal stent (with a 3.5x25m Liberte stent)  . Dyslipidemia   . 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 Headache   . Hyperlipidemia   . Hypertension   . NSTEMI (non-ST elevated myocardial infarction) [place]   BMS CFX  . TIA (transient ischemic attack)    history of tia    Past Surgical History:  Procedure Laterality Date  . CARDIOVASCULAR STRESS  TEST  10-03-08   EF 59%  . CORONARY ANGIOPLASTY WITH STENT PLACEMENT  09/09/2016  . CORONARY STENT INTERVENTION N/A 09/09/2016   Procedure: CORONARY STENT INTERVENTION;  Surgeon: JMartinique [name] M, MD;  [facility];  Service: Cardiovascular;  Laterality: N/A;  . LEFT HEART CATH AND CORONARY ANGIOGRAPHY N/A 09/09/2016   Procedure: LEFT HEART CATH AND CORONARY ANGIOGRAPHY;  Surgeon: JMartinique [name] M, MD;  [facility];  Service: Cardiovascular;  Laterality: N/A;  . [place]  09-21-08   EF 55-60%    History  Smoking Status  . Never Smoker  Smokeless Tobacco  . Never Used    History  Alcohol Use  . Yes    Comment: Rarely    Family History  Problem Relation Age of Onset  . Diabetes Mother   . CAD Sister 77      MI, obese 77. Cancer Brother        stomach    Review of Systems: As noted in history of present illness.  All other systems were reviewed and are negative.  Physical Exam: BP 136/78   Pulse (!) 56   Ht _0  (1.854 m)   Wt 209 lb 9.6 oz (95.1 kg)   SpO2 91%   BMI 27.65 kg/m  He is a WDWM in NAD. The HEENT exam is normal.  He is hard of hearing. The carotids are 2+ without bruits.  There is no thyromegaly.  There is no JVD.  The lungs are clear.    The heart exam reveals a regular rate with a normal S1 and S2.  There are no murmurs, gallops, or rubs.  The PMI is not displaced.   Abdominal exam reveals good bowel sounds.  .  There are no masses.  Exam of the legs reveal no clubbing, cyanosis, or edema.  The distal pulses are intact.  Right radial site demonstrates good pulse. No hematoma or swelling. Mild tenderness to palpation. Cranial nerves II - XII are intact.    LABORATORY DATA:   Lab Results  Component Value Date   WBC 8.2 09/10/2016   HGB 13.3 09/10/2016   HCT 40.5 09/10/2016   PLT 130 (L) 09/10/2016   GLUCOSE 101 (H) 09/10/2016   CHOL 202 (H) 09/03/2016   TRIG 367 (H) 09/03/2016   HDL 30 (L) 09/03/2016    LDLDIRECT 74.0 12/17/2014   LDLCALC 99 09/03/2016   ALT 17 09/03/2016   AST 26 09/03/2016   NA 136 09/10/2016   K 3.8 09/10/2016   CL 106 09/10/2016   CREATININE 0.96 09/10/2016   BUN 13 09/10/2016   CO2 22 09/10/2016   TSH 2.200 07/12/2013   PSA 0.27 12/17/2014   INR 0.9 09/08/2016   HGBA1C 5.7 12/17/2014   Myoview 09/03/16: Study Highlights  The left ventricular ejection fraction is moderately decreased (30-44%).  Nuclear stress EF: 37%.  There was no ST segment deviation noted during stress.  This is an intermediate risk study.   1. EF 37%, diffuse hypokinesis.  2. Partially reversible medium-sized, moderate intensity basal inferior and inferoseptal and mid inferior perfusion defect. This suggests prior infarction with significant peri-infarct ischemia.   Intermediate risk study.     Cardiac cath/PCI 09/09/16: Procedures   CORONARY STENT INTERVENTION  LEFT HEART CATH AND CORONARY ANGIOGRAPHY  Conclusion     Mid LAD to Dist LAD lesion, 30 %stenosed.  Prox Cx lesion, 25 %stenosed.  Mid RCA lesion, 95 %stenosed.  A STENT SIERRA 3.50 X 23 MM drug eluting stent was successfully placed.  Post intervention, there is a 0% residual stenosis.  Mid Cx lesion, 70 %stenosed.  A STENT SIERRA 2.75 X 12 MM drug eluting stent was successfully placed.  Post intervention, there is a 0% residual stenosis.  Ost 1st Mrg lesion, 90 %stenosed.  A STENT SIERRA 3.00 X 12 MM drug eluting stent was successfully placed.  Post intervention, there is a 0% residual stenosis.   1. Severe 2 vessel obstructive CAD    - 90% ostial OM1    - 70% distal LCx/OM2    - 95% mid RCA 2. Successful stenting of the mid RCA with DES 3. Successful stenting of the ostium of OM1 with DES 4. Successful stenting of the distal LCx/OM2  Plan: DAPT for at least one year. Anticipate DC in am.    Indications   Angina pectoris (HCC) [I20.9 (ICD-10-CM)]  Procedural Details/Technique    Technical Details Indication: 76 yo WM with remote stent of the LCx presents with angina pectoris and abnormal Myoview study  Procedural Details: The right wrist was prepped, draped, and anesthetized with 1% lidocaine. Using the modified Seldinger technique, a 6 French slender sheath was introduced into the right radial artery. 3 mg of verapamil was administered through the sheath, weight-based unfractionated heparin was administered intravenously. Standard Judkins catheters were used for selective coronary angiography. Due to tortuosity of the innominate artery I was unable to cross the AV with a pigtail catheter. Catheter exchanges were performed over an exchange length guidewire. Following diagnostic angiography we proceeded with multivessel PCI as noted below. There were no immediate procedural complications. A TR band was used for radial hemostasis at the completion of the procedure. The patient was transferred to the post catheterization recovery area for further monitoring.  Contrast: 250 cc   Estimated blood loss <50 mL.  During this procedure the patient was administered the following to achieve and maintain moderate conscious sedation: Versed 1 mg, Fentanyl 25 mcg, while the patient's heart rate, blood pressure, and oxygen saturation were continuously monitored. The period of conscious sedation was 110 minutes, of which I was present face-to-face 100% of this time.    Complications   Complications documented before study signed (09/09/2016 4:05 PM EDT)    No complications were associated with this study.  Documented by [name], [name] M, MD - 09/09/2016 4:01 PM EDT    Coronary Findings   Dominance: Right  Left Anterior Descending  Mid LAD to Dist LAD lesion, 30% stenosed. The lesion is segmental.  Left Circumflex  Prox Cx lesion, 25% stenosed. The lesion was previously treated using a bare metal stent over 2 years ago.  Mid Cx lesion, 70% stenosed.  Angioplasty: Lesion crossed  with guidewire using a WIRE ASAHI PROWATER 180CM. Pre-stent angioplasty was performed using  a BALLOON SAPPHIRE 2.5X12. A STENT SIERRA 2.75 X 12 MM drug eluting stent was successfully placed. Stent strut is well apposed. Post-stent angioplasty was performed using a BALLOON [place] EMERGE MR 3.0X8. Maximum pressure: 16 atm. The pre-interventional distal flow is normal (TIMI 3). The post-interventional distal flow is normal (TIMI 3). The intervention was successful . No complications occurred at this lesion.  There is no residual stenosis post intervention.  First Obtuse Marginal Branch  Vessel is large in size.  Ost 1st Mrg lesion, 90% stenosed.  Angioplasty: Lesion crossed with guidewire using a BALLOON SAPPHIRE 2.5X12. A STENT SIERRA 3.00 X 12 MM drug eluting stent was successfully placed. Stent strut is well apposed. Post-stent angioplasty was performed using a BALLOON SAPPHIRE [place] ARB 3.5X8. Maximum pressure: 16 atm. The pre-interventional distal flow is normal (TIMI 3). The post-interventional distal flow is normal (TIMI 3). The intervention was successful . No complications occurred at this lesion. The OM 1 lesion was at the ostium. It had previously been stented across with BMS. The lesion was wired through the stent struts. The lesion was stented at the ostium ( T stent ). After it was postdilated there was some plaque shift into the LCx distal to the lesion but within the old stent. This was dilated with a 3.25 mm balloon with good result.  There is no residual stenosis post intervention.  Right Coronary Artery  Vessel is large.  Mid RCA lesion, 95% stenosed.  Angioplasty: Lesion crossed with guidewire using a WIRE ASAHI PROWATER 180CM. Pre-stent angioplasty was performed using a BALLOON SAPPHIRE 2.5X12. Maximum pressure: 8 atm. A STENT SIERRA 3.50 X 23 MM drug eluting stent was successfully placed. Stent strut is well apposed. Post-stent angioplasty was performed using a BALLOON [place] EMERGE MR 4.0X15. Maximum  pressure: 16 atm. The pre-interventional distal flow is normal (TIMI 3). The post-interventional distal flow is normal (TIMI 3). The intervention was successful . No complications occurred at this lesion. Unable to engage the RCA with a LA guide. HS guide engaged well but was inadequate to support delivery of stent to lesion. Holly Bodily used for additional support with good results.  There is no residual stenosis post intervention.  Coronary Diagrams   Diagnostic Diagram       Post-Intervention Diagram          Echo 09/16/16: Study Conclusions  - Left ventricle: The cavity size was normal. Wall thickness was   increased in a pattern of severe LVH. Systolic function was   normal. The estimated ejection fraction was in the range of 55%   to 60%. Wall motion was normal; there were no regional wall   motion abnormalities. Doppler parameters are consistent with   abnormal left ventricular relaxation (grade 1 diastolic   dysfunction). - Aortic valve: Trileaflet; moderately calcified leaflets.   Sclerosis without stenosis. - Aorta: Mildly dilated aortic root. Aortic root dimension: 38 mm   (ED). - Mitral valve: Mildly calcified annulus. There was no significant   regurgitation. - Right ventricle: The cavity size was normal. Systolic function   was normal. - Tricuspid valve: Peak RV-RA gradient (S): 22 mm Hg. - Pulmonary arteries: PA peak pressure: 25 mm Hg (S). - Inferior vena cava: The vessel was normal in size. The   respirophasic diameter changes were in the normal range (= 50%),   consistent with normal central venous pressure.  Impressions:  - Normal LV size with severe LV hypertrophy. EF 55-60%. Normal RV   size and systolic function. Aortic valve  sclerosis without   significant stenosis.  Assessment / Plan: 1. Coronary disease with prior stenting of the left circumflex coronary in 2009 with a bare-metal stent. Now with recent stenting of the first OM, distal LCx and mid  RCA with DES.   Continue DAPT with ASA and Plavix for at least one year possibly indefinitely.  Bystolic 10 mg daily.  2. 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 No CHF. -Recently started on Bystolic and lisinopril for elevated BP and LV dysfunction. BP is improved and controlled.  3. Dyslipidemia. Intolerant to statins. Lipid panel ijn past showed very high triglycerides. Continue fish oil. Started on low dose Crestor 5 mg daily. Will need repeat lab work in 3 months. 4. 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 GERD. Prescribed Protonix 40 mg daily.  6. LV function noted on Myoview with EF 37%. By Echo done yesterday post revascularization EF now normal. Continue Bystolic and lisinopril.

## 2016-09-16 ENCOUNTER — Ambulatory Visit (HOSPITAL_COMMUNITY): Payer: Medicare Other | Attending: Cardiology

## 2016-09-16 ENCOUNTER — Other Ambulatory Visit: Payer: Self-pay

## 2016-09-16 DIAGNOSIS — Z8249 Family history of ischemic heart disease and other diseases of the circulatory system: Secondary | ICD-10-CM | POA: Insufficient documentation

## 2016-09-16 DIAGNOSIS — E785 Hyperlipidemia, unspecified: Secondary | ICD-10-CM | POA: Diagnosis not present

## 2016-09-16 DIAGNOSIS — I25118 Atherosclerotic heart disease of native coronary artery with other forms of angina pectoris: Secondary | ICD-10-CM

## 2016-09-16 DIAGNOSIS — I10 Essential (primary) hypertension: Secondary | ICD-10-CM | POA: Insufficient documentation

## 2016-09-17 ENCOUNTER — Ambulatory Visit (INDEPENDENT_AMBULATORY_CARE_PROVIDER_SITE_OTHER): Payer: Medicare Other | Admitting: Cardiology

## 2016-09-17 ENCOUNTER — Encounter: Payer: Self-pay | Admitting: Cardiology

## 2016-09-17 VITALS — BP 136/78 | HR 56 | Ht 73.0 in | Wt 209.6 lb

## 2016-09-17 DIAGNOSIS — I2510 Atherosclerotic heart disease of native coronary artery without angina pectoris: Secondary | ICD-10-CM | POA: Diagnosis not present

## 2016-09-17 DIAGNOSIS — E785 Hyperlipidemia, unspecified: Secondary | ICD-10-CM | POA: Diagnosis not present

## 2016-09-17 DIAGNOSIS — I119 Hypertensive heart disease without heart failure: Secondary | ICD-10-CM | POA: Diagnosis not present

## 2016-09-17 NOTE — Patient Instructions (Signed)
Continue your current therapy   I will see you in 3 months. 

## 2016-09-24 ENCOUNTER — Ambulatory Visit: Payer: Self-pay | Admitting: Physician Assistant

## 2016-09-30 ENCOUNTER — Ambulatory Visit (INDEPENDENT_AMBULATORY_CARE_PROVIDER_SITE_OTHER)
Admission: RE | Admit: 2016-09-30 | Discharge: 2016-09-30 | Disposition: A | Discharge status: Home / Self Care | Payer: Medicare Other | Source: Ambulatory Visit | Attending: Internal Medicine | Admitting: Internal Medicine

## 2016-09-30 ENCOUNTER — Ambulatory Visit (INDEPENDENT_AMBULATORY_CARE_PROVIDER_SITE_OTHER): Payer: Medicare Other | Admitting: Internal Medicine

## 2016-09-30 ENCOUNTER — Encounter: Payer: Self-pay | Admitting: Internal Medicine

## 2016-09-30 VITALS — BP 124/70 | HR 60 | Ht 73.0 in | Wt 209.0 lb

## 2016-09-30 DIAGNOSIS — Z8701 Personal history of pneumonia (recurrent): Secondary | ICD-10-CM

## 2016-09-30 DIAGNOSIS — R911 Solitary pulmonary nodule: Secondary | ICD-10-CM | POA: Diagnosis not present

## 2016-09-30 NOTE — Progress Notes (Signed)
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 Pulmonary Consult    Referred by Dr. Danise Mina for eval of lung mass. Pt states had PNA recently and today his breathing is at baseline for him. He states before his PNA he painted a car and he wore a mask, but did not realize there was a hole in it. 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   onset of symptoms was abrupt one day p painting car with new acetone based paint then w/in 24h L posterolateral pleuritic cp/nasal congestion with epistaxis and hemoptysis >  eval 04/20/16 with cxr /CT? 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 Follow-up    CXR repeated today. He states "my breathing is fine". No new co's today. He is down 2 lbs since last ov 05/21/16.   ha is worse in pm's variable seems worse in pm  L cp has not recurred/ no sob or cough rec bystolic 10 mg daily until you see your primary care doctor w/in next couple of weeks Please  see patient coordinator before you leave today  to schedule PET scan - neg      09/30/2016  f/u ov/Valleri Hendricksen re: LLL SPN/ neg pet  Chief Complaint  Patient presents with  . Follow-up    Breathing is doing well and no new co's today.   Not limited by breathing from desired activities    No obvious day to day or daytime variability or assoc excess/ purulent sputum or mucus plugs or hemoptysis or cp or chest tightness, subjective wheeze or overt sinus or hb symptoms. No unusual exp hx or h/o childhood pna/ asthma or knowledge of premature birth.  Sleeping ok flat without nocturnal  or early am exacerbation  of respiratory  c/o's or need for noct saba. Also denies any obvious fluctuation of symptoms with weather or environmental changes or other aggravating or alleviating factors except as outlined above   Current Allergies, Complete Past Medical History, Past Surgical History, Family History, and Social History were reviewed in [employer] record.  ROS  The following are not active complaints unless bolded sore throat, dysphagia, dental problems, itching, sneezing,  nasal congestion or disharge of excess mucus or purulent secretions, ear ache,  fever, chills, sweats, unintended wt loss or wt gain, classically pleuritic or exertional cp,  orthopnea pnd or leg swelling, presyncope, palpitations, abdominal pain, anorexia, nausea, vomiting, diarrhea  or change in bowel habits or bladder habits, change in stools or change in urine, dysuria, hematuria,  rash, arthralgias, visual complaints, headache, numbness, weakness or ataxia or problems with walking or coordination,  change in mood/affect or memory.        Current Meds  Medication Sig  . aspirin EC 81 MG tablet Take 1 tablet (81 mg total) by mouth daily. (Patient taking differently: Take 81 mg by mouth at bedtime. )  . clopidogrel (PLAVIX) 75 MG tablet Take 1 tablet (75 mg total) by mouth daily with breakfast.  . Krill Oil 1000  MG CAPS Take 1,000 mg by mouth at bedtime.  [name] lisinopril (PRINIVIL,ZESTRIL) 10 MG tablet Take 1 tablet (10 mg total) by mouth daily.  . nebivolol (BYSTOLIC) 10 MG tablet Take 1 tablet (10 mg total) by mouth daily. (Patient taking differently: Take 10 mg by mouth at bedtime. )  . nitroGLYCERIN (NITROSTAT) 0.4 MG SL tablet Place 1 tablet (0.4 mg total) under the tongue every 5 (five) minutes as needed for chest pain.  [name] OVER THE COUNTER MEDICATION Apply 1 application topically daily as needed (pain). Horse Ligament otc cream  . pantoprazole (PROTONIX) 40 MG tablet Take 1 tablet (40 mg total) by mouth daily.  . rosuvastatin (CRESTOR) 5 MG tablet Take 1 tablet (5 mg total) by mouth daily.                       Objective:   Physical Exam  amb pleasant wm nad   06/19/2016          209  05/21/16 211 lb (95.7 kg)  05/05/16 214 lb (97.1 kg)  04/20/16 212 lb 4 oz (96.3 kg)    Vital signs reviewed   -  - Note on arrival 02 sats  94% on RA      HEENT: nl  turbinates bilaterally, and oropharynx. Nl external ear canals without cough reflex - edentulous   NECK :  without JVD/Nodes/TM/ nl carotid upstrokes bilaterally   LUNGS: no acc muscle use,  Nl contour chest which is clear to A and P bilaterally without cough on insp or exp maneuvers   CV:  RRR  no s3 or murmur or increase in P2, and no edema   ABD:  soft and nontender with nl inspiratory excursion in the supine position. No bruits or organomegaly appreciated, bowel sounds nl  MS:  Nl gait/ ext warm without deformities, calf tenderness, cyanosis or clubbing No obvious joint restrictions   SKIN: warm and dry  No rash   NEURO:  alert, approp, nl sensorium with  no motor or cerebellar deficits apparent.      CXR PA and Lateral:   09/30/2016 :    I personally reviewed images and   impression as follows:   spn LLL  only seen on PA view with rib superimposed making it look denser    Assessment:

## 2016-09-30 NOTE — Patient Instructions (Addendum)
Please schedule a follow up visit in 3 months but call sooner if needed with cxr on return  

## 2016-10-01 ENCOUNTER — Encounter: Payer: Self-pay | Admitting: Internal Medicine

## 2016-10-01 NOTE — Assessment & Plan Note (Addendum)
CTa Chest  04/21/16 1. No evidence of acute pulmonary embolus. 2. Superior segment left lower lobe rounded 2.4 cm opacity is nonspecific and corresponds to the radiographic finding yesterday. This could represent a round pneumonia, and there is a small associated left pleural effusion.  - cxr 06/19/2016 more dense, localized > rec PET 06/30/2016 >>> althhough there is a persistent subpleural nodule in the left lower lobe, this demonstrates only low level hypermetabolic activity, predominately along the pleural surface. A benign etiology such as rounded atelectasis is favored, although low grade neoplasm cannot be completely excluded > rec ov with cxr in 3 months  - cxr 09/30/2016 c/w regression - only seen on PA view with rib superimposed making it look denser  Given then neg pet and clinical hx there is little evidence of a neoplasm here so rec f/u cxr in 3 months unless new / recurrent symptoms in this location (which were striking at onset but resolved with rx)  Discussed in detail all the  indications, usual  risks and alternatives  relative to the benefits with patient(with wife present)  who agrees to proceed with conservative f/u as outlined    I had an extended discussion with the patient/ wife reviewing all relevant studies completed to date and  lasting 15 to 20 minutes of a 25 minute visit    Each maintenance medication was reviewed in detail including most importantly the difference between maintenance and prns and under what circumstances the prns are to be triggered using an action plan format that is not reflected in the computer generated alphabetically organized AVS.    Please see AVS for specific instructions unique to this visit that I personally wrote and verbalized to the the pt in detail and then reviewed with pt  by my nurse highlighting any  changes in therapy recommended at today's visit to their plan of care.

## 2016-10-11 ENCOUNTER — Emergency Department (HOSPITAL_COMMUNITY): Payer: Medicare Other

## 2016-10-11 ENCOUNTER — Observation Stay (HOSPITAL_COMMUNITY)
Admission: EM | Admit: 2016-10-11 | Discharge: 2016-10-12 | Disposition: A | Discharge status: Home / Self Care | Payer: Medicare Other | Attending: Internal Medicine | Admitting: Internal Medicine

## 2016-10-11 ENCOUNTER — Encounter (HOSPITAL_COMMUNITY): Payer: Self-pay | Admitting: Emergency Medicine

## 2016-10-11 DIAGNOSIS — I252 Old myocardial infarction: Secondary | ICD-10-CM | POA: Diagnosis not present

## 2016-10-11 DIAGNOSIS — Z201 Contact with and (suspected) exposure to tuberculosis: Secondary | ICD-10-CM | POA: Diagnosis not present

## 2016-10-11 DIAGNOSIS — Z7982 Long term (current) use of aspirin: Secondary | ICD-10-CM | POA: Insufficient documentation

## 2016-10-11 DIAGNOSIS — E785 Hyperlipidemia, unspecified: Secondary | ICD-10-CM | POA: Diagnosis present

## 2016-10-11 DIAGNOSIS — E781 Pure hyperglyceridemia: Secondary | ICD-10-CM | POA: Insufficient documentation

## 2016-10-11 DIAGNOSIS — I2510 Atherosclerotic heart disease of native coronary artery without angina pectoris: Secondary | ICD-10-CM | POA: Diagnosis not present

## 2016-10-11 DIAGNOSIS — Z8673 Personal history of transient ischemic attack (TIA), and cerebral infarction without residual deficits: Secondary | ICD-10-CM | POA: Insufficient documentation

## 2016-10-11 DIAGNOSIS — I700 Atherosclerosis of aorta: Secondary | ICD-10-CM | POA: Insufficient documentation

## 2016-10-11 DIAGNOSIS — R270 Ataxia, unspecified: Secondary | ICD-10-CM | POA: Insufficient documentation

## 2016-10-11 DIAGNOSIS — R2681 Unsteadiness on feet: Secondary | ICD-10-CM | POA: Diagnosis not present

## 2016-10-11 DIAGNOSIS — D696 Thrombocytopenia, unspecified: Secondary | ICD-10-CM | POA: Insufficient documentation

## 2016-10-11 DIAGNOSIS — R001 Bradycardia, unspecified: Secondary | ICD-10-CM | POA: Insufficient documentation

## 2016-10-11 DIAGNOSIS — I10 Essential (primary) hypertension: Secondary | ICD-10-CM | POA: Diagnosis not present

## 2016-10-11 DIAGNOSIS — E871 Hypo-osmolality and hyponatremia: Secondary | ICD-10-CM | POA: Insufficient documentation

## 2016-10-11 DIAGNOSIS — R55 Syncope and collapse: Secondary | ICD-10-CM | POA: Insufficient documentation

## 2016-10-11 DIAGNOSIS — Z833 Family history of diabetes mellitus: Secondary | ICD-10-CM | POA: Insufficient documentation

## 2016-10-11 DIAGNOSIS — Z880 Allergy status to penicillin: Secondary | ICD-10-CM | POA: Diagnosis not present

## 2016-10-11 DIAGNOSIS — Z7902 Long term (current) use of antithrombotics/antiplatelets: Secondary | ICD-10-CM | POA: Insufficient documentation

## 2016-10-11 DIAGNOSIS — Z955 Presence of coronary angioplasty implant and graft: Secondary | ICD-10-CM | POA: Diagnosis not present

## 2016-10-11 DIAGNOSIS — E042 Nontoxic multinodular goiter: Secondary | ICD-10-CM | POA: Insufficient documentation

## 2016-10-11 DIAGNOSIS — Z881 Allergy status to other antibiotic agents status: Secondary | ICD-10-CM | POA: Diagnosis not present

## 2016-10-11 DIAGNOSIS — Z800 Family history of malignant neoplasm of digestive organs: Secondary | ICD-10-CM | POA: Insufficient documentation

## 2016-10-11 DIAGNOSIS — Z79899 Other long term (current) drug therapy: Secondary | ICD-10-CM | POA: Diagnosis not present

## 2016-10-11 DIAGNOSIS — I639 Cerebral infarction, unspecified: Principal | ICD-10-CM | POA: Diagnosis present

## 2016-10-11 DIAGNOSIS — I6523 Occlusion and stenosis of bilateral carotid arteries: Secondary | ICD-10-CM | POA: Insufficient documentation

## 2016-10-11 DIAGNOSIS — E663 Overweight: Secondary | ICD-10-CM | POA: Insufficient documentation

## 2016-10-11 DIAGNOSIS — R42 Dizziness and giddiness: Secondary | ICD-10-CM | POA: Diagnosis not present

## 2016-10-11 DIAGNOSIS — R51 Headache: Secondary | ICD-10-CM | POA: Diagnosis not present

## 2016-10-11 DIAGNOSIS — Z8249 Family history of ischemic heart disease and other diseases of the circulatory system: Secondary | ICD-10-CM | POA: Insufficient documentation

## 2016-10-11 DIAGNOSIS — D649 Anemia, unspecified: Secondary | ICD-10-CM | POA: Insufficient documentation

## 2016-10-11 DIAGNOSIS — Z6827 Body mass index (BMI) 27.0-27.9, adult: Secondary | ICD-10-CM | POA: Insufficient documentation

## 2016-10-11 DIAGNOSIS — Z9889 Other specified postprocedural states: Secondary | ICD-10-CM | POA: Diagnosis not present

## 2016-10-11 LAB — CBC
HCT: 38.1 % — ABNORMAL LOW (ref 39.0–52.0)
Hemoglobin: 13.2 g/dL (ref 13.0–17.0)
MCH: 30.8 pg (ref 26.0–34.0)
MCHC: 34.6 g/dL (ref 30.0–36.0)
MCV: 88.8 fL (ref 78.0–100.0)
Platelets: 127 10*3/uL — ABNORMAL LOW (ref 150–400)
RBC: 4.29 MIL/uL (ref 4.22–5.81)
RDW: 14.2 % (ref 11.5–15.5)
WBC: 6.5 10*3/uL (ref 4.0–10.5)

## 2016-10-11 LAB — HEPATIC FUNCTION PANEL
ALT: 16 U/L — ABNORMAL LOW (ref 17–63)
AST: 23 U/L (ref 15–41)
Albumin: 3.8 g/dL (ref 3.5–5.0)
Alkaline Phosphatase: 72 U/L (ref 38–126)
Bilirubin, Direct: 0.1 mg/dL (ref 0.1–0.5)
Indirect Bilirubin: 0.7 mg/dL (ref 0.3–0.9)
Total Bilirubin: 0.8 mg/dL (ref 0.3–1.2)
Total Protein: 6.5 g/dL (ref 6.5–8.1)

## 2016-10-11 LAB — BASIC METABOLIC PANEL
Anion gap: 7 (ref 5–15)
BUN: 16 mg/dL (ref 6–20)
CO2: 23 mmol/L (ref 22–32)
Calcium: 8.7 mg/dL — ABNORMAL LOW (ref 8.9–10.3)
Chloride: 103 mmol/L (ref 101–111)
Creatinine, Ser: 0.9 mg/dL (ref 0.61–1.24)
GFR calc Af Amer: >60 mL/min (ref >60)
GFR calc non Af Amer: >60 mL/min (ref >60)
Glucose, Bld: 141 mg/dL — ABNORMAL HIGH (ref 65–99)
Potassium: 4.3 mmol/L (ref 3.5–5.1)
Sodium: 133 mmol/L — ABNORMAL LOW (ref 135–145)

## 2016-10-11 LAB — URINALYSIS, ROUTINE W REFLEX MICROSCOPIC
Bilirubin Urine: NEGATIVE
Glucose, UA: NEGATIVE mg/dL
Hgb urine dipstick: NEGATIVE
Ketones, ur: NEGATIVE mg/dL
Leukocytes, UA: NEGATIVE
Nitrite: NEGATIVE
Protein, ur: NEGATIVE mg/dL
Specific Gravity, Urine: 1.014 (ref 1.005–1.030)
pH: 5 (ref 5.0–8.0)

## 2016-10-11 LAB — MAGNESIUM: Magnesium: 2.3 mg/dL (ref 1.7–2.4)

## 2016-10-11 LAB — CBG MONITORING, ED: Glucose-Capillary: 142 mg/dL — ABNORMAL HIGH (ref 65–99)

## 2016-10-11 LAB — TROPONIN I: Troponin I: <0.03 ng/mL (ref <0.03)

## 2016-10-11 MED ORDER — SODIUM CHLORIDE 0.9 % IV BOLUS (SEPSIS)
1000.0000 mL | Freq: Once | INTRAVENOUS | Status: AC
  Administered 2016-10-11: 1000 mL via INTRAVENOUS

## 2016-10-11 MED ORDER — MECLIZINE HCL 25 MG PO TABS
25.0000 mg | ORAL_TABLET | Freq: Once | ORAL | Status: AC
Start: 2016-10-11 — End: 2016-10-11
  Administered 2016-10-11: 25 mg via ORAL
  Filled 2016-10-11: qty 1

## 2016-10-11 NOTE — ED Notes (Signed)
Pt presents with weakness that started this date at approximately 1800 hrs. Pt states he also has a migraine that started the same time.

## 2016-10-11 NOTE — ED Provider Notes (Signed)
[facility] DEPT Provider Note   CSN: [id number] Arrival date & time: 10/11/16  1931     History   Chief Complaint Chief Complaint  Patient presents with  . Weakness    HPI Colin Lopez is a 76 y.o. male.  HPI   Sat down to eat supper and suddenly felt dizzy, felt like going to pass out, about 1 hour prior to coming.   Felt dizzy, blurred vision, nausea, both eyes with blurred vision.  No double vision, no visual field deficits.  Felt like on rollercoaster, things moving, Lasted about an hour.  Difficulty walking with dizziness.  No numbness/weakness on one side or the other, no facial droop, no difficulty talking.  Symptoms improved when sitting down.    No chest pain or shortness of breath.  No fevers, no congestion, cough or ear pain.  Headache, frontal, 5/10  Takes blood pressure medicine twice a day since leaving hospital 4 weeks ago   Past Medical History:  Diagnosis Date  . CAD (coronary artery disease), native coronary artery    09/09/16 PCI/DES x3 to mRCA, OM1 and dLcx/OM2.   . Coronary disease    Status post stenting of the left circumflex coronary in 2009 with a bare-metal stent (with a 3.5x45mm Liberte stent)  . Dyslipidemia   . 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 Headache   . Hyperlipidemia   . Hypertension   . NSTEMI (non-ST elevated myocardial infarction) ([place]) 2009   BMS CFX  . TIA (transient ischemic attack)    history of tia    Patient Active Problem List   Diagnosis Date Noted  . Angina pectoris ([place]) 09/09/2016  . Abnormal nuclear stress test 09/09/2016  . Right hand pain 07/24/2016  . Ulnar fracture 07/24/2016  . Pulmonary nodule, left 05/20/2016  . Axillary lymphadenopathy 05/05/2016  . 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 Hemoptysis 04/20/2016  . Insomnia 12/17/2014  . Neuropathic pain of right foot 12/17/2014  . Coronary disease   . Dyslipidemia   . Essential hypertension          Past Surgical  History:  Procedure Laterality Date  . CARDIOVASCULAR STRESS TEST  10-03-08   EF 59%  . CORONARY ANGIOPLASTY WITH STENT PLACEMENT  09/09/2016  . CORONARY STENT INTERVENTION N/A 09/09/2016   Procedure: CORONARY STENT INTERVENTION;  Surgeon: [name], [name] M, MD;  Location: [facility];  Service: Cardiovascular;  Laterality: N/A;  . LEFT HEART CATH AND CORONARY ANGIOGRAPHY N/A 09/09/2016   Procedure: LEFT HEART CATH AND CORONARY ANGIOGRAPHY;  Surgeon: [name], [name] M, MD;  Location: [facility];  Service: Cardiovascular;  Laterality: N/A;  . US ECHOCARDIOGRAPHY  09-21-08   EF 55-60%       Home Medications    Prior to Admission medications   Medication Sig Start Date End Date Taking? Authorizing Provider  acetaminophen (TYLENOL) 500 MG tablet Take 1,000 mg by mouth every 6 (six) hours as needed for headache (pain).   Yes [provider]  aspirin EC 81 MG tablet Take 1 tablet (81 mg total) by mouth daily. 11/06/13  Yes [name], [name] M, MD  clopidogrel (PLAVIX) 75 MG tablet Take 1 tablet (75 mg total) by mouth daily with breakfast. 09/10/16  Yes Cheryln Manly, NP  Krill Oil 1000 MG CAPS Take 1,000 mg by mouth at bedtime.   Yes [provider]  lisinopril (PRINIVIL,ZESTRIL) 10 MG tablet Take 1 tablet (10 mg  total) by mouth daily. 09/10/16  Yes Reino Bellis B, NP  nebivolol (BYSTOLIC) 10 MG tablet Take 1 tablet (10 mg total) by mouth daily. Patient taking differently: Take 10 mg by mouth at bedtime.  08/26/16  Yes [name], [name] M, MD  nitroGLYCERIN (NITROSTAT) 0.4 MG SL tablet Place 1 tablet (0.4 mg total) under the tongue every 5 (five) minutes as needed for chest pain. 02/27/16  Yes Barrett, Evelene Croon, PA-C  OVER THE COUNTER MEDICATION Apply 1 application topically daily as needed (pain). Horse Liniment otc cream   Yes [provider]  pantoprazole (PROTONIX) 40 MG tablet Take 1 tablet (40 mg total) by mouth daily. 08/25/16  Yes [name], [name] M, MD    rosuvastatin (CRESTOR) 5 MG tablet Take 1 tablet (5 mg total) by mouth daily. Patient taking differently: Take 5 mg by mouth at bedtime.  09/10/16  Yes Cheryln Manly, NP    Family History Family History  Problem Relation Age of Onset  . Diabetes Mother   . CAD Sister 59       MI, obese  . Cancer Brother        stomach    Social History Social History  Substance Use Topics  . Smoking status: Never Smoker  . Smokeless tobacco: Never Used  . Alcohol use Yes     Comment: Rarely     Allergies   Penicillins; Doxycycline; and Levaquin [levofloxacin in d5w]   Review of Systems Review of Systems  Constitutional: Negative for fever.  HENT: Negative for sore throat.   Eyes: Positive for visual disturbance.  Respiratory: Negative for shortness of breath.   Cardiovascular: Negative for chest pain and palpitations.  Gastrointestinal: Positive for nausea. Negative for abdominal pain and vomiting.  Genitourinary: Negative for difficulty urinating.  Musculoskeletal: Negative for back pain and neck stiffness.  Skin: Negative for rash.  Neurological: Positive for dizziness, light-headedness and headaches. Negative for syncope, facial asymmetry, speech difficulty, weakness and numbness.     Physical Exam Updated Vital Signs BP (!) 124/99   Pulse (!) 34   Temp 97.8 F (36.6 C) (Oral)   Resp 17   Ht 6\' 1"  (1.854 m)   Wt 94.8 kg (209 lb)   SpO2 93%   BMI 27.57 kg/m   Physical Exam  Constitutional: He is oriented to person, place, and time. He appears well-developed and well-nourished. No distress.  HENT:  Head: Normocephalic and atraumatic.  Eyes: Pupils are equal, round, and reactive to light. Conjunctivae and EOM are normal.  Neck: Normal range of motion.  Cardiovascular: Regular rhythm, normal heart sounds and intact distal pulses.  Bradycardia present.  Exam reveals no gallop and no friction rub.   No murmur heard. Pulmonary/Chest: Effort normal and breath sounds  normal. No respiratory distress. He has no wheezes. He has no rales.  Abdominal: Soft. He exhibits no distension. There is no tenderness. There is no guarding.  Musculoskeletal: He exhibits no edema.  Neurological: He is alert and oriented to person, place, and time. He has normal strength. No cranial nerve deficit or sensory deficit. Coordination and gait normal. GCS eye subscore is 4. GCS verbal subscore is 5. GCS motor subscore is 6.  Skin: Skin is warm and dry. He is not diaphoretic.  Nursing note and vitals reviewed.    ED Treatments / Results  Labs (all labs ordered are listed, but only abnormal results are displayed) Labs Reviewed  BASIC METABOLIC PANEL - Abnormal; Notable for the following:  Result Value   Sodium 133 (*)    Glucose, Bld 141 (*)    Calcium 8.7 (*)    All other components within normal limits  CBC - Abnormal; Notable for the following:    HCT 38.1 (*)    Platelets 127 (*)    All other components within normal limits  HEPATIC FUNCTION PANEL - Abnormal; Notable for the following:    ALT 16 (*)    All other components within normal limits  CBG MONITORING, ED - Abnormal; Notable for the following:    Glucose-Capillary 142 (*)    All other components within normal limits  URINALYSIS, ROUTINE W REFLEX MICROSCOPIC  MAGNESIUM  TROPONIN I    EKG  EKG Interpretation  Date/Time:  Sunday October 11 2016 19:35:55 EDT Ventricular Rate:  54 PR Interval:  176 QRS Duration: 108 QT Interval:  450 QTC Calculation: 426 R Axis:   5 Text Interpretation:  Sinus bradycardia Minimal voltage criteria for LVH, may be normal variant Borderline ECG Nonspecific changes inferior leads in comparison to prior, no other significant changes Confirmed by Gareth Morgan [phone]) on 10/11/2016 8:27:12 PM       Radiology Dg Chest 2 View  Result Date: 10/11/2016 CLINICAL DATA:  76 y/o M; recent cardiac stent placement. Presenting with dizziness, blurred vision, presyncope.  EXAM: CHEST  2 VIEW COMPARISON:  09/30/2016 chest radiograph.  06/30/2016 PET-CT. FINDINGS: Normal cardiac silhouette. Aortic atherosclerosis with calcification. Stable nodule within the left lower lobe. No consolidation. No pleural effusion or pneumothorax. Multilevel degenerative changes of the thoracic spine. IMPRESSION: No acute pulmonary process identified. Electronically Signed   By: Kristine Garbe M.D.   On: 10/11/2016 23:18   Ct Head Wo Contrast  Result Date: 10/11/2016 CLINICAL DATA:  Headache.  Chronic neuro deficit. EXAM: CT HEAD WITHOUT CONTRAST TECHNIQUE: Contiguous axial images were obtained from the base of the skull through the vertex without intravenous contrast. COMPARISON:  None. FINDINGS: Brain: No evidence of acute infarction, hemorrhage, hydrocephalus, extra-axial collection or mass lesion/mass effect. Vascular: No hyperdense vessel or unexpected calcification. Skull: Normal. Negative for fracture or focal lesion. Sinuses/Orbits: No acute finding. Other: None. IMPRESSION: 1. No acute intracranial abnormalities. Electronically Signed   By: Kerby Moors M.D.   On: 10/11/2016 20:40   Mr Brain Wo Contrast  Result Date: 10/12/2016 CLINICAL DATA:  76 y/o M; dizziness, blurred vision, presyncopal feeling, frontal headache. EXAM: MRI HEAD WITHOUT CONTRAST TECHNIQUE: Multiplanar, multiecho pulse sequences of the brain and surrounding structures were obtained without intravenous contrast. COMPARISON:  10/11/2016 CT head FINDINGS: Brain: Subcentimeter focus of reduced diffusion within the right forceps of splenium of corpus callosum compatible with acute/early subacute infarction. Few additional possible punctate foci of diffusion signal abnormality in the frontal lobes (series 3, image 31, 38, 42). No abnormal susceptibility hypointensity to indicate intracranial hemorrhage. Motion degraded T2 and T2 FLAIR weighted sequences. Background of mild chronic microvascular ischemic changes  and parenchymal volume loss of the brain for age. No focal mass effect, hydrocephalus, extra-axial collection, or effacement of basilar cisterns. Vascular: Normal flow voids. Skull and upper cervical spine: Normal marrow signal. Sinuses/Orbits: Negative. Other: None. IMPRESSION: 1. Motion degradation on multiple sequences. 2. 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 Possible additional punctate foci of reduced diffusion in the frontal lobes. 3. No acute hemorrhage or focal mass effect. 4. Mild chronic microvascular ischemic changes and parenchymal volume loss of the brain for age. These results were called by telephone at  the time of interpretation on 10/12/2016 at 12:40 am to Dr. Gareth Morgan , who verbally acknowledged these results. Electronically Signed   By: Kristine Garbe M.D.   On: 10/12/2016 00:41    Procedures Procedures (including critical care time)  Medications Ordered in ED Medications  sodium chloride 0.9 % bolus 1,000 mL (1,000 mLs Intravenous New Bag/Given 10/11/16 2323)  meclizine (ANTIVERT) tablet 25 mg (25 mg Oral Given 10/11/16 2323)     Initial Impression / Assessment and Plan / ED Course  I have reviewed the triage vital signs and the nursing notes.  Pertinent labs & imaging results that were available during my care of the patient were reviewed by me and considered in my medical decision making (see chart for details).     76yo male with hx of CAD, recent DES x3 8/28, hypertension, hyperlipidemia, TIA, presents with concern for dizziness.  By history, it is difficult to discern whether this is a lightheadedness/near-syncope or vertigo of central or peripheral origin.  Hemoglobin within normal limits. Troponin within normal limits. Electrolytes within normal limits. Patient with bradycardia to the 40s, however normal blood pressures, however would consider this possible etiology of lightheadedness secondary  to bystolic. Given fluid, meclizine.  Neurologic exam WNL, however concern symptoms may represent CVA or TIA given vertigo, risk factors, difficult ambulating at the time.    MR shows concern for acute CVA.  Consulted hospitalist for admission and spoke to Dr. Maudie Mercury. Placed consult order to Neurology.  Final Clinical Impressions(s) / ED Diagnoses   Final diagnoses:  Cerebrovascular accident (CVA), unspecified mechanism ([place])  Dizziness    New Prescriptions New Prescriptions   No medications on file     Gareth Morgan, MD 10/12/16 0126

## 2016-10-11 NOTE — ED Triage Notes (Signed)
Pt presents with wife from restaurant in [place], pt states @ [phone] he began having dizziness, blurred vision, feeling presyncopal and began with frontal HA. Pt initially gave wrong DOB. Otherwise neuro intact, -VAN scale. Cardiac stents placed 4 weeks ago.  Pt continues to have generalized weakness.

## 2016-10-11 NOTE — ED Notes (Signed)
Sent labels down to add on mag, hfp and trop

## 2016-10-11 NOTE — ED Notes (Signed)
Patient transported to MRI 

## 2016-10-12 ENCOUNTER — Inpatient Hospital Stay (HOSPITAL_COMMUNITY): Payer: Medicare Other

## 2016-10-12 ENCOUNTER — Inpatient Hospital Stay (HOSPITAL_BASED_OUTPATIENT_CLINIC_OR_DEPARTMENT_OTHER): Payer: Medicare Other

## 2016-10-12 ENCOUNTER — Encounter (HOSPITAL_COMMUNITY): Payer: Self-pay | Admitting: Internal Medicine

## 2016-10-12 DIAGNOSIS — E785 Hyperlipidemia, unspecified: Secondary | ICD-10-CM | POA: Diagnosis not present

## 2016-10-12 DIAGNOSIS — I639 Cerebral infarction, unspecified: Secondary | ICD-10-CM

## 2016-10-12 DIAGNOSIS — I6523 Occlusion and stenosis of bilateral carotid arteries: Secondary | ICD-10-CM | POA: Diagnosis not present

## 2016-10-12 DIAGNOSIS — R42 Dizziness and giddiness: Secondary | ICD-10-CM

## 2016-10-12 DIAGNOSIS — I10 Essential (primary) hypertension: Secondary | ICD-10-CM | POA: Diagnosis not present

## 2016-10-12 LAB — GLUCOSE, CAPILLARY: Glucose-Capillary: 92 mg/dL (ref 65–99)

## 2016-10-12 LAB — COMPREHENSIVE METABOLIC PANEL
ALT: 18 U/L (ref 17–63)
AST: 18 U/L (ref 15–41)
Albumin: 3.6 g/dL (ref 3.5–5.0)
Alkaline Phosphatase: 67 U/L (ref 38–126)
Anion gap: 6 (ref 5–15)
BUN: 16 mg/dL (ref 6–20)
CO2: 24 mmol/L (ref 22–32)
Calcium: 8.7 mg/dL — ABNORMAL LOW (ref 8.9–10.3)
Chloride: 107 mmol/L (ref 101–111)
Creatinine, Ser: 0.91 mg/dL (ref 0.61–1.24)
GFR calc Af Amer: >60 mL/min (ref >60)
GFR calc non Af Amer: >60 mL/min (ref >60)
Glucose, Bld: 103 mg/dL — ABNORMAL HIGH (ref 65–99)
Potassium: 4.2 mmol/L (ref 3.5–5.1)
Sodium: 137 mmol/L (ref 135–145)
Total Bilirubin: 0.6 mg/dL (ref 0.3–1.2)
Total Protein: 6.1 g/dL — ABNORMAL LOW (ref 6.5–8.1)

## 2016-10-12 LAB — OSMOLALITY: Osmolality: 294 mOsm/kg (ref 275–295)

## 2016-10-12 LAB — LIPID PANEL
Cholesterol: 134 mg/dL (ref 0–200)
HDL: 21 mg/dL — ABNORMAL LOW (ref >40)
LDL Cholesterol: UNDETERMINED mg/dL (ref 0–99)
Total CHOL/HDL Ratio: 6.4 RATIO
Triglycerides: 505 mg/dL — ABNORMAL HIGH (ref <150)
VLDL: UNDETERMINED mg/dL (ref 0–40)

## 2016-10-12 LAB — TROPONIN I
Troponin I: <0.03 ng/mL (ref <0.03)
Troponin I: <0.03 ng/mL (ref <0.03)
Troponin I: <0.03 ng/mL (ref <0.03)

## 2016-10-12 LAB — HEMOGLOBIN A1C
Hgb A1c MFr Bld: 5.1 % (ref 4.8–5.6)
Mean Plasma Glucose: 99.67 mg/dL

## 2016-10-12 LAB — CBC
HCT: 37.6 % — ABNORMAL LOW (ref 39.0–52.0)
Hemoglobin: 12.6 g/dL — ABNORMAL LOW (ref 13.0–17.0)
MCH: 29.9 pg (ref 26.0–34.0)
MCHC: 33.5 g/dL (ref 30.0–36.0)
MCV: 89.3 fL (ref 78.0–100.0)
Platelets: 106 10*3/uL — ABNORMAL LOW (ref 150–400)
RBC: 4.21 MIL/uL — ABNORMAL LOW (ref 4.22–5.81)
RDW: 14.3 % (ref 11.5–15.5)
WBC: 5.5 10*3/uL (ref 4.0–10.5)

## 2016-10-12 LAB — SODIUM, URINE, RANDOM: Sodium, Ur: 82 mmol/L

## 2016-10-12 LAB — CORTISOL: Cortisol, Plasma: 12.7 ug/dL

## 2016-10-12 LAB — TSH: TSH: 2.786 u[IU]/mL (ref 0.350–4.500)

## 2016-10-12 LAB — OSMOLALITY, URINE: Osmolality, Ur: 535 mOsm/kg (ref 300–900)

## 2016-10-12 MED ORDER — ASPIRIN EC 325 MG PO TBEC
325.0000 mg | DELAYED_RELEASE_TABLET | Freq: Every day | ORAL | Status: DC
  Administered 2016-10-12: 325 mg via ORAL
  Filled 2016-10-12: qty 1

## 2016-10-12 MED ORDER — FENOFIBRATE 160 MG PO TABS: 160.0000 mg | ORAL_TABLET | Freq: Every day | ORAL | 3 refills | Status: DC

## 2016-10-12 MED ORDER — CLOPIDOGREL BISULFATE 75 MG PO TABS
75.0000 mg | ORAL_TABLET | Freq: Every day | ORAL | Status: DC
  Administered 2016-10-12: 75 mg via ORAL
  Filled 2016-10-12: qty 1

## 2016-10-12 MED ORDER — NITROGLYCERIN 0.4 MG SL SUBL: 0.4000 mg | SUBLINGUAL_TABLET | SUBLINGUAL | Status: DC | PRN

## 2016-10-12 MED ORDER — INSULIN ASPART 100 UNIT/ML ~~LOC~~ SOLN: 0.0000 [IU] | Freq: Three times a day (TID) | SUBCUTANEOUS | Status: DC

## 2016-10-12 MED ORDER — ASPIRIN 325 MG PO TBEC: 325.0000 mg | DELAYED_RELEASE_TABLET | Freq: Every day | ORAL | 4 refills | Status: DC

## 2016-10-12 MED ORDER — NEBIVOLOL HCL 10 MG PO TABS
10.0000 mg | ORAL_TABLET | Freq: Every day | ORAL | Status: DC
  Filled 2016-10-12: qty 1

## 2016-10-12 MED ORDER — ROSUVASTATIN CALCIUM 5 MG PO TABS: 5.0000 mg | ORAL_TABLET | Freq: Every day | ORAL | Status: DC

## 2016-10-12 MED ORDER — ATORVASTATIN CALCIUM 40 MG PO TABS: 40.0000 mg | ORAL_TABLET | Freq: Every day | ORAL | 4 refills | Status: DC

## 2016-10-12 MED ORDER — SODIUM CHLORIDE 0.9 % IV SOLN
INTRAVENOUS | Status: AC
  Administered 2016-10-12: 04:00:00 via INTRAVENOUS

## 2016-10-12 MED ORDER — ASPIRIN EC 81 MG PO TBEC: 81.0000 mg | DELAYED_RELEASE_TABLET | Freq: Every day | ORAL | Status: DC

## 2016-10-12 MED ORDER — LABETALOL HCL 5 MG/ML IV SOLN: 10.0000 mg | INTRAVENOUS | Status: DC | PRN

## 2016-10-12 MED ORDER — ACETAMINOPHEN 650 MG RE SUPP
650.0000 mg | RECTAL | Status: DC | PRN
Start: 2016-10-12 — End: 2016-10-12

## 2016-10-12 MED ORDER — IOPAMIDOL (ISOVUE-370) INJECTION 76%
INTRAVENOUS | Status: AC
Start: 2016-10-12 — End: 2016-10-12
  Administered 2016-10-12: 50 mL
  Filled 2016-10-12: qty 50

## 2016-10-12 MED ORDER — FENOFIBRATE 160 MG PO TABS
160.0000 mg | ORAL_TABLET | Freq: Every day | ORAL | Status: DC
  Administered 2016-10-12: 160 mg via ORAL
  Filled 2016-10-12: qty 1

## 2016-10-12 MED ORDER — PANTOPRAZOLE SODIUM 40 MG PO TBEC
40.0000 mg | DELAYED_RELEASE_TABLET | Freq: Every day | ORAL | Status: DC
  Administered 2016-10-12: 40 mg via ORAL
  Filled 2016-10-12: qty 1

## 2016-10-12 MED ORDER — ATORVASTATIN CALCIUM 40 MG PO TABS: 40.0000 mg | ORAL_TABLET | Freq: Every day | ORAL | Status: DC

## 2016-10-12 MED ORDER — ACETAMINOPHEN 160 MG/5ML PO SOLN: 650.0000 mg | ORAL | Status: DC | PRN

## 2016-10-12 MED ORDER — INSULIN ASPART 100 UNIT/ML ~~LOC~~ SOLN: 0.0000 [IU] | Freq: Every day | SUBCUTANEOUS | Status: DC

## 2016-10-12 MED ORDER — STROKE: EARLY STAGES OF RECOVERY BOOK
Freq: Once | Status: AC
  Administered 2016-10-12: 04:00:00
  Filled 2016-10-12: qty 1

## 2016-10-12 MED ORDER — ACETAMINOPHEN 325 MG PO TABS: 650.0000 mg | ORAL_TABLET | ORAL | Status: DC | PRN

## 2016-10-12 MED ORDER — LISINOPRIL 10 MG PO TABS
10.0000 mg | ORAL_TABLET | Freq: Every day | ORAL | Status: DC
  Administered 2016-10-12: 10 mg via ORAL
  Filled 2016-10-12: qty 1

## 2016-10-12 NOTE — Progress Notes (Signed)
   10/12/16 1000  Clinical Encounter Type  Visited With Patient and family together  Visit Type Initial  Referral From Chaplain  Consult/Referral To Chaplain  Spiritual Encounters  Spiritual Needs Emotional  Stress Factors  Patient Stress Factors Major life changes  Family Stress Factors Major life changes  Met with the PT and family together.  The family's concern is making sure the patient adheres to the care plan as far as resting especially when he gets out of the hospital.  The patient acknowledged that there was work that needed to be done the last time he got out of the hospital but did not listen to doctors orders.  PT reiterated that he will rest and that he will listen when he gets out of the hospital this time.  Mood of the room was upbeat.  Son, Brother, Wife of PT were present.

## 2016-10-12 NOTE — Progress Notes (Signed)
Pt discharged at this time.  Pt, spouse and family member had numerous questions about hospitalization, test results, and medications.  All answered and pt and spouse verbalize understanding of all discharge instructions, medications, prescriptions, and follow up appointments. Pt has all belongings with him.  Home via car with spouse.

## 2016-10-12 NOTE — H&P (Addendum)
TRH H&P   Patient Demographics:    Colin Lopez, is a 76 y.o. male  MRN: [id number]   DOB - October 22, 1940  Admit Date - 10/11/2016  Outpatient Primary MD for the patient is Garnette Gunner, Coralie Keens, NP  Referring MD/NP/PA:  Dr. Billy Fischer  Outpatient Specialists:  Dr. [name] (cardiology)  Patient coming from: home  Chief Complaint  Patient presents with  . Weakness      HPI:    Colin Lopez  is a 76 y.o. male, w hypertension, hyperlipidemia, CAD s/p stent, TIA apparently presents with c/o eating supper and felt dizzy and everything was moving and felt generally weak and had slight headache. + vertigo. Slight hearing loss out of left ear, no tinnitus  In ED  MRI brain=>  IMPRESSION: 1. Motion degradation on multiple sequences. 2. 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 Possible additional punctate foci of reduced diffusion in the frontal lobes. 3. No acute hemorrhage or focal mass effect. 4. Mild chronic microvascular ischemic changes and parenchymal volume loss of the brain for age.  Pt will be admitted for stroke and mild hyponatremia.     Review of systems:    In addition to the HPI above, No Fever-chills,  No changes with Vision or hearing, No problems swallowing food or Liquids, No Chest pain, Cough or Shortness of Breath, No Abdominal pain, No Nausea or Vommitting, Bowel movements are regular, No Blood in stool or Urine, No dysuria, No new skin rashes or bruises, No new joints pains-aches,  Slight tingling in hands bilaterally No recent weight gain or loss, No polyuria, polydypsia or polyphagia, No significant Mental Stressors.  A full 10 point Review of Systems was done, except as stated above, all other Review of Systems were negative.   With Past History of the following :    Past Medical History:    Diagnosis Date  . CAD (coronary artery disease), native coronary artery    09/09/16 PCI/DES x3 to mRCA, OM1 and dLcx/OM2.   . Coronary disease    Status post stenting of the left circumflex coronary in 2009 with a bare-metal stent (with a 3.5x42mm Liberte stent)  . Dyslipidemia   . 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 Headache   . Hyperlipidemia   . Hypertension   . NSTEMI (non-ST elevated myocardial infarction) ([place]) 2009   BMS CFX  . TIA (transient ischemic attack)    history of tia      Past Surgical History:  Procedure Laterality Date  . CARDIOVASCULAR STRESS TEST  10-03-08   EF 59%  . CORONARY ANGIOPLASTY WITH STENT PLACEMENT  09/09/2016  . CORONARY STENT INTERVENTION N/A 09/09/2016   Procedure: CORONARY STENT INTERVENTION;  Surgeon: [name], [name] M, MD;  Location: [facility];  Service: Cardiovascular;  Laterality: N/A;  . LEFT HEART CATH AND CORONARY ANGIOGRAPHY N/A 09/09/2016  Procedure: LEFT HEART CATH AND CORONARY ANGIOGRAPHY;  Surgeon: [name], [name] M, MD;  Location: [facility];  Service: Cardiovascular;  Laterality: N/A;  . US ECHOCARDIOGRAPHY  09-21-08   EF 55-60%      Social History:     Social History  Substance Use Topics  . Smoking status: Never Smoker  . Smokeless tobacco: Never Used  . Alcohol use Yes     Comment: Rarely     Lives - at home  Mobility - walks  By self  Family History :     Family History  Problem Relation Age of Onset  . Diabetes Mother   . CAD Sister 13       MI, obese  . Cancer Brother        stomach      Home Medications:   Prior to Admission medications   Medication Sig Start Date End Date Taking? Authorizing Provider  acetaminophen (TYLENOL) 500 MG tablet Take 1,000 mg by mouth every 6 (six) hours as needed for headache (pain).   Yes [provider]  aspirin EC 81 MG tablet Take 1 tablet (81 mg total) by mouth daily. 11/06/13  Yes [name], [name] M, MD  clopidogrel (PLAVIX) 75 MG tablet Take 1 tablet (75 mg  total) by mouth daily with breakfast. 09/10/16  Yes Cheryln Manly, NP  Krill Oil 1000 MG CAPS Take 1,000 mg by mouth at bedtime.   Yes [provider]  lisinopril (PRINIVIL,ZESTRIL) 10 MG tablet Take 1 tablet (10 mg total) by mouth daily. 09/10/16  Yes Reino Bellis B, NP  nebivolol (BYSTOLIC) 10 MG tablet Take 1 tablet (10 mg total) by mouth daily. Patient taking differently: Take 10 mg by mouth at bedtime.  08/26/16  Yes [name], [name] M, MD  nitroGLYCERIN (NITROSTAT) 0.4 MG SL tablet Place 1 tablet (0.4 mg total) under the tongue every 5 (five) minutes as needed for chest pain. 02/27/16  Yes Barrett, Evelene Croon, PA-C  OVER THE COUNTER MEDICATION Apply 1 application topically daily as needed (pain). Horse Liniment otc cream   Yes [provider]  pantoprazole (PROTONIX) 40 MG tablet Take 1 tablet (40 mg total) by mouth daily. 08/25/16  Yes [name], [name] M, MD  rosuvastatin (CRESTOR) 5 MG tablet Take 1 tablet (5 mg total) by mouth daily. Patient taking differently: Take 5 mg by mouth at bedtime.  09/10/16  Yes Cheryln Manly, NP     Allergies:     Allergies  Allergen Reactions  . Penicillins Hives    Has patient had a PCN reaction causing immediate rash, facial/tongue/throat swelling, SOB or lightheadedness with hypotension: No Has patient had a PCN reaction causing severe rash involving mucus membranes or skin necrosis: Yes Has patient had a PCN reaction that required hospitalization: No Has patient had a PCN reaction occurring within the last 10 years: No If all of the above answers are "NO", then may proceed with Cephalosporin use.   [name] Doxycycline Rash    Water blisters  . Levaquin [Levofloxacin In D5w] Rash     Physical Exam:   Vitals  Blood pressure (!) 124/99, pulse (!) 34, temperature 97.8 F (36.6 C), temperature source Oral, resp. rate 17, height 6\' 1"  (1.854 m), weight 94.8 kg (209 lb), SpO2 93 %.   1. General  lying in bed in NAD,   2. Normal  affect and insight, Not Suicidal or Homicidal, Awake Alert, Oriented X 3.  3. No F.N deficits, ALL C.Nerves Intact,  Strength 5/5 all 4 extremities, Sensation intact all 4 extremities, Plantars down going.  4. Ears and Eyes appear Normal, Conjunctivae clear, PERRLA. Moist Oral Mucosa.  5. Supple Neck, No JVD, No cervical lymphadenopathy appriciated, No Carotid Bruits.  6. Symmetrical Chest wall movement, Good air movement bilaterally, CTAB.  7. RRR, No Gallops, Rubs or Murmurs, No Parasternal Heave.  8. Positive Bowel Sounds, Abdomen Soft, No tenderness, No organomegaly appriciated,No rebound -guarding or rigidity.  9.  No Cyanosis, Normal Skin Turgor, No Skin Rash or Bruise.  10. Good muscle tone,  joints appear normal , no effusions, Normal ROM.  11. No Palpable Lymph Nodes in Neck or Axillae  No pronator drift. Good finger to nose   Data Review:    CBC  Recent Labs Lab 10/11/16 1948  WBC 6.5  HGB 13.2  HCT 38.1*  PLT 127*  MCV 88.8  MCH 30.8  MCHC 34.6  RDW 14.2   ------------------------------------------------------------------------------------------------------------------  Chemistries   Recent Labs Lab 10/11/16 1948  NA 133*  K 4.3  CL 103  CO2 23  GLUCOSE 141*  BUN 16  CREATININE 0.90  CALCIUM 8.7*  MG 2.3  AST 23  ALT 16*  ALKPHOS 72  BILITOT 0.8   ------------------------------------------------------------------------------------------------------------------ estimated creatinine clearance is 78.9 mL/min (by C-G formula based on SCr of 0.9 mg/dL). ------------------------------------------------------------------------------------------------------------------ No results for input(s): TSH, T4TOTAL, T3FREE, THYROIDAB in the last 72 hours.  Invalid input(s): FREET3  Coagulation profile No results for input(s): INR, PROTIME in the last 168  hours. ------------------------------------------------------------------------------------------------------------------- No results for input(s): DDIMER in the last 72 hours. -------------------------------------------------------------------------------------------------------------------  Cardiac Enzymes  Recent Labs Lab 10/11/16 1948  TROPONINI <0.03   ------------------------------------------------------------------------------------------------------------------ No results found for: BNP   ---------------------------------------------------------------------------------------------------------------  Urinalysis    Component Value Date/Time   COLORURINE YELLOW 10/11/2016 1945   APPEARANCEUR CLEAR 10/11/2016 1945   APPEARANCEUR Clear 11/24/2012 1910   LABSPEC 1.014 10/11/2016 1945   LABSPEC 1.017 11/24/2012 1910   PHURINE 5.0 10/11/2016 1945   GLUCOSEU NEGATIVE 10/11/2016 1945   GLUCOSEU Negative 11/24/2012 1910   HGBUR NEGATIVE 10/11/2016 1945   BILIRUBINUR NEGATIVE 10/11/2016 1945   BILIRUBINUR neg 12/05/2014 1720   BILIRUBINUR Negative 11/24/2012 1910   KETONESUR NEGATIVE 10/11/2016 1945   PROTEINUR NEGATIVE 10/11/2016 1945   UROBILINOGEN negative 12/05/2014 1720   UROBILINOGEN 1.0 07/11/2013 2253   NITRITE NEGATIVE 10/11/2016 1945   LEUKOCYTESUR NEGATIVE 10/11/2016 1945   LEUKOCYTESUR Negative 11/24/2012 1910    ----------------------------------------------------------------------------------------------------------------   Imaging Results:    Dg Chest 2 View  Result Date: 10/11/2016 CLINICAL DATA:  76 y/o M; recent cardiac stent placement. Presenting with dizziness, blurred vision, presyncope. EXAM: CHEST  2 VIEW COMPARISON:  09/30/2016 chest radiograph.  06/30/2016 PET-CT. FINDINGS: Normal cardiac silhouette. Aortic atherosclerosis with calcification. Stable nodule within the left lower lobe. No consolidation. No pleural effusion or pneumothorax.  Multilevel degenerative changes of the thoracic spine. IMPRESSION: No acute pulmonary process identified. Electronically Signed   By: Kristine Garbe M.D.   On: 10/11/2016 23:18   Ct Head Wo Contrast  Result Date: 10/11/2016 CLINICAL DATA:  Headache.  Chronic neuro deficit. EXAM: CT HEAD WITHOUT CONTRAST TECHNIQUE: Contiguous axial images were obtained from the base of the skull through the vertex without intravenous contrast. COMPARISON:  None. FINDINGS: Brain: No evidence of acute infarction, hemorrhage, hydrocephalus, extra-axial collection or mass lesion/mass effect. Vascular: No hyperdense vessel or unexpected calcification. Skull: Normal. Negative for fracture or focal lesion. Sinuses/Orbits: No acute finding. Other: None. IMPRESSION: 1. No  acute intracranial abnormalities. Electronically Signed   By: Kerby Moors M.D.   On: 10/11/2016 20:40   Mr Brain Wo Contrast  Result Date: 10/12/2016 CLINICAL DATA:  76 y/o M; dizziness, blurred vision, presyncopal feeling, frontal headache. EXAM: MRI HEAD WITHOUT CONTRAST TECHNIQUE: Multiplanar, multiecho pulse sequences of the brain and surrounding structures were obtained without intravenous contrast. COMPARISON:  10/11/2016 CT head FINDINGS: Brain: Subcentimeter focus of reduced diffusion within the right forceps of splenium of corpus callosum compatible with acute/early subacute infarction. Few additional possible punctate foci of diffusion signal abnormality in the frontal lobes (series 3, image 31, 38, 42). No abnormal susceptibility hypointensity to indicate intracranial hemorrhage. Motion degraded T2 and T2 FLAIR weighted sequences. Background of mild chronic microvascular ischemic changes and parenchymal volume loss of the brain for age. No focal mass effect, hydrocephalus, extra-axial collection, or effacement of basilar cisterns. Vascular: Normal flow voids. Skull and upper cervical spine: Normal marrow signal. Sinuses/Orbits: Negative.  Other: None. IMPRESSION: 1. Motion degradation on multiple sequences. 2. 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 Possible additional punctate foci of reduced diffusion in the frontal lobes. 3. No acute hemorrhage or focal mass effect. 4. Mild chronic microvascular ischemic changes and parenchymal volume loss of the brain for age. These results were called by telephone at the time of interpretation on 10/12/2016 at 12:40 am to Dr. Gareth Morgan , who verbally acknowledged these results. Electronically Signed   By: Kristine Garbe M.D.   On: 10/12/2016 00:41      Assessment & Plan:    Principal Problem:   Stroke (cerebrum) [facility]    Stroke Check carotid ultrasound Check cardiac echo Check hga1c, check lipid Aspirin, plavix crestor Neurology consulted by ED, appreciate input.  May need TEE if CVA thought to be embolic  Hyponatremia Check serum osm, tsh, cortisol Check urine sodium, urine osm Check cmp in am after hydration w ns iv  Thrombocytopenia Check cbc in am  Bradycardia Check tsh Trop I q6h x3  Hypertension Continue current bp medication   DVT Prophylaxis  SCDs   AM Labs Ordered, also please review Full Orders  Family Communication: Admission, patients condition and plan of care including tests being ordered have been discussed with the patient who indicate understanding and agree with the plan and Code Status.  Code Status FULL CODE  Likely DC to  home  Condition GUARDED    Consults called: neurology by ED  Admission status: inpatient  Time spent in minutes : 45   Jani Gravel M.D on 10/12/2016 at 2:01 AM  Between 7am to 7pm - Pager - [phone]. After 7pm go to [email or web address] - password [facility]  [facility] - Office  [phone]

## 2016-10-12 NOTE — Care Management CC44 (Signed)
Condition Code 44 Documentation Completed  Patient Details  Name: Colin Lopez MRN: [id number] Date of Birth: [birth year]   Condition Code 44 given:  Yes Patient signature on Condition Code 44 notice:  Yes Documentation of 2 MD's agreement:  Yes Code 44 added to claim:  Yes    [name], RN 10/12/2016, 3:15 PM

## 2016-10-12 NOTE — Therapy (Signed)
Occupational Therapy Evaluation Patient Details Name: Colin Lopez MRN: [id number] DOB: Aug 18, 1940 Today's Date: 10/12/2016    History of Present Illness Patient is a 76 y.o. male who presented to ED with c/o of blurred vision, dizziness, and HA. MRI revealed subcentimer acute/early subacute infarction of R splenium of corpus callosum and possible punctate foci in the frontal lobes.    Clinical Impression   Pt reports being independent with ADLs and IADLs and reports decreased near sight vision PTA. Currently pt requires supervision for ADLs and functional mobility for safety. Pt continues to present with near sighted vision deficits and reports some dizziness when picking items up off floor although this was also present PTA. Pt advised to follow up with eye doctor to further assess vision. Reviewed signs/symptoms of a stroke using BE FAST.  Pt reports family is available to provide 24 hour assistance upon d/c. No acute OT needs at this time. Pt safe to d/c home with supervision from family when medically stable.       Follow Up Recommendations  No OT follow up;Supervision - Intermittent (Pt advised to follow up with eye doctor. )    Equipment Recommendations   (Shower chair ) - pt declined.    Recommendations for Other Services       Precautions / Restrictions Precautions Precautions: Fall Restrictions Weight Bearing Restrictions: No      Mobility Bed Mobility Overal bed mobility: Modified Independent             General bed mobility comments:  Transfers Overall transfer level: Needs assistance Equipment used: None Transfers: Sit to/from Stand Sit to Stand: Supervision              Balance Overall balance assessment: Needs assistance Sitting-balance support: Feet supported Sitting balance-Leahy Scale: Good     Standing balance support: No upper extremity supported;During functional activity Standing balance-Leahy Scale: Good               High  level balance activites: Head turns;Sudden stops;Direction changes High Level Balance Comments: Pt able to tolerate high level balance challenges with no LOB.  Standardized Balance Assessment Standardized Balance Assessment : Dynamic Gait Index   Dynamic Gait Index Level Surface: Normal Change in Gait Speed: Normal Gait with Horizontal Head Turns: Normal Gait with Vertical Head Turns: Normal Gait and Pivot Turn: Normal Step Over Obstacle: Mild Impairment Step Around Obstacles: Normal Steps: Normal Total Score: 23     ADL either performed or assessed with clinical judgement   ADL Overall ADL's : Needs assistance/impaired                                       General ADL Comments: Pt required overall supervision for ADLs. Pt able to complete toliet transfer, peri care, and grooming task with supervision for safety and management of IV pole. Pt continues to report dizziness when picking up items from the floor. Pt advised to use a shower chair upon d/c home to increase safety with bathing and reduce risk of falls.      Vision Patient Visual Report: Other (comment) (Pt reports difficult with near vision.) Vision Assessment?: Yes Eye Alignment: Within Functional Limits Ocular Range of Motion: Within Functional Limits Alignment/Gaze Preference: Within Defined Limits Tracking/Visual Pursuits: Able to track stimulus in all quads without difficulty Convergence: Within functional limits Visual Fields: No apparent deficits Additional Comments: Pt reported blurred vision and  diffculty seeing faces far away when incident occured. Pt reports decreased near sight vision which is baseline for him. Pt reports that symptoms at admission have resolved and is back to baseline. Pt encouraged to see eye doctor upon d/c.      Perception     Praxis      Pertinent Vitals/Pain Pain Assessment: No/denies pain     Hand Dominance Right   Extremity/Trunk Assessment Upper Extremity  Assessment Upper Extremity Assessment: Overall WFL for tasks assessed   Lower Extremity Assessment Lower Extremity Assessment: Defer to PT evaluation   Cervical / Trunk Assessment Cervical / Trunk Assessment: Normal   Communication Communication Communication: HOH   Cognition Arousal/Alertness: Awake/alert Behavior During Therapy: WFL for tasks assessed/performed Overall Cognitive Status: Within Functional Limits for tasks assessed                                     General Comments  Pt reports vision and balance are close to baseline. Pt reported some dizziness when picking an item up off the floor. Pt advised to see PCP if pt feels continued/new issues with balance/dizziness and impacts his ability to complete occupations.     Exercises     Shoulder Instructions      Home Living Family/patient expects to be discharged to:: Private residence Living Arrangements: Spouse/significant other Available Help at Discharge: Family Type of Home: House Home Access: Stairs to enter [employer] of Steps: 8 Entrance Stairs-Rails: Left Home Layout: Two level;Able to live on main level with bedroom/bathroom     Bathroom Shower/Tub: Tub/shower unit;Curtain   Bathroom Toilet: Standard Bathroom Accessibility: Yes How Accessible: Accessible via walker Home Equipment: None          Prior Functioning/Environment Level of Independence: Independent        Comments: PTA pt was still driving. Pt enjoys working on cars and [employer] projects         OT Problem List:        OT Treatment/Interventions:      OT Goals(Current goals can be found in the care plan section) Acute Rehab OT Goals Patient Stated Goal: to go home OT Goal Formulation: With patient  OT Frequency:     Barriers to D/C:            Co-evaluation              AM-PAC PT "6 Clicks" Daily Activity     Outcome Measure Help from another person eating meals?: None Help from  another person taking care of personal grooming?: None Help from another person toileting, which includes using toliet, bedpan, or urinal?: None Help from another person bathing (including washing, rinsing, drying)?: None Help from another person to put on and taking off regular upper body clothing?: None Help from another person to put on and taking off regular lower body clothing?: None 6 Click Score: 24   End of Session Equipment Utilized During Treatment: Gait belt Nurse Communication: Mobility status (Pt requesting to bathe)  Activity Tolerance: Patient tolerated treatment well Patient left: in chair;with call bell/phone within reach  OT Visit Diagnosis: Unsteadiness on feet (R26.81);Low vision, both eyes (H54.2)                Time: 4034-7425 OT Time Calculation (min): 34 min Charges:    G-Codes:     Boykin Peek, OTS [phone]   Boykin Peek 10/12/2016, 12:16 PM

## 2016-10-12 NOTE — Care Management Obs Status (Signed)
[place] NOTIFICATION   Patient Details  Name: Colin Lopez MRN: [id number] Date of Birth: [birth year]   Medicare Observation Status Notification Given:  Yes    [name], RN 10/12/2016, 3:15 PM

## 2016-10-12 NOTE — Consult Note (Signed)
Requesting Physician: Dr. Billy Fischer    Chief Complaint: Dizziness, imablance,   History obtained from:    Patient and Chart   HPI:                                                                                                                                       Colin Lopez is an 76 y.o. male white right-handed male with PMH of coronary artery disease status post stents into 4, hyperlipidemia, TIA who was out a restaurant with his wife when he had sudden onset dizziness and blurry vision.  He states that around p.m. he was with his wife and all of a sudden he felt very dizzy sensation of the room spinning, blurry vision, nausea and felt like he went to pass out. He had difficulty with balance and they decided to drive to the emergency room. Here I did [facility] around 7:30 PM and his symptoms were improving. He still feels a little ataxic on walking for remaining symptoms have resolved. He no longer complains of blurred vision, dizziness. He denies any tinnitus, slurred speech, double vision aphasia or confusion. His blood pressure on arrival was 117/65. He takes aspirin 81 mg and  Plavix 75 mg as he had recent cardiac stents placed 6 weeks ago  MRI performed the emergency room which demonstrated a splenium infarct. Patient was admitted for stroke workup neurology was consulted  Date last known well: 9.30.18 Time last known well: 6 PM tPA Given: No, not brought in as a stroke alert NIHSS: 0 currently Baseline MRS 0     Past Medical History:  Diagnosis Date  . CAD (coronary artery disease), native coronary artery    09/09/16 PCI/DES x3 to mRCA, OM1 and dLcx/OM2.   . Coronary disease    Status post stenting of the left circumflex coronary in 2009 with a bare-metal stent (with a 3.5x5mm Liberte stent)  . Dyslipidemia   . 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 Headache   . Hyperlipidemia   . Hypertension   . NSTEMI (non-ST elevated myocardial infarction) ([place]) 2009   BMS CFX  . TIA (transient ischemic  attack)    history of tia    Past Surgical History:  Procedure Laterality Date  . CARDIOVASCULAR STRESS TEST  10-03-08   EF 59%  . CORONARY ANGIOPLASTY WITH STENT PLACEMENT  09/09/2016  . CORONARY STENT INTERVENTION N/A 09/09/2016   Procedure: CORONARY STENT INTERVENTION;  Surgeon: [name], [name] M, MD;  Location: [place] [facility];  Service: Cardiovascular;  Laterality: N/A;  . LEFT HEART CATH AND CORONARY ANGIOGRAPHY N/A 09/09/2016   Procedure: LEFT HEART CATH AND CORONARY ANGIOGRAPHY;  Surgeon: [name], [name] M, MD;  Location: [place] [facility];  Service: Cardiovascular;  Laterality: N/A;  . US ECHOCARDIOGRAPHY  09-21-08   EF 55-60%    Family History  Problem Relation Age of Onset  . Diabetes Mother   .  CAD Sister 97       MI, obese  . Cancer Brother        stomach   Social History:  reports that he has never smoked. He has never used smokeless tobacco. He reports that he drinks alcohol. He reports that he does not use drugs.  Allergies:  Allergies  Allergen Reactions  . Penicillins Hives    Has patient had a PCN reaction causing immediate rash, facial/tongue/throat swelling, SOB or lightheadedness with hypotension: No Has patient had a PCN reaction causing severe rash involving mucus membranes or skin necrosis: Yes Has patient had a PCN reaction that required hospitalization: No Has patient had a PCN reaction occurring within the last 10 years: No If all of the above answers are "NO", then may proceed with Cephalosporin use.   [name] Doxycycline Rash    Water blisters  . Levaquin [Levofloxacin In D5w] Rash    Medications:                                                                                                                        I reviewed home medications   ROS:                                                                                                                                     14 systems reviewed and negative except above    Examination:                                                                                                       General: Appears well-developed and well-nourished.  Psych: Affect appropriate to situation Eyes: No scleral injection HENT: No OP obstrucion Head: Normocephalic.  Cardiovascular: Normal rate and regular rhythm.  Respiratory: Effort normal and breath sounds normal to anterior ascultation GI: Soft.  No distension. There is no tenderness.  Skin: WDI   Neurological Examination Mental Status: Alert, oriented, thought content appropriate.  Speech fluent without evidence of aphasia.  Able to follow 3 step commands without difficulty. Cranial Nerves: II: Discs flat bilaterally; Visual fields grossly normal,  III,IV, VI: ptosis not present, extra-ocular motions intact bilaterally, pupils equal, round, reactive to light and accommodation V,VII: smile symmetric, facial light touch sensation normal bilaterally VIII: hearing normal bilaterally IX,X: uvula rises symmetrically XI: bilateral shoulder shrug XII: midline tongue extension Motor: Right : Upper extremity   5/5    Left:     Upper extremity   5/5  Lower extremity   5/5     Lower extremity   5/5 Tone and bulk:normal tone throughout; no atrophy noted Sensory: Pinprick and light touch intact throughout, bilaterally Deep Tendon Reflexes: 2+ and symmetric throughout Plantars: Right: downgoing   Left: downgoing Cerebellar: normal finger-to-nose, normal rapid alternating movements and normal heel-to-shin test Gait: Mildly ataxic gait   Lab Results: Basic Metabolic Panel:  Recent Labs Lab 10/11/16 1948  NA 133*  K 4.3  CL 103  CO2 23  GLUCOSE 141*  BUN 16  CREATININE 0.90  CALCIUM 8.7*  MG 2.3    CBC:  Recent Labs Lab 10/11/16 1948 10/12/16 0342  WBC 6.5 5.5  HGB 13.2 12.6*  HCT 38.1* 37.6*  MCV 88.8 89.3  PLT 127* PENDING    Coagulation Studies: No results for input(s): LABPROT, INR in the last 72 hours.  Imaging: Dg  Chest 2 View  Result Date: 10/11/2016 CLINICAL DATA:  76 y/o M; recent cardiac stent placement. Presenting with dizziness, blurred vision, presyncope. EXAM: CHEST  2 VIEW COMPARISON:  09/30/2016 chest radiograph.  06/30/2016 PET-CT. FINDINGS: Normal cardiac silhouette. Aortic atherosclerosis with calcification. Stable nodule within the left lower lobe. No consolidation. No pleural effusion or pneumothorax. Multilevel degenerative changes of the thoracic spine. IMPRESSION: No acute pulmonary process identified. Electronically Signed   By: Kristine Garbe M.D.   On: 10/11/2016 23:18   Ct Head Wo Contrast  Result Date: 10/11/2016 CLINICAL DATA:  Headache.  Chronic neuro deficit. EXAM: CT HEAD WITHOUT CONTRAST TECHNIQUE: Contiguous axial images were obtained from the base of the skull through the vertex without intravenous contrast. COMPARISON:  None. FINDINGS: Brain: No evidence of acute infarction, hemorrhage, hydrocephalus, extra-axial collection or mass lesion/mass effect. Vascular: No hyperdense vessel or unexpected calcification. Skull: Normal. Negative for fracture or focal lesion. Sinuses/Orbits: No acute finding. Other: None. IMPRESSION: 1. No acute intracranial abnormalities. Electronically Signed   By: Kerby Moors M.D.   On: 10/11/2016 20:40   Mr Brain Wo Contrast  Result Date: 10/12/2016 CLINICAL DATA:  76 y/o M; dizziness, blurred vision, presyncopal feeling, frontal headache. EXAM: MRI HEAD WITHOUT CONTRAST TECHNIQUE: Multiplanar, multiecho pulse sequences of the brain and surrounding structures were obtained without intravenous contrast. COMPARISON:  10/11/2016 CT head FINDINGS: Brain: Subcentimeter focus of reduced diffusion within the right forceps of splenium of corpus callosum compatible with acute/early subacute infarction. Few additional possible punctate foci of diffusion signal abnormality in the frontal lobes (series 3, image 31, 38, 42). No abnormal susceptibility  hypointensity to indicate intracranial hemorrhage. Motion degraded T2 and T2 FLAIR weighted sequences. Background of mild chronic microvascular ischemic changes and parenchymal volume loss of the brain for age. No focal mass effect, hydrocephalus, extra-axial collection, or effacement of basilar cisterns. Vascular: Normal flow voids. Skull and upper cervical spine: Normal marrow signal. Sinuses/Orbits: Negative. Other: None. IMPRESSION: 1. Motion degradation on multiple sequences. 2. 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 Possible additional punctate foci of reduced diffusion in the frontal lobes.  3. No acute hemorrhage or focal mass effect. 4. Mild chronic microvascular ischemic changes and parenchymal volume loss of the brain for age. These results were called by telephone at the time of interpretation on 10/12/2016 at 12:40 am to Dr. Gareth Morgan , who verbally acknowledged these results. Electronically Signed   By: Kristine Garbe M.D.   On: 10/12/2016 00:41     ASSESSMENT AND PLAN  76 y.o. male white right-handed male with PMH of coronary artery disease status post stents into 4, hyperlipidemia, TIA who was out a restaurant with his wife when he had sudden onset dizziness and blurry vision. 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 Etiology of stroke is likely atherosclerotic disease versus cardioembolic   Recommend #MRA Head and neck  #Transthoracic Echo  # Continue patient on aspirin 81 mg and Plavix 75 mg  #Start or continue Atorvastatin 80 mg/other high intensity statin # BP goal: permissive HTN upto 578 systolic, PRNs above # HBAIC and Lipid profile # Telemetry monitoring # Frequent neuro checks # NPO until passes stroke swallow screen  Please page stroke NP  Or  PA  Or MD from 8am -4 pm  as this patient from this time will be  followed by the stroke.   You can look them up on  [email or web address]  Password [facility]  Colin Lopez [facility] Pager Number [phone]

## 2016-10-12 NOTE — Progress Notes (Signed)
*  PRELIMINARY RESULTS* Vascular Ultrasound Carotid Duplex (Doppler) has been completed.  Preliminary findings: Bilateral 1-39% ICA stenosis, antegrade vertebral flow.  Myrtie Cruise Zolton Dowson 10/12/2016, 1:24 PM

## 2016-10-12 NOTE — ED Notes (Signed)
Sent add on label to main lab for  sosm.

## 2016-10-12 NOTE — Progress Notes (Signed)
[facility]                                                                              Patient Demographics  Colin Lopez, is a 76 y.o. male, DOB - 16-Aug-1940, OXB:[id number]  Admit date - 10/11/2016   Admitting Physician Jani Gravel, MD  Outpatient Primary MD for the patient is Garnette Gunner Coralie Keens, NP  Outpatient specialists:   LOS - 0  days   Medical records reviewed and are as summarized below:    Chief Complaint  Patient presents with  . Weakness       Brief summary  Colin Lopez  is a 76 y.o. male, w hypertension, hyperlipidemia, CAD s/p stent, TIA apparently presents with c/o eating supper and felt dizzy and everything was moving and felt generally weak and had slight headache. + vertigo. Slight hearing loss out of left ear, no tinnitus. MRI of the brain showed acute/respiratory subacute infarction the right forceps of splenium of corpus callosum, punctate foci of reduced diffusion in the frontal lobes    Assessment & Plan    Principal Problem:   CVA (cerebral vascular accident) ([place]) - presented with headache, blurred vision, generalized weakness, dizziness - MRI of the brain showed acute/respiratory subacute infarction the right forceps of splenium of corpus callosum, punctate foci of reduced diffusion in the frontal lobes - CT angiogram of the head and neck pending - recent 2-D echo on 09/16/16 showed EF of 55-60% with grade 1 diastolic dysfunction, no wall motion abnormalities. Recent cardiac cath showed severe two-vessel obstructive CAD, successful stenting of mid RCA, OM 1, distal LCx, recommended DAPT for at least 1 year -  Neurology consulted, continue aspirin, Plavix - LDL showed cholesterol 134, LDL unable to calculate, triglycerides 505, placed on Lipitor, fenofibrate - Hemoglobin A1c 5.1 - PT evaluation recommended no PT follow-up    Active Problems:   Dyslipidemia - LDL showed cholesterol 134, LDL unable to calculate, triglycerides 505,  placed on Lipitor, fenofibrate    Essential hypertension - on lisinopril, bystolic  Code Status: full CODE STATUS DVT Prophylaxis:  SCD's Family Communication: Discussed in detail with the patient, all imaging results, lab results explained to the patient and wife   Disposition Plan: home one stroke workup is complete  Time Spent in minutes  25 minutes  Procedures:  MRI brain  Consultants:   neurology  Antimicrobials:      Medications  Scheduled Meds: . aspirin EC  325 mg Oral Daily  . atorvastatin  40 mg Oral q1800  . clopidogrel  75 mg Oral Daily  . fenofibrate  160 mg Oral Daily  . iopamidol      . lisinopril  10 mg Oral Daily  . nebivolol  10 mg Oral QHS  . pantoprazole  40 mg Oral Daily   Continuous Infusions: . sodium chloride 50 mL/hr at 10/12/16 0332   PRN Meds:.acetaminophen **OR** acetaminophen (TYLENOL) oral liquid 160 mg/5 mL **OR** acetaminophen, labetalol, nitroGLYCERIN   Antibiotics   Anti-infectives    None        Subjective:   Colin Lopez was seen and examined today. Feels a lot better  today, no headache or dizziness. Patient denies chest pain, shortness of breath, abdominal pain, N/V/D/C, new weakness, numbess, tingling. No acute events overnight.    Objective:   Vitals:   10/12/16 0300 10/12/16 0530 10/12/16 0730 10/12/16 0930  BP: (!) 194/86 (!) 162/59 (!) 163/76 (!) 154/74  Pulse: 71 (!) 54 63 74  Resp: 14 14 16 16   Temp: 97.6 F (36.4 C) 97.8 F (36.6 C) 97.9 F (36.6 C) 98 F (36.7 C)  TempSrc: Oral Oral Oral Oral  SpO2: 96% 97% 98% 97%  Weight:      Height:        Intake/Output Summary (Last 24 hours) at 10/12/16 0933 Last data filed at 10/12/16 0649  Gross per 24 hour  Intake            29.17 ml  Output              500 ml  Net          -470.83 ml     Wt Readings from Last 3 Encounters:  10/11/16 94.8 kg (209 lb)  09/30/16 94.8 kg (209 lb)  09/17/16 95.1 kg (209 lb 9.6 oz)     Exam  General: Alert  and oriented x 3, NAD  Eyes: PERRLA, EOMI, Anicteric Sclera,  HEENT:  Atraumatic, normocephalic  Cardiovascular: S1 S2 auscultated, no rubs, murmurs or gallops. Regular rate and rhythm.  Respiratory: Clear to auscultation bilaterally, no wheezing, rales or rhonchi  Gastrointestinal: Soft, nontender, nondistended, + bowel sounds  Ext: no pedal edema bilaterally  Neuro: AAOx3, Cr N's II- XII. Strength 5/5 upper and lower extremities bilaterally, speech clear, sensations grossly intact  Musculoskeletal: No digital cyanosis, clubbing  Skin: No rashes  Psych: Normal affect and demeanor, alert and oriented x3    Data Reviewed:  I have personally reviewed following labs and imaging studies  Micro Results No results found for this or any previous visit (from the past 240 hour(s)).  Radiology Reports Dg Chest 2 View  Result Date: 10/11/2016 CLINICAL DATA:  76 y/o M; recent cardiac stent placement. Presenting with dizziness, blurred vision, presyncope. EXAM: CHEST  2 VIEW COMPARISON:  09/30/2016 chest radiograph.  06/30/2016 PET-CT. FINDINGS: Normal cardiac silhouette. Aortic atherosclerosis with calcification. Stable nodule within the left lower lobe. No consolidation. No pleural effusion or pneumothorax. Multilevel degenerative changes of the thoracic spine. IMPRESSION: No acute pulmonary process identified. Electronically Signed   By: Kristine Garbe M.D.   On: 10/11/2016 23:18   Dg Chest 2 View  Result Date: 10/01/2016 CLINICAL DATA:  History of pneumonia, history of coronary artery stents, history of left lower lobe lung lesion EXAM: CHEST  2 VIEW COMPARISON:  PET-CT of 06/30/2016 and chest x-ray of 06/19/2016, CT chest of 04/21/2016 FINDINGS: There is a persistent nodular lesion at the mid left lung base which on CT is posteriorly positioned. This lesion measures approximately 2 cm on the current study measuring up to 2.5 cm on the prior CT. This has not increased in size over  the interval since the CT of 04/21/2016. Again a slow growing neoplasm cannot be excluded, although a benign process is favored. Otherwise the lungs are clear. Mediastinal and hilar contours are unremarkable. The heart is within upper limits normal. There are degenerative changes throughout the thoracic spine. IMPRESSION: 1. Stable nodule in the left lung base as described above. Slow-growing neoplasm cannot be excluded, although a benign process is favored. 2. No pneumonia or effusion. Electronically Signed   By: Eddie Dibbles  Alvester Chou M.D.   On: 10/01/2016 09:36   Ct Head Wo Contrast  Result Date: 10/11/2016 CLINICAL DATA:  Headache.  Chronic neuro deficit. EXAM: CT HEAD WITHOUT CONTRAST TECHNIQUE: Contiguous axial images were obtained from the base of the skull through the vertex without intravenous contrast. COMPARISON:  None. FINDINGS: Brain: No evidence of acute infarction, hemorrhage, hydrocephalus, extra-axial collection or mass lesion/mass effect. Vascular: No hyperdense vessel or unexpected calcification. Skull: Normal. Negative for fracture or focal lesion. Sinuses/Orbits: No acute finding. Other: None. IMPRESSION: 1. No acute intracranial abnormalities. Electronically Signed   By: Kerby Moors M.D.   On: 10/11/2016 20:40   Mr Brain Wo Contrast  Result Date: 10/12/2016 CLINICAL DATA:  76 y/o M; dizziness, blurred vision, presyncopal feeling, frontal headache. EXAM: MRI HEAD WITHOUT CONTRAST TECHNIQUE: Multiplanar, multiecho pulse sequences of the brain and surrounding structures were obtained without intravenous contrast. COMPARISON:  10/11/2016 CT head FINDINGS: Brain: Subcentimeter focus of reduced diffusion within the right forceps of splenium of corpus callosum compatible with acute/early subacute infarction. Few additional possible punctate foci of diffusion signal abnormality in the frontal lobes (series 3, image 31, 38, 42). No abnormal susceptibility hypointensity to indicate intracranial  hemorrhage. Motion degraded T2 and T2 FLAIR weighted sequences. Background of mild chronic microvascular ischemic changes and parenchymal volume loss of the brain for age. No focal mass effect, hydrocephalus, extra-axial collection, or effacement of basilar cisterns. Vascular: Normal flow voids. Skull and upper cervical spine: Normal marrow signal. Sinuses/Orbits: Negative. Other: None. IMPRESSION: 1. Motion degradation on multiple sequences. 2. 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 Possible additional punctate foci of reduced diffusion in the frontal lobes. 3. No acute hemorrhage or focal mass effect. 4. Mild chronic microvascular ischemic changes and parenchymal volume loss of the brain for age. These results were called by telephone at the time of interpretation on 10/12/2016 at 12:40 am to Dr. Gareth Morgan , who verbally acknowledged these results. Electronically Signed   By: Kristine Garbe M.D.   On: 10/12/2016 00:41    Lab Data:  CBC:  Recent Labs Lab 10/11/16 1948 10/12/16 0342  WBC 6.5 5.5  HGB 13.2 12.6*  HCT 38.1* 37.6*  MCV 88.8 89.3  PLT 127* 814*   Basic Metabolic Panel:  Recent Labs Lab 10/11/16 1948 10/12/16 0342  NA 133* 137  K 4.3 4.2  CL 103 107  CO2 23 24  GLUCOSE 141* 103*  BUN 16 16  CREATININE 0.90 0.91  CALCIUM 8.7* 8.7*  MG 2.3  --    GFR: Estimated Creatinine Clearance: 78 mL/min (by C-G formula based on SCr of 0.91 mg/dL). Liver Function Tests:  Recent Labs Lab 10/11/16 1948 10/12/16 0342  AST 23 18  ALT 16* 18  ALKPHOS 72 67  BILITOT 0.8 0.6  PROT 6.5 6.1*  ALBUMIN 3.8 3.6   No results for input(s): LIPASE, AMYLASE in the last 168 hours. No results for input(s): AMMONIA in the last 168 hours. Coagulation Profile: No results for input(s): INR, PROTIME in the last 168 hours. Cardiac Enzymes:  Recent Labs Lab 10/11/16 1948 10/12/16 0342  TROPONINI <0.03 <0.03    BNP (last 3 results) No results for input(s): PROBNP in the last 8760 hours. HbA1C:  Recent Labs  10/12/16 0342  HGBA1C 5.1   CBG:  Recent Labs Lab 10/11/16 1940 10/12/16 0639  GLUCAP 142* 92   Lipid Profile:  Recent Labs  10/12/16 0342  CHOL 134  HDL 21*  LDLCALC UNABLE  TO CALCULATE IF TRIGLYCERIDE OVER 400 mg/dL  TRIG 505*  CHOLHDL 6.4   Thyroid Function Tests:  Recent Labs  10/12/16 0342  TSH 2.786   Anemia Panel: No results for input(s): VITAMINB12, FOLATE, FERRITIN, TIBC, IRON, RETICCTPCT in the last 72 hours. Urine analysis:    Component Value Date/Time   COLORURINE YELLOW 10/11/2016 1945   APPEARANCEUR CLEAR 10/11/2016 1945   APPEARANCEUR Clear 11/24/2012 1910   LABSPEC 1.014 10/11/2016 1945   LABSPEC 1.017 11/24/2012 1910   PHURINE 5.0 10/11/2016 1945   GLUCOSEU NEGATIVE 10/11/2016 1945   GLUCOSEU Negative 11/24/2012 [place] 10/11/2016 1945   BILIRUBINUR NEGATIVE 10/11/2016 1945   BILIRUBINUR neg 12/05/2014 1720   BILIRUBINUR Negative 11/24/2012 [place] NEGATIVE 10/11/2016 1945   PROTEINUR NEGATIVE 10/11/2016 1945   UROBILINOGEN negative 12/05/2014 1720   UROBILINOGEN 1.0 07/11/2013 2253   NITRITE NEGATIVE 10/11/2016 1945   LEUKOCYTESUR NEGATIVE 10/11/2016 1945   LEUKOCYTESUR Negative 11/24/2012 1910     Eriel Dunckel M.D. [facility] 10/12/2016, 9:33 AM  Pager: [phone] Between 7am to 7pm - call Pager - [phone]  After 7pm go to [email or web address] - password [email or web address]  Call night coverage person covering after 7pm

## 2016-10-12 NOTE — Progress Notes (Signed)
STROKE TEAM PROGRESS NOTE   HISTORY OF PRESENT ILLNESS (per record) Colin Lopez is an 76 y.o. male white right-handed male with PMH of coronary artery disease status post stents into 4, hyperlipidemia, TIA who was out a restaurant with his wife when he had sudden onset dizziness and blurry vision.  He states that around p.m. he was with his wife and all of a sudden he felt very dizzy sensation of the room spinning, blurry vision, nausea and felt like he went to pass out. He had difficulty with balance and they decided to drive to the emergency room. Here I did [facility] ER around 7:30 PM and his symptoms were improving. He still feels a little ataxic on walking for remaining symptoms have resolved. He no longer complains of blurred vision, dizziness. He denies any tinnitus, slurred speech, double vision aphasia or confusion. His blood pressure on arrival was 117/65. He takes aspirin 81 mg and  Plavix 75 mg as he had recent cardiac stents placed 6 weeks ago  MRI performed the emergency room which demonstrated a splenium infarct. Patient was admitted for stroke workup neurology was consulted  Date last known well: 9.30.18 Time last known well: 6 PM tPA Given: No, not brought in as a stroke alert NIHSS: 0 currently Baseline MRS 0   SUBJECTIVE (INTERVAL HISTORY) His therapist is at the bedside.  He is ambulating well in hallway. He still has mild dizziness and imabalance   OBJECTIVE Temp:  [97.6 F (36.4 C)-98 F (36.7 C)] 97.8 F (36.6 C) (10/01 1037) Pulse Rate:  [34-74] 66 (10/01 1037) Cardiac Rhythm: Sinus bradycardia (10/01 0700) Resp:  [11-22] 18 (10/01 1037) BP: (113-195)/(59-99) 142/73 (10/01 1037) SpO2:  [93 %-98 %] 95 % (10/01 1037) Weight:  [209 lb (94.8 kg)] 209 lb (94.8 kg) (09/30 1943)  CBC:   Recent Labs Lab 10/11/16 1948 10/12/16 0342  WBC 6.5 5.5  HGB 13.2 12.6*  HCT 38.1* 37.6*  MCV 88.8 89.3  PLT 127* 106*    Basic Metabolic Panel:   Recent Labs Lab  10/11/16 1948 10/12/16 0342  NA 133* 137  K 4.3 4.2  CL 103 107  CO2 23 24  GLUCOSE 141* 103*  BUN 16 16  CREATININE 0.90 0.91  CALCIUM 8.7* 8.7*  MG 2.3  --     Lipid Panel:     Component Value Date/Time   CHOL 134 10/12/2016 0342   CHOL 202 (H) 09/03/2016 0808   TRIG 505 (H) 10/12/2016 0342   HDL 21 (L) 10/12/2016 0342   HDL 30 (L) 09/03/2016 0808   CHOLHDL 6.4 10/12/2016 0342   VLDL UNABLE TO CALCULATE IF TRIGLYCERIDE OVER 400 mg/dL 10/12/2016 0342   LDLCALC UNABLE TO CALCULATE IF TRIGLYCERIDE OVER 400 mg/dL 10/12/2016 0342   LDLCALC 99 09/03/2016 0808   HgbA1c:  Lab Results  Component Value Date   HGBA1C 5.1 10/12/2016   Urine Drug Screen: No results found for: LABOPIA, COCAINSCRNUR, LABBENZ, AMPHETMU, THCU, LABBARB  Alcohol Level No results found for: ETH  IMAGING  Ct Angio Head W Or Wo Contrast Ct Angio Neck W Or Wo Contrast 10/12/2016 IMPRESSION:  Advanced atherosclerosis of the aorta. Calcified plaque at both carotid bifurcations but without stenosis or ulceration. Atherosclerotic calcification in both carotid siphon regions and supraclinoid internal carotid arteries. Maximal stenosis estimated at 30-50% in the supraclinoid regions bilaterally. No intracranial branch vessel disease identified.    Mr Brain Wo Contrast 10/12/2016 IMPRESSION:  1. Motion degradation on multiple sequences.  2.  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 Possible additional punctate foci of reduced diffusion in the frontal lobes.  3. No acute hemorrhage or focal mass effect.  4. Mild chronic microvascular ischemic changes and parenchymal volume loss of the brain for age.    Ct Head Wo Contrast 10/11/2016 IMPRESSION:  No acute intracranial abnormalities.   Dg Chest 2 View 10/11/2016 IMPRESSION:  No acute pulmonary process identified.      Transthoracic Echocardiogram - 09/16/16 was normal   Bilateral Carotid  Dopplers - Bilateral 1-39% ICA stenosis, antegrade vertebral flow.     PHYSICAL EXAM Vitals:   10/12/16 0530 10/12/16 0730 10/12/16 0930 10/12/16 1037  BP: (!) 162/59 (!) 163/76 (!) 154/74 (!) 142/73  Pulse: (!) 54 63 74 66  Resp: 14 16 16 18   Temp: 97.8 F (36.6 C) 97.9 F (36.6 C) 98 F (36.7 C) 97.8 F (36.6 C)  TempSrc: Oral Oral Oral Oral  SpO2: 97% 98% 97% 95%  Weight:      Height:       Obese elderly Caucasian male not in distress. . Afebrile. Head is nontraumatic. Neck is supple without bruit.    Cardiac exam no murmur or gallop. Lungs are clear to auscultation. Distal pulses are well felt. Neurological Exam ;  Awake  Alert oriented x 3. Normal speech and language.eye movements full without nystagmus.fundi were not visualized. Vision acuity and fields appear normal. Hearing is normal. Palatal movements are normal. Face symmetric. Tongue midline. Normal strength, tone, reflexes and coordination. Normal sensation. Gait deferred.  ASSESSMENT/PLAN [name] is a 76 y.o. male with history of previous TIA, coronary artery disease with previous MI and stents, hypertension, hyperlipidemia, and hx of exposure to TB presenting with sudden onset of dizziness, blurred vision, nausea, unsteady gait, and presyncope. He did not receive IV t-PA due to resolution of deficits.  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   Resultant  Mild dizziness but no objective deficits  CT head - No acute intracranial abnormalities.   MRI head - Subcentimeter acute/early subacute infarction within right forceps of splenium of corpus callosum. Possible additional punctate foci of reduced diffusion in the frontal lobes.   MRA head - not performed  CTA H&N - No high-grade stenosis  Carotid Doppler -Bilateral 1-39% ICA stenosis, antegrade vertebral flow.  2D Echo - 09/16/16 was normal  LDL - unable to calculate secondary to  elevated triglycerides of 505  HgbA1c - 5.1  VTE prophylaxis - SCDs Diet heart healthy/carb modified Room service appropriate? Yes; Fluid consistency: Thin  aspirin 81 mg daily and clopidogrel 75 mg daily prior to admission, now on aspirin 325 mg daily and clopidogrel 75 mg daily  Patient counseled to be compliant with his antithrombotic medications  Ongoing aggressive stroke risk factor management  Therapy recommendations: pending  Disposition: Pending  Hypertension  Stable  Permissive hypertension (OK if < 220/120) but gradually normalize in 5-7 days  Long-term BP goal normotensive  Hyperlipidemia  Home meds: Crestor 5 mg daily prior to admission  LDL unable to calculate, goal < 70  Now on Lipitor 40 mg daily and fenofibrate 160 mg daily  Continue statin at discharge   Other Stroke Risk Factors  Advanced age  ETOH use, advised to drink no more than 1 drink per day.  Overweight, Body mass index is 27.57 kg/m., recommend weight loss, diet and exercise as appropriate  Hx stroke/TIA  Coronary artery disease  Other Active Problems  Mild anemia - 12.6 / 37.6  Thrombocytopenia - 106  Hypertriglyceridemia - 505 - fenofibrate started  Hospital day # 0  I have personally examined this patient, reviewed notes, independently viewed imaging studies, participated in medical decision making and plan of care.ROS completed by me personally and pertinent positives fully documented  I have made any additions or clarifications directly to the above note. He presented with transient episode of dizziness, vertigo and blurred vision likely due to posterior circulation TIA and MRI does show small infarct in the splenium of corpus callosum likely from small vessel disease. Recommend dual antiplatelet therapy for  recent cardiac stent and aggressive risk factor modification. Patient has history of statin intolerance. Given elevated triglycerides recommend start fenofibrate and if not  effective consider the new PCS K9 inhibitor injections as an outpatient. Greater than 50% time during this 35 minute visit was spent on counseling and coordination of care about his stroke, discussion about evaluation, prevention and treatment and answered questions. Discussed with Dr. Vernell Morgans, MD Medical Director [place] Pager: [phone] 10/12/2016 2:56 PM   To contact Stroke Continuity provider, please refer to [email or web address] After hours, contact General Neurology

## 2016-10-12 NOTE — Care Management Note (Signed)
Case Management Note  Patient Details  Name: Colin Lopez MRN: [id number] Date of Birth: 09-05-1940  Subjective/Objective:    Pt admitted with CVA. He is from home with his spouse.                 Action/Plan: No f/u per PT/OT. CM following for further d/c needs, physician orders.   Expected Discharge Date:                  Expected Discharge Plan:  Home/Self Care  In-House Referral:     Discharge planning Services     Post Acute Care Choice:    Choice offered to:     DME Arranged:    DME Agency:     HH Arranged:    HH Agency:     Status of Service:  In process, will continue to follow  If discussed at Long Length of Stay Meetings, dates discussed:    Additional Comments:  [name], RN 10/12/2016, 12:17 PM

## 2016-10-12 NOTE — Progress Notes (Signed)
Physical Therapy Evaluation Patient Details Name: Colin Lopez MRN: [id number] DOB: August 29, 1940 Today's Date: 10/12/2016   History of Present Illness  Patient is a 76 y.o. male who presented to ED with c/o of blurred vision, dizziness, and HA. MRI revealed subcentimer acute/early subacute infarction of R splenium of corpus callosum and possible punctate foci in the frontal lobes.   Clinical Impression  Patient evaluated and findings are listed below. Prior to admission patient was independent with all mobility and self-care activities. At d/c will have appropriate caregiver assistance at home. Patient and his family were educated on B.E. F.A.S.T. At this time no further acute PT services are recommended.     Follow Up Recommendations No PT follow up    Equipment Recommendations  None recommended by PT    Recommendations for Other Services       Precautions / Restrictions Precautions Precautions: Fall (Simultaneous filing. User may not have seen previous data.) Restrictions Weight Bearing Restrictions: No      Mobility  Bed Mobility Overal bed mobility: Modified Independent             General bed mobility comments: pt utilized bed rails to pull self higher in bed  Transfers Overall transfer level: Needs assistance Equipment used: None Transfers: Sit to/from Stand Sit to Stand: Supervision             Ambulation/Gait Ambulation/Gait assistance: Supervision Ambulation Distance (Feet): 210 Feet Assistive device: None Gait Pattern/deviations: Step-through pattern;Decreased stride length Gait velocity: decreased   General Gait Details: slow but steady; no significant instability noted. Pt able to change gait speeds on cue with no overt LOB. Supervision for safety.  Stairs Stairs: Yes Stairs assistance: Supervision Stair Management: One rail Left;Step to pattern Number of Stairs: 3 General stair comments: pt connected to IV and unable to go farther than 3  steps; no instability noted  Wheelchair Mobility    Modified Rankin (Stroke Patients Only) Modified Rankin (Stroke Patients Only) Pre-Morbid Rankin Score: No symptoms Modified Rankin: No symptoms     Balance Overall balance assessment: Needs assistance Sitting-balance support: Feet supported Sitting balance-Leahy Scale: Good     Standing balance support: During functional activity Standing balance-Leahy Scale: Good               High level balance activites: Head turns;Turns;Sudden stops High Level Balance Comments: able to tolerate high level balance challenges without LOB or instability. Mild decrease in gait speed with head turns.             Pertinent Vitals/Pain Pain Assessment: No/denies pain    Home Living Family/patient expects to be discharged to:: Private residence Living Arrangements: Spouse/significant other Available Help at Discharge: Family Type of Home: House Home Access: Stairs to enter Entrance Stairs-Rails: Left Entrance Stairs-Number of Steps: 8 Home Layout: Two level;Able to live on main level with bedroom/bathroom Home Equipment: None      Prior Function Level of Independence: Independent               Hand Dominance   Dominant Hand: Right    Extremity/Trunk Assessment   Upper Extremity Assessment Upper Extremity Assessment: Defer to OT evaluation    Lower Extremity Assessment Lower Extremity Assessment: RLE deficits/detail;LLE deficits/detail RLE Deficits / Details: DF 4-/5 LLE Deficits / Details: DF 4-/5       Communication   Communication: No difficulties  Cognition Arousal/Alertness: Awake/alert Behavior During Therapy: WFL for tasks assessed/performed Overall Cognitive Status: Within Functional Limits for tasks assessed  General Comments General comments (skin integrity, edema, etc.): pt and his family educated on B.E. F.A.S.T.    Exercises      Assessment/Plan    PT Assessment Patent does not need any further PT services  PT Problem List         PT Treatment Interventions      PT Goals (Current goals can be found in the Care Plan section)  Acute Rehab PT Goals Patient Stated Goal: to go home PT Goal Formulation: With patient Time For Goal Achievement: 10/26/16 Potential to Achieve Goals: Good    Frequency     Barriers to discharge        Co-evaluation               AM-PAC PT "6 Clicks" Daily Activity  Outcome Measure Difficulty turning over in bed (including adjusting bedclothes, sheets and blankets)?: A Little Difficulty moving from lying on back to sitting on the side of the bed? : None Difficulty sitting down on and standing up from a chair with arms (e.g., wheelchair, bedside commode, etc,.)?: None Help needed moving to and from a bed to chair (including a wheelchair)?: None Help needed walking in hospital room?: None Help needed climbing 3-5 steps with a railing? : A Little 6 Click Score: 22    End of Session Equipment Utilized During Treatment: Gait belt Activity Tolerance: Patient tolerated treatment well Patient left: in bed;with call bell/phone within reach;with family/visitor present;with SCD's reapplied Nurse Communication: Mobility status PT Visit Diagnosis: Unsteadiness on feet (R26.81)    Time: 0881-1031 PT Time Calculation (min) (ACUTE ONLY): 18 min   Charges:   PT Evaluation $PT Eval Low Complexity: 1 Low     PT G CodesSindy Guadeloupe, SPT [phone] office   Margarita Grizzle 10/12/2016, 9:08 AM

## 2016-10-12 NOTE — Evaluation (Signed)
Speech Language Pathology Evaluation Patient Details Name: Colin Lopez MRN: [id number] DOB: 07-20-1940 Today's Date: 10/12/2016 Time: 1330-1350 SLP Time Calculation (min) (ACUTE ONLY): 20 min  Problem List:  Patient Active Problem List   Diagnosis Date Noted  . Stroke (cerebrum) ([place]) 10/12/2016  . CVA (cerebral vascular accident) ([place]) 10/12/2016  . Angina pectoris ([place]) 09/09/2016  . Abnormal nuclear stress test 09/09/2016  . Right hand pain 07/24/2016  . Ulnar fracture 07/24/2016  . Pulmonary nodule, left 05/20/2016  . Axillary lymphadenopathy 05/05/2016  . 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 Hemoptysis 04/20/2016  . Insomnia 12/17/2014  . Neuropathic pain of right foot 12/17/2014  . Coronary disease   . Dyslipidemia   . Essential hypertension    Past Medical History:  Past Medical History:  Diagnosis Date  . CAD (coronary artery disease), native coronary artery    09/09/16 PCI/DES x3 to mRCA, OM1 and dLcx/OM2.   . Coronary disease    Status post stenting of the left circumflex coronary in 2009 with a bare-metal stent (with a 3.5x15mm Liberte stent)  . Dyslipidemia   . 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 Headache   . Hyperlipidemia   . Hypertension   . NSTEMI (non-ST elevated myocardial infarction) ([place]) 2009   BMS CFX  . TIA (transient ischemic attack)    history of tia   Past Surgical History:  Past Surgical History:  Procedure Laterality Date  . CARDIOVASCULAR STRESS TEST  10-03-08   EF 59%  . CORONARY ANGIOPLASTY WITH STENT PLACEMENT  09/09/2016  . CORONARY STENT INTERVENTION N/A 09/09/2016   Procedure: CORONARY STENT INTERVENTION;  Surgeon: [name], [name] M, MD;  Location: [facility];  Service: Cardiovascular;  Laterality: N/A;  . LEFT HEART CATH AND CORONARY ANGIOGRAPHY N/A 09/09/2016   Procedure: LEFT HEART CATH AND CORONARY ANGIOGRAPHY;  Surgeon: [name], [name] M, MD;  Location: [facility];  Service: Cardiovascular;   Laterality: N/A;  . US ECHOCARDIOGRAPHY  09-21-08   EF 55-60%   HPI:  Colin Lopez a 76 y.o.male,w hypertension, hyperlipidemia, CAD s/p stent, TIA apparently presents with c/o eating supper and felt dizzy and everything was moving and felt generally weak and had slight headache. + vertigo. Slight hearing loss out of left ear, no tinnitus. MRI revealed subcentimeter acute/early subacute infarction within right forceps of splenium of corpus callosum. Possible additional punctate foci of reduced diffusion in the frontal lobes, no acute hemorrhage or focal mass effect, and mild chronic microvascular ischemic changes and parenchymal volume loss of the brain for age.   Assessment / Plan / Recommendation Clinical Impression  Pt presents with functional cognitive linguistic abilities and doesn't require any follow up ST services.     SLP Assessment  SLP Recommendation/Assessment: Patient does not need any further Speech Lanaguage Pathology Services SLP Visit Diagnosis: Cognitive communication deficit (R41.841)    Follow Up Recommendations  None    Frequency and Duration           SLP Evaluation Cognition  Overall Cognitive Status: Within Functional Limits for tasks assessed Arousal/Alertness: Awake/alert Orientation Level: Oriented X4 Attention: Selective Selective Attention: Appears intact Memory: Appears intact Awareness: Appears intact Problem Solving: Appears intact Safety/Judgment: Appears intact       Comprehension  Auditory Comprehension Overall Auditory Comprehension: Appears within functional limits for tasks assessed Yes/No Questions: Within Functional Limits Commands: Within Functional Limits Conversation: Simple    Expression Expression Primary Mode of Expression: Verbal Verbal  Expression Overall Verbal Expression: Appears within functional limits for tasks assessed Initiation: No impairment Automatic Speech: Name;Social Response;Day of week;Month of  year Level of Generative/Spontaneous Verbalization: Conversation Repetition: No impairment Naming: No impairment Pragmatics: No impairment Written Expression Dominant Hand: Right Written Expression: Within Functional Limits   Oral / Motor  Oral Motor/Sensory Function Overall Oral Motor/Sensory Function: Within functional limits Motor Speech Overall Motor Speech: Appears within functional limits for tasks assessed Respiration: Within functional limits Phonation: Normal Resonance: Within functional limits Articulation: Within functional limitis Intelligibility: Intelligible Motor Planning: Witnin functional limits Motor Speech Errors: Not applicable   GO                    Rashonda Warrior 10/12/2016, 2:58 PM

## 2016-10-12 NOTE — Progress Notes (Signed)
CTA complete.  Pt to vascular for carotids at this time.

## 2016-10-12 NOTE — Care Management Note (Signed)
Case Management Note  Patient Details  Name: Colin Lopez MRN: [id number] Date of Birth: [birth year]  Subjective/Objective:                    Action/Plan: Pt discharging home with self care. Pt has PCP, insurance and transportation home. No further needs per CM.   Expected Discharge Date:  10/12/16               Expected Discharge Plan:  Home/Self Care  In-House Referral:     Discharge planning Services     Post Acute Care Choice:    Choice offered to:     DME Arranged:    DME Agency:     HH Arranged:    HH Agency:     Status of Service:  Completed, signed off  If discussed at [employer] of Stay Meetings, dates discussed:    Additional Comments:  [name], RN 10/12/2016, 3:18 PM

## 2016-10-12 NOTE — Discharge Summary (Signed)
Physician Discharge Summary   Patient ID: TAMEL ABEL MRN: [id number] DOB/AGE: [birth year] 77 y.o.  Admit date: 10/11/2016 Discharge date: 10/12/2016  Primary Care Physician:  Jearld Fenton, NP  Discharge Diagnoses:    . Dyslipidemia . Essential hypertension . CVA (cerebral vascular accident) [facility])   Consults: neurology  Recommendations for Outpatient Follow-up:  1. PT eval stated no PT follow up  2. Please repeat CBC/BMET at next visit 3. Patient started on fenofibrate and lipitor 40mg  daily    DIET: heart healthy     Allergies:   Allergies  Allergen Reactions  . Penicillins Hives    Has patient had a PCN reaction causing immediate rash, facial/tongue/throat swelling, SOB or lightheadedness with hypotension: No Has patient had a PCN reaction causing severe rash involving mucus membranes or skin necrosis: Yes Has patient had a PCN reaction that required hospitalization: No Has patient had a PCN reaction occurring within the last 10 years: No If all of the above answers are "NO", then may proceed with Cephalosporin use.   [name] Doxycycline Rash    Water blisters  . Levaquin [Levofloxacin In D5w] Rash     DISCHARGE MEDICATIONS: Current Discharge Medication List    START taking these medications   Details  atorvastatin (LIPITOR) 40 MG tablet Take 1 tablet (40 mg total) by mouth at bedtime. Qty: 30 tablet, Refills: 4    fenofibrate 160 MG tablet Take 1 tablet (160 mg total) by mouth daily. Qty: 30 tablet, Refills: 3      CONTINUE these medications which have NOT CHANGED   Details  acetaminophen (TYLENOL) 500 MG tablet Take 1,000 mg by mouth every 6 (six) hours as needed for headache (pain).    aspirin EC 81 MG tablet Take 1 tablet (81 mg total) by mouth daily. Qty: 90 tablet, Refills: 3    clopidogrel (PLAVIX) 75 MG tablet Take 1 tablet (75 mg total) by mouth daily with breakfast. Qty: 90 tablet, Refills: 2    Krill Oil 1000 MG CAPS Take 1,000 mg by mouth  at bedtime.    lisinopril (PRINIVIL,ZESTRIL) 10 MG tablet Take 1 tablet (10 mg total) by mouth daily. Qty: 90 tablet, Refills: 0    nebivolol (BYSTOLIC) 10 MG tablet Take 1 tablet (10 mg total) by mouth daily. Qty: 90 tablet, Refills: 3    nitroGLYCERIN (NITROSTAT) 0.4 MG SL tablet Place 1 tablet (0.4 mg total) under the tongue every 5 (five) minutes as needed for chest pain. Qty: 25 tablet, Refills: 3    OVER THE COUNTER MEDICATION Apply 1 application topically daily as needed (pain). Horse Liniment otc cream    pantoprazole (PROTONIX) 40 MG tablet Take 1 tablet (40 mg total) by mouth daily. Qty: 30 tablet, Refills: 11      STOP taking these medications     rosuvastatin (CRESTOR) 5 MG tablet          Brief H and P: For complete details please refer to admission H and P, but in brief WilliamHuntis a 76 y.o.male,w hypertension, hyperlipidemia, CAD s/p stent, TIA apparently presents with c/o eating supper and felt dizzy and everything was moving and felt generally weak and had slight headache. + vertigo. Slight hearing loss out of left ear, no tinnitus. MRI of the brain showed acute/respiratory subacute infarction the right forceps of splenium of corpus callosum, punctate foci of reduced diffusion in the frontal lobes  Hospital Course:  CVA (cerebral vascular accident) ([place]) - presented with headache, blurred vision, generalized weakness, dizziness -  MRI of the brain showed acute/respiratory subacute infarction the right forceps of splenium of corpus callosum, punctate foci of reduced diffusion in the frontal lobes - CT angiogram of the head and neck showed both ICA widely patent, atherosclerotic disease of the supraclinoid internal carotid arteries. No intracranial branch vessel disease.  - recent 2-D echo on 09/16/16 showed EF of 55-60% with grade 1 diastolic dysfunction, no wall motion abnormalities. Recent cardiac cath showed severe two-vessel obstructive CAD, successful  stenting of mid RCA, OM 1, distal LCx, recommended DAPT for at least 1 year -  Neurology consulted, continue aspirin, Plavix. Discussed with Dr Leonie Man, recommended continue  ASA 81mg  and plavix.  - LDL showed cholesterol 134, LDL unable to calculate, triglycerides 505, placed on Lipitor, fenofibrate, need to follow closely. Lipid panel in 1 month, or recommend PCSK9 inhibitors  - Hemoglobin A1c 5.1 - PT evaluation recommended no PT follow-up - carotid dopplers 1-39% ICA stenosis       Dyslipidemia - LDL showed cholesterol 134, LDL unable to calculate, triglycerides 505, placed on Lipitor, fenofibrate - need to follow closely. Lipid panel in 1 month, or recommend PCSK9 inhibitors      Essential hypertension - on lisinopril, bystolic   Day of Discharge BP (!) 142/73 (BP Location: Right Arm)   Pulse 66   Temp 97.8 F (36.6 C) (Oral)   Resp 18   Ht 6\' 1"  (1.854 m)   Wt 94.8 kg (209 lb)   SpO2 95%   BMI 27.57 kg/m   Physical Exam: General: Alert and awake oriented x3 not in any acute distress. HEENT: anicteric sclera, pupils reactive to light and accommodation CVS: S1-S2 clear no murmur rubs or gallops Chest: clear to auscultation bilaterally, no wheezing rales or rhonchi Abdomen: soft nontender, nondistended, normal bowel sounds Extremities: no cyanosis, clubbing or edema noted bilaterally Neuro: Cranial nerves II-XII intact, no focal neurological deficits   The results of significant diagnostics from this hospitalization (including imaging, microbiology, ancillary and laboratory) are listed below for reference.    LAB RESULTS: Basic Metabolic Panel:  Recent Labs Lab 10/11/16 1948 10/12/16 0342  NA 133* 137  K 4.3 4.2  CL 103 107  CO2 23 24  GLUCOSE 141* 103*  BUN 16 16  CREATININE 0.90 0.91  CALCIUM 8.7* 8.7*  MG 2.3  --    Liver Function Tests:  Recent Labs Lab 10/11/16 1948 10/12/16 0342  AST 23 18  ALT 16* 18  ALKPHOS 72 67  BILITOT 0.8 0.6   PROT 6.5 6.1*  ALBUMIN 3.8 3.6   No results for input(s): LIPASE, AMYLASE in the last 168 hours. No results for input(s): AMMONIA in the last 168 hours. CBC:  Recent Labs Lab 10/11/16 1948 10/12/16 0342  WBC 6.5 5.5  HGB 13.2 12.6*  HCT 38.1* 37.6*  MCV 88.8 89.3  PLT 127* 106*   Cardiac Enzymes:  Recent Labs Lab 10/12/16 0342 10/12/16 0922  TROPONINI <0.03 <0.03   BNP: Invalid input(s): POCBNP CBG:  Recent Labs Lab 10/11/16 1940 10/12/16 0639  GLUCAP 142* 92    Significant Diagnostic Studies:  Ct Angio Head W Or Wo Contrast  Result Date: 10/12/2016 CLINICAL DATA:  Followup stroke. Dizziness. Blurred vision. Frontal headache. EXAM: CT ANGIOGRAPHY HEAD AND NECK TECHNIQUE: Multidetector CT imaging of the head and neck was performed using the standard protocol during bolus administration of intravenous contrast. Multiplanar CT image reconstructions and MIPs were obtained to evaluate the vascular anatomy. Carotid stenosis measurements (when applicable) are obtained  utilizing NASCET criteria, using the distal internal carotid diameter as the denominator. CONTRAST:  50 cc Isovue 370 COMPARISON:  MRI 10/11/2016.  CT 10/11/2016. FINDINGS: CT HEAD FINDINGS Brain: Mild generalized atrophy. Chronic appearing areas of low-density in the hemispheric deep white matter. No CT evidence of acute infarction, mass lesion, hemorrhage, hydrocephalus or extra-axial collection. Vascular: There is atherosclerotic calcification of the major vessels at the base of the brain. 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 No sign of aneurysm or dissection. Branching pattern of the brachiocephalic vessels from the arch is normal without origin stenosis. Right carotid system: Common carotid artery is tortuous but widely patent to the bifurcation region. There is a small  amount of calcified plaque at the carotid bifurcation but no ICA stenosis. Cervical ICA is tortuous but widely patent. Left carotid system: Common carotid artery is widely patent to the bifurcation region. Calcific plaque at the carotid bifurcation and proximal ICA. Minimal diameter of the proximal ICA is 5 mm. Therefore, there is no stenosis. Cervical ICA beyond that is tortuous but widely patent. Vertebral arteries: Both vertebral artery origins are widely patent. Left vertebral artery is dominant. Both vertebral arteries are widely patent through the cervical region to the foramen magnum. Skeleton: Advanced chronic spondylosis. Other neck: No soft tissue lesion. Upper chest: Mild scarring.  No active process. Review of the MIP images confirms the above findings CTA HEAD FINDINGS Anterior circulation: Both internal carotid arteries are widely patent through the skullbase. The siphon regions show extensive peripheral atherosclerotic calcification but no stenosis greater than 30%. There is atherosclerotic disease of the supraclinoid internal carotid arteries. Stenosis is estimated at 30-50% on both sides. Anterior and middle cerebral vessels are patent without proximal stenosis, aneurysm or vascular malformation. Posterior circulation: Both vertebral arteries are widely patent through the foramen magnum to the basilar. Posterior circulation branch vessels appear normal. No basilar stenosis. Venous sinuses: Patent and normal. Anatomic variants: None significant Delayed phase: No abnormal enhancement Review of the MIP images confirms the above findings IMPRESSION: Advanced atherosclerosis of the aorta. Calcified plaque at both carotid bifurcations but without stenosis or ulceration. Atherosclerotic calcification in both carotid siphon regions and supraclinoid internal carotid arteries. Maximal stenosis estimated at 30-50% in the supraclinoid regions bilaterally. No intracranial branch vessel disease identified.  Electronically Signed   By: Nelson Chimes M.D.   On: 10/12/2016 10:42   Dg Chest 2 View  Result Date: 10/11/2016 CLINICAL DATA:  76 y/o M; recent cardiac stent placement. Presenting with dizziness, blurred vision, presyncope. EXAM: CHEST  2 VIEW COMPARISON:  09/30/2016 chest radiograph.  06/30/2016 PET-CT. FINDINGS: Normal cardiac silhouette. Aortic atherosclerosis with calcification. Stable nodule within the left lower lobe. No consolidation. No pleural effusion or pneumothorax. Multilevel degenerative changes of the thoracic spine. IMPRESSION: No acute pulmonary process identified. Electronically Signed   By: Kristine Garbe M.D.   On: 10/11/2016 23:18   Ct Head Wo Contrast  Result Date: 10/11/2016 CLINICAL DATA:  Headache.  Chronic neuro deficit. EXAM: CT HEAD WITHOUT CONTRAST TECHNIQUE: Contiguous axial images were obtained from the base of the skull through the vertex without intravenous contrast. COMPARISON:  None. FINDINGS: Brain: No evidence of acute infarction, hemorrhage, hydrocephalus, extra-axial collection or mass lesion/mass effect. Vascular: No hyperdense vessel or unexpected calcification. Skull: Normal. Negative for fracture or focal lesion. Sinuses/Orbits: No acute finding. Other: None. IMPRESSION: 1. No acute intracranial abnormalities.  Electronically Signed   By: Kerby Moors M.D.   On: 10/11/2016 20:40   Ct Angio Neck W Or Wo Contrast  Result Date: 10/12/2016 CLINICAL DATA:  Followup stroke. Dizziness. Blurred vision. Frontal headache. EXAM: CT ANGIOGRAPHY HEAD AND NECK TECHNIQUE: Multidetector CT imaging of the head and neck was performed using the standard protocol during bolus administration of intravenous contrast. Multiplanar CT image reconstructions and MIPs were obtained to evaluate the vascular anatomy. Carotid stenosis measurements (when applicable) are obtained utilizing NASCET criteria, using the distal internal carotid diameter as the denominator. CONTRAST:  50  cc Isovue 370 COMPARISON:  MRI 10/11/2016.  CT 10/11/2016. FINDINGS: CT HEAD FINDINGS Brain: Mild generalized atrophy. Chronic appearing areas of low-density in the hemispheric deep white matter. No CT evidence of acute infarction, mass lesion, hemorrhage, hydrocephalus or extra-axial collection. Vascular: There is atherosclerotic calcification of the major vessels at the base of the brain. 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 No sign of aneurysm or dissection. Branching pattern of the brachiocephalic vessels from the arch is normal without origin stenosis. Right carotid system: Common carotid artery is tortuous but widely patent to the bifurcation region. There is a small amount of calcified plaque at the carotid bifurcation but no ICA stenosis. Cervical ICA is tortuous but widely patent. Left carotid system: Common carotid artery is widely patent to the bifurcation region. Calcific plaque at the carotid bifurcation and proximal ICA. Minimal diameter of the proximal ICA is 5 mm. Therefore, there is no stenosis. Cervical ICA beyond that is tortuous but widely patent. Vertebral arteries: Both vertebral artery origins are widely patent. Left vertebral artery is dominant. Both vertebral arteries are widely patent through the cervical region to the foramen magnum. Skeleton: Advanced chronic spondylosis. Other neck: No soft tissue lesion. Upper chest: Mild scarring.  No active process. Review of the MIP images confirms the above findings CTA HEAD FINDINGS Anterior circulation: Both internal carotid arteries are widely patent through the skullbase. The siphon regions show extensive peripheral atherosclerotic calcification but no stenosis greater than 30%. There is atherosclerotic disease of the supraclinoid internal carotid arteries. Stenosis is estimated at 30-50% on both  sides. Anterior and middle cerebral vessels are patent without proximal stenosis, aneurysm or vascular malformation. Posterior circulation: Both vertebral arteries are widely patent through the foramen magnum to the basilar. Posterior circulation branch vessels appear normal. No basilar stenosis. Venous sinuses: Patent and normal. Anatomic variants: None significant Delayed phase: No abnormal enhancement Review of the MIP images confirms the above findings IMPRESSION: Advanced atherosclerosis of the aorta. Calcified plaque at both carotid bifurcations but without stenosis or ulceration. Atherosclerotic calcification in both carotid siphon regions and supraclinoid internal carotid arteries. Maximal stenosis estimated at 30-50% in the supraclinoid regions bilaterally. No intracranial branch vessel disease identified. Electronically Signed   By: Nelson Chimes M.D.   On: 10/12/2016 10:42   Mr Brain Wo Contrast  Result Date: 10/12/2016 CLINICAL DATA:  76 y/o M; dizziness, blurred vision, presyncopal feeling, frontal headache. EXAM: MRI HEAD WITHOUT CONTRAST TECHNIQUE: Multiplanar, multiecho pulse sequences of the brain and surrounding structures were obtained without intravenous contrast. COMPARISON:  10/11/2016 CT head FINDINGS: Brain: Subcentimeter focus of reduced diffusion within the right forceps of splenium of corpus callosum compatible with acute/early subacute infarction. Few additional possible punctate foci of diffusion signal abnormality in the frontal lobes (series 3, image 31, 38, 42). No  abnormal susceptibility hypointensity to indicate intracranial hemorrhage. Motion degraded T2 and T2 FLAIR weighted sequences. Background of mild chronic microvascular ischemic changes and parenchymal volume loss of the brain for age. No focal mass effect, hydrocephalus, extra-axial collection, or effacement of basilar cisterns. Vascular: Normal flow voids. Skull and upper cervical spine: Normal marrow signal.  Sinuses/Orbits: Negative. Other: None. IMPRESSION: 1. Motion degradation on multiple sequences. 2. 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 Possible additional punctate foci of reduced diffusion in the frontal lobes. 3. No acute hemorrhage or focal mass effect. 4. Mild chronic microvascular ischemic changes and parenchymal volume loss of the brain for age. These results were called by telephone at the time of interpretation on 10/12/2016 at 12:40 am to Dr. Gareth Morgan , who verbally acknowledged these results. Electronically Signed   By: Kristine Garbe M.D.   On: 10/12/2016 00:41    2D ECHO: 09/16/16 Study Conclusions  - Left ventricle: The cavity size was normal. Wall thickness was   increased in a pattern of severe LVH. Systolic function was   normal. The estimated ejection fraction was in the range of 55%   to 60%. Wall motion was normal; there were no regional wall   motion abnormalities. Doppler parameters are consistent with   abnormal left ventricular relaxation (grade 1 diastolic   dysfunction). - Aortic valve: Trileaflet; moderately calcified leaflets.   Sclerosis without stenosis. - Aorta: Mildly dilated aortic root. Aortic root dimension: 38 mm   (ED). - Mitral valve: Mildly calcified annulus. There was no significant   regurgitation. - Right ventricle: The cavity size was normal. Systolic function   was normal. - Tricuspid valve: Peak RV-RA gradient (S): 22 mm Hg. - Pulmonary arteries: PA peak pressure: 25 mm Hg (S). - Inferior vena cava: The vessel was normal in size. The   respirophasic diameter changes were in the normal range (= 50%),   consistent with normal central venous pressure.  Disposition and Follow-up: Discharge Instructions    Ambulatory referral to Neurology    Complete by:  As directed    An appointment is requested in approximately:4-6 Week(s):stroke   Diet - low sodium heart healthy     Complete by:  As directed    Increase activity slowly    Complete by:  As directed        DISPOSITION: home    [place]    Jearld Fenton, NP. Schedule an appointment as soon as possible for a visit in 2 week(s).   Specialty:  Internal Medicine Contact information: [place] [address] [phone]        Garvin Fila, MD. Schedule an appointment as soon as possible for a visit in 4 week(s).   Specialties:  Neurology, Radiology Contact information: [address] [place] [address] [phone]            Time spent on Discharge: 9mins   Signed:   Estill Cotta M.D. [facility] 10/12/2016, 2:18 PM Pager: [phone]

## 2016-10-12 NOTE — Progress Notes (Signed)
OT Note - Addendum    10/12/16 1200  OT Visit Information  Last OT Received On 10/12/16  OT Evaluation  $OT Eval Low Complexity 1 Low  OT Treatments  $Self Care/Home Management  8-22 mins  [facility], OT/L  [phone] 10/12/2016

## 2016-10-13 ENCOUNTER — Other Ambulatory Visit: Payer: Self-pay | Admitting: *Deleted

## 2016-10-13 NOTE — Telephone Encounter (Signed)
Lm requesting return call to complete TCM and confirm hosp f/u appt  

## 2016-10-14 NOTE — Telephone Encounter (Signed)
Lm requesting return call to complete TCM and confirm hosp f/u appt  

## 2016-10-19 ENCOUNTER — Encounter: Payer: Medicare Other | Attending: Cardiology | Admitting: *Deleted

## 2016-10-19 ENCOUNTER — Encounter: Payer: Self-pay | Admitting: *Deleted

## 2016-10-19 VITALS — Ht 69.6 in | Wt 206.0 lb

## 2016-10-19 DIAGNOSIS — I2510 Atherosclerotic heart disease of native coronary artery without angina pectoris: Secondary | ICD-10-CM | POA: Diagnosis not present

## 2016-10-19 DIAGNOSIS — Z955 Presence of coronary angioplasty implant and graft: Secondary | ICD-10-CM

## 2016-10-19 NOTE — Progress Notes (Signed)
Daily Session Note  Patient Details  Name: Colin Lopez MRN: [id number] Date of Birth: April 28, 1940 Referring Provider:     Cardiac Rehab from 10/19/2016 in [facility]  Referring Provider  [name], [name] MD      Encounter Date: 10/19/2016  Check In:     Session Check In - 10/19/16 1214      Check-In   Location [facility]Cardiac & Pulmonary Re[facility]   Staff Present Alberteen Sam, MA, ACSM RCEP, Exercise Physiologist;Meredith Sherryll Burger, RN BSN   Supervising physician immediately available to respond to emergencies See telemetry face sheet for immediately available ER MD   Medication changes reported     No   Fall or balance concerns reported    No   Warm-up and Cool-down Performed as group-led instruction   Resistance Training Performed Yes   VAD Patient? No     Pain Assessment   Currently in Pain? No/denies           Exercise Prescription Changes - 10/19/16 1300      Response to Exercise   Blood Pressure (Admit) 146/62   Blood Pressure (Exercise) 154/74   Blood Pressure (Exit) 128/64   Heart Rate (Admit) 54 bpm   Heart Rate (Exercise) 108 bpm   Heart Rate (Exit) 52 bpm   Oxygen Saturation (Admit) 97 %   Oxygen Saturation (Exercise) 98 %   Rating of Perceived Exertion (Exercise) 11   Perceived Dyspnea (Exercise) 1   Symptoms dizziness (2/10) and SOB   Comments walk test results      History  Smoking Status  . Never Smoker  Smokeless Tobacco  . Never Used    Goals Met:  Exercise tolerated well Personal goals reviewed No report of cardiac concerns or symptoms Strength training completed today  Goals Unmet:  Not Applicable  Comments: Medical evaluation completed and initial ITP created   Dr. Emily Filbert is Medical Director for [place] and [facility].

## 2016-10-19 NOTE — Patient Instructions (Signed)
Patient Instructions  Patient Details  Name: Colin Lopez MRN: [id number] Date of Birth: 1940/03/20 Referring Provider:  [name], [name] M, MD  Below are the personal goals you chose as well as exercise and nutrition goals. Our goal is to help you keep on track towards obtaining and maintaining your goals. We will be discussing your progress on these goals with you throughout the program.  Initial Exercise Prescription:     Initial Exercise Prescription - 10/19/16 1400      Date of Initial Exercise RX and Referring Provider   Date 10/19/16   Referring Provider [name], [name] MD     Recumbant Elliptical   Level 1   RPM 50   Minutes 15   METs 2.5     T5 Nustep   Level 2   SPM 80   Minutes 15   METs 2.5     Track   Laps 32   Minutes 15   METs 2.45     Prescription Details   Frequency (times per week) 3   Duration Progress to 45 minutes of aerobic exercise without signs/symptoms of physical distress     Intensity   THRR 40-80% of Max Heartrate 89-126   Ratings of Perceived Exertion 11-13   Perceived Dyspnea 0-4     Progression   Progression Continue to progress workloads to maintain intensity without signs/symptoms of physical distress.     Resistance Training   Training Prescription Yes   Weight 3 lbs   Reps 10-15      Exercise Goals: Frequency: Be able to perform aerobic exercise three times per week working toward 3-5 days per week.  Intensity: Work with a perceived exertion of 11 (fairly light) - 15 (hard) as tolerated. Follow your new exercise prescription and watch for changes in prescription as you progress with the program. Changes will be reviewed with you when they are made.  Duration: You should be able to do 30 minutes of continuous aerobic exercise in addition to a 5 minute warm-up and a 5 minute cool-down routine.  Nutrition Goals: Your personal nutrition goals will be established when you do your nutrition analysis with the dietician.  The  following are nutrition guidelines to follow: Cholesterol < 200mg /day Sodium < 1500mg /day Fiber: Men over 50 yrs - 30 grams per day  Personal Goals:     Personal Goals and Risk Factors at Admission - 10/19/16 1220      Core Components/Risk Factors/Patient Goals on Admission    Weight Management Yes;Weight Loss   Intervention Weight Management: Develop a combined nutrition and exercise program designed to reach desired caloric intake, while maintaining appropriate intake of nutrient and fiber, sodium and fats, and appropriate energy expenditure required for the weight goal.;Weight Management: Provide education and appropriate resources to help participant work on and attain dietary goals.   Admit Weight 206 lb (93.4 kg)   Goal Weight: Short Term 200 lb (90.7 kg)   Goal Weight: Long Term 196 lb (88.9 kg)   Expected Outcomes Long Term: Adherence to nutrition and physical activity/exercise program aimed toward attainment of established weight goal;Short Term: Continue to assess and modify interventions until short term weight is achieved;Weight Loss: Understanding of general recommendations for a balanced deficit meal plan, which promotes 1-2 lb weight loss per week and includes a negative energy balance of [phone] kcal/d;Understanding recommendations for meals to include 15-35% energy as protein, 25-35% energy from fat, 35-60% energy from carbohydrates, less than 200mg  of dietary cholesterol, 20-35 gm of  total fiber daily;Understanding of distribution of calorie intake throughout the day with the consumption of 4-5 meals/snacks   Hypertension Yes   Intervention Provide education on lifestyle modifcations including regular physical activity/exercise, weight management, moderate sodium restriction and increased consumption of fresh fruit, vegetables, and low fat dairy, alcohol moderation, and smoking cessation.;Monitor prescription use compliance.   Expected Outcomes Short Term: Continued assessment  and intervention until BP is < 140/81mm HG in hypertensive participants. < 130/64mm HG in hypertensive participants with diabetes, heart failure or chronic kidney disease.;Long Term: Maintenance of blood pressure at goal levels.   Lipids Yes   Intervention Provide education and support for participant on nutrition & aerobic/resistive exercise along with prescribed medications to achieve LDL 70mg , HDL >40mg .   Expected Outcomes Short Term: Participant states understanding of desired cholesterol values and is compliant with medications prescribed. Participant is following exercise prescription and nutrition guidelines.;Long Term: Cholesterol controlled with medications as prescribed, with individualized exercise RX and with personalized nutrition plan. Value goals: LDL < 70mg , HDL > 40 mg.   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 Teach and support self-help strategies.;Refer participants experiencing significant psychosocial distress to appropriate mental health specialists for further evaluation and treatment. When possible, include family members and significant others in education/counseling sessions.   Expected Outcomes Short Term: Participant demonstrates changes in health-related behavior, relaxation and other stress management skills, ability to obtain effective social support, and compliance with psychotropic medications if prescribed.;Long Term: Emotional wellbeing is indicated by absence of clinically significant psychosocial distress or social isolation.      Tobacco Use Initial Evaluation: History  Smoking Status  . Never Smoker  Smokeless Tobacco  . Never Used    Exercise Goals and Review:     Exercise Goals    Row Name 10/19/16 1405             Exercise  Goals   Increase Physical Activity Yes       Intervention Provide advice, education, support and counseling about physical activity/exercise needs.;Develop an individualized exercise prescription for aerobic and resistive training based on initial evaluation findings, risk stratification, comorbidities and participant's personal goals.       Expected Outcomes Achievement of increased cardiorespiratory fitness and enhanced flexibility, muscular endurance and strength shown through measurements of functional capacity and personal statement of participant.       Increase Strength and Stamina Yes       Intervention Provide advice, education, support and counseling about physical activity/exercise needs.;Develop an individualized exercise prescription for aerobic and resistive training based on initial evaluation findings, risk stratification, comorbidities and participant's personal goals.       Expected Outcomes Achievement of increased cardiorespiratory fitness and enhanced flexibility, muscular endurance and strength shown through measurements of functional capacity and personal statement of participant.       Able to understand and use rate of perceived exertion (RPE) scale Yes       Intervention Provide education and explanation on how to use RPE scale       Expected Outcomes Short Term: Able to use RPE daily in rehab to express subjective intensity level;Long Term:  Able to use RPE to guide intensity level when exercising independently       Knowledge and understanding of Target Heart Rate Range (THRR) Yes  Intervention Provide education and explanation of THRR including how the numbers were predicted and where they are located for reference       Expected Outcomes Short Term: Able to state/look up THRR;Long Term: Able to use THRR to govern intensity when exercising independently;Short Term: Able to use daily as guideline for intensity in rehab       Able to check pulse independently Yes        Intervention Provide education and demonstration on how to check pulse in carotid and radial arteries.;Review the importance of being able to check your own pulse for safety during independent exercise       Expected Outcomes Short Term: Able to explain why pulse checking is important during independent exercise;Long Term: Able to check pulse independently and accurately       Understanding of Exercise Prescription Yes       Intervention Provide education, explanation, and written materials on patient's individual exercise prescription       Expected Outcomes Short Term: Able to explain program exercise prescription;Long Term: Able to explain home exercise prescription to exercise independently          Copy of goals given to participant.

## 2016-10-19 NOTE — Progress Notes (Signed)
Cardiac Individual Treatment Plan  Patient Details  Name: Colin Lopez MRN: [id number] Date of Birth: 1940-09-19 Referring Provider:     Cardiac Rehab from 10/19/2016 in [facility] Cardiac and Pulmonary Rehab  Referring Provider  [name], [name] MD      Initial Encounter Date:    Cardiac Rehab from 10/19/2016 in [facility] Cardiac and Pulmonary Rehab  Date  10/19/16  Referring Provider  [name], [name] MD      Visit Diagnosis: Status post coronary artery stent placement  Patient's Home Medications on Admission:  Current Outpatient Prescriptions:  .  acetaminophen (TYLENOL) 500 MG tablet, Take 1,000 mg by mouth every 6 (six) hours as needed for headache (pain)., Disp: , Rfl:  .  aspirin EC 81 MG tablet, Take 1 tablet (81 mg total) by mouth daily., Disp: 90 tablet, Rfl: 3 .  atorvastatin (LIPITOR) 40 MG tablet, Take 1 tablet (40 mg total) by mouth at bedtime., Disp: 30 tablet, Rfl: 4 .  clopidogrel (PLAVIX) 75 MG tablet, Take 1 tablet (75 mg total) by mouth daily with breakfast., Disp: 90 tablet, Rfl: 2 .  fenofibrate 160 MG tablet, Take 1 tablet (160 mg total) by mouth daily., Disp: 30 tablet, Rfl: 3 .  Krill Oil 1000 MG CAPS, Take 1,000 mg by mouth at bedtime., Disp: , Rfl:  .  lisinopril (PRINIVIL,ZESTRIL) 10 MG tablet, Take 1 tablet (10 mg total) by mouth daily., Disp: 90 tablet, Rfl: 0 .  nebivolol (BYSTOLIC) 10 MG tablet, Take 1 tablet (10 mg total) by mouth daily. (Patient taking differently: Take 10 mg by mouth at bedtime. ), Disp: 90 tablet, Rfl: 3 .  nitroGLYCERIN (NITROSTAT) 0.4 MG SL tablet, Place 1 tablet (0.4 mg total) under the tongue every 5 (five) minutes as needed for chest pain., Disp: 25 tablet, Rfl: 3 .  OVER THE COUNTER MEDICATION, Apply 1 application topically daily as needed (pain). Horse Liniment otc cream, Disp: , Rfl:  .  pantoprazole (PROTONIX) 40 MG tablet, Take 1 tablet (40 mg total) by mouth daily., Disp: 30 tablet, Rfl: 11 .  rosuvastatin (CRESTOR) 5 MG tablet, , Disp:  , Rfl:   Past Medical History: Past Medical History:  Diagnosis Date  . CAD (coronary artery disease), native coronary artery    09/09/16 PCI/DES x3 to mRCA, OM1 and dLcx/OM2.   . Coronary disease    Status post stenting of the left circumflex coronary in 2009 with a bare-metal stent (with a 3.5x77mm Liberte stent)  . Dyslipidemia   . 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 Headache   . Hyperlipidemia   . Hypertension   . NSTEMI (non-ST elevated myocardial infarction) ([place]) 2009   BMS CFX  . TIA (transient ischemic attack)    history of tia    Tobacco Use: History  Smoking Status  . Never Smoker  Smokeless Tobacco  . Never Used    Labs: Recent Review Flowsheet Data    Labs for ITP Cardiac and Pulmonary Rehab Latest Ref Rng & Units 07/12/2013 05/08/2014 12/17/2014 09/03/2016 10/12/2016   Cholestrol 0 - 200 mg/dL 176 - 283(H) 202(H) 134   LDLCALC 0 - 99 mg/dL 84 - - 99 UNABLE TO CALCULATE IF TRIGLYCERIDE OVER 400 mg/dL   LDLDIRECT mg/dL - - 74.0 - -   HDL >40 mg/dL 24(L) - 27.90(L) 30(L) 21(L)   Trlycerides <150 mg/dL 338(H) - 1196.0 Triglyceride is over 400; calculations on Lipids are invalid.(H) 367(H) 505(H)   Hemoglobin A1c 4.8 - 5.6 % - 5.6 5.7 - 5.1  Exercise Target Goals: Date: 10/19/16  Exercise Program Goal: Individual exercise prescription set with THRR, safety & activity barriers. Participant demonstrates ability to understand and report RPE using BORG scale, to self-measure pulse accurately, and to acknowledge the importance of the exercise prescription.  Exercise Prescription Goal: Starting with aerobic activity 30 plus minutes a day, 3 days per week for initial exercise prescription. Provide home exercise prescription and guidelines that participant acknowledges understanding prior to discharge.  Activity Barriers & Risk Stratification:     Activity Barriers & Cardiac Risk Stratification - 10/19/16 1236      Activity Barriers & Cardiac Risk Stratification   Activity  Barriers Joint Problems;Arthritis;Muscular Weakness;Shortness of Breath;Back Problems;Deconditioning;Balance Concerns;History of Falls  Mr. Holquin's knees and lower back cause issues for him   Cardiac Risk Stratification High      6 Minute Walk:     6 Minute Walk    Row Name 10/19/16 1358         6 Minute Walk   Phase Initial     Distance 1288 feet     Walk Time 6 minutes     # of Rest Breaks 0     MPH 2.44     METS 2.78     RPE 11     Perceived Dyspnea  1     VO2 Peak 9.71     Symptoms Yes (comment)     Comments dizziness 2/10, SOB     Resting HR 6 bpm     Resting BP 146/62     Resting Oxygen Saturation  97 %     Exercise Oxygen Saturation  during 6 min walk 98 %     Max Ex. HR 114 bpm     Max Ex. BP 154/74     2 Minute Post BP 128/64        Oxygen Initial Assessment:   Oxygen Re-Evaluation:   Oxygen Discharge (Final Oxygen Re-Evaluation):   Initial Exercise Prescription:     Initial Exercise Prescription - 10/19/16 1400      Date of Initial Exercise RX and Referring Provider   Date 10/19/16   Referring Provider [name], [name] MD     Recumbant Elliptical   Level 1   RPM 50   Minutes 15   METs 2.5     T5 Nustep   Level 2   SPM 80   Minutes 15   METs 2.5     Track   Laps 32   Minutes 15   METs 2.45     Prescription Details   Frequency (times per week) 3   Duration Progress to 45 minutes of aerobic exercise without signs/symptoms of physical distress     Intensity   THRR 40-80% of Max Heartrate 89-126   Ratings of Perceived Exertion 11-13   Perceived Dyspnea 0-4     Progression   Progression Continue to progress workloads to maintain intensity without signs/symptoms of physical distress.     Resistance Training   Training Prescription Yes   Weight 3 lbs   Reps 10-15      Perform Capillary Blood Glucose checks as needed.  Exercise Prescription Changes:     Exercise Prescription Changes    Row Name 10/19/16 1300              Response to Exercise   Blood Pressure (Admit) 146/62       Blood Pressure (Exercise) 154/74       Blood Pressure (Exit) 128/64  Heart Rate (Admit) 54 bpm       Heart Rate (Exercise) 108 bpm       Heart Rate (Exit) 52 bpm       Oxygen Saturation (Admit) 97 %       Oxygen Saturation (Exercise) 98 %       Rating of Perceived Exertion (Exercise) 11       Perceived Dyspnea (Exercise) 1       Symptoms dizziness (2/10) and SOB       Comments walk test results          Exercise Comments:   Exercise Goals and Review:     Exercise Goals    Row Name 10/19/16 1405             Exercise Goals   Increase Physical Activity Yes       Intervention Provide advice, education, support and counseling about physical activity/exercise needs.;Develop an individualized exercise prescription for aerobic and resistive training based on initial evaluation findings, risk stratification, comorbidities and participant's personal goals.       Expected Outcomes Achievement of increased cardiorespiratory fitness and enhanced flexibility, muscular endurance and strength shown through measurements of functional capacity and personal statement of participant.       Increase Strength and Stamina Yes       Intervention Provide advice, education, support and counseling about physical activity/exercise needs.;Develop an individualized exercise prescription for aerobic and resistive training based on initial evaluation findings, risk stratification, comorbidities and participant's personal goals.       Expected Outcomes Achievement of increased cardiorespiratory fitness and enhanced flexibility, muscular endurance and strength shown through measurements of functional capacity and personal statement of participant.       Able to understand and use rate of perceived exertion (RPE) scale Yes       Intervention Provide education and explanation on how to use RPE scale       Expected Outcomes Short Term: Able to use  RPE daily in rehab to express subjective intensity level;Long Term:  Able to use RPE to guide intensity level when exercising independently       Knowledge and understanding of Target Heart Rate Range (THRR) Yes       Intervention Provide education and explanation of THRR including how the numbers were predicted and where they are located for reference       Expected Outcomes Short Term: Able to state/look up THRR;Long Term: Able to use THRR to govern intensity when exercising independently;Short Term: Able to use daily as guideline for intensity in rehab       Able to check pulse independently Yes       Intervention Provide education and demonstration on how to check pulse in carotid and radial arteries.;Review the importance of being able to check your own pulse for safety during independent exercise       Expected Outcomes Short Term: Able to explain why pulse checking is important during independent exercise;Long Term: Able to check pulse independently and accurately       Understanding of Exercise Prescription Yes       Intervention Provide education, explanation, and written materials on patient's individual exercise prescription       Expected Outcomes Short Term: Able to explain program exercise prescription;Long Term: Able to explain home exercise prescription to exercise independently          Exercise Goals Re-Evaluation :   Discharge Exercise Prescription (Final Exercise Prescription Changes):     Exercise  Prescription Changes - 10/19/16 1300      Response to Exercise   Blood Pressure (Admit) 146/62   Blood Pressure (Exercise) 154/74   Blood Pressure (Exit) 128/64   Heart Rate (Admit) 54 bpm   Heart Rate (Exercise) 108 bpm   Heart Rate (Exit) 52 bpm   Oxygen Saturation (Admit) 97 %   Oxygen Saturation (Exercise) 98 %   Rating of Perceived Exertion (Exercise) 11   Perceived Dyspnea (Exercise) 1   Symptoms dizziness (2/10) and SOB   Comments walk test results       Nutrition:  Target Goals: Understanding of nutrition guidelines, daily intake of sodium 1500mg , cholesterol 200mg , calories 30% from fat and 7% or less from saturated fats, daily to have 5 or more servings of fruits and vegetables.  Biometrics:     Pre Biometrics - 10/19/16 1406      Pre Biometrics   Height 5' 9.6" (1.768 m)   Weight 206 lb (93.4 kg)   Waist Circumference 41.75 inches   Hip Circumference 39 inches   Waist to Hip Ratio 1.07 %   BMI (Calculated) 29.89   Single Leg Stand 1.66 seconds       Nutrition Therapy Plan and Nutrition Goals:   Nutrition Discharge: Rate Your Plate Scores:     Nutrition Assessments - 10/19/16 1224      MEDFICTS Scores   Pre Score 9      Nutrition Goals Re-Evaluation:   Nutrition Goals Discharge (Final Nutrition Goals Re-Evaluation):   Psychosocial: Target Goals: Acknowledge presence or absence of significant depression and/or stress, maximize coping skills, provide positive support system. Participant is able to verbalize types and ability to use techniques and skills needed for reducing stress and depression.   Initial Review & Psychosocial Screening:     Initial Psych Review & Screening - 10/19/16 1226      Initial Review   Current issues with Current Sleep Concerns;Current Stress Concerns  He has issues staying asleep longer than 3 hours a night.   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      Family Dynamics   Good Support System? Yes  wife     Screening Interventions   Interventions Yes;Encouraged to exercise;Program counselor consult   Expected Outcomes Short Term goal:  Utilizing psychosocial counselor, staff and physician to assist with identification of specific Stressors or current issues interfering with healing process. Setting desired goal for each stressor or current issue identified.;Long Term Goal: Stressors or current issues are controlled or eliminated.;Short Term goal: Identification and review with participant of any Quality of Life or Depression concerns found by scoring the questionnaire.;Long Term goal: The participant improves quality of Life and PHQ9 Scores as seen by post scores and/or verbalization of changes      Quality of Life Scores:      Quality of Life - 10/19/16 1232      Quality of Life Scores   Health/Function Pre 20.93 %   Socioeconomic Pre 21 %   Psych/Spiritual Pre 21 %   Family Pre 21 %   GLOBAL Pre 20.97 %      PHQ-9: Recent Review Flowsheet Data    Depression screen  [facility] 2/9 10/19/2016 12/17/2014 07/31/2013   Decreased Interest 1 0 0   Down, Depressed, Hopeless 1 0 0   PHQ - 2 Score 2 0 0   Altered sleeping 2 - -   Tired, decreased energy 3 - -   Change in appetite 2 - -   Feeling bad or failure about yourself  0 - -   Trouble concentrating 1 - -   Moving slowly or fidgety/restless 1 - -   Suicidal thoughts 0 - -   PHQ-9 Score 11 - -   Difficult doing work/chores Somewhat difficult - -     Interpretation of Total Score  Total Score Depression Severity:  1-4 = Minimal depression, 5-9 = Mild depression, 10-14 = Moderate depression, 15-19 = Moderately severe depression, 20-27 = Severe depression   Psychosocial Evaluation and Intervention:   Psychosocial Re-Evaluation:   Psychosocial Discharge (Final Psychosocial Re-Evaluation):   Vocational Rehabilitation: Provide vocational rehab assistance to qualifying candidates.   Vocational Rehab Evaluation & Intervention:     Vocational Rehab - 10/19/16 1234      Initial Vocational Rehab Evaluation & Intervention   Assessment shows need for Vocational  Rehabilitation No      Education: Education Goals: Education classes will be provided on a variety of topics geared toward better understanding of heart health and risk factor modification. Participant will state understanding/return demonstration of topics presented as noted by education test scores.  Learning Barriers/Preferences:     Learning Barriers/Preferences - 10/19/16 1232      Learning Barriers/Preferences   Learning Barriers Hearing;Reading   Learning Preferences Individual Instruction;Verbal Instruction      Education Topics: General Nutrition Guidelines/Fats and Fiber: -Group instruction provided by verbal, written material, models and posters to present the general guidelines for heart healthy nutrition. Gives an explanation and review of dietary fats and fiber.   Controlling Sodium/Reading Food Labels: -Group verbal and written material supporting the discussion of sodium use in heart healthy nutrition. Review and explanation with models, verbal and written materials for utilization of the food label.   Exercise Physiology & Risk Factors: - Group verbal and written instruction with models to review the exercise physiology of the cardiovascular system and associated critical values. Details cardiovascular disease risk factors and the goals associated with each risk factor.   Aerobic Exercise & Resistance Training: - Gives group verbal and written discussion on the health impact of inactivity. On the components of aerobic and resistive training programs and the benefits of this training and how to safely progress through these programs.   Flexibility, Balance, General Exercise Guidelines: - Provides group verbal and written instruction on the benefits of flexibility and balance training programs. Provides general exercise guidelines with specific guidelines to those with heart or lung disease. Demonstration and skill practice provided.   Stress Management: -  Provides group verbal and written instruction about the health risks of elevated stress, cause of high stress, and healthy ways to reduce stress.   Depression: - Provides group verbal and written instruction on the correlation between heart/lung disease and depressed mood, treatment options, and the stigmas associated with seeking treatment.   Anatomy & Physiology of the Heart: - Group verbal and written instruction and models provide basic cardiac anatomy and physiology, with the coronary electrical and arterial systems. Review of: AMI, Angina, Valve disease, Heart Failure, Cardiac Arrhythmia, Pacemakers, and the ICD.   Cardiac Procedures: - Group verbal and written instruction to review commonly prescribed medications for heart disease. Reviews the  medication, class of the drug, and side effects. Includes the steps to properly store meds and maintain the prescription regimen. (beta blockers and nitrates)   Cardiac Medications I: - Group verbal and written instruction to review commonly prescribed medications for heart disease. Reviews the medication, class of the drug, and side effects. Includes the steps to properly store meds and maintain the prescription regimen.   Cardiac Medications II: -Group verbal and written instruction to review commonly prescribed medications for heart disease. Reviews the medication, class of the drug, and side effects. (all other drug classes)    Go Sex-Intimacy & Heart Disease, Get SMART - Goal Setting: - Group verbal and written instruction through game format to discuss heart disease and the return to sexual intimacy. Provides group verbal and written material to discuss and apply goal setting through the application of the S.M.A.R.T. Method.   Other Matters of the Heart: - Provides group verbal, written materials and models to describe Heart Failure, Angina, Valve Disease, Peripheral Artery Disease, and Diabetes in the realm of heart disease. Includes  description of the disease process and treatment options available to the cardiac patient.   Exercise & Equipment Safety: - Individual verbal instruction and demonstration of equipment use and safety with use of the equipment.   Cardiac Rehab from 10/19/2016 in [facility] Cardiac and Pulmonary Rehab  Date  10/19/16  Educator  [facility]  Instruction Review Code  1- Verbalizes Understanding      Infection Prevention: - Provides verbal and written material to individual with discussion of infection control including proper hand washing and proper equipment cleaning during exercise session.   Cardiac Rehab from 10/19/2016 in [facility] Cardiac and Pulmonary Rehab  Date  10/19/16  Educator  [facility]  Instruction Review Code  1- Verbalizes Understanding      Falls Prevention: - Provides verbal and written material to individual with discussion of falls prevention and safety.   Cardiac Rehab from 10/19/2016 in [facility] Cardiac and Pulmonary Rehab  Date  10/19/16  Educator  [facility]  Instruction Review Code  1- Verbalizes Understanding      Diabetes: - Individual verbal and written instruction to review signs/symptoms of diabetes, desired ranges of glucose level fasting, after meals and with exercise. Acknowledge that pre and post exercise glucose checks will be done for 3 sessions at entry of program.   Other: -Provides group and verbal instruction on various topics (see comments)    Knowledge Questionnaire Score:     Knowledge Questionnaire Score - 10/19/16 1233      Knowledge Questionnaire Score   Pre Score 0  Patient marked unsure for most and did not answer the rest. We reviewed the answers together as it is hard for him to read and comprehend      Core Components/Risk Factors/Patient Goals at Admission:     Personal Goals and Risk Factors at Admission - 10/19/16 1220      Core Components/Risk Factors/Patient Goals on Admission    Weight Management Yes;Weight Loss   Intervention Weight Management:  Develop a combined nutrition and exercise program designed to reach desired caloric intake, while maintaining appropriate intake of nutrient and fiber, sodium and fats, and appropriate energy expenditure required for the weight goal.;Weight Management: Provide education and appropriate resources to help participant work on and attain dietary goals.   Admit Weight 206 lb (93.4 kg)   Goal Weight: Short Term 200 lb (90.7 kg)   Goal Weight: Long Term 196 lb (88.9 kg)   Expected Outcomes Long  Term: Adherence to nutrition and physical activity/exercise program aimed toward attainment of established weight goal;Short Term: Continue to assess and modify interventions until short term weight is achieved;Weight Loss: Understanding of general recommendations for a balanced deficit meal plan, which promotes 1-2 lb weight loss per week and includes a negative energy balance of [phone] kcal/d;Understanding recommendations for meals to include 15-35% energy as protein, 25-35% energy from fat, 35-60% energy from carbohydrates, less than 200mg  of dietary cholesterol, 20-35 gm of total fiber daily;Understanding of distribution of calorie intake throughout the day with the consumption of 4-5 meals/snacks   Hypertension Yes   Intervention Provide education on lifestyle modifcations including regular physical activity/exercise, weight management, moderate sodium restriction and increased consumption of fresh fruit, vegetables, and low fat dairy, alcohol moderation, and smoking cessation.;Monitor prescription use compliance.   Expected Outcomes Short Term: Continued assessment and intervention until BP is < 140/74mm HG in hypertensive participants. < 130/78mm HG in hypertensive participants with diabetes, heart failure or chronic kidney disease.;Long Term: Maintenance of blood pressure at goal levels.   Lipids Yes   Intervention Provide education and support for participant on nutrition & aerobic/resistive exercise along with  prescribed medications to achieve LDL 70mg , HDL >40mg .   Expected Outcomes Short Term: Participant states understanding of desired cholesterol values and is compliant with medications prescribed. Participant is following exercise prescription and nutrition guidelines.;Long Term: Cholesterol controlled with medications as prescribed, with individualized exercise RX and with personalized nutrition plan. Value goals: LDL < 70mg , HDL > 40 mg.   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 Teach and support self-help strategies.;Refer participants experiencing significant psychosocial distress to appropriate mental health specialists for further evaluation and treatment. When possible, include family members and significant others in education/counseling sessions.   Expected Outcomes Short Term: Participant demonstrates changes in health-related behavior, relaxation and other stress management skills, ability to obtain effective social support, and compliance with psychotropic medications if prescribed.;Long Term: Emotional wellbeing is indicated by absence of clinically significant psychosocial distress or social isolation.      Core Components/Risk Factors/Patient Goals Review:    Core Components/Risk Factors/Patient Goals at Discharge (Final Review):    ITP Comments:     ITP Comments    Row Name 10/19/16 1214           ITP Comments Med Review completed. Initial ITP created. Diagnosis can be found in [facility] 09/09/16          Comments: Initial ITP

## 2016-10-21 ENCOUNTER — Encounter: Payer: Self-pay | Admitting: *Deleted

## 2016-10-21 ENCOUNTER — Encounter: Payer: Medicare Other | Admitting: *Deleted

## 2016-10-21 DIAGNOSIS — Z955 Presence of coronary angioplasty implant and graft: Secondary | ICD-10-CM | POA: Diagnosis not present

## 2016-10-21 DIAGNOSIS — I2510 Atherosclerotic heart disease of native coronary artery without angina pectoris: Secondary | ICD-10-CM | POA: Diagnosis not present

## 2016-10-21 NOTE — Progress Notes (Signed)
Cardiac Individual Treatment Plan  Patient Details  Name: Colin Lopez MRN: [id number] Date of Birth: June 14, 1940 Referring Provider:     Cardiac Rehab from 10/19/2016 in [facility] Cardiac and Pulmonary Rehab  Referring Provider  [name], [name] MD      Initial Encounter Date:    Cardiac Rehab from 10/19/2016 in [facility] Cardiac and Pulmonary Rehab  Date  10/19/16  Referring Provider  [name], [name] MD      Visit Diagnosis: No diagnosis found.  Patient's Home Medications on Admission:  Current Outpatient Prescriptions:  .  acetaminophen (TYLENOL) 500 MG tablet, Take 1,000 mg by mouth every 6 (six) hours as needed for headache (pain)., Disp: , Rfl:  .  aspirin EC 81 MG tablet, Take 1 tablet (81 mg total) by mouth daily., Disp: 90 tablet, Rfl: 3 .  atorvastatin (LIPITOR) 40 MG tablet, Take 1 tablet (40 mg total) by mouth at bedtime., Disp: 30 tablet, Rfl: 4 .  clopidogrel (PLAVIX) 75 MG tablet, Take 1 tablet (75 mg total) by mouth daily with breakfast., Disp: 90 tablet, Rfl: 2 .  fenofibrate 160 MG tablet, Take 1 tablet (160 mg total) by mouth daily., Disp: 30 tablet, Rfl: 3 .  Krill Oil 1000 MG CAPS, Take 1,000 mg by mouth at bedtime., Disp: , Rfl:  .  lisinopril (PRINIVIL,ZESTRIL) 10 MG tablet, Take 1 tablet (10 mg total) by mouth daily., Disp: 90 tablet, Rfl: 0 .  nebivolol (BYSTOLIC) 10 MG tablet, Take 1 tablet (10 mg total) by mouth daily. (Patient taking differently: Take 10 mg by mouth at bedtime. ), Disp: 90 tablet, Rfl: 3 .  nitroGLYCERIN (NITROSTAT) 0.4 MG SL tablet, Place 1 tablet (0.4 mg total) under the tongue every 5 (five) minutes as needed for chest pain., Disp: 25 tablet, Rfl: 3 .  OVER THE COUNTER MEDICATION, Apply 1 application topically daily as needed (pain). Horse Liniment otc cream, Disp: , Rfl:  .  pantoprazole (PROTONIX) 40 MG tablet, Take 1 tablet (40 mg total) by mouth daily., Disp: 30 tablet, Rfl: 11 .  rosuvastatin (CRESTOR) 5 MG tablet, , Disp: , Rfl:   Past Medical  History: Past Medical History:  Diagnosis Date  . CAD (coronary artery disease), native coronary artery    09/09/16 PCI/DES x3 to mRCA, OM1 and dLcx/OM2.   . Coronary disease    Status post stenting of the left circumflex coronary in 2009 with a bare-metal stent (with a 3.5x34mm Liberte stent)  . Dyslipidemia   . 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 Headache   . Hyperlipidemia   . Hypertension   . NSTEMI (non-ST elevated myocardial infarction) ([place]) 2009   BMS CFX  . TIA (transient ischemic attack)    history of tia    Tobacco Use: History  Smoking Status  . Never Smoker  Smokeless Tobacco  . Never Used    Labs: Recent Review Flowsheet Data    Labs for ITP Cardiac and Pulmonary Rehab Latest Ref Rng & Units 07/12/2013 05/08/2014 12/17/2014 09/03/2016 10/12/2016   Cholestrol 0 - 200 mg/dL 176 - 283(H) 202(H) 134   LDLCALC 0 - 99 mg/dL 84 - - 99 UNABLE TO CALCULATE IF TRIGLYCERIDE OVER 400 mg/dL   LDLDIRECT mg/dL - - 74.0 - -   HDL >40 mg/dL 24(L) - 27.90(L) 30(L) 21(L)   Trlycerides <150 mg/dL 338(H) - 1196.0 Triglyceride is over 400; calculations on Lipids are invalid.(H) 367(H) 505(H)   Hemoglobin A1c 4.8 - 5.6 % - 5.6 5.7 - 5.1  Exercise Target Goals:    Exercise Program Goal: Individual exercise prescription set with THRR, safety & activity barriers. Participant demonstrates ability to understand and report RPE using BORG scale, to self-measure pulse accurately, and to acknowledge the importance of the exercise prescription.  Exercise Prescription Goal: Starting with aerobic activity 30 plus minutes a day, 3 days per week for initial exercise prescription. Provide home exercise prescription and guidelines that participant acknowledges understanding prior to discharge.  Activity Barriers & Risk Stratification:     Activity Barriers & Cardiac Risk Stratification - 10/19/16 1236      Activity Barriers & Cardiac Risk Stratification   Activity Barriers Joint Problems;Arthritis;Muscular  Weakness;Shortness of Breath;Back Problems;Deconditioning;Balance Concerns;History of Falls  Mr. Lykins's knees and lower back cause issues for him   Cardiac Risk Stratification High      6 Minute Walk:     6 Minute Walk    Row Name 10/19/16 1358         6 Minute Walk   Phase Initial     Distance 1288 feet     Walk Time 6 minutes     # of Rest Breaks 0     MPH 2.44     METS 2.78     RPE 11     Perceived Dyspnea  1     VO2 Peak 9.71     Symptoms Yes (comment)     Comments dizziness 2/10, SOB     Resting HR 6 bpm     Resting BP 146/62     Resting Oxygen Saturation  97 %     Exercise Oxygen Saturation  during 6 min walk 98 %     Max Ex. HR 114 bpm     Max Ex. BP 154/74     2 Minute Post BP 128/64        Oxygen Initial Assessment:   Oxygen Re-Evaluation:   Oxygen Discharge (Final Oxygen Re-Evaluation):   Initial Exercise Prescription:     Initial Exercise Prescription - 10/19/16 1400      Date of Initial Exercise RX and Referring Provider   Date 10/19/16   Referring Provider [name], [name] MD     Recumbant Elliptical   Level 1   RPM 50   Minutes 15   METs 2.5     T5 Nustep   Level 2   SPM 80   Minutes 15   METs 2.5     Track   Laps 32   Minutes 15   METs 2.45     Prescription Details   Frequency (times per week) 3   Duration Progress to 45 minutes of aerobic exercise without signs/symptoms of physical distress     Intensity   THRR 40-80% of Max Heartrate 89-126   Ratings of Perceived Exertion 11-13   Perceived Dyspnea 0-4     Progression   Progression Continue to progress workloads to maintain intensity without signs/symptoms of physical distress.     Resistance Training   Training Prescription Yes   Weight 3 lbs   Reps 10-15      Perform Capillary Blood Glucose checks as needed.  Exercise Prescription Changes:     Exercise Prescription Changes    Row Name 10/19/16 1300             Response to Exercise   Blood Pressure  (Admit) 146/62       Blood Pressure (Exercise) 154/74       Blood Pressure (Exit) 128/64  Heart Rate (Admit) 54 bpm       Heart Rate (Exercise) 108 bpm       Heart Rate (Exit) 52 bpm       Oxygen Saturation (Admit) 97 %       Oxygen Saturation (Exercise) 98 %       Rating of Perceived Exertion (Exercise) 11       Perceived Dyspnea (Exercise) 1       Symptoms dizziness (2/10) and SOB       Comments walk test results          Exercise Comments:   Exercise Goals and Review:     Exercise Goals    Row Name 10/19/16 1405             Exercise Goals   Increase Physical Activity Yes       Intervention Provide advice, education, support and counseling about physical activity/exercise needs.;Develop an individualized exercise prescription for aerobic and resistive training based on initial evaluation findings, risk stratification, comorbidities and participant's personal goals.       Expected Outcomes Achievement of increased cardiorespiratory fitness and enhanced flexibility, muscular endurance and strength shown through measurements of functional capacity and personal statement of participant.       Increase Strength and Stamina Yes       Intervention Provide advice, education, support and counseling about physical activity/exercise needs.;Develop an individualized exercise prescription for aerobic and resistive training based on initial evaluation findings, risk stratification, comorbidities and participant's personal goals.       Expected Outcomes Achievement of increased cardiorespiratory fitness and enhanced flexibility, muscular endurance and strength shown through measurements of functional capacity and personal statement of participant.       Able to understand and use rate of perceived exertion (RPE) scale Yes       Intervention Provide education and explanation on how to use RPE scale       Expected Outcomes Short Term: Able to use RPE daily in rehab to express subjective  intensity level;Long Term:  Able to use RPE to guide intensity level when exercising independently       Knowledge and understanding of Target Heart Rate Range (THRR) Yes       Intervention Provide education and explanation of THRR including how the numbers were predicted and where they are located for reference       Expected Outcomes Short Term: Able to state/look up THRR;Long Term: Able to use THRR to govern intensity when exercising independently;Short Term: Able to use daily as guideline for intensity in rehab       Able to check pulse independently Yes       Intervention Provide education and demonstration on how to check pulse in carotid and radial arteries.;Review the importance of being able to check your own pulse for safety during independent exercise       Expected Outcomes Short Term: Able to explain why pulse checking is important during independent exercise;Long Term: Able to check pulse independently and accurately       Understanding of Exercise Prescription Yes       Intervention Provide education, explanation, and written materials on patient's individual exercise prescription       Expected Outcomes Short Term: Able to explain program exercise prescription;Long Term: Able to explain home exercise prescription to exercise independently          Exercise Goals Re-Evaluation :   Discharge Exercise Prescription (Final Exercise Prescription Changes):     Exercise  Prescription Changes - 10/19/16 1300      Response to Exercise   Blood Pressure (Admit) 146/62   Blood Pressure (Exercise) 154/74   Blood Pressure (Exit) 128/64   Heart Rate (Admit) 54 bpm   Heart Rate (Exercise) 108 bpm   Heart Rate (Exit) 52 bpm   Oxygen Saturation (Admit) 97 %   Oxygen Saturation (Exercise) 98 %   Rating of Perceived Exertion (Exercise) 11   Perceived Dyspnea (Exercise) 1   Symptoms dizziness (2/10) and SOB   Comments walk test results      Nutrition:  Target Goals: Understanding of  nutrition guidelines, daily intake of sodium 1500mg , cholesterol 200mg , calories 30% from fat and 7% or less from saturated fats, daily to have 5 or more servings of fruits and vegetables.  Biometrics:     Pre Biometrics - 10/19/16 1406      Pre Biometrics   Height 5' 9.6" (1.768 m)   Weight 206 lb (93.4 kg)   Waist Circumference 41.75 inches   Hip Circumference 39 inches   Waist to Hip Ratio 1.07 %   BMI (Calculated) 29.89   Single Leg Stand 1.66 seconds       Nutrition Therapy Plan and Nutrition Goals:   Nutrition Discharge: Rate Your Plate Scores:     Nutrition Assessments - 10/19/16 1224      MEDFICTS Scores   Pre Score 9      Nutrition Goals Re-Evaluation:   Nutrition Goals Discharge (Final Nutrition Goals Re-Evaluation):   Psychosocial: Target Goals: Acknowledge presence or absence of significant depression and/or stress, maximize coping skills, provide positive support system. Participant is able to verbalize types and ability to use techniques and skills needed for reducing stress and depression.   Initial Review & Psychosocial Screening:     Initial Psych Review & Screening - 10/19/16 1226      Initial Review   Current issues with Current Sleep Concerns;Current Stress Concerns  He has issues staying asleep longer than 3 hours a night.   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      Family Dynamics   Good Support System? Yes  wife     Screening Interventions   Interventions Yes;Encouraged to exercise;Program counselor consult   Expected Outcomes Short Term goal: Utilizing psychosocial counselor, staff and physician to  assist with identification of specific Stressors or current issues interfering with healing process. Setting desired goal for each stressor or current issue identified.;Long Term Goal: Stressors or current issues are controlled or eliminated.;Short Term goal: Identification and review with participant of any Quality of Life or Depression concerns found by scoring the questionnaire.;Long Term goal: The participant improves quality of Life and PHQ9 Scores as seen by post scores and/or verbalization of changes      Quality of Life Scores:      Quality of Life - 10/19/16 1232      Quality of Life Scores   Health/Function Pre 20.93 %   Socioeconomic Pre 21 %   Psych/Spiritual Pre 21 %   Family Pre 21 %   GLOBAL Pre 20.97 %      PHQ-9: Recent Review Flowsheet Data    Depression screen  [facility] 2/9 10/19/2016 12/17/2014 07/31/2013   Decreased Interest 1 0 0   Down, Depressed, Hopeless 1 0 0   PHQ - 2 Score 2 0 0   Altered sleeping 2 - -   Tired, decreased energy 3 - -   Change in appetite 2 - -   Feeling bad or failure about yourself  0 - -   Trouble concentrating 1 - -   Moving slowly or fidgety/restless 1 - -   Suicidal thoughts 0 - -   PHQ-9 Score 11 - -   Difficult doing work/chores Somewhat difficult - -     Interpretation of Total Score  Total Score Depression Severity:  1-4 = Minimal depression, 5-9 = Mild depression, 10-14 = Moderate depression, 15-19 = Moderately severe depression, 20-27 = Severe depression   Psychosocial Evaluation and Intervention:   Psychosocial Re-Evaluation:   Psychosocial Discharge (Final Psychosocial Re-Evaluation):   Vocational Rehabilitation: Provide vocational rehab assistance to qualifying candidates.   Vocational Rehab Evaluation & Intervention:     Vocational Rehab - 10/19/16 1234      Initial Vocational Rehab Evaluation & Intervention   Assessment shows need for Vocational Rehabilitation No      Education: Education Goals:  Education classes will be provided on a variety of topics geared toward better understanding of heart health and risk factor modification. Participant will state understanding/return demonstration of topics presented as noted by education test scores.  Learning Barriers/Preferences:     Learning Barriers/Preferences - 10/19/16 1232      Learning Barriers/Preferences   Learning Barriers Hearing;Reading   Learning Preferences Individual Instruction;Verbal Instruction      Education Topics: General Nutrition Guidelines/Fats and Fiber: -Group instruction provided by verbal, written material, models and posters to present the general guidelines for heart healthy nutrition. Gives an explanation and review of dietary fats and fiber.   Controlling Sodium/Reading Food Labels: -Group verbal and written material supporting the discussion of sodium use in heart healthy nutrition. Review and explanation with models, verbal and written materials for utilization of the food label.   Exercise Physiology & Risk Factors: - Group verbal and written instruction with models to review the exercise physiology of the cardiovascular system and associated critical values. Details cardiovascular disease risk factors and the goals associated with each risk factor.   Aerobic Exercise & Resistance Training: - Gives group verbal and written discussion on the health impact of inactivity. On the components of aerobic and resistive training programs and the benefits of this training and how to safely progress through these programs.   Flexibility, Balance, General Exercise Guidelines: - Provides group verbal and written instruction on the benefits of flexibility and balance training programs. Provides general exercise guidelines with specific guidelines to those with heart or lung disease. Demonstration and skill practice provided.   Stress Management: - Provides group verbal and written instruction about the  health risks of elevated stress, cause of high stress, and healthy ways to reduce stress.   Depression: - Provides group verbal and written instruction on the correlation between heart/lung disease and depressed mood, treatment options, and the stigmas associated with seeking treatment.   Anatomy & Physiology of the Heart: - Group verbal and written instruction and models provide basic cardiac anatomy and physiology, with the coronary electrical and arterial systems. Review of: AMI, Angina, Valve disease, Heart Failure, Cardiac Arrhythmia, Pacemakers, and the ICD.   Cardiac Procedures: - Group verbal and written instruction to review commonly prescribed medications for heart disease. Reviews the  medication, class of the drug, and side effects. Includes the steps to properly store meds and maintain the prescription regimen. (beta blockers and nitrates)   Cardiac Medications I: - Group verbal and written instruction to review commonly prescribed medications for heart disease. Reviews the medication, class of the drug, and side effects. Includes the steps to properly store meds and maintain the prescription regimen.   Cardiac Medications II: -Group verbal and written instruction to review commonly prescribed medications for heart disease. Reviews the medication, class of the drug, and side effects. (all other drug classes)    Go Sex-Intimacy & Heart Disease, Get SMART - Goal Setting: - Group verbal and written instruction through game format to discuss heart disease and the return to sexual intimacy. Provides group verbal and written material to discuss and apply goal setting through the application of the S.M.A.R.T. Method.   Other Matters of the Heart: - Provides group verbal, written materials and models to describe Heart Failure, Angina, Valve Disease, Peripheral Artery Disease, and Diabetes in the realm of heart disease. Includes description of the disease process and treatment options  available to the cardiac patient.   Exercise & Equipment Safety: - Individual verbal instruction and demonstration of equipment use and safety with use of the equipment.   Cardiac Rehab from 10/19/2016 in [facility] Cardiac and Pulmonary Rehab  Date  10/19/16  Educator  [facility]  Instruction Review Code  1- Verbalizes Understanding      Infection Prevention: - Provides verbal and written material to individual with discussion of infection control including proper hand washing and proper equipment cleaning during exercise session.   Cardiac Rehab from 10/19/2016 in [facility] Cardiac and Pulmonary Rehab  Date  10/19/16  Educator  [facility]  Instruction Review Code  1- Verbalizes Understanding      Falls Prevention: - Provides verbal and written material to individual with discussion of falls prevention and safety.   Cardiac Rehab from 10/19/2016 in [facility] Cardiac and Pulmonary Rehab  Date  10/19/16  Educator  [facility]  Instruction Review Code  1- Verbalizes Understanding      Diabetes: - Individual verbal and written instruction to review signs/symptoms of diabetes, desired ranges of glucose level fasting, after meals and with exercise. Acknowledge that pre and post exercise glucose checks will be done for 3 sessions at entry of program.   Other: -Provides group and verbal instruction on various topics (see comments)    Knowledge Questionnaire Score:     Knowledge Questionnaire Score - 10/19/16 1233      Knowledge Questionnaire Score   Pre Score 0  Patient marked unsure for most and did not answer the rest. We reviewed the answers together as it is hard for him to read and comprehend      Core Components/Risk Factors/Patient Goals at Admission:     Personal Goals and Risk Factors at Admission - 10/19/16 1220      Core Components/Risk Factors/Patient Goals on Admission    Weight Management Yes;Weight Loss   Intervention Weight Management: Develop a combined nutrition and exercise program designed  to reach desired caloric intake, while maintaining appropriate intake of nutrient and fiber, sodium and fats, and appropriate energy expenditure required for the weight goal.;Weight Management: Provide education and appropriate resources to help participant work on and attain dietary goals.   Admit Weight 206 lb (93.4 kg)   Goal Weight: Short Term 200 lb (90.7 kg)   Goal Weight: Long Term 196 lb (88.9 kg)   Expected Outcomes Long  Term: Adherence to nutrition and physical activity/exercise program aimed toward attainment of established weight goal;Short Term: Continue to assess and modify interventions until short term weight is achieved;Weight Loss: Understanding of general recommendations for a balanced deficit meal plan, which promotes 1-2 lb weight loss per week and includes a negative energy balance of [phone] kcal/d;Understanding recommendations for meals to include 15-35% energy as protein, 25-35% energy from fat, 35-60% energy from carbohydrates, less than 200mg  of dietary cholesterol, 20-35 gm of total fiber daily;Understanding of distribution of calorie intake throughout the day with the consumption of 4-5 meals/snacks   Hypertension Yes   Intervention Provide education on lifestyle modifcations including regular physical activity/exercise, weight management, moderate sodium restriction and increased consumption of fresh fruit, vegetables, and low fat dairy, alcohol moderation, and smoking cessation.;Monitor prescription use compliance.   Expected Outcomes Short Term: Continued assessment and intervention until BP is < 140/50mm HG in hypertensive participants. < 130/44mm HG in hypertensive participants with diabetes, heart failure or chronic kidney disease.;Long Term: Maintenance of blood pressure at goal levels.   Lipids Yes   Intervention Provide education and support for participant on nutrition & aerobic/resistive exercise along with prescribed medications to achieve LDL 70mg , HDL >40mg .    Expected Outcomes Short Term: Participant states understanding of desired cholesterol values and is compliant with medications prescribed. Participant is following exercise prescription and nutrition guidelines.;Long Term: Cholesterol controlled with medications as prescribed, with individualized exercise RX and with personalized nutrition plan. Value goals: LDL < 70mg , HDL > 40 mg.   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 Teach and support self-help strategies.;Refer participants experiencing significant psychosocial distress to appropriate mental health specialists for further evaluation and treatment. When possible, include family members and significant others in education/counseling sessions.   Expected Outcomes Short Term: Participant demonstrates changes in health-related behavior, relaxation and other stress management skills, ability to obtain effective social support, and compliance with psychotropic medications if prescribed.;Long Term: Emotional wellbeing is indicated by absence of clinically significant psychosocial distress or social isolation.      Core Components/Risk Factors/Patient Goals Review:    Core Components/Risk Factors/Patient Goals at Discharge (Final Review):    ITP Comments:     ITP Comments    Row Name 10/19/16 1214 10/21/16 0837         ITP Comments Med Review completed. Initial ITP created. Diagnosis can be found in [facility] 09/09/16 30 day review. Continue with ITP unless directed changes per Medical Director Review.          Comments:

## 2016-10-21 NOTE — Progress Notes (Signed)
Daily Session Note  Patient Details  Name: GOODWIN KAMPHAUS MRN: [id number] Date of Birth: [birth year] Referring Provider:     Cardiac Rehab from 10/19/2016 in [facility] Cardiac and Pulmonary Rehab  Referring Provider  [name], [name] MD      Encounter Date: 10/21/2016  Check In:     Session Check In - 10/21/16 1631      Check-In   Location [facility]-Cardiac & Pulmonary Rehab   Staff Present Renita Papa, RN Vickki Hearing, BA, ACSM CEP, Exercise Physiologist;Carroll Guy Begin, RN, BSN   Supervising physician immediately available to respond to emergencies See telemetry face sheet for immediately available ER MD   Medication changes reported     No   Fall or balance concerns reported    No   Warm-up and Cool-down Performed on first and last piece of equipment   Resistance Training Performed Yes     Pain Assessment   Currently in Pain? No/denies         History  Smoking Status  . Never Smoker  Smokeless Tobacco  . Never Used    Goals Met:  Independence with exercise equipment Exercise tolerated well No report of cardiac concerns or symptoms Strength training completed today  Goals Unmet:  Not Applicable  Comments: First full day of exercise!  Patient was oriented to gym and equipment including functions, settings, policies, and procedures.  Patient's individual exercise prescription and treatment plan were reviewed.  All starting workloads were established based on the results of the 6 minute walk test done at initial orientation visit.  The plan for exercise progression was also introduced and progression will be customized based on patient's performance and goals.    Dr. Emily Filbert is Medical Director for [place] and [facility].

## 2016-10-22 DIAGNOSIS — Z955 Presence of coronary angioplasty implant and graft: Secondary | ICD-10-CM

## 2016-10-22 DIAGNOSIS — I2510 Atherosclerotic heart disease of native coronary artery without angina pectoris: Secondary | ICD-10-CM | POA: Diagnosis not present

## 2016-10-22 NOTE — Progress Notes (Signed)
Daily Session Note  Patient Details  Name: Colin Lopez MRN: [id number] Date of Birth: [birth year] Referring Provider:     Cardiac Rehab from 10/19/2016 in [facility] Cardiac and Pulmonary Rehab  Referring Provider  [name], [name] MD      Encounter Date: 10/22/2016  Check In:     Session Check In - 10/22/16 1625      Check-In   Location [facility]-Cardiac & Pulmonary Rehab   Staff Present Gerlene Burdock, RN, Vickki Hearing, BA, ACSM CEP, Exercise Physiologist;Joseph Flavia Shipper   Supervising physician immediately available to respond to emergencies See telemetry face sheet for immediately available ER MD   Medication changes reported     No   Fall or balance concerns reported    No   Warm-up and Cool-down Performed on first and last piece of equipment   Resistance Training Performed Yes   VAD Patient? No     Pain Assessment   Currently in Pain? No/denies   Multiple Pain Sites No         History  Smoking Status  . Never Smoker  Smokeless Tobacco  . Never Used    Goals Met:  Independence with exercise equipment Exercise tolerated well No report of cardiac concerns or symptoms Strength training completed today  Goals Unmet:  Not Applicable  Comments: Pt able to follow exercise prescription today without complaint.  Will continue to monitor for progression.   Dr. Emily Filbert is Medical Director for [place] and [facility].

## 2016-10-26 ENCOUNTER — Encounter: Payer: Medicare Other | Admitting: *Deleted

## 2016-10-26 ENCOUNTER — Ambulatory Visit: Payer: Self-pay | Admitting: Internal Medicine

## 2016-10-26 DIAGNOSIS — Z955 Presence of coronary angioplasty implant and graft: Secondary | ICD-10-CM | POA: Diagnosis not present

## 2016-10-26 DIAGNOSIS — I2510 Atherosclerotic heart disease of native coronary artery without angina pectoris: Secondary | ICD-10-CM | POA: Diagnosis not present

## 2016-10-26 NOTE — Progress Notes (Signed)
Daily Session Note  Patient Details  Name: Colin Lopez MRN: [id number] Date of Birth: [birth year] Referring Provider:     Cardiac Rehab from 10/19/2016 in [facility] Cardiac and Pulmonary Rehab  Referring Provider  [name], [name] MD      Encounter Date: 10/26/2016  Check In:     Session Check In - 10/26/16 1629      Check-In   Location [facility]-Cardiac & Pulmonary Rehab   Staff Present Earlean Shawl, BS, ACSM CEP, Exercise Physiologist;Meredith Sherryll Burger, RN Vickki Hearing, BA, ACSM CEP, Exercise Physiologist   Supervising physician immediately available to respond to emergencies See telemetry face sheet for immediately available ER MD   Medication changes reported     No   Fall or balance concerns reported    No   Warm-up and Cool-down Performed on first and last piece of equipment   Resistance Training Performed Yes   VAD Patient? No     Pain Assessment   Currently in Pain? No/denies   Multiple Pain Sites No         History  Smoking Status  . Never Smoker  Smokeless Tobacco  . Never Used    Goals Met:  Independence with exercise equipment Exercise tolerated well No report of cardiac concerns or symptoms Strength training completed today  Goals Unmet:  Not Applicable  Comments: Pt able to follow exercise prescription today without complaint.  Will continue to monitor for progression.    Dr. Emily Filbert is Medical Director for [place] and [facility].

## 2016-10-28 DIAGNOSIS — Z955 Presence of coronary angioplasty implant and graft: Secondary | ICD-10-CM

## 2016-10-28 DIAGNOSIS — I2510 Atherosclerotic heart disease of native coronary artery without angina pectoris: Secondary | ICD-10-CM | POA: Diagnosis not present

## 2016-10-28 NOTE — Progress Notes (Signed)
Daily Session Note  Patient Details  Name: Colin Lopez MRN: [id number] Date of Birth: Aug 12, 1940 Referring Provider:     Cardiac Rehab from 10/19/2016 in [facility] Cardiac and Pulmonary Rehab  Referring Provider  [name], [name] MD      Encounter Date: 10/28/2016  Check In:     Session Check In - 10/28/16 1625      Check-In   Location [facility]-Cardiac & Pulmonary Rehab   Staff Present Gerlene Burdock, RN, Vickki Hearing, BA, ACSM CEP, Exercise Physiologist;Other  [place] physician immediately available to respond to emergencies See telemetry face sheet for immediately available ER MD   Medication changes reported     No   Fall or balance concerns reported    No   Warm-up and Cool-down Performed on first and last piece of equipment   Resistance Training Performed Yes   VAD Patient? No     Pain Assessment   Currently in Pain? No/denies         History  Smoking Status  . Never Smoker  Smokeless Tobacco  . Never Used    Goals Met:  Independence with exercise equipment Exercise tolerated well No report of cardiac concerns or symptoms Strength training completed today  Goals Unmet:  Not Applicable  Comments: Pt able to follow exercise prescription today without complaint.  Will continue to monitor for progression.    Dr. Emily Filbert is Medical Director for [place] and [facility].

## 2016-10-29 DIAGNOSIS — Z955 Presence of coronary angioplasty implant and graft: Secondary | ICD-10-CM

## 2016-10-29 DIAGNOSIS — I2510 Atherosclerotic heart disease of native coronary artery without angina pectoris: Secondary | ICD-10-CM | POA: Diagnosis not present

## 2016-10-29 NOTE — Progress Notes (Signed)
Daily Session Note  Patient Details  Name: Colin Lopez MRN: [id number] Date of Birth: [birth year] Referring Provider:     Cardiac Rehab from 10/19/2016 in [facility] Cardiac and Pulmonary Rehab  Referring Provider  [name], [name] MD      Encounter Date: 10/29/2016  Check In:     Session Check In - 10/29/16 1618      Check-In   Location [facility]-Cardiac & Pulmonary Rehab   Staff Present Gerlene Burdock, RN, Moises Blood, BS, ACSM CEP, Exercise Physiologist;Joseph Flavia Shipper   Supervising physician immediately available to respond to emergencies See telemetry face sheet for immediately available ER MD   Medication changes reported     No   Fall or balance concerns reported    No   Warm-up and Cool-down Performed on first and last piece of equipment   Resistance Training Performed Yes   VAD Patient? No     Pain Assessment   Currently in Pain? No/denies   Multiple Pain Sites No           Exercise Prescription Changes - 10/29/16 1400      Response to Exercise   Blood Pressure (Admit) 116/68   Blood Pressure (Exercise) 122/54   Blood Pressure (Exit) 110/60   Heart Rate (Admit) 80 bpm   Heart Rate (Exercise) 92 bpm   Heart Rate (Exit) 73 bpm   Rating of Perceived Exertion (Exercise) 11   Symptoms none   Duration Progress to 45 minutes of aerobic exercise without signs/symptoms of physical distress   Intensity THRR unchanged     Progression   Progression Continue to progress workloads to maintain intensity without signs/symptoms of physical distress.   Average METs 2.1     Resistance Training   Training Prescription Yes   Weight 3 lbs   Reps 10-15     Interval Training   Interval Training No     Recumbant Elliptical   Level 1   RPM 50   Minutes 15   METs 1.6     T5 Nustep   Level 2   SPM 80   Minutes 15   METs 2.6      History  Smoking Status  . Never Smoker  Smokeless Tobacco  . Never Used    Goals Met:  Independence with exercise  equipment Exercise tolerated well No report of cardiac concerns or symptoms Strength training completed today  Goals Unmet:  Not Applicable  Comments: Pt able to follow exercise prescription today without complaint.  Will continue to monitor for progression.   Dr. Emily Filbert is Medical Director for [place] and [facility].

## 2016-10-30 ENCOUNTER — Ambulatory Visit (INDEPENDENT_AMBULATORY_CARE_PROVIDER_SITE_OTHER): Payer: Medicare Other | Admitting: Family Medicine

## 2016-10-30 ENCOUNTER — Encounter: Payer: Self-pay | Admitting: Family Medicine

## 2016-10-30 ENCOUNTER — Telehealth: Payer: Self-pay | Admitting: Internal Medicine

## 2016-10-30 VITALS — Temp 97.3°F | Wt 207.8 lb

## 2016-10-30 DIAGNOSIS — E785 Hyperlipidemia, unspecified: Secondary | ICD-10-CM | POA: Diagnosis not present

## 2016-10-30 DIAGNOSIS — I639 Cerebral infarction, unspecified: Secondary | ICD-10-CM | POA: Diagnosis not present

## 2016-10-30 DIAGNOSIS — I2510 Atherosclerotic heart disease of native coronary artery without angina pectoris: Secondary | ICD-10-CM | POA: Diagnosis not present

## 2016-10-30 DIAGNOSIS — Z23 Encounter for immunization: Secondary | ICD-10-CM | POA: Diagnosis not present

## 2016-10-30 DIAGNOSIS — I10 Essential (primary) hypertension: Secondary | ICD-10-CM | POA: Diagnosis not present

## 2016-10-30 NOTE — Progress Notes (Signed)
Subjective:    Patient ID: Colin Lopez, male    DOB: Jun 18, 1940, 76 y.o.   MRN: [id number]  HPI This is a 76 yo male, accompanied by his wife, who presents today in follow up of hospital admission from 9/30-10//18 for CVA. He presented with blurred vision, weakness, dizziness. 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 He was found to have incalculable LDL, triglycerides 505. He was put on Lipitor and fenofibrate. He was admitted 09/09/16-09/10/16 with severe 2 vessel obstructive CAD and had three stents placed.   He has been going to cardiac rehab. He is not sleeping well, has 2 dogs who awaken him 4x a night.  No chest pain. Having headaches across eyes. Feels like brain freeze. Started before he had stroke.  No abdominal pain, no constipation, occasional loose stools. No blood or mucus. No bleeding from nose, gums, urine.  Has been attending cardiac rehab. Is fatigued.   Past Medical History:  Diagnosis Date  . CAD (coronary artery disease), native coronary artery    09/09/16 PCI/DES x3 to mRCA, OM1 and dLcx/OM2.   . Coronary disease    Status post stenting of the left circumflex coronary in 2009 with a bare-metal stent (with a 3.5x14mm Liberte stent)  . Dyslipidemia   . 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 Headache   . Hyperlipidemia   . Hypertension   . NSTEMI (non-ST elevated myocardial infarction) ([place]) 2009   BMS CFX  . TIA (transient ischemic attack)    history of tia   Past Surgical History:  Procedure Laterality Date  . CARDIOVASCULAR STRESS TEST  10-03-08   EF 59%  . CORONARY ANGIOPLASTY WITH STENT PLACEMENT  09/09/2016  . CORONARY STENT INTERVENTION N/A 09/09/2016   Procedure: CORONARY STENT INTERVENTION;  Surgeon: [name], [name] M, MD;  Location: [place] [facility];  Service: Cardiovascular;  Laterality: N/A;  . LEFT HEART CATH AND CORONARY ANGIOGRAPHY N/A 09/09/2016   Procedure: LEFT HEART  CATH AND CORONARY ANGIOGRAPHY;  Surgeon: [name], [name] M, MD;  Location: [place] [facility];  Service: Cardiovascular;  Laterality: N/A;  . US ECHOCARDIOGRAPHY  09-21-08   EF 55-60%   Family History  Problem Relation Age of Onset  . Diabetes Mother   . CAD Sister 51       MI, obese  . Cancer Brother        stomach   Social History  Substance Use Topics  . Smoking status: Never Smoker  . Smokeless tobacco: Never Used  . Alcohol use Yes     Comment: Rarely    Review of Systems  Constitutional: Positive for fatigue. Negative for fever.  Respiratory: Negative for cough, chest tightness and shortness of breath.   Cardiovascular: Negative for chest pain, palpitations and leg swelling.  Gastrointestinal: Positive for diarrhea (occasional loose stools). Negative for constipation.  Hematological: Bruises/bleeds easily.       Objective:   Physical Exam    Temp (!) 97.3 F (36.3 C) (Oral)   Wt 207 lb 12 oz (94.2 kg)   SpO2 95%   BMI 30.15 kg/m   BP 118/68 BP Readings from Last 3 Encounters:  10/12/16 (!) 142/73  09/30/16 124/70  09/17/16 136/78   Wt Readings from Last 3 Encounters:  10/30/16 207 lb 12 oz (94.2 kg)  10/19/16 206 lb (93.4 kg)  10/11/16 209 lb (94.8 kg)       Assessment &  Plan:  1. Essential hypertension - well controlled today   2. Hyperlipidemia, unspecified hyperlipidemia type - will recheck labs in 2 weeks - Lipid panel; Future  3. Needs flu shot - Flu vaccine HIGH DOSE PF (Fluzone High dose)  4. Need for vaccination against Streptococcus pneumoniae using pneumococcal conjugate vaccine 13 - Pneumococcal conjugate vaccine 13-valent IM  5. Coronary artery disease involving native coronary artery of native heart without angina pectoris - continue cardiac rehab, cardiology follow up as scheduled - CBC; Future - Basic metabolic panel; Future  6. Cerebrovascular accident (CVA), unspecified mechanism ([place]) - has follow up with neuro - CBC;  Future - Basic metabolic panel; Future - encouraged him to work to improve sleep quantity/quality for overall health  - follow up in 3 months with PCP  Clarene Reamer, FNP-BC  [facility], [place]  10/30/2016 5:59 PM

## 2016-10-30 NOTE — Patient Instructions (Signed)
Please make a lab only visit for 2 weeks. Fast for 12 hours.   Please follow up with Rollene Fare in 3 months

## 2016-10-30 NOTE — Telephone Encounter (Signed)
Axavier Pressley dropped off a Disability Parking form to fill out. Put in [place] tower.

## 2016-11-02 DIAGNOSIS — I2510 Atherosclerotic heart disease of native coronary artery without angina pectoris: Secondary | ICD-10-CM | POA: Diagnosis not present

## 2016-11-02 DIAGNOSIS — Z955 Presence of coronary angioplasty implant and graft: Secondary | ICD-10-CM

## 2016-11-02 NOTE — Progress Notes (Signed)
Daily Session Note  Patient Details  Name: Colin Lopez MRN: [id number] Date of Birth: 1940-11-14 Referring Provider:     Cardiac Rehab from 10/19/2016 in [facility] Cardiac and Pulmonary Rehab  Referring Provider  [name], [name] MD      Encounter Date: 11/02/2016  Check In:     Session Check In - 11/02/16 1649      Check-In   Location [facility]-Cardiac & Pulmonary Rehab   Staff Present Nada Maclachlan, BA, ACSM CEP, Exercise Physiologist;Kelly Amedeo Plenty, BS, ACSM CEP, Exercise Physiologist;Meredith Sherryll Burger, RN BSN   Supervising physician immediately available to respond to emergencies See telemetry face sheet for immediately available ER MD   Medication changes reported     No   Fall or balance concerns reported    No   Warm-up and Cool-down Performed on first and last piece of equipment   Resistance Training Performed Yes   VAD Patient? No     Pain Assessment   Currently in Pain? No/denies         History  Smoking Status  . Never Smoker  Smokeless Tobacco  . Never Used    Goals Met:  Independence with exercise equipment Exercise tolerated well No report of cardiac concerns or symptoms Strength training completed today  Goals Unmet:  Not Applicable  Comments: Pt able to follow exercise prescription today without complaint.  Will continue to monitor for progression.    Dr. Emily Filbert is Medical Director for [place] and [facility].

## 2016-11-05 ENCOUNTER — Encounter: Payer: Medicare Other | Admitting: *Deleted

## 2016-11-05 DIAGNOSIS — Z955 Presence of coronary angioplasty implant and graft: Secondary | ICD-10-CM

## 2016-11-05 DIAGNOSIS — I2510 Atherosclerotic heart disease of native coronary artery without angina pectoris: Secondary | ICD-10-CM | POA: Diagnosis not present

## 2016-11-05 NOTE — Progress Notes (Signed)
Daily Session Note  Patient Details  Name: Colin Lopez MRN: [id number] Date of Birth: 11-12-1940 Referring Provider:     Cardiac Rehab from 10/19/2016 in [facility] Cardiac and Pulmonary Rehab  Referring Provider  [name], [name] MD      Encounter Date: 11/05/2016  Check In:     Session Check In - 11/05/16 1650      Check-In   Location [facility]-Cardiac & Pulmonary Rehab   Staff Present Earlean Shawl, BS, ACSM CEP, Exercise Physiologist;Joseph Tessie Fass RCP,RRT,BSRT;Carroll Enterkin, RN, BSN   Supervising physician immediately available to respond to emergencies See telemetry face sheet for immediately available ER MD   Medication changes reported     No   Fall or balance concerns reported    No   Warm-up and Cool-down Performed on first and last piece of equipment   Resistance Training Performed Yes   VAD Patient? No     Pain Assessment   Currently in Pain? No/denies   Multiple Pain Sites No         History  Smoking Status  . Never Smoker  Smokeless Tobacco  . Never Used    Goals Met:  Independence with exercise equipment Exercise tolerated well No report of cardiac concerns or symptoms Strength training completed today  Goals Unmet:  Not Applicable  Comments: Pt able to follow exercise prescription today without complaint.  Will continue to monitor for progression.    Dr. Emily Filbert is Medical Director for [place] and [facility].

## 2016-11-06 NOTE — Telephone Encounter (Signed)
What we discussed. Could not locate form.

## 2016-11-11 ENCOUNTER — Encounter: Payer: Medicare Other | Admitting: *Deleted

## 2016-11-11 DIAGNOSIS — Z955 Presence of coronary angioplasty implant and graft: Secondary | ICD-10-CM | POA: Diagnosis not present

## 2016-11-11 DIAGNOSIS — I2510 Atherosclerotic heart disease of native coronary artery without angina pectoris: Secondary | ICD-10-CM | POA: Diagnosis not present

## 2016-11-11 NOTE — Progress Notes (Signed)
Daily Session Note  Patient Details  Name: Colin Lopez MRN: [id number] Date of Birth: 16-Mar-1940 Referring Provider:     Cardiac Rehab from 10/19/2016 in [facility] Cardiac and Pulmonary Rehab  Referring Provider  [name], [name] MD      Encounter Date: 11/11/2016  Check In:     Session Check In - 11/11/16 1704      Check-In   Staff Present Heath Lark, RN, BSN, CCRP;Meredith Sherryll Burger, RN Vickki Hearing, Wanda, ACSM CEP, Exercise Physiologist   Supervising physician immediately available to respond to emergencies See telemetry face sheet for immediately available ER MD   Medication changes reported     No   Fall or balance concerns reported    No   Warm-up and Cool-down Performed on first and last piece of equipment   Resistance Training Performed Yes   VAD Patient? No     Pain Assessment   Currently in Pain? No/denies           Exercise Prescription Changes - 11/11/16 1200      Response to Exercise   Blood Pressure (Admit) 130/70   Blood Pressure (Exercise) 128/60   Blood Pressure (Exit) 118/54   Heart Rate (Admit) 75 bpm   Heart Rate (Exercise) 101 bpm   Heart Rate (Exit) 74 bpm   Rating of Perceived Exertion (Exercise) 12   Symptoms noone   Duration Progress to 45 minutes of aerobic exercise without signs/symptoms of physical distress   Intensity THRR unchanged     Progression   Progression Continue to progress workloads to maintain intensity without signs/symptoms of physical distress.   Average METs 2.99     Resistance Training   Training Prescription Yes   Weight 3 lb   Reps 10-15     Interval Training   Interval Training No     T5 Nustep   Level 2   SPM 80   Minutes 15   METs 3.6     Track   Laps 30   Minutes 15   METs 2.38      History  Smoking Status  . Never Smoker  Smokeless Tobacco  . Never Used    Goals Met:  Exercise tolerated well No report of cardiac concerns or symptoms Strength training completed today  Goals Unmet:  Not  Applicable  Comments: Doing well with exercise prescription progression.    Dr. Emily Filbert is Medical Director for [place] and [facility].

## 2016-11-12 ENCOUNTER — Encounter: Payer: Medicare Other | Attending: Cardiology

## 2016-11-12 DIAGNOSIS — I2510 Atherosclerotic heart disease of native coronary artery without angina pectoris: Secondary | ICD-10-CM | POA: Diagnosis not present

## 2016-11-12 DIAGNOSIS — Z955 Presence of coronary angioplasty implant and graft: Secondary | ICD-10-CM | POA: Diagnosis not present

## 2016-11-12 NOTE — Progress Notes (Signed)
Daily Session Note  Patient Details  Name: Colin Lopez MRN: [id number] Date of Birth: Jul 09, 1940 Referring Provider:     Cardiac Rehab from 10/19/2016 in [facility] Cardiac and Pulmonary Rehab  Referring Provider  [name], [name] MD      Encounter Date: 11/12/2016  Check In:     Session Check In - 11/12/16 1610      Check-In   Location [facility]-Cardiac & Pulmonary Rehab   Staff Present Gerlene Burdock, RN, Moises Blood, BS, ACSM CEP, Exercise Physiologist;Joseph Flavia Shipper   Supervising physician immediately available to respond to emergencies See telemetry face sheet for immediately available ER MD   Medication changes reported     No   Fall or balance concerns reported    No   Warm-up and Cool-down Performed on first and last piece of equipment   Resistance Training Performed Yes   VAD Patient? No     Pain Assessment   Currently in Pain? No/denies           Exercise Prescription Changes - 11/12/16 1400      Home Exercise Plan   Plans to continue exercise at Home (comment)   Frequency Add 1 additional day to program exercise sessions.   Initial Home Exercises Provided 11/11/16      History  Smoking Status  . Never Smoker  Smokeless Tobacco  . Never Used    Goals Met:  Independence with exercise equipment Exercise tolerated well No report of cardiac concerns or symptoms Strength training completed today  Goals Unmet:  Not Applicable  Comments: Pt able to follow exercise prescription today without complaint.  Will continue to monitor for progression.   Dr. Emily Filbert is Medical Director for [place] and [facility].

## 2016-11-13 ENCOUNTER — Other Ambulatory Visit (INDEPENDENT_AMBULATORY_CARE_PROVIDER_SITE_OTHER): Payer: Medicare Other

## 2016-11-13 DIAGNOSIS — I639 Cerebral infarction, unspecified: Secondary | ICD-10-CM | POA: Diagnosis not present

## 2016-11-13 DIAGNOSIS — I2510 Atherosclerotic heart disease of native coronary artery without angina pectoris: Secondary | ICD-10-CM | POA: Diagnosis not present

## 2016-11-13 DIAGNOSIS — R7989 Other specified abnormal findings of blood chemistry: Secondary | ICD-10-CM

## 2016-11-13 DIAGNOSIS — E785 Hyperlipidemia, unspecified: Secondary | ICD-10-CM | POA: Diagnosis not present

## 2016-11-13 DIAGNOSIS — R710 Precipitous drop in hematocrit: Secondary | ICD-10-CM

## 2016-11-13 LAB — LIPID PANEL
Cholesterol: 138 mg/dL (ref 0–200)
HDL: 26.7 mg/dL — ABNORMAL LOW (ref >39.00)
LDL Cholesterol: 73 mg/dL (ref 0–99)
NonHDL: 111.52
Total CHOL/HDL Ratio: 5
Triglycerides: 195 mg/dL — ABNORMAL HIGH (ref 0.0–149.0)
VLDL: 39 mg/dL (ref 0.0–40.0)

## 2016-11-13 LAB — BASIC METABOLIC PANEL
BUN: 37 mg/dL — ABNORMAL HIGH (ref 6–23)
CO2: 27 mEq/L (ref 19–32)
Calcium: 9.1 mg/dL (ref 8.4–10.5)
Chloride: 104 mEq/L (ref 96–112)
Creatinine, Ser: 1.45 mg/dL (ref 0.40–1.50)
GFR: 50.16 mL/min — ABNORMAL LOW (ref >60.00)
Glucose, Bld: 101 mg/dL — ABNORMAL HIGH (ref 70–99)
Potassium: 4.7 mEq/L (ref 3.5–5.1)
Sodium: 137 mEq/L (ref 135–145)

## 2016-11-13 LAB — CBC
HCT: 37.6 % — ABNORMAL LOW (ref 39.0–52.0)
Hemoglobin: 12.5 g/dL — ABNORMAL LOW (ref 13.0–17.0)
MCHC: 33.3 g/dL (ref 30.0–36.0)
MCV: 91.9 fl (ref 78.0–100.0)
Platelets: 170 10*3/uL (ref 150.0–400.0)
RBC: 4.09 Mil/uL — ABNORMAL LOW (ref 4.22–5.81)
RDW: 14.3 % (ref 11.5–15.5)
WBC: 5.5 10*3/uL (ref 4.0–10.5)

## 2016-11-14 NOTE — Addendum Note (Signed)
Addended by: Clarene Reamer B on: 11/14/2016 12:00 PM   Modules accepted: Orders

## 2016-11-16 ENCOUNTER — Encounter: Payer: Medicare Other | Admitting: *Deleted

## 2016-11-16 ENCOUNTER — Ambulatory Visit: Payer: Self-pay | Admitting: *Deleted

## 2016-11-16 DIAGNOSIS — I2510 Atherosclerotic heart disease of native coronary artery without angina pectoris: Secondary | ICD-10-CM | POA: Diagnosis not present

## 2016-11-16 DIAGNOSIS — Z955 Presence of coronary angioplasty implant and graft: Secondary | ICD-10-CM

## 2016-11-16 NOTE — Progress Notes (Signed)
Daily Session Note  Patient Details  Name: Colin Lopez MRN: [id number] Date of Birth: 1940/01/27 Referring Provider:     Cardiac Rehab from 10/19/2016 in [facility] Cardiac and Pulmonary Rehab  Referring Provider  [name], [name] MD      Encounter Date: 11/16/2016  Check In: Session Check In - 11/16/16 1710      Check-In   Location  [facility]-Cardiac & Pulmonary Rehab    Staff Present  Renita Papa, RN Moises Blood, BS, ACSM CEP, Exercise Physiologist;Amanda Oletta Darter, [place], ACSM CEP, Exercise Physiologist    Supervising physician immediately available to respond to emergencies  See telemetry face sheet for immediately available ER MD    Medication changes reported      No    Fall or balance concerns reported     No    Warm-up and Cool-down  Performed on first and last piece of equipment    Resistance Training Performed  Yes    VAD Patient?  No      Pain Assessment   Currently in Pain?  No/denies    Multiple Pain Sites  No          Social History   Tobacco Use  Smoking Status Never Smoker  Smokeless Tobacco Never Used    Goals Met:  Independence with exercise equipment Exercise tolerated well No report of cardiac concerns or symptoms Strength training completed today  Goals Unmet:  Not Applicable  Comments: Pt able to follow exercise prescription today without complaint.  Will continue to monitor for progression.    Dr. Emily Filbert is Medical Director for [place] and [facility].

## 2016-11-16 NOTE — Telephone Encounter (Signed)
Lab results given to patient as requested. Also pt denied that he is having in bleeding in urine or stool or any other place. Pt was told that he needed to go back in to the office for labs.  He also stated he had been a little dizzy when standing, stated his b/p was 100/54 when he had gone to rehab. Advised him to get up slowly when standing or from a lying position. Pt voiced understanding. He stated that he would call me back for his lab appointment.

## 2016-11-18 ENCOUNTER — Encounter: Payer: Self-pay | Admitting: *Deleted

## 2016-11-18 DIAGNOSIS — Z955 Presence of coronary angioplasty implant and graft: Secondary | ICD-10-CM

## 2016-11-18 NOTE — Progress Notes (Signed)
Cardiac Individual Treatment Plan  Patient Details  Name: Colin Lopez MRN: [id number] Date of Birth: 09-01-1940 Referring Provider:     Cardiac Rehab from 10/19/2016 in [facility] Cardiac and Pulmonary Rehab  Referring Provider  [name], [name] MD      Initial Encounter Date:    Cardiac Rehab from 10/19/2016 in [facility] Cardiac and Pulmonary Rehab  Date  10/19/16  Referring Provider  [name], [name] MD      Visit Diagnosis: Status post coronary artery stent placement  Patient's Home Medications on Admission:  Current Outpatient Medications:  .  acetaminophen (TYLENOL) 500 MG tablet, Take 1,000 mg by mouth every 6 (six) hours as needed for headache (pain)., Disp: , Rfl:  .  aspirin EC 81 MG tablet, Take 1 tablet (81 mg total) by mouth daily., Disp: 90 tablet, Rfl: 3 .  atorvastatin (LIPITOR) 40 MG tablet, Take 1 tablet (40 mg total) by mouth at bedtime., Disp: 30 tablet, Rfl: 4 .  clopidogrel (PLAVIX) 75 MG tablet, Take 1 tablet (75 mg total) by mouth daily with breakfast., Disp: 90 tablet, Rfl: 2 .  fenofibrate 160 MG tablet, Take 1 tablet (160 mg total) by mouth daily., Disp: 30 tablet, Rfl: 3 .  Krill Oil 1000 MG CAPS, Take 1,000 mg by mouth at bedtime., Disp: , Rfl:  .  lisinopril (PRINIVIL,ZESTRIL) 10 MG tablet, Take 1 tablet (10 mg total) by mouth daily., Disp: 90 tablet, Rfl: 0 .  nebivolol (BYSTOLIC) 10 MG tablet, Take 1 tablet (10 mg total) by mouth daily. (Patient taking differently: Take 10 mg by mouth at bedtime. ), Disp: 90 tablet, Rfl: 3 .  nitroGLYCERIN (NITROSTAT) 0.4 MG SL tablet, Place 1 tablet (0.4 mg total) under the tongue every 5 (five) minutes as needed for chest pain., Disp: 25 tablet, Rfl: 3 .  OVER THE COUNTER MEDICATION, Apply 1 application topically daily as needed (pain). Horse Liniment otc cream, Disp: , Rfl:  .  pantoprazole (PROTONIX) 40 MG tablet, Take 1 tablet (40 mg total) by mouth daily., Disp: 30 tablet, Rfl: 11  Past Medical History: Past Medical History:   Diagnosis Date  . CAD (coronary artery disease), native coronary artery    09/09/16 PCI/DES x3 to mRCA, OM1 and dLcx/OM2.   . Coronary disease    Status post stenting of the left circumflex coronary in 2009 with a bare-metal stent (with a 3.5x12m Liberte stent)  . Dyslipidemia   . 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 Headache   . Hyperlipidemia   . Hypertension   . NSTEMI (non-ST elevated myocardial infarction) ([place] 2009   BMS CFX  . TIA (transient ischemic attack)    history of tia    Tobacco Use: Social History   Tobacco Use  Smoking Status Never Smoker  Smokeless Tobacco Never Used    Labs: Recent Review Flowsheet Data    Labs for ITP Cardiac and Pulmonary Rehab Latest Ref Rng & Units 05/08/2014 12/17/2014 09/03/2016 10/12/2016 11/13/2016   Cholestrol 0 - 200 mg/dL - 283(H) 202(H) 134 138   LDLCALC 0 - 99 mg/dL - - 99 UNABLE TO CALCULATE IF TRIGLYCERIDE OVER 400 mg/dL 73   LDLDIRECT mg/dL - 74.0 - - -   HDL >39.00 mg/dL - 27.90(L) 30(L) 21(L) 26.70(L)   Trlycerides 0.0 - 149.0 mg/dL - 1196.0 Triglyceride is over 400; calculations on Lipids are invalid.(H) 367(H) 505(H) 195.0(H)   Hemoglobin A1c 4.8 - 5.6 % 5.6 5.7 - 5.1 -       Exercise Target Goals:  Exercise Program Goal: Individual exercise prescription set with THRR, safety & activity barriers. Participant demonstrates ability to understand and report RPE using BORG scale, to self-measure pulse accurately, and to acknowledge the importance of the exercise prescription.  Exercise Prescription Goal: Starting with aerobic activity 30 plus minutes a day, 3 days per week for initial exercise prescription. Provide home exercise prescription and guidelines that participant acknowledges understanding prior to discharge.  Activity Barriers & Risk Stratification: Activity Barriers & Cardiac Risk Stratification - 10/19/16 1236      Activity Barriers & Cardiac Risk Stratification   Activity Barriers  Joint Problems;Arthritis;Muscular  Weakness;Shortness of Breath;Back Problems;Deconditioning;Balance Concerns;History of Falls [name]'s knees and lower back cause issues for him   [name]'s knees and lower back cause issues for him   Cardiac Risk Stratification  High       6 Minute Walk: 6 Minute Walk    Row Name 10/19/16 1358         6 Minute Walk   Phase  Initial     Distance  1288 feet     Walk Time  6 minutes     # of Rest Breaks  0     MPH  2.44     METS  2.78     RPE  11     Perceived Dyspnea   1     VO2 Peak  9.71     Symptoms  Yes (comment)     Comments  dizziness 2/10, SOB     Resting HR  6 bpm     Resting BP  146/62     Resting Oxygen Saturation   97 %     Exercise Oxygen Saturation  during 6 min walk  98 %     Max Ex. HR  114 bpm     Max Ex. BP  154/74     2 Minute Post BP  128/64        Oxygen Initial Assessment:   Oxygen Re-Evaluation:   Oxygen Discharge (Final Oxygen Re-Evaluation):   Initial Exercise Prescription: Initial Exercise Prescription - 10/19/16 1400      Date of Initial Exercise RX and Referring Provider   Date  10/19/16    Referring Provider  [name], [name] MD      Recumbant Elliptical   Level  1    RPM  50    Minutes  15    METs  2.5      T5 Nustep   Level  2    SPM  80    Minutes  15    METs  2.5      Track   Laps  32    Minutes  15    METs  2.45      Prescription Details   Frequency (times per week)  3    Duration  Progress to 45 minutes of aerobic exercise without signs/symptoms of physical distress      Intensity   THRR 40-80% of Max Heartrate  89-126    Ratings of Perceived Exertion  11-13    Perceived Dyspnea  0-4      Progression   Progression  Continue to progress workloads to maintain intensity without signs/symptoms of physical distress.      Resistance Training   Training Prescription  Yes    Weight  3 lbs    Reps  10-15       Perform Capillary Blood Glucose checks as needed.  Exercise Prescription Changes:  Exercise  Prescription Changes    Row Name 10/19/16 1300 10/29/16 1400 11/11/16 1200 11/12/16 1400       Response to Exercise   Blood Pressure (Admit)  146/62  116/68  130/70  -    Blood Pressure (Exercise)  154/74  122/54  128/60  -    Blood Pressure (Exit)  128/64  110/60  118/54  -    Heart Rate (Admit)  54 bpm  80 bpm  75 bpm  -    Heart Rate (Exercise)  108 bpm  92 bpm  101 bpm  -    Heart Rate (Exit)  52 bpm  73 bpm  74 bpm  -    Oxygen Saturation (Admit)  97 %  -  -  -    Oxygen Saturation (Exercise)  98 %  -  -  -    Rating of Perceived Exertion (Exercise)  '11  11  12  ' -    Perceived Dyspnea (Exercise)  1  -  -  -    Symptoms  dizziness (2/10) and SOB  none  noone  -    Comments  walk test results  -  -  -    Duration  -  Progress to 45 minutes of aerobic exercise without signs/symptoms of physical distress  Progress to 45 minutes of aerobic exercise without signs/symptoms of physical distress  -    Intensity  -  THRR unchanged  THRR unchanged  -      Progression   Progression  -  Continue to progress workloads to maintain intensity without signs/symptoms of physical distress.  Continue to progress workloads to maintain intensity without signs/symptoms of physical distress.  -    Average METs  -  2.1  2.99  -      Resistance Training   Training Prescription  -  Yes  Yes  -    Weight  -  3 lbs  3 lb  -    Reps  -  10-15  10-15  -      Interval Training   Interval Training  -  No  No  -      Recumbant Elliptical   Level  -  1  -  -    RPM  -  50  -  -    Minutes  -  15  -  -    METs  -  1.6  -  -      T5 Nustep   Level  -  2  2  -    SPM  -  80  80  -    Minutes  -  15  15  -    METs  -  2.6  3.6  -      Track   Laps  -  -  30  -    Minutes  -  -  15  -    METs  -  -  2.38  -      Home Exercise Plan   Plans to continue exercise at  -  -  -  Home (comment)    Frequency  -  -  -  Add 1 additional day to program exercise sessions.    Initial Home Exercises Provided  -  -   -  11/11/16       Exercise Comments: Exercise Comments    Row Name 10/21/16 [phone]  Exercise Comments  First full day of exercise!  Patient was oriented to gym and equipment including functions, settings, policies, and procedures.  Patient's individual exercise prescription and treatment plan were reviewed.  All starting workloads were established based on the results of the 6 minute walk test done at initial orientation visit.  The plan for exercise progression was also introduced and progression will be customized based on patient's performance and goals          Exercise Goals and Review: Exercise Goals    Row Name 10/19/16 1405             Exercise Goals   Increase Physical Activity  Yes       Intervention  Provide advice, education, support and counseling about physical activity/exercise needs.;Develop an individualized exercise prescription for aerobic and resistive training based on initial evaluation findings, risk stratification, comorbidities and participant's personal goals.       Expected Outcomes  Achievement of increased cardiorespiratory fitness and enhanced flexibility, muscular endurance and strength shown through measurements of functional capacity and personal statement of participant.       Increase Strength and Stamina  Yes       Intervention  Provide advice, education, support and counseling about physical activity/exercise needs.;Develop an individualized exercise prescription for aerobic and resistive training based on initial evaluation findings, risk stratification, comorbidities and participant's personal goals.       Expected Outcomes  Achievement of increased cardiorespiratory fitness and enhanced flexibility, muscular endurance and strength shown through measurements of functional capacity and personal statement of participant.       Able to understand and use rate of perceived exertion (RPE) scale  Yes       Intervention  Provide education and  explanation on how to use RPE scale       Expected Outcomes  Short Term: Able to use RPE daily in rehab to express subjective intensity level;Long Term:  Able to use RPE to guide intensity level when exercising independently       Knowledge and understanding of Target Heart Rate Range (THRR)  Yes       Intervention  Provide education and explanation of THRR including how the numbers were predicted and where they are located for reference       Expected Outcomes  Short Term: Able to state/look up THRR;Long Term: Able to use THRR to govern intensity when exercising independently;Short Term: Able to use daily as guideline for intensity in rehab       Able to check pulse independently  Yes       Intervention  Provide education and demonstration on how to check pulse in carotid and radial arteries.;Review the importance of being able to check your own pulse for safety during independent exercise       Expected Outcomes  Short Term: Able to explain why pulse checking is important during independent exercise;Long Term: Able to check pulse independently and accurately       Understanding of Exercise Prescription  Yes       Intervention  Provide education, explanation, and written materials on patient's individual exercise prescription       Expected Outcomes  Short Term: Able to explain program exercise prescription;Long Term: Able to explain home exercise prescription to exercise independently          Exercise Goals Re-Evaluation : Exercise Goals Re-Evaluation    Row Name 10/21/16 1632 10/29/16 1457 11/11/16 1225 11/12/16 1440       Exercise  Goal Re-Evaluation   Exercise Goals Review  Able to understand and use rate of perceived exertion (RPE) scale;Able to check pulse independently;Knowledge and understanding of Target Heart Rate Range (THRR);Understanding of Exercise Prescription;Increase Physical Activity;Increase Strength and Stamina  Increase Physical Activity;Increase Strength and Stamina   Increase Physical Activity;Increase Strength and Stamina;Able to understand and use rate of perceived exertion (RPE) scale;Understanding of Exercise Prescription  Increase Physical Activity;Increase Strength and Stamina;Able to understand and use rate of perceived exertion (RPE) scale;Knowledge and understanding of Target Heart Rate Range (THRR)    Comments  Reviewed RPE scale, THR and program prescription with pt today.  Pt voiced understanding and was given a copy of goals to take home.   Colin Lopez is tolerating exercise well in his first sessions.  Staff will monitor progression.  Colin Lopez is progressing well and has improved overall MET level.  Staff will continue to monitor.  Reviewed home exercise with pt today.  Pt plans to walk for exercise.  Reviewed THR, pulse, RPE, sign and symptoms, NTG use, and when to call 911 or MD.  Also discussed weather considerations and indoor options.  Pt voiced understanding.    Expected Outcomes  Short: Use RPE daily to regulate intensity.  Long: Follow program prescription in THR.  Short - Colin Lopez will attend regularly.  Long - Colin Lopez will continue exercise on his own.  Short - Colin Lopez wil continue to attend regularly.  Long - Colin Lopez will incorporate independent exercise.  Short  - Colin Lopez will check his HR while walking at [facility].  Long - Colin Lopez will exercise 3-5 days per week independently.       Discharge Exercise Prescription (Final Exercise Prescription Changes): Exercise Prescription Changes - 11/12/16 1400      Home Exercise Plan   Plans to continue exercise at  Home (comment)    Frequency  Add 1 additional day to program exercise sessions.    Initial Home Exercises Provided  11/11/16       Nutrition:  Target Goals: Understanding of nutrition guidelines, daily intake of sodium <1554m, cholesterol <2013m calories 30% from fat and 7% or less from saturated fats, daily to have 5 or more servings of fruits and vegetables.  Biometrics: Pre Biometrics - 10/19/16 1406       Pre Biometrics   Height  5' 9.6" (1.768 m)    Weight  206 lb (93.4 kg)    Waist Circumference  41.75 inches    Hip Circumference  39 inches    Waist to Hip Ratio  1.07 %    BMI (Calculated)  29.89    Single Leg Stand  1.66 seconds        Nutrition Therapy Plan and Nutrition Goals: Nutrition Therapy & Goals - 11/12/16 1445      Nutrition Therapy   RD appointment defered  Yes       Nutrition Discharge: Rate Your Plate Scores: Nutrition Assessments - 10/19/16 1224      MEDFICTS Scores   Pre Score  9       Nutrition Goals Re-Evaluation:   Nutrition Goals Discharge (Final Nutrition Goals Re-Evaluation):   Psychosocial: Target Goals: Acknowledge presence or absence of significant depression and/or stress, maximize coping skills, provide positive support system. Participant is able to verbalize types and ability to use techniques and skills needed for reducing stress and depression.   Initial Review & Psychosocial Screening: Initial Psych Review & Screening - 10/19/16 1226      Initial Review   Current issues with  Current Sleep Concerns;Current Stress Concerns He has issues staying asleep longer than 3 hours a night.   He has issues staying asleep longer than 3 hours a night.   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       Family Dynamics   Good Support System?  Yes wife   wife     Screening Interventions   Interventions  Yes;Encouraged to exercise;Program counselor consult    Expected Outcomes  Short Term goal: Utilizing psychosocial counselor, staff and physician to assist with identification of specific Stressors or current  issues interfering with healing process. Setting desired goal for each stressor or current issue identified.;Long Term Goal: Stressors or current issues are controlled or eliminated.;Short Term goal: Identification and review with participant of any Quality of Life or Depression concerns found by scoring the questionnaire.;Long Term goal: The participant improves quality of Life and PHQ9 Scores as seen by post scores and/or verbalization of changes       Quality of Life Scores:  Quality of Life - 10/19/16 1232      Quality of Life Scores   Health/Function Pre  20.93 %    Socioeconomic Pre  21 %    Psych/Spiritual Pre  21 %    Family Pre  21 %    GLOBAL Pre  20.97 %       PHQ-9: Recent Review Flowsheet Data    Depression screen [facility] 2/9 10/19/2016 12/17/2014 07/31/2013   Decreased Interest 1 0 0   Down, Depressed, Hopeless 1 0 0   PHQ - 2 Score 2 0 0   Altered sleeping 2 - -   Tired, decreased energy 3 - -   Change in appetite 2 - -   Feeling bad or failure about yourself  0 - -   Trouble concentrating 1 - -   Moving slowly or fidgety/restless 1 - -   Suicidal thoughts 0 - -   PHQ-9 Score 11 - -   Difficult doing work/chores Somewhat difficult - -     Interpretation of Total Score  Total Score Depression Severity:  1-4 = Minimal depression, 5-9 = Mild depression, 10-14 = Moderate depression, 15-19 = Moderately severe depression, 20-27 = Severe depression   Psychosocial Evaluation and Intervention: Psychosocial Evaluation - 10/21/16 1704      Psychosocial Evaluation & Interventions   Interventions  Encouraged to exercise with the program and follow exercise prescription;Relaxation education;Stress management education    Comments  Counselor met with Mr. Steidle Colin Lopez) and his spouse Collie Siad today for initial psychosocial evaluation.  He is a 76 year old who had (3) stents inserted on 8/29 and then a stroke on 9/30 (a few weeks ago).  Colin Lopez has a strong support system with a spouse of 68  years and 8 children - (7) of which live locally.  Also their 16 year old grandson lives next door.  Colin Lopez has had chronic sleep problems for awhile with approximately (4) hours/night.  Counselor recommended a possible one hour nap in the afternoons to help refresh since he doesn't have problems going to sleep - only interrupted.  Colin Lopez and Collie Siad both agree his appetite has improved  recently.  He denies a history of depression or anxiety or any current symptoms.  Collie Siad states that Billy's mood has improved since the medical procedures now that he is recovering well.  He struggles with a great deal of dizziness throughout the day and will be speaking to his Dr. about this in the near future.  Colin Lopez has some stress with "renters" on their property who don't pay on time including one of their grandsons.  He has goals to increase his stamina and strength and to walk better - with less dizziness.  Staff will be following with Colin Lopez throughout the course of this program.      Expected Outcomes  Colin Lopez will benefit from consistent exercise to achieve his stated goals.  The educational and psychoeducational aspects of this program will be helpful as well.  If his sleep does not improve with consistent exercise, Colin Lopez will speak with his Dr. and may incorporate an afternoon nap to keep from being overly tired.  Colin Lopez will follow with his Dr. about the dizziness that prevents him from walking or doing normal activities.  Staff will follow.    Continue Psychosocial Services   Follow up required by staff       Psychosocial Re-Evaluation: Psychosocial Re-Evaluation    [place] Name 11/12/16 1445             Psychosocial Re-Evaluation   Current issues with  None Identified       Comments  Colin Lopez states he doesnt really have stress at this time.          Psychosocial Discharge (Final Psychosocial Re-Evaluation): Psychosocial Re-Evaluation - 11/12/16 1445      Psychosocial Re-Evaluation   Current issues with  None  Identified    Comments  Colin Lopez states he doesnt really have stress at this time.       Vocational Rehabilitation: Provide vocational rehab assistance to qualifying candidates.   Vocational Rehab Evaluation & Intervention: Vocational Rehab - 10/19/16 1234      Initial Vocational Rehab Evaluation & Intervention   Assessment shows need for Vocational Rehabilitation  No       Education: Education Goals: Education classes will be provided on a variety of topics geared toward better understanding of heart health and risk factor modification. Participant will state understanding/return demonstration of topics presented as noted by education test scores.  Learning Barriers/Preferences: Learning Barriers/Preferences - 10/19/16 1232      Learning Barriers/Preferences   Learning Barriers  Hearing;Reading    Learning Preferences  Individual Instruction;Verbal Instruction       Education Topics: General Nutrition Guidelines/Fats and Fiber: -Group instruction provided by verbal, written material, models and posters to present the general guidelines for heart healthy nutrition. Gives an explanation and review of dietary fats and fiber.   Cardiac Rehab from 11/16/2016 in [facility] Cardiac and Pulmonary Rehab  Date  10/26/16  Educator  PI  Instruction Review Code  1- Verbalizes Understanding      Controlling Sodium/Reading Food Labels: -Group verbal and written material supporting the discussion of sodium use in heart healthy nutrition. Review and explanation with models, verbal and written materials for utilization of the food label.   Cardiac Rehab from 11/16/2016 in [facility] Cardiac and Pulmonary Rehab  Date  11/02/16  Educator  PI  Instruction Review Code  1- Verbalizes Understanding      Exercise Physiology & Risk Factors: - Group verbal and written instruction with models to review the exercise physiology of the cardiovascular system and associated  critical values. Details cardiovascular  disease risk factors and the goals associated with each risk factor.   Aerobic Exercise & Resistance Training: - Gives group verbal and written discussion on the health impact of inactivity. On the components of aerobic and resistive training programs and the benefits of this training and how to safely progress through these programs.   Cardiac Rehab from 11/16/2016 in [facility] Cardiac and Pulmonary Rehab  Date  11/11/16  Educator  AS  Instruction Review Code  1- Verbalizes Understanding      Flexibility, Balance, General Exercise Guidelines: - Provides group verbal and written instruction on the benefits of flexibility and balance training programs. Provides general exercise guidelines with specific guidelines to those with heart or lung disease. Demonstration and skill practice provided.   Cardiac Rehab from 11/16/2016 in [facility] Cardiac and Pulmonary Rehab  Date  11/16/16  Educator  [facility]  Instruction Review Code  1- Verbalizes Understanding      Stress Management: - Provides group verbal and written instruction about the health risks of elevated stress, cause of high stress, and healthy ways to reduce stress.   Depression: - Provides group verbal and written instruction on the correlation between heart/lung disease and depressed mood, treatment options, and the stigmas associated with seeking treatment.   Cardiac Rehab from 11/16/2016 in [facility] Cardiac and Pulmonary Rehab  Date  10/28/16  Educator  [facility]  Instruction Review Code  1- Verbalizes Understanding      Anatomy & Physiology of the Heart: - Group verbal and written instruction and models provide basic cardiac anatomy and physiology, with the coronary electrical and arterial systems. Review of: AMI, Angina, Valve disease, Heart Failure, Cardiac Arrhythmia, Pacemakers, and the ICD.   Cardiac Procedures: - Group verbal and written instruction to review commonly prescribed medications for heart disease. Reviews the medication, class of  the drug, and side effects. Includes the steps to properly store meds and maintain the prescription regimen. (beta blockers and nitrates)   Cardiac Medications I: - Group verbal and written instruction to review commonly prescribed medications for heart disease. Reviews the medication, class of the drug, and side effects. Includes the steps to properly store meds and maintain the prescription regimen.   Cardiac Medications II: -Group verbal and written instruction to review commonly prescribed medications for heart disease. Reviews the medication, class of the drug, and side effects. (all other drug classes)    Go Sex-Intimacy & Heart Disease, Get SMART - Goal Setting: - Group verbal and written instruction through game format to discuss heart disease and the return to sexual intimacy. Provides group verbal and written material to discuss and apply goal setting through the application of the S.M.A.R.T. Method.   Other Matters of the Heart: - Provides group verbal, written materials and models to describe Heart Failure, Angina, Valve Disease, Peripheral Artery Disease, and Diabetes in the realm of heart disease. Includes description of the disease process and treatment options available to the cardiac patient.   Exercise & Equipment Safety: - Individual verbal instruction and demonstration of equipment use and safety with use of the equipment.   Cardiac Rehab from 11/16/2016 in [facility] Cardiac and Pulmonary Rehab  Date  10/19/16  Educator  [facility]  Instruction Review Code  1- Verbalizes Understanding      Infection Prevention: - Provides verbal and written material to individual with discussion of infection control including proper hand washing and proper equipment cleaning during exercise session.   Cardiac Rehab from 11/16/2016 in [facility] Cardiac and  Pulmonary Rehab  Date  10/19/16  Educator  [facility]  Instruction Review Code  1- Verbalizes Understanding      Falls Prevention: - Provides verbal  and written material to individual with discussion of falls prevention and safety.   Cardiac Rehab from 11/16/2016 in [facility] Cardiac and Pulmonary Rehab  Date  10/19/16  Educator  [facility]  Instruction Review Code  1- Verbalizes Understanding      Diabetes: - Individual verbal and written instruction to review signs/symptoms of diabetes, desired ranges of glucose level fasting, after meals and with exercise. Acknowledge that pre and post exercise glucose checks will be done for 3 sessions at entry of program.   Other: -Provides group and verbal instruction on various topics (see comments)    Knowledge Questionnaire Score: Knowledge Questionnaire Score - 10/19/16 1233      Knowledge Questionnaire Score   Pre Score  0 Patient marked unsure for most and did not answer the rest. We reviewed the answers together as it is hard for him to read and comprehend   Patient marked unsure for most and did not answer the rest. We reviewed the answers together as it is hard for him to read and comprehend      Core Components/Risk Factors/Patient Goals at Admission: Personal Goals and Risk Factors at Admission - 10/19/16 1220      Core Components/Risk Factors/Patient Goals on Admission    Weight Management  Yes;Weight Loss    Intervention  Weight Management: Develop a combined nutrition and exercise program designed to reach desired caloric intake, while maintaining appropriate intake of nutrient and fiber, sodium and fats, and appropriate energy expenditure required for the weight goal.;Weight Management: Provide education and appropriate resources to help participant work on and attain dietary goals.    Admit Weight  206 lb (93.4 kg)    Goal Weight: Short Term  200 lb (90.7 kg)    Goal Weight: Long Term  196 lb (88.9 kg)    Expected Outcomes  Long Term: Adherence to nutrition and physical activity/exercise program aimed toward attainment of established weight goal;Short Term: Continue to assess and modify  interventions until short term weight is achieved;Weight Loss: Understanding of general recommendations for a balanced deficit meal plan, which promotes 1-2 lb weight loss per week and includes a negative energy balance of [phone] kcal/d;Understanding recommendations for meals to include 15-35% energy as protein, 25-35% energy from fat, 35-60% energy from carbohydrates, less than 222m of dietary cholesterol, 20-35 gm of total fiber daily;Understanding of distribution of calorie intake throughout the day with the consumption of 4-5 meals/snacks    Hypertension  Yes    Intervention  Provide education on lifestyle modifcations including regular physical activity/exercise, weight management, moderate sodium restriction and increased consumption of fresh fruit, vegetables, and low fat dairy, alcohol moderation, and smoking cessation.;Monitor prescription use compliance.    Expected Outcomes  Short Term: Continued assessment and intervention until BP is < 140/963mHG in hypertensive participants. < 130/8082mG in hypertensive participants with diabetes, heart failure or chronic kidney disease.;Long Term: Maintenance of blood pressure at goal levels.    Lipids  Yes    Intervention  Provide education and support for participant on nutrition & aerobic/resistive exercise along with prescribed medications to achieve LDL <67m83mDL >40mg67m Expected Outcomes  Short Term: Participant states understanding of desired cholesterol values and is compliant with medications prescribed. Participant is following exercise prescription and nutrition guidelines.;Long Term: Cholesterol controlled with medications as prescribed, with  individualized exercise RX and with personalized nutrition plan. Value goals: LDL < 13m, HDL > 40 mg.    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 Teach and support self-help strategies.;Refer participants experiencing significant psychosocial distress to appropriate mental health specialists for further evaluation and treatment. When possible, include family members and significant others in education/counseling sessions.    Expected Outcomes  Short Term: Participant demonstrates changes in health-related behavior, relaxation and other stress management skills, ability to obtain effective social support, and compliance with psychotropic medications if prescribed.;Long Term: Emotional wellbeing is indicated by absence of clinically significant psychosocial distress or social isolation.       Core Components/Risk Factors/Patient Goals Review:  Goals and Risk Factor Review    Row Name 11/12/16 1442             Core Components/Risk Factors/Patient Goals Review   Personal Goals Review  Hypertension;Stress       Review  BAbe Peoplehas been eating more vegetables and grilled chicken.  His BP has been good during HT sessions.  He states he just gave some land to his kids to "take the burden off me."  He doesnt really have any stress.       Expected Outcomes  Short - BAbe Peoplewill continue to incorporate more healthy dietary habits.  Long - BAbe Peoplewill maintain heart health.          Core Components/Risk Factors/Patient Goals at Discharge (Final Review):  Goals and Risk Factor Review - 11/12/16 1442      Core Components/Risk Factors/Patient Goals Review   Personal Goals Review  Hypertension;Stress    Review  BAbe Peoplehas been eating more vegetables and grilled chicken.  His BP has been good during HT sessions.  He states he just gave some land to his kids to "take the burden off me."  He doesnt really have any stress.    Expected Outcomes  Short - BAbe Peoplewill continue to incorporate more healthy  dietary habits.  Long - BAbe Peoplewill maintain heart health.       ITP Comments: ITP Comments    Row Name 10/19/16 1214 10/21/16 0837 11/18/16 0602       ITP Comments  Med Review completed. Initial ITP created. [facility]  30 day review. Continue with ITP unless directed changes per Medical Director Review.   30 day review. Continue with ITP unless directed changes per Medical Director review.         Comments:

## 2016-11-19 DIAGNOSIS — Z955 Presence of coronary angioplasty implant and graft: Secondary | ICD-10-CM

## 2016-11-19 DIAGNOSIS — I2510 Atherosclerotic heart disease of native coronary artery without angina pectoris: Secondary | ICD-10-CM | POA: Diagnosis not present

## 2016-11-19 NOTE — Progress Notes (Signed)
Daily Session Note  Patient Details  Name: Colin Lopez MRN: [id number] Date of Birth: 1940/10/24 Referring Provider:     Cardiac Rehab from 10/19/2016 in [facility] Cardiac and Pulmonary Rehab  Referring Provider  [name], [name] MD      Encounter Date: 11/19/2016  Check In: Session Check In - 11/19/16 1623      Check-In   Location  [facility]-Cardiac & Pulmonary Rehab    Staff Present  Justin Mend RCP,RRT,BSRT;Carroll Enterkin, RN, BSN    Supervising physician immediately available to respond to emergencies  See telemetry face sheet for immediately available ER MD    Medication changes reported      No    Fall or balance concerns reported     No    Warm-up and Cool-down  Performed on first and last piece of equipment    Resistance Training Performed  Yes    VAD Patient?  No      Pain Assessment   Currently in Pain?  No/denies          Social History   Tobacco Use  Smoking Status Never Smoker  Smokeless Tobacco Never Used    Goals Met:  Independence with exercise equipment Exercise tolerated well No report of cardiac concerns or symptoms Strength training completed today  Goals Unmet:  Not Applicable  Comments: Pt able to follow exercise prescription today without complaint.  Will continue to monitor for progression.   Dr. Emily Filbert is Medical Director for [place] and [facility].

## 2016-11-20 ENCOUNTER — Encounter: Payer: Self-pay | Admitting: Neurology

## 2016-11-23 ENCOUNTER — Encounter: Payer: Medicare Other | Admitting: *Deleted

## 2016-11-23 DIAGNOSIS — Z955 Presence of coronary angioplasty implant and graft: Secondary | ICD-10-CM | POA: Diagnosis not present

## 2016-11-23 DIAGNOSIS — I2510 Atherosclerotic heart disease of native coronary artery without angina pectoris: Secondary | ICD-10-CM | POA: Diagnosis not present

## 2016-11-23 NOTE — Progress Notes (Signed)
Daily Session Note  Patient Details  Name: Colin Lopez MRN: [id number] Date of Birth: 1941/01/13 Referring Provider:     Cardiac Rehab from 10/19/2016 in [facility] Cardiac and Pulmonary Rehab  Referring Provider  [name], [name] MD      Encounter Date: 11/23/2016  Check In: Session Check In - 11/23/16 1711      Check-In   Location  [facility]-Cardiac & Pulmonary Rehab    Staff Present  Earlean Shawl, BS, ACSM CEP, Exercise Physiologist;Amanda Oletta Darter, BA, ACSM CEP, Exercise Physiologist;Meredith Sherryll Burger, RN BSN    Supervising physician immediately available to respond to emergencies  See telemetry face sheet for immediately available ER MD    Medication changes reported      No    Fall or balance concerns reported     No    Warm-up and Cool-down  Performed on first and last piece of equipment    Resistance Training Performed  Yes    VAD Patient?  No      Pain Assessment   Currently in Pain?  No/denies    Multiple Pain Sites  No          Social History   Tobacco Use  Smoking Status Never Smoker  Smokeless Tobacco Never Used    Goals Met:  Independence with exercise equipment Exercise tolerated well No report of cardiac concerns or symptoms Strength training completed today  Goals Unmet:  Not Applicable  Comments: Pt able to follow exercise prescription today without complaint.  Will continue to monitor for progression.    Dr. Emily Filbert is Medical Director for [place] and [facility].

## 2016-11-25 ENCOUNTER — Encounter: Payer: Medicare Other | Admitting: *Deleted

## 2016-11-25 DIAGNOSIS — Z955 Presence of coronary angioplasty implant and graft: Secondary | ICD-10-CM

## 2016-11-25 DIAGNOSIS — I2510 Atherosclerotic heart disease of native coronary artery without angina pectoris: Secondary | ICD-10-CM | POA: Diagnosis not present

## 2016-11-25 NOTE — Progress Notes (Signed)
Daily Session Note  Patient Details  Name: Colin Lopez MRN: [id number] Date of Birth: 01/24/1940 Referring Provider:     Cardiac Rehab from 10/19/2016 in [facility] Cardiac and Pulmonary Rehab  Referring Provider  [name], [name] MD      Encounter Date: 11/25/2016  Check In: Session Check In - 11/25/16 1621      Check-In   Location  [facility]-Cardiac & Pulmonary Rehab    Staff Present  Renita Papa, RN Vickki Hearing, BA, ACSM CEP, Exercise Physiologist;Carroll Enterkin, RN, BSN    Supervising physician immediately available to respond to emergencies  See telemetry face sheet for immediately available ER MD    Medication changes reported      No    Fall or balance concerns reported     No    Warm-up and Cool-down  Performed on first and last piece of equipment    Resistance Training Performed  Yes    VAD Patient?  No      Pain Assessment   Currently in Pain?  No/denies        Exercise Prescription Changes - 11/25/16 1200      Response to Exercise   Blood Pressure (Admit)  134/72    Blood Pressure (Exercise)  152/78    Blood Pressure (Exit)  114/66    Heart Rate (Admit)  45 bpm    Heart Rate (Exercise)  111 bpm    Heart Rate (Exit)  60 bpm    Rating of Perceived Exertion (Exercise)  11    Symptoms  none    Duration  Continue with 45 min of aerobic exercise without signs/symptoms of physical distress.    Intensity  THRR unchanged      Progression   Progression  Continue to progress workloads to maintain intensity without signs/symptoms of physical distress.    Average METs  3      Resistance Training   Training Prescription  Yes    Weight  3 lb    Reps  10-15      Interval Training   Interval Training  No      Recumbant Elliptical   Level  2.5    RPM  50    Minutes  15    METs  3      T5 Nustep   Level  3    Minutes  15       Social History   Tobacco Use  Smoking Status Never Smoker  Smokeless Tobacco Never Used    Goals Met:  Independence with  exercise equipment Exercise tolerated well No report of cardiac concerns or symptoms Strength training completed today  Goals Unmet:  Not Applicable  Comments: Pt able to follow exercise prescription today without complaint.  Will continue to monitor for progression.    Dr. Emily Filbert is Medical Director for [place] and [facility].

## 2016-11-26 ENCOUNTER — Encounter: Payer: Medicare Other | Admitting: *Deleted

## 2016-11-26 DIAGNOSIS — I2510 Atherosclerotic heart disease of native coronary artery without angina pectoris: Secondary | ICD-10-CM | POA: Diagnosis not present

## 2016-11-26 DIAGNOSIS — Z955 Presence of coronary angioplasty implant and graft: Secondary | ICD-10-CM

## 2016-11-26 NOTE — Progress Notes (Signed)
Daily Session Note  Patient Details  Name: Colin Lopez MRN: [id number] Date of Birth: 04-Jul-1940 Referring Provider:     Cardiac Rehab from 10/19/2016 in [facility] Cardiac and Pulmonary Rehab  Referring Provider  [name], [name] MD      Encounter Date: 11/26/2016  Check In: Session Check In - 11/26/16 1717      Check-In   Location  [facility]-Cardiac & Pulmonary Rehab    Staff Present  Earlean Shawl, BS, ACSM CEP, Exercise Physiologist;Carroll Enterkin, RN, BSN;Other    Supervising physician immediately available to respond to emergencies  See telemetry face sheet for immediately available ER MD    Medication changes reported      No    Fall or balance concerns reported     No    Warm-up and Cool-down  Performed on first and last piece of equipment    Resistance Training Performed  Yes    VAD Patient?  No      Pain Assessment   Currently in Pain?  No/denies    Multiple Pain Sites  No          Social History   Tobacco Use  Smoking Status Never Smoker  Smokeless Tobacco Never Used    Goals Met:  Independence with exercise equipment Exercise tolerated well No report of cardiac concerns or symptoms Strength training completed today  Goals Unmet:  Not Applicable  Comments: Pt able to follow exercise prescription today without complaint.  Will continue to monitor for progression.    Dr. Emily Filbert is Medical Director for [place] and [facility].

## 2016-11-27 ENCOUNTER — Other Ambulatory Visit (INDEPENDENT_AMBULATORY_CARE_PROVIDER_SITE_OTHER): Payer: Medicare Other

## 2016-11-27 DIAGNOSIS — R710 Precipitous drop in hematocrit: Secondary | ICD-10-CM

## 2016-11-27 DIAGNOSIS — R7989 Other specified abnormal findings of blood chemistry: Secondary | ICD-10-CM | POA: Diagnosis not present

## 2016-11-27 LAB — CBC WITH DIFFERENTIAL/PLATELET
Basophils Absolute: 0.1 10*3/uL (ref 0.0–0.1)
Basophils Relative: 1 % (ref 0.0–3.0)
Eosinophils Absolute: 0.1 10*3/uL (ref 0.0–0.7)
Eosinophils Relative: 1.3 % (ref 0.0–5.0)
HCT: 39.9 % (ref 39.0–52.0)
Hemoglobin: 13 g/dL (ref 13.0–17.0)
Lymphocytes Relative: 20 % (ref 12.0–46.0)
Lymphs Abs: 1.2 10*3/uL (ref 0.7–4.0)
MCHC: 32.5 g/dL (ref 30.0–36.0)
MCV: 92.5 fl (ref 78.0–100.0)
Monocytes Absolute: 0.5 10*3/uL (ref 0.1–1.0)
Monocytes Relative: 8.2 % (ref 3.0–12.0)
Neutro Abs: 4.2 10*3/uL (ref 1.4–7.7)
Neutrophils Relative %: 69.5 % (ref 43.0–77.0)
Platelets: 166 10*3/uL (ref 150.0–400.0)
RBC: 4.32 Mil/uL (ref 4.22–5.81)
RDW: 14.4 % (ref 11.5–15.5)
WBC: 6 10*3/uL (ref 4.0–10.5)

## 2016-11-27 LAB — BASIC METABOLIC PANEL
BUN: 28 mg/dL — ABNORMAL HIGH (ref 6–23)
CO2: 27 mEq/L (ref 19–32)
Calcium: 9.5 mg/dL (ref 8.4–10.5)
Chloride: 103 mEq/L (ref 96–112)
Creatinine, Ser: 1.3 mg/dL (ref 0.40–1.50)
GFR: 56.89 mL/min — ABNORMAL LOW (ref >60.00)
Glucose, Bld: 103 mg/dL — ABNORMAL HIGH (ref 70–99)
Potassium: 4.4 mEq/L (ref 3.5–5.1)
Sodium: 138 mEq/L (ref 135–145)

## 2016-11-30 ENCOUNTER — Telehealth: Payer: Self-pay | Admitting: Cardiology

## 2016-11-30 NOTE — Telephone Encounter (Signed)
New Message     Pt wife states patient has a bad cold and wants to know what OTC medication he can take

## 2016-11-30 NOTE — Telephone Encounter (Signed)
Returned the call to the patient's wife, per the patient's permission. He has been informed that he may try an antihistamine and delsym for a cough. He may also ask the pharmacist at the drug store. They verbalized their understanding.

## 2016-12-01 ENCOUNTER — Ambulatory Visit: Payer: Self-pay | Admitting: Neurology

## 2016-12-02 ENCOUNTER — Encounter: Payer: Medicare Other | Admitting: *Deleted

## 2016-12-02 DIAGNOSIS — Z955 Presence of coronary angioplasty implant and graft: Secondary | ICD-10-CM

## 2016-12-02 DIAGNOSIS — I2510 Atherosclerotic heart disease of native coronary artery without angina pectoris: Secondary | ICD-10-CM | POA: Diagnosis not present

## 2016-12-02 NOTE — Progress Notes (Signed)
Daily Session Note  Patient Details  Name: Colin Lopez MRN: [id number] Date of Birth: [birth year] Referring Provider:     Cardiac Rehab from 10/19/2016 in [facility] Cardiac and Pulmonary Rehab  Referring Provider  [name], [name] MD      Encounter Date: 12/02/2016  Check In: Session Check In - 12/02/16 1622      Check-In   Location  [facility]-Cardiac & Pulmonary Rehab    Staff Present  Renita Papa, RN Vickki Hearing, BA, ACSM CEP, Exercise Physiologist;Carroll Enterkin, RN, BSN    Supervising physician immediately available to respond to emergencies  See telemetry face sheet for immediately available ER MD    Medication changes reported      No    Fall or balance concerns reported     No    Warm-up and Cool-down  Performed on first and last piece of equipment    Resistance Training Performed  Yes    VAD Patient?  No      Pain Assessment   Currently in Pain?  No/denies          Social History   Tobacco Use  Smoking Status Never Smoker  Smokeless Tobacco Never Used    Goals Met:  Independence with exercise equipment Exercise tolerated well No report of cardiac concerns or symptoms Strength training completed today  Goals Unmet:  Not Applicable  Comments: Pt able to follow exercise prescription today without complaint.  Will continue to monitor for progression.    Dr. Emily Filbert is Medical Director for [place] and [facility].

## 2016-12-05 NOTE — Progress Notes (Signed)
[name] Date of Birth: 1940/02/25 Medical Record #[id number]  History of Present Illness: Mr. Guardado is seen for follow up CAD.  He has a history of coronary disease with a non-ST elevation myocardial infarction in 2009. He underwent stenting of the left circumflex coronary with a 3.5 x 16 mm Liberte stent. He does have a history of hypertension and hyperlipidemia. He is intolerant to Crestor.  He was seen in February 2018 with some atypical left shoulder and neck pain. He was tachycardic. A Myoview study was ordered but was never done. He had a CT chest in April which was negative for PE. There was a rounded density in the LLL of unclear etiology. Later PET scan in June showed low metabolic activity and low risk.  He noted severe symptoms of bad indigestion over 2 months with burning in his throat and then bad chest pressure like it is going to explode after eating. He underwent a Lexiscan myoview study which showed multiple perfusion defects and EF 37%. This led to a cardiac cath in late August 2018  that showed 3 vessel disease with successful management with DES x 3. Unable to assess LV function at cath.   He was admitted in late September 2018. 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 No intracranial branch vessel disease.  - recent 2-D echo on 09/16/16 showed EF of 55-60% with grade 1 diastolic dysfunction, no wall motion abnormalities.  - Neurology consulted, continue aspirin, Plavix.    On follow up today he is doing well from a cardiac standpoint. He denies any chest pain or indigestion. Notices no residual from his CVA. Notes a popping in his jaw  and transient numbness in lip when this happens.  No edema. Tolerating medication well. He has a recent chest cold and is still coughing up phlegm in the am. No fever. BP at Cardiac Rehab has been well controlled.   Current Outpatient Medications on File Prior to Visit  Medication Sig Dispense Refill  . acetaminophen (TYLENOL) 500 MG tablet Take 1,000 mg by mouth every 6 (six) hours as needed for headache (pain).    [name] aspirin EC 81 MG tablet Take 1 tablet (81 mg total) by mouth daily. 90 tablet 3  . atorvastatin (LIPITOR) 40 MG tablet Take 1 tablet (40 mg total) by mouth at bedtime. 30 tablet 4  . clopidogrel (PLAVIX) 75 MG tablet Take 1 tablet (75 mg total) by mouth daily with breakfast. 90 tablet 2  . fenofibrate 160 MG tablet Take 1 tablet (160 mg total) by mouth daily. 30 tablet 3  . Krill Oil 1000 MG CAPS Take 1,000 mg by mouth at bedtime.    [name] lisinopril (PRINIVIL,ZESTRIL) 10 MG tablet Take 1 tablet (10 mg total) by mouth daily. 90 tablet 0  . nebivolol (BYSTOLIC) 10 MG tablet Take 1 tablet (10 mg total) by mouth daily. (Patient taking differently: Take 10 mg by mouth at bedtime. ) 90 tablet 3  . nitroGLYCERIN (NITROSTAT) 0.4 MG SL tablet Place 1 tablet (0.4 mg total) under the tongue every 5 (five) minutes as needed for chest pain. 25 tablet 3  . OVER THE COUNTER MEDICATION Apply 1 application topically daily as needed (pain).  Horse Liniment otc cream    . pantoprazole (PROTONIX) 40 MG tablet Take 1 tablet (40 mg total) by mouth daily. 30 tablet 11   No current facility-administered medications on file prior to visit.     Allergies  Allergen Reactions  . Penicillins Hives    Has patient had a PCN reaction causing immediate rash, facial/tongue/throat swelling, SOB or lightheadedness with hypotension: No Has patient had a PCN reaction causing severe rash involving mucus membranes or skin necrosis: Yes Has patient had a PCN reaction that required hospitalization: No Has patient had a PCN  reaction occurring within the last 10 years: No If all of the above answers are "NO", then may proceed with Cephalosporin use.   [name] Doxycycline Rash    Water blisters  . Levaquin [Levofloxacin In D5w] Rash    Past Medical History:  Diagnosis Date  . CAD (coronary artery disease), native coronary artery    09/09/16 PCI/DES x3 to mRCA, OM1 and dLcx/OM2.   . Coronary disease    Status post stenting of the left circumflex coronary in 2009 with a bare-metal stent (with a 3.5x84m Liberte stent)  . Dyslipidemia   . 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 Headache   . Hyperlipidemia   . Hypertension   . NSTEMI (non-ST elevated myocardial infarction) ([place] 2009   BMS CFX  . TIA (transient ischemic attack)    history of tia    Past Surgical History:  Procedure Laterality Date  . CARDIOVASCULAR STRESS TEST  10-03-08   EF 59%  . CORONARY ANGIOPLASTY WITH STENT PLACEMENT  09/09/2016  . CORONARY STENT INTERVENTION N/A 09/09/2016   Procedure: CORONARY STENT INTERVENTION;  Surgeon: JMartinique [name] M, MD;  [facility];  Service: Cardiovascular;  Laterality: N/A;  . LEFT HEART CATH AND CORONARY ANGIOGRAPHY N/A 09/09/2016   Procedure: LEFT HEART CATH AND CORONARY ANGIOGRAPHY;  Surgeon: JMartinique [name] M, MD;  [facility];  Service: Cardiovascular;  Laterality: N/A;  . [place]  09-21-08   EF 55-60%    Social History   Tobacco Use  Smoking Status Never Smoker  Smokeless Tobacco Never Used    Social History   Substance and Sexual Activity  Alcohol Use Yes   Comment: Rarely    Family History  Problem Relation Age of Onset  . Diabetes Mother   . CAD Sister 427      MI, obese  . Cancer Brother        stomach    Review of Systems: As noted in history of present illness.  All other systems were reviewed and are negative.  Physical Exam: BP (!) 146/82   Pulse 60   Ht 6' (1.829 m)   Wt 204 lb 6.4 oz (92.7 kg)   SpO2 95%   BMI 27.72 kg/m  GENERAL:  Well  appearing WM in NAD HEENT:  PERRL, EOMI, sclera are clear. Oropharynx is clear. NECK:  No jugular venous distention, carotid upstroke brisk and symmetric, no bruits, no thyromegaly or adenopathy LUNGS:  Clear to auscultation bilaterally CHEST:  Unremarkable HEART:  RRR,  PMI not displaced or sustained,S1 and S2 within normal limits, no S3, no S4: no clicks, no rubs, no murmurs ABD:  Soft, nontender. BS +, no masses or bruits. No hepatomegaly, no splenomegaly EXT:  2 + pulses throughout, no edema, no cyanosis no clubbing SKIN:  Warm and dry.  No rashes NEURO:  Alert and oriented x 3. Cranial nerves II through XII  intact. PSYCH:  Cognitively intact    LABORATORY DATA:   Lab Results  Component Value Date   WBC 6.0 11/27/2016   HGB 13.0 11/27/2016   HCT 39.9 11/27/2016   PLT 166.0 11/27/2016   GLUCOSE 103 (H) 11/27/2016   CHOL 138 11/13/2016   TRIG 195.0 (H) 11/13/2016   HDL 26.70 (L) 11/13/2016   LDLDIRECT 74.0 12/17/2014   LDLCALC 73 11/13/2016   ALT 18 10/12/2016   AST 18 10/12/2016   NA 138 11/27/2016   K 4.4 11/27/2016   CL 103 11/27/2016   CREATININE 1.30 11/27/2016   BUN 28 (H) 11/27/2016   CO2 27 11/27/2016   TSH 2.786 10/12/2016   PSA 0.27 12/17/2014   INR 0.9 09/08/2016   HGBA1C 5.1 10/12/2016   Myoview 09/03/16: Study Highlights     The left ventricular ejection fraction is moderately decreased (30-44%).  Nuclear stress EF: 37%.  There was no ST segment deviation noted during stress.  This is an intermediate risk study.   1. EF 37%, diffuse hypokinesis.  2. Partially reversible medium-sized, moderate intensity basal inferior and inferoseptal and mid inferior perfusion defect. This suggests prior infarction with significant peri-infarct ischemia.   Intermediate risk study.     Cardiac cath/PCI 09/09/16: Procedures   CORONARY STENT INTERVENTION  LEFT HEART CATH AND CORONARY ANGIOGRAPHY  Conclusion     Mid LAD to Dist LAD lesion, 30  %stenosed.  Prox Cx lesion, 25 %stenosed.  Mid RCA lesion, 95 %stenosed.  A STENT SIERRA 3.50 X 23 MM drug eluting stent was successfully placed.  Post intervention, there is a 0% residual stenosis.  Mid Cx lesion, 70 %stenosed.  A STENT SIERRA 2.75 X 12 MM drug eluting stent was successfully placed.  Post intervention, there is a 0% residual stenosis.  Ost 1st Mrg lesion, 90 %stenosed.  A STENT SIERRA 3.00 X 12 MM drug eluting stent was successfully placed.  Post intervention, there is a 0% residual stenosis.   1. Severe 2 vessel obstructive CAD    - 90% ostial OM1    - 70% distal LCx/OM2    - 95% mid RCA 2. Successful stenting of the mid RCA with DES 3. Successful stenting of the ostium of OM1 with DES 4. Successful stenting of the distal LCx/OM2  Plan: DAPT for at least one year. Anticipate DC in am.    Indications   Angina pectoris (HCC) [I20.9 (ICD-10-CM)]  Procedural Details/Technique   Technical Details Indication: 76 yo WM with remote stent of the LCx presents with angina pectoris and abnormal Myoview study  Procedural Details: The right wrist was prepped, draped, and anesthetized with 1% lidocaine. Using the modified Seldinger technique, a 6 French slender sheath was introduced into the right radial artery. 3 mg of verapamil was administered through the sheath, weight-based unfractionated heparin was administered intravenously. Standard Judkins catheters were used for selective coronary angiography. Due to tortuosity of the innominate artery I was unable to cross the AV with a pigtail catheter. Catheter exchanges were performed over an exchange length guidewire. Following diagnostic angiography we proceeded with multivessel PCI as noted below. There were no immediate procedural complications. A TR band was used for radial hemostasis at the completion of the procedure. The patient was transferred to the post catheterization recovery area for further  monitoring.  Contrast: 250 cc   Estimated blood loss <50 mL.  During this procedure the patient was administered the following to achieve and maintain moderate conscious sedation: Versed 1 mg, Fentanyl  25 mcg, while the patient's heart rate, blood pressure, and oxygen saturation were continuously monitored. The period of conscious sedation was 110 minutes, of which I was present face-to-face 100% of this time.    Complications   Complications documented before study signed (09/09/2016 4:05 PM EDT)    No complications were associated with this study.  Documented by [name], [name] M, MD - 09/09/2016 4:01 PM EDT    Coronary Findings   Dominance: Right  Left Anterior Descending  Mid LAD to Dist LAD lesion, 30% stenosed. The lesion is segmental.  Left Circumflex  Prox Cx lesion, 25% stenosed. The lesion was previously treated using a bare metal stent over 2 years ago.  Mid Cx lesion, 70% stenosed.  Angioplasty: Lesion crossed with guidewire using a WIRE ASAHI PROWATER 180CM. Pre-stent angioplasty was performed using a BALLOON SAPPHIRE 2.5X12. A STENT SIERRA 2.75 X 12 MM drug eluting stent was successfully placed. Stent strut is well apposed. Post-stent angioplasty was performed using a BALLOON [place] EMERGE MR 3.0X8. Maximum pressure: 16 atm. The pre-interventional distal flow is normal (TIMI 3). The post-interventional distal flow is normal (TIMI 3). The intervention was successful . No complications occurred at this lesion.  There is no residual stenosis post intervention.  First Obtuse Marginal Branch  Vessel is large in size.  Ost 1st Mrg lesion, 90% stenosed.  Angioplasty: Lesion crossed with guidewire using a BALLOON SAPPHIRE 2.5X12. A STENT SIERRA 3.00 X 12 MM drug eluting stent was successfully placed. Stent strut is well apposed. Post-stent angioplasty was performed using a BALLOON SAPPHIRE [place] 3.5X8. Maximum pressure: 16 atm. The pre-interventional distal flow is normal (TIMI 3). The  post-interventional distal flow is normal (TIMI 3). The intervention was successful . No complications occurred at this lesion. The OM 1 lesion was at the ostium. It had previously been stented across with BMS. The lesion was wired through the stent struts. The lesion was stented at the ostium ( T stent ). After it was postdilated there was some plaque shift into the LCx distal to the lesion but within the old stent. This was dilated with a 3.25 mm balloon with good result.  There is no residual stenosis post intervention.  Right Coronary Artery  Vessel is large.  Mid RCA lesion, 95% stenosed.  Angioplasty: Lesion crossed with guidewire using a WIRE ASAHI PROWATER 180CM. Pre-stent angioplasty was performed using a BALLOON SAPPHIRE 2.5X12. Maximum pressure: 8 atm. A STENT SIERRA 3.50 X 23 MM drug eluting stent was successfully placed. Stent strut is well apposed. Post-stent angioplasty was performed using a BALLOON [place] EMERGE MR 4.0X15. Maximum pressure: 16 atm. The pre-interventional distal flow is normal (TIMI 3). The post-interventional distal flow is normal (TIMI 3). The intervention was successful . No complications occurred at this lesion. Unable to engage the RCA with a LA guide. HS guide engaged well but was inadequate to support delivery of stent to lesion. Holly Bodily used for additional support with good results.  There is no residual stenosis post intervention.  Coronary Diagrams   Diagnostic Diagram       Post-Intervention Diagram          Echo 09/16/16: Study Conclusions  - Left ventricle: The cavity size was normal. Wall thickness was   increased in a pattern of severe LVH. Systolic function was   normal. The estimated ejection fraction was in the range of 55%   to 60%. Wall motion was normal; there were no regional wall   motion abnormalities. Doppler parameters  are consistent with   abnormal left ventricular relaxation (grade 1 diastolic   dysfunction). - Aortic valve:  Trileaflet; moderately calcified leaflets.   Sclerosis without stenosis. - Aorta: Mildly dilated aortic root. Aortic root dimension: 38 mm   (ED). - Mitral valve: Mildly calcified annulus. There was no significant   regurgitation. - Right ventricle: The cavity size was normal. Systolic function   was normal. - Tricuspid valve: Peak RV-RA gradient (S): 22 mm Hg. - Pulmonary arteries: PA peak pressure: 25 mm Hg (S). - Inferior vena cava: The vessel was normal in size. The   respirophasic diameter changes were in the normal range (= 50%),   consistent with normal central venous pressure.  Impressions:  - Normal LV size with severe LV hypertrophy. EF 55-60%. Normal RV   size and systolic function. Aortic valve sclerosis without   significant stenosis.  Echo 09/16/16: Study Conclusions  - Left ventricle: The cavity size was normal. Wall thickness was   increased in a pattern of severe LVH. Systolic function was   normal. The estimated ejection fraction was in the range of 55%   to 60%. Wall motion was normal; there were no regional wall   motion abnormalities. Doppler parameters are consistent with   abnormal left ventricular relaxation (grade 1 diastolic   dysfunction). - Aortic valve: Trileaflet; moderately calcified leaflets.   Sclerosis without stenosis. - Aorta: Mildly dilated aortic root. Aortic root dimension: 38 mm   (ED). - Mitral valve: Mildly calcified annulus. There was no significant   regurgitation. - Right ventricle: The cavity size was normal. Systolic function   was normal. - Tricuspid valve: Peak RV-RA gradient (S): 22 mm Hg. - Pulmonary arteries: PA peak pressure: 25 mm Hg (S). - Inferior vena cava: The vessel was normal in size. The   respirophasic diameter changes were in the normal range (= 50%),   consistent with normal central venous pressure.  Impressions:  - Normal LV size with severe LV hypertrophy. EF 55-60%. Normal RV   size and systolic  function. Aortic valve sclerosis without   significant stenosis.  Assessment / Plan: 1. Coronary disease with prior stenting of the left circumflex coronary in 2009 with a bare-metal stent. Now with recent stenting of the first OM, distal LCx and mid RCA with DES in August 2018.   Continue DAPT with ASA and Plavix for at least one year possibly indefinitely.  Bystolic 10 mg daily. On statin.  2. 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 No CHF. - well controlled on Bystolic and lisinopril.  3. Dyslipidemia. Intolerant to statins in the past. With recent stenting and CVA he is now on lipitor 40 mg daily and is tolerating this well. Labs look good.  4. 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 S/p CVA.  6. LV function noted on Myoview with EF 37%. By Echo done  post revascularization EF  normal. Continue Bystolic and lisinopril.   I will follow up in 6 months.

## 2016-12-07 ENCOUNTER — Encounter: Payer: Self-pay | Admitting: Cardiology

## 2016-12-07 ENCOUNTER — Ambulatory Visit: Payer: Medicare Other | Admitting: Cardiology

## 2016-12-07 VITALS — BP 146/82 | HR 60 | Ht 72.0 in | Wt 204.4 lb

## 2016-12-07 DIAGNOSIS — I2510 Atherosclerotic heart disease of native coronary artery without angina pectoris: Secondary | ICD-10-CM | POA: Diagnosis not present

## 2016-12-07 DIAGNOSIS — I119 Hypertensive heart disease without heart failure: Secondary | ICD-10-CM | POA: Diagnosis not present

## 2016-12-07 DIAGNOSIS — E785 Hyperlipidemia, unspecified: Secondary | ICD-10-CM

## 2016-12-07 NOTE — Patient Instructions (Signed)
Continue your current therapy  I will see you in 6 months.   

## 2016-12-09 ENCOUNTER — Encounter: Payer: Medicare Other | Admitting: *Deleted

## 2016-12-09 DIAGNOSIS — I2510 Atherosclerotic heart disease of native coronary artery without angina pectoris: Secondary | ICD-10-CM | POA: Diagnosis not present

## 2016-12-09 DIAGNOSIS — Z955 Presence of coronary angioplasty implant and graft: Secondary | ICD-10-CM | POA: Diagnosis not present

## 2016-12-09 NOTE — Progress Notes (Signed)
Daily Session Note  Patient Details  Name: Colin Lopez MRN: [id number] Date of Birth: 10-Feb-1940 Referring Provider:     Cardiac Rehab from 10/19/2016 in [facility] Cardiac and Pulmonary Rehab  Referring Provider  [name], [name] MD      Encounter Date: 12/09/2016  Check In: Session Check In - 12/09/16 1645      Check-In   Location  [facility]-Cardiac & Pulmonary Rehab    Staff Present  Renita Papa, RN Vickki Hearing, BA, ACSM CEP, Exercise Physiologist;Carroll Enterkin, RN, BSN    Supervising physician immediately available to respond to emergencies  See telemetry face sheet for immediately available ER MD    Medication changes reported      No    Fall or balance concerns reported     No    Warm-up and Cool-down  Performed on first and last piece of equipment    Resistance Training Performed  Yes    VAD Patient?  No      Pain Assessment   Currently in Pain?  No/denies          Social History   Tobacco Use  Smoking Status Never Smoker  Smokeless Tobacco Never Used    Goals Met:  Independence with exercise equipment Using PLB without cueing & demonstrates good technique Exercise tolerated well No report of cardiac concerns or symptoms Strength training completed today  Goals Unmet:  Not Applicable  Comments: Pt able to follow exercise prescription today without complaint.  Will continue to monitor for progression.    Dr. Emily Filbert is Medical Director for [place] and [facility].

## 2016-12-10 ENCOUNTER — Encounter: Payer: Medicare Other | Admitting: *Deleted

## 2016-12-10 DIAGNOSIS — I2510 Atherosclerotic heart disease of native coronary artery without angina pectoris: Secondary | ICD-10-CM | POA: Diagnosis not present

## 2016-12-10 DIAGNOSIS — Z955 Presence of coronary angioplasty implant and graft: Secondary | ICD-10-CM

## 2016-12-10 NOTE — Progress Notes (Signed)
Daily Session Note  Patient Details  Name: Colin Lopez MRN: [id number] Date of Birth: 1940-05-16 Referring Provider:     Cardiac Rehab from 10/19/2016 in [facility] Cardiac and Pulmonary Rehab  Referring Provider  [name], [name] MD      Encounter Date: 12/10/2016  Check In: Session Check In - 12/10/16 1638      Check-In   Location  [facility]-Cardiac & Pulmonary Rehab    Staff Present  Earlean Shawl, BS, ACSM CEP, Exercise Physiologist;Joseph Tessie Fass RCP,RRT,BSRT;Carroll Enterkin, RN, BSN    Supervising physician immediately available to respond to emergencies  See telemetry face sheet for immediately available ER MD    Medication changes reported      No    Fall or balance concerns reported     No    Warm-up and Cool-down  Performed on first and last piece of equipment    Resistance Training Performed  Yes    VAD Patient?  No      Pain Assessment   Currently in Pain?  No/denies    Multiple Pain Sites  No          Social History   Tobacco Use  Smoking Status Never Smoker  Smokeless Tobacco Never Used    Goals Met:  Independence with exercise equipment Exercise tolerated well No report of cardiac concerns or symptoms Strength training completed today  Goals Unmet:  Not Applicable  Comments: Pt able to follow exercise prescription today without complaint.  Will continue to monitor for progression.    Dr. Emily Filbert is Medical Director for [place] and [facility].

## 2016-12-14 ENCOUNTER — Other Ambulatory Visit: Payer: Self-pay | Admitting: Cardiology

## 2016-12-14 ENCOUNTER — Encounter: Payer: Medicare Other | Attending: Cardiology

## 2016-12-14 DIAGNOSIS — Z955 Presence of coronary angioplasty implant and graft: Secondary | ICD-10-CM | POA: Insufficient documentation

## 2016-12-14 DIAGNOSIS — I2510 Atherosclerotic heart disease of native coronary artery without angina pectoris: Secondary | ICD-10-CM | POA: Diagnosis not present

## 2016-12-14 NOTE — Progress Notes (Signed)
Daily Session Note  Patient Details  Name: Colin Lopez MRN: [id number] Date of Birth: 1940-06-29 Referring Provider:     Cardiac Rehab from 10/19/2016 in [facility] Cardiac and Pulmonary Rehab  Referring Provider  [name], [name] MD      Encounter Date: 12/14/2016  Check In: Session Check In - 12/14/16 1652      Check-In   Location  [facility]-Cardiac & Pulmonary Rehab    Staff Present  Earlean Shawl, BS, ACSM CEP, Exercise Physiologist;Juliann Olesky Oletta Darter, BA, ACSM CEP, Exercise Physiologist;Carroll Enterkin, RN, BSN    Supervising physician immediately available to respond to emergencies  See telemetry face sheet for immediately available ER MD    Medication changes reported      No    Fall or balance concerns reported     No    Warm-up and Cool-down  Performed on first and last piece of equipment    Resistance Training Performed  Yes    VAD Patient?  No      Pain Assessment   Currently in Pain?  No/denies    Multiple Pain Sites  No          Social History   Tobacco Use  Smoking Status Never Smoker  Smokeless Tobacco Never Used    Goals Met:  Independence with exercise equipment Exercise tolerated well No report of cardiac concerns or symptoms Strength training completed today  Goals Unmet:  Not Applicable  Comments: Pt able to follow exercise prescription today without complaint.  Will continue to monitor for progression.    Dr. Emily Filbert is Medical Director for [place] [facility].

## 2016-12-15 NOTE — Telephone Encounter (Signed)
This is Dr. Jordan's pt. °

## 2016-12-16 ENCOUNTER — Encounter: Payer: Medicare Other | Admitting: *Deleted

## 2016-12-16 ENCOUNTER — Encounter: Payer: Self-pay | Admitting: *Deleted

## 2016-12-16 DIAGNOSIS — Z955 Presence of coronary angioplasty implant and graft: Secondary | ICD-10-CM | POA: Diagnosis not present

## 2016-12-16 DIAGNOSIS — I2510 Atherosclerotic heart disease of native coronary artery without angina pectoris: Secondary | ICD-10-CM | POA: Diagnosis not present

## 2016-12-16 NOTE — Progress Notes (Signed)
Cardiac Individual Treatment Plan  Patient Details  Name: Colin Lopez MRN: [id number] Date of Birth: 12-22-1940 Referring Provider:     Cardiac Rehab from 10/19/2016 in [facility] Cardiac and Pulmonary Rehab  Referring Provider  Lopez, [name] MD      Initial Encounter Date:    Cardiac Rehab from 10/19/2016 in [facility] Cardiac and Pulmonary Rehab  Date  10/19/16  Referring Provider  Lopez, [name] MD      Visit Diagnosis: Status post coronary artery stent placement  Patient's Home Medications on Admission:  Current Outpatient Medications:  .  acetaminophen (TYLENOL) 500 MG tablet, Take 1,000 mg by mouth every 6 (six) hours as needed for headache (pain)., Disp: , Rfl:  .  aspirin EC 81 MG tablet, Take 1 tablet (81 mg total) by mouth daily., Disp: 90 tablet, Rfl: 3 .  atorvastatin (LIPITOR) 40 MG tablet, Take 1 tablet (40 mg total) by mouth at bedtime., Disp: 30 tablet, Rfl: 4 .  clopidogrel (PLAVIX) 75 MG tablet, Take 1 tablet (75 mg total) by mouth daily with breakfast., Disp: 90 tablet, Rfl: 2 .  fenofibrate 160 MG tablet, Take 1 tablet (160 mg total) by mouth daily., Disp: 30 tablet, Rfl: 3 .  Krill Oil 1000 MG CAPS, Take 1,000 mg by mouth at bedtime., Disp: , Rfl:  .  lisinopril (PRINIVIL,ZESTRIL) 10 MG tablet, TAKE 1 TABLET BY MOUTH ONCE DAILY, Disp: 90 tablet, Rfl: 1 .  nebivolol (BYSTOLIC) 10 MG tablet, Take 1 tablet (10 mg total) by mouth daily. (Patient taking differently: Take 10 mg by mouth at bedtime. ), Disp: 90 tablet, Rfl: 3 .  nitroGLYCERIN (NITROSTAT) 0.4 MG SL tablet, Place 1 tablet (0.4 mg total) under the tongue every 5 (five) minutes as needed for chest pain., Disp: 25 tablet, Rfl: 3 .  OVER THE COUNTER MEDICATION, Apply 1 application topically daily as needed (pain). Horse Liniment otc cream, Disp: , Rfl:  .  pantoprazole (PROTONIX) 40 MG tablet, Take 1 tablet (40 mg total) by mouth daily., Disp: 30 tablet, Rfl: 11  Past Medical History: Past Medical History:  Diagnosis  Date  . CAD (coronary artery disease), native coronary artery    09/09/16 PCI/DES x3 to mRCA, OM1 and dLcx/OM2.   . Coronary disease    Status post stenting of the left circumflex coronary in 2009 with a bare-metal stent (with a 3.5x74m Liberte stent)  . Dyslipidemia   . 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 Headache   . Hyperlipidemia   . Hypertension   . [place]STEMI (non-ST elevated [place] infarction) [place]   BMS CFX  . TIA (transient ischemic attack)    history of tia    Tobacco Use: Social History   Tobacco Use  Smoking Status Never Smoker  Smokeless Tobacco Never Used    Labs: Recent Review Flowsheet Data    Labs for ITP Cardiac and Pulmonary Rehab Latest Ref Rng & Units 05/08/2014 12/17/2014 09/03/2016 10/12/2016 11/13/2016   Cholestrol 0 - 200 mg/dL - 283(H) 202(H) 134 138   LDLCALC 0 - 99 mg/dL - - 99 UNABLE TO CALCULATE IF TRIGLYCERIDE OVER 400 mg/dL 73   LDLDIRECT mg/dL - 74.0 - - -   HDL >39.00 mg/dL - 27.90(L) 30(L) 21(L) 26.70(L)   Trlycerides 0.0 - 149.0 mg/dL - 1196.0 Triglyceride is over 400; calculations on Lipids are invalid.(H) 367(H) 505(H) 195.0(H)   Hemoglobin A1c 4.8 - 5.6 % 5.6 5.7 - 5.1 -       Exercise Target Goals:  Exercise Program Goal: Individual exercise prescription set with THRR, safety & activity barriers. Participant demonstrates ability to understand and report RPE using BORG scale, to self-measure pulse accurately, and to acknowledge the importance of the exercise prescription.  Exercise Prescription Goal: Starting with aerobic activity 30 plus minutes a day, 3 days per week for initial exercise prescription. Provide home exercise prescription and guidelines that participant acknowledges understanding prior to discharge.  Activity Barriers & Risk Stratification: Activity Barriers & Cardiac Risk Stratification - 10/19/16 1236      Activity Barriers & Cardiac Risk Stratification   Activity Barriers  Joint Problems;Arthritis;Muscular  Weakness;Shortness of Breath;Back Problems;Deconditioning;Balance Concerns;History of Falls Colin Lopez's knees and lower back cause issues for him    Cardiac Risk Stratification  High       6 Minute Walk: 6 Minute Walk    Row Name 10/19/16 1358         6 Minute Walk   Phase  Initial     Distance  1288 feet     Walk Time  6 minutes     # of Rest Breaks  0     MPH  2.44     METS  2.78     RPE  11     Perceived Dyspnea   1     VO2 Peak  9.71     Symptoms  Yes (comment)     Comments  dizziness 2/10, SOB     Resting HR  6 bpm     Resting BP  146/62     Resting Oxygen Saturation   97 %     Exercise Oxygen Saturation  during 6 min walk  98 %     Max Ex. HR  114 bpm     Max Ex. BP  154/74     2 Minute Post BP  128/64        Oxygen Initial Assessment:   Oxygen Re-Evaluation:   Oxygen Discharge (Final Oxygen Re-Evaluation):   Initial Exercise Prescription: Initial Exercise Prescription - 10/19/16 1400      Date of Initial Exercise RX and Referring Provider   Date  10/19/16    Referring Provider  Lopez, [name] MD      Recumbant Elliptical   Level  1    RPM  50    Minutes  15    METs  2.5      T5 Nustep   Level  2    SPM  80    Minutes  15    METs  2.5      Track   Laps  32    Minutes  15    METs  2.45      Prescription Details   Frequency (times per week)  3    Duration  Progress to 45 minutes of aerobic exercise without signs/symptoms of physical distress      Intensity   THRR 40-80% of Max Heartrate  89-126    Ratings of Perceived Exertion  11-13    Perceived Dyspnea  0-4      Progression   Progression  Continue to progress workloads to maintain intensity without signs/symptoms of physical distress.      Resistance Training   Training Prescription  Yes    Weight  3 lbs    Reps  10-15       Perform Capillary Blood Glucose checks as needed.  Exercise Prescription Changes: Exercise Prescription Changes    Row Name 10/19/16 1300 10/29/16  1400  11/11/16 1200 11/12/16 1400 11/25/16 1200     Response to Exercise   Blood Pressure (Admit)  146/62  116/68  130/70  -  134/72   Blood Pressure (Exercise)  154/74  122/54  128/60  -  152/78   Blood Pressure (Exit)  128/64  110/60  118/54  -  114/66   Heart Rate (Admit)  54 bpm  80 bpm  75 bpm  -  45 bpm   Heart Rate (Exercise)  108 bpm  92 bpm  101 bpm  -  111 bpm   Heart Rate (Exit)  52 bpm  73 bpm  74 bpm  -  60 bpm   Oxygen Saturation (Admit)  97 %  -  -  -  -   Oxygen Saturation (Exercise)  98 %  -  -  -  -   Rating of Perceived Exertion (Exercise)  '11  11  12  '$ -  11   Perceived Dyspnea (Exercise)  1  -  -  -  -   Symptoms  dizziness (2/10) and SOB  none  noone  -  none   Comments  walk test results  -  -  -  -   Duration  -  Progress to 45 minutes of aerobic exercise without signs/symptoms of physical distress  Progress to 45 minutes of aerobic exercise without signs/symptoms of physical distress  -  Continue with 45 min of aerobic exercise without signs/symptoms of physical distress.   Intensity  -  THRR unchanged  THRR unchanged  -  THRR unchanged     Progression   Progression  -  Continue to progress workloads to maintain intensity without signs/symptoms of physical distress.  Continue to progress workloads to maintain intensity without signs/symptoms of physical distress.  -  Continue to progress workloads to maintain intensity without signs/symptoms of physical distress.   Average METs  -  2.1  2.99  -  3     Resistance Training   Training Prescription  -  Yes  Yes  -  Yes   Weight  -  3 lbs  3 lb  -  3 lb   Reps  -  10-15  10-15  -  10-15     Interval Training   Interval Training  -  No  No  -  No     Recumbant Elliptical   Level  -  1  -  -  2.5   RPM  -  50  -  -  50   Minutes  -  15  -  -  15   METs  -  1.6  -  -  3     T5 Nustep   Level  -  2  2  -  3   SPM  -  80  80  -  -   Minutes  -  15  15  -  15   METs  -  2.6  3.6  -  -     Track   Laps  -  -  30  -  -    Minutes  -  -  15  -  -   METs  -  -  2.38  -  -     Home Exercise Plan   Plans to continue exercise at  -  -  -  Home (comment)  -   Frequency  -  -  -  Add 1 additional day to program exercise sessions.  -   Initial Home Exercises Provided  -  -  -  11/11/16  -   [place] Name 12/11/16 1100             Response to Exercise   Blood Pressure (Admit)  138/74       Blood Pressure (Exercise)  158/84       Blood Pressure (Exit)  124/62       Heart Rate (Admit)  80 bpm       Heart Rate (Exercise)  117 bpm       Heart Rate (Exit)  67 bpm       Rating of Perceived Exertion (Exercise)  15       Symptoms  none       Duration  Continue with 45 min of aerobic exercise without signs/symptoms of physical distress.       Intensity  THRR unchanged         Progression   Progression  Continue to progress workloads to maintain intensity without signs/symptoms of physical distress.       Average METs  3.12         Resistance Training   Training Prescription  Yes       Weight  3 lb       Reps  10-15         Interval Training   Interval Training  No         REL-XR   Level  1       Minutes  15       METs  3         T5 Nustep   Level  3       Minutes  15       METs  4         Home Exercise Plan   Plans to continue exercise at  Home (comment)       Frequency  Add 1 additional day to program exercise sessions.       Initial Home Exercises Provided  11/11/16          Exercise Comments: Exercise Comments    Row Name 10/21/16 1631           Exercise Comments  First full day of exercise!  Patient was oriented to gym and equipment including functions, settings, policies, and procedures.  Patient's individual exercise prescription and treatment plan were reviewed.  All starting workloads were established based on the results of the 6 minute walk test done at initial orientation visit.  The plan for exercise progression was also introduced and progression will be customized based on  patient's performance and goals          Exercise Goals and Review: Exercise Goals    Row Name 10/19/16 1405             Exercise Goals   Increase Physical Activity  Yes       Intervention  Provide advice, education, support and counseling about physical activity/exercise needs.;Develop an individualized exercise prescription for aerobic and resistive training based on initial evaluation findings, risk stratification, comorbidities and participant's personal goals.       Expected Outcomes  Achievement of increased cardiorespiratory fitness and enhanced flexibility, muscular endurance and strength shown through measurements of functional capacity and personal statement of participant.       Increase Strength and Stamina  Yes  Intervention  Provide advice, education, support and counseling about physical activity/exercise needs.;Develop an individualized exercise prescription for aerobic and resistive training based on initial evaluation findings, risk stratification, comorbidities and participant's personal goals.       Expected Outcomes  Achievement of increased cardiorespiratory fitness and enhanced flexibility, muscular endurance and strength shown through measurements of functional capacity and personal statement of participant.       Able to understand and use rate of perceived exertion (RPE) scale  Yes       Intervention  Provide education and explanation on how to use RPE scale       Expected Outcomes  Short Term: Able to use RPE daily in rehab to express subjective intensity level;Long Term:  Able to use RPE to guide intensity level when exercising independently       Knowledge and understanding of Target Heart Rate Range (THRR)  Yes       Intervention  Provide education and explanation of THRR including how the numbers were predicted and where they are located for reference       Expected Outcomes  Short Term: Able to state/look up THRR;Long Term: Able to use THRR to govern  intensity when exercising independently;Short Term: Able to use daily as guideline for intensity in rehab       Able to check pulse independently  Yes       Intervention  Provide education and demonstration on how to check pulse in carotid and radial arteries.;Review the importance of being able to check your own pulse for safety during independent exercise       Expected Outcomes  Short Term: Able to explain why pulse checking is important during independent exercise;Long Term: Able to check pulse independently and accurately       Understanding of Exercise Prescription  Yes       Intervention  Provide education, explanation, and written materials on patient's individual exercise prescription       Expected Outcomes  Short Term: Able to explain program exercise prescription;Long Term: Able to explain home exercise prescription to exercise independently          Exercise Goals Re-Evaluation : Exercise Goals Re-Evaluation    Row Name 10/21/16 1632 10/29/16 1457 11/11/16 1225 11/12/16 1440 11/25/16 1241     Exercise Goal Re-Evaluation   Exercise Goals Review  Able to understand and use rate of perceived exertion (RPE) scale;Able to check pulse independently;Knowledge and understanding of Target Heart Rate Range (THRR);Understanding of Exercise Prescription;Increase Physical Activity;Increase Strength and Stamina  Increase Physical Activity;Increase Strength and Stamina  Increase Physical Activity;Increase Strength and Stamina;Able to understand and use rate of perceived exertion (RPE) scale;Understanding of Exercise Prescription  Increase Physical Activity;Increase Strength and Stamina;Able to understand and use rate of perceived exertion (RPE) scale;Knowledge and understanding of Target Heart Rate Range (THRR)  Increase Physical Activity;Increase Strength and Stamina;Able to understand and use rate of perceived exertion (RPE) scale   Comments  Reviewed RPE scale, THR and program prescription with pt  today.  Pt voiced understanding and was given a copy of goals to take home.   [name] is tolerating exercise well in his first sessions.  Staff will monitor progression.  [name] is progressing well and has improved overall MET level.  Staff will continue to monitor.  Reviewed home exercise with pt today.  Pt plans to walk for exercise.  Reviewed THR, pulse, RPE, sign and symptoms, NTG use, and when to call 911 or MD.  Also discussed weather  considerations and indoor options.  Pt voiced understanding.  [name] is tolerating exercise well. Staff will review equipment/interval training as his RPE is 11 consistently.     Expected Outcomes  Short: Use RPE daily to regulate intensity.  Long: Follow program prescription in THR.  Short - [name] will attend regularly.  Long - [name] will continue exercise on his own.  Short - [name] wil continue to attend regularly.  Long - [name] will incorporate independent exercise.  Short  - [name] will check his HR while walking at [facility].  Long - [name] will exercise 3-5 days per week independently.  Short - [name] will add intervals or other equipment to his program.  Long - [name] will improve overall MET levels   Row Name 12/11/16 1149             Exercise Goal Re-Evaluation   Exercise Goals Review  Increase Physical Activity;Increase Strength and Stamina;Able to understand and use rate of perceived exertion (RPE) scale       Comments  [name] is trying the XR instead of REL for a new challenge.  Staff will montior progress.       Expected Outcomes  Short - [name] will progress levels on XR Long - Billy will improve overall MET level          Discharge Exercise Prescription (Final Exercise Prescription Changes): Exercise Prescription Changes - 12/11/16 1100      Response to Exercise   Blood Pressure (Admit)  138/74    Blood Pressure (Exercise)  158/84    Blood Pressure (Exit)  124/62    Heart Rate (Admit)  80 bpm    Heart Rate (Exercise)  117 bpm    Heart Rate (Exit)  67  bpm    Rating of Perceived Exertion (Exercise)  15    Symptoms  none    Duration  Continue with 45 min of aerobic exercise without signs/symptoms of physical distress.    Intensity  THRR unchanged      Progression   Progression  Continue to progress workloads to maintain intensity without signs/symptoms of physical distress.    Average METs  3.12      Resistance Training   Training Prescription  Yes    Weight  3 lb    Reps  10-15      Interval Training   Interval Training  No      REL-XR   Level  1    Minutes  15    METs  3      T5 Nustep   Level  3    Minutes  15    METs  4      Home Exercise Plan   Plans to continue exercise at  Home (comment)    Frequency  Add 1 additional day to program exercise sessions.    Initial Home Exercises Provided  11/11/16       Nutrition:  Target Goals: Understanding of nutrition guidelines, daily intake of sodium '1500mg'$ , cholesterol '200mg'$ , calories 30% from fat and 7% or less from saturated fats, daily to have 5 or more servings of fruits and vegetables.  Biometrics: Pre Biometrics - 10/19/16 1406      Pre Biometrics   Height  5' 9.6" (1.768 m)    Weight  206 lb (93.4 kg)    Waist Circumference  41.75 inches    Hip Circumference  39 inches    Waist to Hip Ratio  1.07 %    BMI (Calculated)  29.89    Single Leg Stand  1.66 seconds        Nutrition Therapy Plan and Nutrition Goals: Nutrition Therapy & Goals - 12/09/16 1742      Nutrition Therapy   RD appointment defered  Yes       Nutrition Discharge: Rate Your Plate Scores: Nutrition Assessments - 10/19/16 1224      MEDFICTS Scores   Pre Score  9       Nutrition Goals Re-Evaluation:   Nutrition Goals Discharge (Final Nutrition Goals Re-Evaluation):   Psychosocial: Target Goals: Acknowledge presence or absence of significant depression and/or stress, maximize coping skills, provide positive support system. Participant is able to verbalize types and ability to  use techniques and skills needed for reducing stress and depression.   Initial Review & Psychosocial Screening: Initial Psych Review & Screening - 10/19/16 1226      Initial Review   Current issues with  Current Sleep Concerns;Current Stress Concerns He has issues staying asleep longer than 3 hours a night.    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       Family Dynamics   Good Support System?  Yes wife      Screening Interventions   Interventions  Yes;Encouraged to exercise;Program counselor consult    Expected Outcomes  Short Term goal: Utilizing psychosocial counselor, staff and physician to assist with identification of specific Stressors or current issues interfering with healing process. Setting desired goal for each stressor or current issue identified.;Long Term Goal: Stressors or current issues are controlled or eliminated.;Short Term goal: Identification and review with participant of any Quality of Life or Depression concerns found by scoring the questionnaire.;Long Term goal: The participant improves quality of Life and PHQ9 Scores as seen by post scores and/or verbalization of changes       Quality of Life Scores:  Quality of Life - 10/19/16 1232      Quality of Life Scores   Health/Function Pre  20.93 %    Socioeconomic Pre  21 %    Psych/Spiritual Pre  21 %    Family Pre  21 %    GLOBAL Pre  20.97 %       PHQ-9: Recent Review Flowsheet Data    Depression screen [facility] 2/9 10/19/2016 12/17/2014 07/31/2013   Decreased Interest 1 0 0   Down, Depressed, Hopeless 1 0 0   PHQ - 2 Score 2 0 0   Altered sleeping 2 - -   Tired, decreased energy  3 - -   Change in appetite 2 - -   Feeling bad or failure about yourself  0 - -   Trouble concentrating 1 - -   Moving slowly or fidgety/restless 1 - -   Suicidal thoughts 0 - -   PHQ-9 Score 11 - -   Difficult doing work/chores Somewhat difficult - -     Interpretation of Total Score  Total Score Depression Severity:  1-4 = Minimal depression, 5-9 = Mild depression, 10-14 = Moderate depression, 15-19 = Moderately severe depression, 20-27 = Severe depression   Psychosocial Evaluation and Intervention: Psychosocial Evaluation - 10/21/16 1704  Psychosocial Evaluation & Interventions   Interventions  Encouraged to exercise with the program and follow exercise prescription;Relaxation education;Stress management education    Comments  Counselor met with Mr. Saulter [name]) and his spouse Collie Siad today for initial psychosocial evaluation.  He is a 76 year old who had (3) stents inserted on 8/29 and then a stroke on 9/30 (a few weeks ago).  [name] has a strong support system with a spouse of 45 years and 8 children - (7) of which live locally.  Also their 56 year old grandson lives next door.  [name] has had chronic sleep problems for awhile with approximately (4) hours/night.  Counselor recommended a possible one hour nap in the afternoons to help refresh since he doesn't have problems going to sleep - only interrupted.  [name] and Collie Siad both agree his appetite has improved recently.  He denies a history of depression or anxiety or any current symptoms.  Collie Siad states that Billy's mood has improved since the medical procedures now that he is recovering well.  He struggles with a great deal of dizziness throughout the day and will be speaking to his Dr. about this in the near future.  [name] has some stress with "renters" on their property who don't pay on time including one of their grandsons.  He has goals to increase his stamina and strength and to walk better - with less dizziness.  Staff will be following with  [name] throughout the course of this program.      Expected Outcomes  [name] will benefit from consistent exercise to achieve his stated goals.  The educational and psychoeducational aspects of this program will be helpful as well.  If his sleep does not improve with consistent exercise, [name] will speak with his Dr. and may incorporate an afternoon nap to keep from being overly tired.  [name] will follow with his Dr. about the dizziness that prevents him from walking or doing normal activities.  Staff will follow.    Continue Psychosocial Services   Follow up required by staff       Psychosocial Re-Evaluation: Psychosocial Re-Evaluation    [place] Name 11/12/16 1445 12/09/16 1644           Psychosocial Re-Evaluation   Current issues with  None Identified  -      Comments  [name] states he doesnt really have stress at this time.  Counselor follow up with [facility] reporting he feels stronger in his legs now since coming into this program.  He continues to have some dizziness and reports no one seems to know why. He is scheduled to see his Dr. about this next Friday and is hoping for some answers.  [name] reports he is sleeping better - but when asked how much better - he reported  approx. 4 hours/night - which is what he was getting prior to this program.  [name] says his mood is about the same and he is not experiencing any stress currently.  Counselor commended [place] on his progress in developing more strength in his legs.  He [place] walking in the mall 2x per week in addition to this program.  Counselor commended [place] on consistency and commitment to exercise and improving his health.        Expected Outcomes  -  [name] will see his Dr. about the dizziness to hopefully "get some answers."           Psychosocial Discharge (Final Psychosocial Re-Evaluation): Psychosocial Re-Evaluation - 12/09/16 1644  Psychosocial Re-Evaluation   Comments  Counselor follow up with [facility] reporting he feels stronger in his  legs now since coming into this program.  He continues to have some dizziness and reports no one seems to know why. He is scheduled to see his Dr. about this next Friday and is hoping for some answers.  [name] reports he is sleeping better - but when asked how much better - he reported  approx. 4 hours/night - which is what he was getting prior to this program.  [name] says his mood is about the same and he is not experiencing any stress currently.  Counselor commended [place] on his progress in developing more strength in his legs.  He is walking [place] the mall 2x per week in addition to this program.  Counselor commended [place] on consistency and commitment to exercise and improving his health.      Expected Outcomes  [name] will see his Dr. about the dizziness to hopefully "get some answers."         Vocational Rehabilitation: Provide vocational rehab assistance to qualifying candidates.   Vocational Rehab Evaluation & Intervention: Vocational Rehab - 10/19/16 1234      Initial Vocational Rehab Evaluation & Intervention   Assessment shows need for Vocational Rehabilitation  No       Education: Education Goals: Education classes will be provided on a variety of topics geared toward better understanding of heart health and risk factor modification. Participant will state understanding/return demonstration of topics presented as noted by education test scores.  Learning Barriers/Preferences: Learning Barriers/Preferences - 10/19/16 1232      Learning Barriers/Preferences   Learning Barriers  Hearing;Reading    Learning Preferences  Individual Instruction;Verbal Instruction       Education Topics: General Nutrition Guidelines/Fats and Fiber: -Group instruction provided by verbal, written material, models and posters to present the general guidelines for heart healthy nutrition. Gives an explanation and review of dietary fats and fiber.   Cardiac Rehab from 12/14/2016 in [facility] Cardiac and Pulmonary  Rehab  Date  10/26/16  Educator  PI  Instruction Review Code  1- Verbalizes Understanding      Controlling Sodium/Reading Food Labels: -Group verbal and written material supporting the discussion of sodium use in heart healthy nutrition. Review and explanation with models, verbal and written materials for utilization of the food label.   Cardiac Rehab from 12/14/2016 in [facility] Cardiac and Pulmonary Rehab  Date  11/02/16  Educator  PI  Instruction Review Code  1- Verbalizes Understanding      Exercise Physiology & Risk Factors: - Group verbal and written instruction with models to review the exercise physiology of the cardiovascular system and associated critical values. Details cardiovascular disease risk factors and the goals associated with each risk factor.   Aerobic Exercise & Resistance Training: - Gives group verbal and written discussion on the health impact of inactivity. On the components of aerobic and resistive training programs and the benefits of this training and how to safely progress through these programs.   Cardiac Rehab from 12/14/2016 in [facility] Cardiac and Pulmonary Rehab  Date  11/11/16  Educator  AS  Instruction Review Code  1- Verbalizes Understanding      Flexibility, Balance, General Exercise Guidelines: - Provides group verbal and written instruction on the benefits of flexibility and balance training programs. Provides general exercise guidelines with specific guidelines to those with heart or lung disease. Demonstration and skill practice provided.   Cardiac Rehab from 12/14/2016 in [facility]  Cardiac and Pulmonary Rehab  Date  11/16/16  Educator  [facility]  Instruction Review Code  1- Verbalizes Understanding      Stress Management: - Provides group verbal and written instruction about the health risks of elevated stress, cause of high stress, and healthy ways to reduce stress.   Cardiac Rehab from 12/14/2016 in [facility] Cardiac and Pulmonary Rehab  Date  11/25/16   Educator  [facility]  Instruction Review Code  1- Verbalizes Understanding      Depression: - Provides group verbal and written instruction on the correlation between heart/lung disease and depressed mood, treatment options, and the stigmas associated with seeking treatment.   Cardiac Rehab from 12/14/2016 in [facility] Cardiac and Pulmonary Rehab  Date  10/28/16  Educator  [facility]  Instruction Review Code  1- Verbalizes Understanding      Anatomy & Physiology of the Heart: - Group verbal and written instruction and models provide basic cardiac anatomy and physiology, with the coronary electrical and arterial systems. Review of: AMI, Angina, Valve disease, Heart Failure, Cardiac Arrhythmia, Pacemakers, and the ICD.   Cardiac Rehab from 12/14/2016 in [facility] Cardiac and Pulmonary Rehab  Date  11/23/16  Educator  [facility]  Instruction Review Code  1- Verbalizes Understanding      Cardiac Procedures: - Group verbal and written instruction to review commonly prescribed medications for heart disease. Reviews the medication, class of the drug, and side effects. Includes the steps to properly store meds and maintain the prescription regimen. (beta blockers and nitrates)   Cardiac Medications I: - Group verbal and written instruction to review commonly prescribed medications for heart disease. Reviews the medication, class of the drug, and side effects. Includes the steps to properly store meds and maintain the prescription regimen.   Cardiac Medications II: -Group verbal and written instruction to review commonly prescribed medications for heart disease. Reviews the medication, class of the drug, and side effects. (all other drug classes)    Go Sex-Intimacy & Heart Disease, Get SMART - Goal Setting: - Group verbal and written instruction through game format to discuss heart disease and the return to sexual intimacy. Provides group verbal and written material to discuss and apply goal setting through the  application of the S.M.A.R.T. Method.   Other Matters of the Heart: - Provides group verbal, written materials and models to describe Heart Failure, Angina, Valve Disease, Peripheral Artery Disease, and Diabetes in the realm of heart disease. Includes description of the disease process and treatment options available to the cardiac patient.   Cardiac Rehab from 12/14/2016 in [facility] Cardiac and Pulmonary Rehab  Date  11/23/16  Educator  [facility]  Instruction Review Code  1- Verbalizes Understanding      Exercise & Equipment Safety: - Individual verbal instruction and demonstration of equipment use and safety with use of the equipment.   Cardiac Rehab from 12/14/2016 in [facility] Cardiac and Pulmonary Rehab  Date  10/19/16  Educator  [facility]  Instruction Review Code  1- Verbalizes Understanding      Infection Prevention: - Provides verbal and written material to individual with discussion of infection control including proper hand washing and proper equipment cleaning during exercise session.   Cardiac Rehab from 12/14/2016 in [facility] Cardiac and Pulmonary Rehab  Date  10/19/16  Educator  [facility]  Instruction Review Code  1- Verbalizes Understanding      Falls Prevention: - Provides verbal and written material to individual with discussion of falls prevention and safety.   Cardiac Rehab from 12/14/2016  in [facility] Cardiac and Pulmonary Rehab  Date  10/19/16  Educator  [facility]  Instruction Review Code  1- Verbalizes Understanding      Diabetes: - Individual verbal and written instruction to review signs/symptoms of diabetes, desired ranges of glucose level fasting, after meals and with exercise. Acknowledge that pre and post exercise glucose checks will be done for 3 sessions at entry of program.   Other: -Provides group and verbal instruction on various topics (see comments)    Knowledge Questionnaire Score: Knowledge Questionnaire Score - 10/19/16 1233      Knowledge Questionnaire Score   Pre Score  0  Patient marked unsure for most and did not answer the rest. We reviewed the answers together as it is hard for him to read and comprehend       Core Components/Risk Factors/Patient Goals at Admission: Personal Goals and Risk Factors at Admission - 10/19/16 1220      Core Components/Risk Factors/Patient Goals on Admission    Weight Management  Yes;Weight Loss    Intervention  Weight Management: Develop a combined nutrition and exercise program designed to reach desired caloric intake, while maintaining appropriate intake of nutrient and fiber, sodium and fats, and appropriate energy expenditure required for the weight goal.;Weight Management: Provide education and appropriate resources to help participant work on and attain dietary goals.    Admit Weight  206 lb (93.4 kg)    Goal Weight: Short Term  200 lb (90.7 kg)    Goal Weight: Long Term  196 lb (88.9 kg)    Expected Outcomes  Long Term: Adherence to nutrition and physical activity/exercise program aimed toward attainment of established weight goal;Short Term: Continue to assess and modify interventions until short term weight is achieved;Weight Loss: Understanding of general recommendations for a balanced deficit meal plan, which promotes 1-2 lb weight loss per week and includes a negative energy balance of [phone] kcal/d;Understanding recommendations for meals to include 15-35% energy as protein, 25-35% energy from fat, 35-60% energy from carbohydrates, less than '200mg'$  of dietary cholesterol, 20-35 gm of total fiber daily;Understanding of distribution of calorie intake throughout the day with the consumption of 4-5 meals/snacks    Hypertension  Yes    Intervention  Provide education on lifestyle modifcations including regular physical activity/exercise, weight management, moderate sodium restriction and increased consumption of fresh fruit, vegetables, and low fat dairy, alcohol moderation, and smoking cessation.;Monitor prescription use  compliance.    Expected Outcomes  Short Term: Continued assessment and intervention until BP is < 140/68m HG in hypertensive participants. < 130/860mHG in hypertensive participants with diabetes, heart failure or chronic kidney disease.;Long Term: Maintenance of blood pressure at goal levels.    Lipids  Yes    Intervention  Provide education and support for participant on nutrition & aerobic/resistive exercise along with prescribed medications to achieve LDL '70mg'$ , HDL >'40mg'$ .    Expected Outcomes  Short Term: Participant states understanding of desired cholesterol values and is compliant with medications prescribed. Participant is following exercise prescription and nutrition guidelines.;Long Term: Cholesterol controlled with medications as prescribed, with individualized exercise RX and with personalized nutrition plan. Value goals: LDL < '70mg'$ , HDL > 40 mg.    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 Teach and support self-help strategies.;Refer participants experiencing significant psychosocial distress  to appropriate mental health specialists for further evaluation and treatment. When possible, include family members and significant others in education/counseling sessions.    Expected Outcomes  Short Term: Participant demonstrates changes in health-related behavior, relaxation and other stress management skills, ability to obtain effective social support, and compliance with psychotropic medications if prescribed.;Long Term: Emotional wellbeing is indicated by absence of clinically significant psychosocial distress or social isolation.       Core Components/Risk Factors/Patient Goals Review:  Goals and Risk Factor Review    Row Name 11/12/16 1442 12/09/16 1743           Core  Components/Risk Factors/Patient Goals Review   Personal Goals Review  Hypertension;Stress  Hypertension;Other      Review  [name] has been eating more vegetables and grilled chicken.  His BP has been good during HT sessions.  He states he just gave some land to his kids to "take the burden off me."  He doesnt really have any stress.  [name] states his shoulder has been feeling better. He is [place] 2 [place]  His BP has been in nomral range during class.      Expected Outcomes  Short - [name] will continue to incorporate more healthy dietary habits.  Long - [name] will maintain heart health.  Short - [name] will continue to attend regularly.  Long - [name] will continue to improve MET level and ROM for shoulder.           Core Components/Risk Factors/Patient Goals at Discharge (Final Review):  Goals and Risk Factor Review - 12/09/16 1743      Core Components/Risk Factors/Patient Goals Review   Personal Goals Review  Hypertension;Other    Review  [name] states his shoulder has been feeling better. He is walking 2 [place]  His BP has been in nomral range during class.    Expected Outcomes  Short - [name] will continue to attend regularly.  Long - [name] will continue to improve MET level and ROM for shoulder.         ITP Comments: ITP Comments    Row Name 10/19/16 1214 10/21/16 0837 11/18/16 0602 12/16/16 0626     ITP Comments  Med Review completed. Initial ITP created. Diagnosis can be found in [facility] 09/09/16  30 day review. Continue with ITP unless directed changes per Medical Director Review.   30 day review. Continue with ITP unless directed changes per Medical Director review.   30 day review. Continue with ITP unless directed changes per Medical Director review.        Comments:

## 2016-12-16 NOTE — Progress Notes (Signed)
Daily Session Note  Patient Details  Name: GHALI MORISSETTE MRN: [id number] Date of Birth: 1940-05-18 Referring Provider:     Cardiac Rehab from 10/19/2016 in [facility] Cardiac and Pulmonary Rehab  Referring Provider  [name], [name] MD      Encounter Date: 12/16/2016  Check In: Session Check In - 12/16/16 1633      Check-In   Location  [facility]-Cardiac & Pulmonary Rehab    Staff Present  Renita Papa, RN Vickki Hearing, BA, ACSM CEP, Exercise Physiologist;Carroll Enterkin, RN, BSN    Supervising physician immediately available to respond to emergencies  See telemetry face sheet for immediately available ER MD    Medication changes reported      No    Fall or balance concerns reported     No    Warm-up and Cool-down  Performed on first and last piece of equipment    Resistance Training Performed  Yes    VAD Patient?  No      Pain Assessment   Currently in Pain?  No/denies          Social History   Tobacco Use  Smoking Status Never Smoker  Smokeless Tobacco Never Used    Goals Met:  Independence with exercise equipment Exercise tolerated well No report of cardiac concerns or symptoms Strength training completed today  Goals Unmet:  Not Applicable  Comments: Pt able to follow exercise prescription today without complaint.  Will continue to monitor for progression.    Dr. Emily Filbert is Medical Director for [place] and [facility].

## 2016-12-17 ENCOUNTER — Encounter: Payer: Medicare Other | Admitting: *Deleted

## 2016-12-17 DIAGNOSIS — Z955 Presence of coronary angioplasty implant and graft: Secondary | ICD-10-CM

## 2016-12-17 DIAGNOSIS — I2510 Atherosclerotic heart disease of native coronary artery without angina pectoris: Secondary | ICD-10-CM | POA: Diagnosis not present

## 2016-12-17 NOTE — Progress Notes (Signed)
Daily Session Note  Patient Details  Name: Colin Lopez MRN: [id number] Date of Birth: Feb 04, 1940 Referring Provider:     Cardiac Rehab from 10/19/2016 in [facility] Cardiac and Pulmonary Rehab  Referring Provider  [name], [name] MD      Encounter Date: 12/17/2016  Check In: Session Check In - 12/17/16 1653      Check-In   Location  [facility]-Cardiac & Pulmonary Rehab    Staff Present  Earlean Shawl, BS, ACSM CEP, Exercise Physiologist;Meredith Sherryll Burger, RN BSN;Joseph Flavia Shipper    Supervising physician immediately available to respond to emergencies  See telemetry face sheet for immediately available ER MD    Medication changes reported      No    Fall or balance concerns reported     No    Warm-up and Cool-down  Performed on first and last piece of equipment    Resistance Training Performed  Yes    VAD Patient?  No      Pain Assessment   Currently in Pain?  No/denies    Multiple Pain Sites  No          Social History   Tobacco Use  Smoking Status Never Smoker  Smokeless Tobacco Never Used    Goals Met:  Independence with exercise equipment Exercise tolerated well No report of cardiac concerns or symptoms Strength training completed today  Goals Unmet:  Not Applicable  Comments: Pt able to follow exercise prescription today without complaint.  Will continue to monitor for progression.    Dr. Emily Filbert is Medical Director for [place] and [facility].

## 2016-12-23 ENCOUNTER — Ambulatory Visit: Payer: Self-pay | Admitting: Cardiology

## 2016-12-28 ENCOUNTER — Encounter: Payer: Medicare Other | Admitting: *Deleted

## 2016-12-28 DIAGNOSIS — I2510 Atherosclerotic heart disease of native coronary artery without angina pectoris: Secondary | ICD-10-CM | POA: Diagnosis not present

## 2016-12-28 DIAGNOSIS — Z955 Presence of coronary angioplasty implant and graft: Secondary | ICD-10-CM | POA: Diagnosis not present

## 2016-12-28 NOTE — Progress Notes (Signed)
Daily Session Note  Patient Details  Name: PIETRO BONURA MRN: [id number] Date of Birth: 07/11/1940 Referring Provider:     Cardiac Rehab from 10/19/2016 in [facility] Cardiac and Pulmonary Rehab  Referring Provider  [name], [name] MD      Encounter Date: 12/28/2016  Check In: Session Check In - 12/28/16 1626      Check-In   Location  [facility]-Cardiac & Pulmonary Rehab    Staff Present  Earlean Shawl, BS, ACSM CEP, Exercise Physiologist;Other;Carroll Enterkin, RN, BSN    Supervising physician immediately available to respond to emergencies  See telemetry face sheet for immediately available ER MD    Medication changes reported      No    Fall or balance concerns reported     No    Warm-up and Cool-down  Performed on first and last piece of equipment    Resistance Training Performed  Yes    VAD Patient?  No      Pain Assessment   Currently in Pain?  No/denies    Multiple Pain Sites  No          Social History   Tobacco Use  Smoking Status Never Smoker  Smokeless Tobacco Never Used    Goals Met:  Independence with exercise equipment Exercise tolerated well No report of cardiac concerns or symptoms Strength training completed today  Goals Unmet:  Not Applicable  Comments: Pt able to follow exercise prescription today without complaint.  Will continue to monitor for progression.    Dr. Emily Filbert is Medical Director for [place] and [facility].

## 2016-12-30 ENCOUNTER — Ambulatory Visit (INDEPENDENT_AMBULATORY_CARE_PROVIDER_SITE_OTHER)
Admission: RE | Admit: 2016-12-30 | Discharge: 2016-12-30 | Disposition: A | Discharge status: Home / Self Care | Payer: Medicare Other | Source: Ambulatory Visit | Attending: Internal Medicine | Admitting: Internal Medicine

## 2016-12-30 ENCOUNTER — Ambulatory Visit: Payer: Medicare Other | Admitting: Internal Medicine

## 2016-12-30 ENCOUNTER — Encounter: Payer: Self-pay | Admitting: Internal Medicine

## 2016-12-30 VITALS — BP 130/64 | HR 60 | Ht 73.0 in | Wt 207.0 lb

## 2016-12-30 DIAGNOSIS — R911 Solitary pulmonary nodule: Secondary | ICD-10-CM

## 2016-12-30 DIAGNOSIS — R05 Cough: Secondary | ICD-10-CM

## 2016-12-30 DIAGNOSIS — R058 Other specified cough: Secondary | ICD-10-CM

## 2016-12-30 NOTE — Patient Instructions (Addendum)
We will call you to schedule the CT chest in 04/2017   Late add: move up ct to prior to 02/2017   Consider trial off acei

## 2016-12-30 NOTE — Progress Notes (Signed)
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 Pulmonary Consult    Referred by Dr. Danise Mina for eval of lung mass. Pt states had PNA recently and today his breathing is at baseline for him. He states before his PNA he painted a car and he wore a mask, but did not realize there was a hole in it. 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   onset of symptoms was abrupt one day p painting car with new acetone based paint then w/in 24h L posterolateral pleuritic cp/nasal congestion with epistaxis and hemoptysis >  eval 04/20/16 with cxr /CT? 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 Follow-up    CXR repeated today. He states "my breathing is fine". No new co's today. He is down 2 lbs since last ov 05/21/16.   ha is worse in pm's variable seems worse in pm  L cp has not recurred/ no sob or cough rec bystolic 10 mg daily until you see your primary care doctor w/in next couple of weeks Please  see patient coordinator before you leave today  to schedule PET scan - neg      09/30/2016  f/u ov/Dio Giller re: LLL SPN/ neg pet  Chief Complaint  Patient presents with  . Follow-up    Breathing is doing well and no new co's today.   Not limited by breathing from desired activities   rec Return for cxr    12/30/2016  f/u ov/Rufino Staup re: "worse cold I've ever had" p flu/pna shots in oct 2018  Chief Complaint  Patient presents with  . Follow-up    Pt c/o cough, non prod that started after he had flu and pna vaccine 10/2016.  Cough does occ wake him up in the night.   coughing fits can be severe day > noct / paroxysmal s pattern and completely dry but feels like he's "finally getting over his cold"  Not limited by breathing from desired activities   Sleeping ok   No obvious day to day or daytime variability or assoc excess/ purulent sputum or mucus plugs or hemoptysis or cp or chest tightness, subjective wheeze or overt sinus or hb symptoms. No unusual exposure hx or h/o childhood pna/ asthma or knowledge of premature  birth.  Sleeping ok flat without nocturnal  or early am exacerbation  of respiratory  c/o's or need for noct saba. Also denies any obvious fluctuation of symptoms with weather or environmental changes or other aggravating or alleviating factors except as outlined above   Current Allergies, Complete Past Medical History, Past Surgical History, Family History, and Social History were reviewed in [employer] record.  ROS  The following are not active complaints unless bolded Hoarseness, sore throat, dysphagia, dental problems, itching, sneezing,  nasal congestion or discharge of excess mucus or purulent secretions, ear ache,   fever, chills, sweats, unintended wt loss or wt gain, classically pleuritic or exertional cp,  orthopnea pnd or leg swelling, presyncope, palpitations, abdominal pain, anorexia, nausea, vomiting, diarrhea  or change in bowel habits or  change in bladder habits, change in stools or change in urine, dysuria, hematuria,  rash, arthralgias, visual complaints, headache, numbness, weakness or ataxia or problems with walking or coordination,  change in mood/affect or memory.        Current Meds  Medication Sig  . acetaminophen (TYLENOL) 500 MG tablet Take 1,000 mg by mouth every 6 (six) hours as needed for headache (pain).  [name] aspirin EC 81 MG tablet Take 1 tablet (81 mg total) by mouth daily.  [name] atorvastatin (LIPITOR) 40 MG tablet Take 1 tablet (40 mg total) by mouth at bedtime.  . clopidogrel (PLAVIX) 75 MG tablet Take 1 tablet (75 mg total) by mouth daily with breakfast.  . fenofibrate 160 MG tablet Take 1 tablet (160 mg total) by mouth daily.  Javier Docker Oil 1000 MG CAPS Take 1,000 mg by mouth at bedtime.  [name] lisinopril (PRINIVIL,ZESTRIL) 10 MG tablet TAKE 1 TABLET BY MOUTH ONCE DAILY  . nebivolol (BYSTOLIC) 10 MG tablet Take 1 tablet (10 mg total) by mouth daily. (Patient taking differently: Take 10 mg by mouth at bedtime. )  . nitroGLYCERIN (NITROSTAT) 0.4 MG SL tablet Place 1 tablet (0.4 mg total) under the tongue every 5 (five) minutes as needed for chest pain.  [name] OVER THE COUNTER MEDICATION Apply 1 application topically daily as needed (pain). Horse Liniment otc cream  . pantoprazole (PROTONIX) 40 MG tablet Take 1 tablet (40 mg total) by mouth daily.                      Objective:   Physical Exam  amb pleasant wm nad   12/30/2016      207  06/19/2016          209  05/21/16 211 lb (95.7 kg)  05/05/16 214 lb (97.1 kg)  04/20/16 212 lb 4 oz (96.3 kg)    Vital signs reviewed - Note on arrival 02 sats  96% on RA          HEENT: nl  turbinates bilaterally, and oropharynx. Nl external ear canals without cough reflex - edentulous    NECK :  without JVD/Nodes/TM/ nl carotid upstrokes bilaterally   LUNGS: no acc muscle use,  Nl contour chest which is clear to A and P bilaterally without cough on insp or exp  maneuvers   CV:  RRR  no s3 or murmur or increase in P2, and no edema   ABD:  soft and nontender with nl inspiratory excursion in the supine position. No bruits or organomegaly appreciated, bowel sounds nl  MS:  Nl gait/ ext warm without deformities, calf tenderness, cyanosis or clubbing No obvious joint restrictions   SKIN:  warm and dry without lesions    NEURO:  alert, approp, nl sensorium with  no motor or cerebellar deficits apparent.     CXR PA and Lateral:   12/30/2016 :    I personally reviewed images and agree with radiology impression as follows:    Persistent superior segment left lower lobe pulmonary nodule.         Assessment:

## 2016-12-31 ENCOUNTER — Encounter: Payer: Self-pay | Admitting: Internal Medicine

## 2016-12-31 DIAGNOSIS — R05 Cough: Secondary | ICD-10-CM | POA: Insufficient documentation

## 2016-12-31 DIAGNOSIS — R058 Other specified cough: Secondary | ICD-10-CM | POA: Insufficient documentation

## 2016-12-31 NOTE — Progress Notes (Signed)
LMTCB

## 2016-12-31 NOTE — Assessment & Plan Note (Addendum)
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   These are the same pts (now being labeled as having "irritable larynx syndrome" by some cough centers) who not infrequently have a history of having failed to tolerate ace inhibitors,  dry powder inhalers or biphosphonates or report having atypical/extraesophageal reflux symptoms that don't respond to standard doses of PPI  and are easily confused as having aecopd or asthma flares by even experienced allergists/ pulmonologists (myself included).    No point in working this up further until off acei x 6 weeks but since he feels "it's getting better" then fine to defer to Cards whether to consider ARB trial and in meantime continue rx for gerd  I had an extended discussion with the patient reviewing all relevant studies completed to date and  lasting 15 to 20 minutes of a 25 minute visit    Each maintenance medication was reviewed in detail including most importantly the difference between maintenance and prns and under what circumstances the prns are to be triggered using an action plan format that is not reflected in the computer generated alphabetically organized AVS.    Please see AVS for specific instructions unique to this visit that I personally wrote and verbalized to the the pt in detail and then reviewed with pt  by my nurse highlighting any  changes in therapy recommended at today's visit to their plan of care.

## 2016-12-31 NOTE — Assessment & Plan Note (Signed)
Never smoker  CTa Chest  04/21/16 1. No evidence of acute pulmonary embolus. 2. Superior segment left lower lobe rounded 2.4 cm opacity is nonspecific and corresponds to the radiographic finding yesterday. This could represent a round pneumonia, and there is a small associated left pleural effusion.  - cxr 06/19/2016 more dense, localized > rec PET 06/30/2016 >>> althhough there is a persistent subpleural nodule in the left lower lobe, this demonstrates only low level hypermetabolic activity, predominately along the pleural surface. A benign etiology such as rounded atelectasis is favored, although low grade neoplasm cannot be completely excluded > rec ov with cxr in 3 months  - cxr 09/30/2016 c/w regression - only seen on PA view with rib superimposed making it look denser - 12/30/2016 no change > rec CT by 02/12/17   Most likely this is just an area of organized pna/scarring from prior pna in sup segment but I share radiology's concern and rec ct in the next 6 weeks to get an exact trend in size  Discussed in detail all the  indications, usual  risks and alternatives  relative to the benefits with patient who agrees to proceed with w/u as outlined.

## 2017-01-01 ENCOUNTER — Telehealth: Payer: Self-pay | Admitting: Internal Medicine

## 2017-01-01 NOTE — Telephone Encounter (Signed)
Notes recorded by Rosana Berger, CMA on 01/01/2017 at 11:51 AM EST LMTCB ------  Notes recorded by Rosana Berger, CMA on 12/31/2016 at 9:40 AM EST LMTCB ------  Notes recorded by Tanda Rockers, MD on 12/31/2016 at 8:33 AM EST Call pt: Reviewed cxr again with comparisons and with radiology and decided best approach is to go ahead With CT chest w/o contrast some time in the next 6 weeks - ok to wait until after the first of the year for sure   Called and spoke with patient today regarding results and recommendations.  Informed the patient of results today.The patient verbalized understanding and denied any questions or concerns at this time. Nothing further needed.

## 2017-01-01 NOTE — Progress Notes (Signed)
LMTCB

## 2017-01-06 ENCOUNTER — Encounter: Payer: Medicare Other | Admitting: *Deleted

## 2017-01-06 VITALS — Ht 73.0 in | Wt 208.0 lb

## 2017-01-06 DIAGNOSIS — Z955 Presence of coronary angioplasty implant and graft: Secondary | ICD-10-CM

## 2017-01-06 DIAGNOSIS — I2510 Atherosclerotic heart disease of native coronary artery without angina pectoris: Secondary | ICD-10-CM | POA: Diagnosis not present

## 2017-01-06 NOTE — Progress Notes (Signed)
Daily Session Note  Patient Details  Name: Colin Lopez MRN: [id number] Date of Birth: [birth year] Referring Provider:     Cardiac Rehab from 10/19/2016 in [facility]  Referring Provider  [name], [name] MD      Encounter Date: 01/06/2017  Check In: Session Check In - 01/06/17 1702      Check-In   Location  [facility] Re[facility]    Staff Present  Renita Papa, RN Vickki Hearing, BA, ACSM CEP, Exercise Physiologist;Carroll Enterkin, RN, BSN    Supervising physician immediately available to respond to emergencies  See telemetry face sheet for immediately available ER MD    Medication changes reported      No    Fall or balance concerns reported     No    Warm-up and Cool-down  Performed on first and last piece of equipment    Resistance Training Performed  Yes    VAD Patient?  No      Pain Assessment   Currently in Pain?  No/denies        Exercise Prescription Changes - 01/06/17 1300      Response to Exercise   Blood Pressure (Admit)  114/68    Blood Pressure (Exercise)  132/82    Blood Pressure (Exit)  126/70    Heart Rate (Admit)  60 bpm    Heart Rate (Exercise)  110 bpm    Heart Rate (Exit)  83 bpm    Rating of Perceived Exertion (Exercise)  12    Symptoms  none    Duration  Continue with 45 min of aerobic exercise without signs/symptoms of physical distress.    Intensity  THRR unchanged      Progression   Progression  Continue to progress workloads to maintain intensity without signs/symptoms of physical distress.    Average METs  2      Resistance Training   Training Prescription  Yes    Weight  5 lb    Reps  10-15      Interval Training   Interval Training  No      REL-XR   Level  1    Minutes  15    METs  1.5      Track   Laps  30    Minutes  15    METs  2.38      Home Exercise Plan   Plans to continue exercise at  Home (comment)    Frequency  Add 1 additional day to program exercise sessions.    Initial  Home Exercises Provided  11/11/16       Social History   Tobacco Use  Smoking Status Never Smoker  Smokeless Tobacco Never Used    Goals Met:  Independence with exercise equipment Exercise tolerated well Personal goals reviewed No report of cardiac concerns or symptoms Strength training completed today  Goals Unmet:  Not Applicable  Comments: Pt able to follow exercise prescription today without complaint.  Will continue to monitor for progression.    Dr. Emily Filbert is Medical Director for [facility] and [facility].

## 2017-01-07 DIAGNOSIS — I2510 Atherosclerotic heart disease of native coronary artery without angina pectoris: Secondary | ICD-10-CM | POA: Diagnosis not present

## 2017-01-07 DIAGNOSIS — Z955 Presence of coronary angioplasty implant and graft: Secondary | ICD-10-CM

## 2017-01-07 NOTE — Progress Notes (Signed)
Daily Session Note  Patient Details  Name: Colin Lopez MRN: [id number] Date of Birth: 10/11/1940 Referring Provider:     Cardiac Rehab from 10/19/2016 in [facility]  Referring Provider  [name], [name] MD      Encounter Date: 01/07/2017  Check In: Session Check In - 01/07/17 1615      Check-In   Location  [facility]    Staff Present  Justin Mend RCP,RRT,BSRT;Meredith Sherryll Burger, RN BSN;Laureen Janell Quiet, RRT, Respiratory Therapist    Supervising physician immediately available to respond to emergencies  See telemetry face sheet for immediately available ER MD    Medication changes reported      No    Fall or balance concerns reported     No    Warm-up and Cool-down  Performed on first and last piece of equipment    Resistance Training Performed  Yes    VAD Patient?  No      Pain Assessment   Currently in Pain?  No/denies          Social History   Tobacco Use  Smoking Status Never Smoker  Smokeless Tobacco Never Used    Goals Met:  Independence with exercise equipment Exercise tolerated well Personal goals reviewed No report of cardiac concerns or symptoms Strength training completed today  Goals Unmet:  Not Applicable  Comments: Pt able to follow exercise prescription today without complaint.  Will continue to monitor for progression.   Dr. Emily Filbert is Medical Director for [facility] and [facility].

## 2017-01-13 ENCOUNTER — Encounter: Payer: Self-pay | Admitting: *Deleted

## 2017-01-13 ENCOUNTER — Encounter: Payer: Medicare Other | Attending: Cardiology | Admitting: *Deleted

## 2017-01-13 DIAGNOSIS — Z955 Presence of coronary angioplasty implant and graft: Secondary | ICD-10-CM | POA: Diagnosis not present

## 2017-01-13 DIAGNOSIS — I2510 Atherosclerotic heart disease of native coronary artery without angina pectoris: Secondary | ICD-10-CM | POA: Diagnosis not present

## 2017-01-13 NOTE — Progress Notes (Signed)
Daily Session Note  Patient Details  Name: Colin Lopez MRN: [id number] Date of Birth: 1940/10/09 Referring Provider:     Cardiac Rehab from 10/19/2016 in [facility]  Referring Provider  [name], [name] MD      Encounter Date: 01/13/2017  Check In: Session Check In - 01/13/17 1703      Check-In   Location  [facility]    Staff Present  Renita Papa, RN Vickki Hearing, BA, ACSM CEP, Exercise Physiologist;Carroll Enterkin, RN, BSN    Supervising physician immediately available to respond to emergencies  See telemetry face sheet for immediately available ER MD    Medication changes reported      No    Fall or balance concerns reported     No    Warm-up and Cool-down  Performed on first and last piece of equipment    Resistance Training Performed  Yes    VAD Patient?  No      Pain Assessment   Currently in Pain?  No/denies          Social History   Tobacco Use  Smoking Status Never Smoker  Smokeless Tobacco Never Used    Goals Met:  Independence with exercise equipment Exercise tolerated well No report of cardiac concerns or symptoms Strength training completed today  Goals Unmet:  Not Applicable  Comments:  Colin Lopez graduated today from cardiac rehab with 36 sessions completed.  Details of the patient's exercise prescription and what He needs to do in order to continue the prescription and progress were discussed with patient.  Patient was given a copy of prescription and goals.  Patient verbalized understanding.  [name] plans to continue to exercise at home with his equipment and walking.    Dr. Emily Filbert is Medical Director for [facility] and [facility].

## 2017-01-13 NOTE — Progress Notes (Signed)
Cardiac Individual Treatment Plan  Patient Details  Name: Colin Lopez MRN: [id number] Date of Birth: 12-22-1940 Referring Provider:     Cardiac Rehab from 10/19/2016 in [facility] Cardiac and Pulmonary Rehab  Referring Provider  Colin Lopez      Initial Encounter Date:    Cardiac Rehab from 10/19/2016 in [facility] Cardiac and Pulmonary Rehab  Date  10/19/16  Referring Provider  Colin Lopez      Visit Diagnosis: Status post coronary artery stent placement  Patient's Home Medications on Admission:  Current Outpatient Medications:  .  acetaminophen (TYLENOL) 500 MG tablet, Take 1,000 mg by mouth every 6 (six) hours as needed for headache (pain)., Disp: , Rfl:  .  aspirin EC 81 MG tablet, Take 1 tablet (81 mg total) by mouth daily., Disp: 90 tablet, Rfl: 3 .  atorvastatin (LIPITOR) 40 MG tablet, Take 1 tablet (40 mg total) by mouth at bedtime., Disp: 30 tablet, Rfl: 4 .  clopidogrel (PLAVIX) 75 MG tablet, Take 1 tablet (75 mg total) by mouth daily with breakfast., Disp: 90 tablet, Rfl: 2 .  fenofibrate 160 MG tablet, Take 1 tablet (160 mg total) by mouth daily., Disp: 30 tablet, Rfl: 3 .  Krill Oil 1000 MG CAPS, Take 1,000 mg by mouth at bedtime., Disp: , Rfl:  .  lisinopril (PRINIVIL,ZESTRIL) 10 MG tablet, TAKE 1 TABLET BY MOUTH ONCE DAILY, Disp: 90 tablet, Rfl: 1 .  nebivolol (BYSTOLIC) 10 MG tablet, Take 1 tablet (10 mg total) by mouth daily. (Patient taking differently: Take 10 mg by mouth at bedtime. ), Disp: 90 tablet, Rfl: 3 .  nitroGLYCERIN (NITROSTAT) 0.4 MG SL tablet, Place 1 tablet (0.4 mg total) under the tongue every 5 (five) minutes as needed for chest pain., Disp: 25 tablet, Rfl: 3 .  OVER THE COUNTER MEDICATION, Apply 1 application topically daily as needed (pain). Horse Liniment otc cream, Disp: , Rfl:  .  pantoprazole (PROTONIX) 40 MG tablet, Take 1 tablet (40 mg total) by mouth daily., Disp: 30 tablet, Rfl: 11  Past Medical History: Past Medical History:  Diagnosis  Date  . CAD (coronary artery disease), native coronary artery    09/09/16 PCI/DES x3 to mRCA, OM1 and dLcx/OM2.   . Coronary disease    Status post stenting of the left circumflex coronary in 2009 with a bare-metal stent (with a 3.5x74m Liberte stent)  . Dyslipidemia   . 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 Headache   . Hyperlipidemia   . Hypertension   . [place]STEMI (non-ST elevated [place] infarction) [place]   BMS CFX  . TIA (transient ischemic attack)    history of tia    Tobacco Use: Social History   Tobacco Use  Smoking Status Never Smoker  Smokeless Tobacco Never Used    Labs: Recent Review Flowsheet Data    Labs for ITP Cardiac and Pulmonary Rehab Latest Ref Rng & Units 05/08/2014 12/17/2014 09/03/2016 10/12/2016 11/13/2016   Cholestrol 0 - 200 mg/dL - 283(H) 202(H) 134 138   LDLCALC 0 - 99 mg/dL - - 99 UNABLE TO CALCULATE IF TRIGLYCERIDE OVER 400 mg/dL 73   LDLDIRECT mg/dL - 74.0 - - -   HDL >39.00 mg/dL - 27.90(L) 30(L) 21(L) 26.70(L)   Trlycerides 0.0 - 149.0 mg/dL - 1196.0 Triglyceride is over 400; calculations on Lipids are invalid.(H) 367(H) 505(H) 195.0(H)   Hemoglobin A1c 4.8 - 5.6 % 5.6 5.7 - 5.1 -       Exercise Target Goals:  Exercise Program Goal: Individual exercise prescription set with THRR, safety & activity barriers. Participant demonstrates ability to understand and report RPE using BORG scale, to self-measure pulse accurately, and to acknowledge the importance of the exercise prescription.  Exercise Prescription Goal: Starting with aerobic activity 30 plus minutes a day, 3 days per week for initial exercise prescription. Provide home exercise prescription and guidelines that participant acknowledges understanding prior to discharge.  Activity Barriers & Risk Stratification: Activity Barriers & Cardiac Risk Stratification - 10/19/16 1236      Activity Barriers & Cardiac Risk Stratification   Activity Barriers  Joint Problems;Arthritis;Muscular  Weakness;Shortness of Breath;Back Problems;Deconditioning;Balance Concerns;History of Falls Colin Lopez's knees and lower back cause issues for him    Cardiac Risk Stratification  High       6 Minute Walk: 6 Minute Walk    Row Name 10/19/16 1358 01/06/17 1710       6 Minute Walk   Phase  Initial  Discharge    Distance  1288 feet  1758 feet    Walk Time  6 minutes  6 minutes    # of Rest Breaks  0  0    MPH  2.44  3.33    METS  2.78  3.56    RPE  11  13    Perceived Dyspnea   1  3    VO2 Peak  9.71  -    Symptoms  Yes (comment)  Yes (comment)    Comments  dizziness 2/10, SOB  Dizziness    Resting HR  6 bpm  61 bpm    Resting BP  146/62  132/64    Resting Oxygen Saturation   97 %  98 %    Exercise Oxygen Saturation  during 6 min walk  98 %  98 %    Max Ex. HR  114 bpm  108 bpm    Max Ex. BP  154/74  164/66    2 Minute Post BP  128/64  -       Oxygen Initial Assessment:   Oxygen Re-Evaluation:   Oxygen Discharge (Final Oxygen Re-Evaluation):   Initial Exercise Prescription: Initial Exercise Prescription - 10/19/16 1400      Date of Initial Exercise RX and Referring Provider   Date  10/19/16    Referring Provider  Colin Lopez      Recumbant Elliptical   Level  1    RPM  50    Minutes  15    METs  2.5      T5 Nustep   Level  2    SPM  80    Minutes  15    METs  2.5      Track   Laps  32    Minutes  15    METs  2.45      Prescription Details   Frequency (times per week)  3    Duration  Progress to 45 minutes of aerobic exercise without signs/symptoms of physical distress      Intensity   THRR 40-80% of Max Heartrate  89-126    Ratings of Perceived Exertion  11-13    Perceived Dyspnea  0-4      Progression   Progression  Continue to progress workloads to maintain intensity without signs/symptoms of physical distress.      Resistance Training   Training Prescription  Yes    Weight  3 lbs    Reps  10-15  Perform Capillary Blood Glucose  checks as needed.  Exercise Prescription Changes: Exercise Prescription Changes    Row Name 10/19/16 1300 10/29/16 1400 11/11/16 1200 11/12/16 1400 11/25/16 1200     Response to Exercise   Blood Pressure (Admit)  146/62  116/68  130/70  -  134/72   Blood Pressure (Exercise)  154/74  122/54  128/60  -  152/78   Blood Pressure (Exit)  128/64  110/60  118/54  -  114/66   Heart Rate (Admit)  54 bpm  80 bpm  75 bpm  -  45 bpm   Heart Rate (Exercise)  108 bpm  92 bpm  101 bpm  -  111 bpm   Heart Rate (Exit)  52 bpm  73 bpm  74 bpm  -  60 bpm   Oxygen Saturation (Admit)  97 %  -  -  -  -   Oxygen Saturation (Exercise)  98 %  -  -  -  -   Rating of Perceived Exertion (Exercise)  _0 -  11   Perceived Dyspnea (Exercise)  1  -  -  -  -   Symptoms  dizziness (2/10) and SOB  none  noone  -  none   Comments  walk test results  -  -  -  -   Duration  -  Progress to 45 minutes of aerobic exercise without signs/symptoms of physical distress  Progress to 45 minutes of aerobic exercise without signs/symptoms of physical distress  -  Continue with 45 min of aerobic exercise without signs/symptoms of physical distress.   Intensity  -  THRR unchanged  THRR unchanged  -  THRR unchanged     Progression   Progression  -  Continue to progress workloads to maintain intensity without signs/symptoms of physical distress.  Continue to progress workloads to maintain intensity without signs/symptoms of physical distress.  -  Continue to progress workloads to maintain intensity without signs/symptoms of physical distress.   Average METs  -  2.1  2.99  -  3     Resistance Training   Training Prescription  -  Yes  Yes  -  Yes   Weight  -  3 lbs  3 lb  -  3 lb   Reps  -  10-15  10-15  -  10-15     Interval Training   Interval Training  -  No  No  -  No     Recumbant Elliptical   Level  -  1  -  -  2.5   RPM  -  50  -  -  50   Minutes  -  15  -  -  15   METs  -  1.6  -  -  3     T5 Nustep   Level  -  2   2  -  3   SPM  -  80  80  -  -   Minutes  -  15  15  -  15   METs  -  2.6  3.6  -  -     Track   Laps  -  -  30  -  -   Minutes  -  -  15  -  -   METs  -  -  2.38  -  -     Home Exercise Plan  Plans to continue exercise at  -  -  -  Home (comment)  -   Frequency  -  -  -  Add 1 additional day to program exercise sessions.  -   Initial Home Exercises Provided  -  -  -  11/11/16  -   [place] Name 12/11/16 1100 12/25/16 1200 01/06/17 1300         Response to Exercise   Blood Pressure (Admit)  138/74  116/58  114/68     Blood Pressure (Exercise)  158/84  130/58  132/82     Blood Pressure (Exit)  124/62  114/60  126/70     Heart Rate (Admit)  80 bpm  56 bpm  60 bpm     Heart Rate (Exercise)  117 bpm  104 bpm  110 bpm     Heart Rate (Exit)  67 bpm  72 bpm  83 bpm     Rating of Perceived Exertion (Exercise)  _0 Symptoms  none  none  none     Duration  Continue with 45 min of aerobic exercise without signs/symptoms of physical distress.  Continue with 45 min of aerobic exercise without signs/symptoms of physical distress.  Continue with 45 min of aerobic exercise without signs/symptoms of physical distress.     Intensity  THRR unchanged  THRR unchanged  THRR unchanged       Progression   Progression  Continue to progress workloads to maintain intensity without signs/symptoms of physical distress.  Continue to progress workloads to maintain intensity without signs/symptoms of physical distress.  Continue to progress workloads to maintain intensity without signs/symptoms of physical distress.     Average METs  3.12  2.6  2       Resistance Training   Training Prescription  Yes  Yes  Yes     Weight  3 lb  5 lb  5 lb     Reps  10-15  10-15  10-15       Interval Training   Interval Training  No  No  No       Recumbant Elliptical   Level  -  1  -     RPM  -  50  -     Minutes  -  15  -     METs  -  2.1  -       REL-XR   Level  1  -  1     Minutes  15  -  15     METs  3   -  1.5       T5 Nustep   Level  3  3  -     Minutes  15  15  -     METs  4  3.3  -       Track   Laps  -  30  30     Minutes  -  15  15     METs  -  2.38  2.38       Home Exercise Plan   Plans to continue exercise at  Home (comment)  Home (comment)  Home (comment)     Frequency  Add 1 additional day to program exercise sessions.  Add 1 additional day to program exercise sessions.  Add 1 additional day to program exercise sessions.     Initial Home Exercises Provided  11/11/16  11/11/16  11/11/16        Exercise Comments: Exercise Comments    Row Name 10/21/16 1631           Exercise Comments  First full day of exercise!  Patient was oriented to gym and equipment including functions, settings, policies, and procedures.  Patient's individual exercise prescription and treatment plan were reviewed.  All starting workloads were established based on the results of the 6 minute walk test done at initial orientation visit.  The plan for exercise progression was also introduced and progression will be customized based on patient's performance and goals          Exercise Goals and Review: Exercise Goals    Row Name 10/19/16 1405             Exercise Goals   Increase Physical Activity  Yes       Intervention  Provide advice, education, support and counseling about physical activity/exercise needs.;Develop an individualized exercise prescription for aerobic and resistive training based on initial evaluation findings, risk stratification, comorbidities and participant's personal goals.       Expected Outcomes  Achievement of increased cardiorespiratory fitness and enhanced flexibility, muscular endurance and strength shown through measurements of functional capacity and personal statement of participant.       Increase Strength and Stamina  Yes       Intervention  Provide advice, education, support and counseling about physical activity/exercise needs.;Develop an individualized exercise  prescription for aerobic and resistive training based on initial evaluation findings, risk stratification, comorbidities and participant's personal goals.       Expected Outcomes  Achievement of increased cardiorespiratory fitness and enhanced flexibility, muscular endurance and strength shown through measurements of functional capacity and personal statement of participant.       Able to understand and use rate of perceived exertion (RPE) scale  Yes       Intervention  Provide education and explanation on how to use RPE scale       Expected Outcomes  Short Term: Able to use RPE daily in rehab to express subjective intensity level;Long Term:  Able to use RPE to guide intensity level when exercising independently       Knowledge and understanding of Target Heart Rate Range (THRR)  Yes       Intervention  Provide education and explanation of THRR including how the numbers were predicted and where they are located for reference       Expected Outcomes  Short Term: Able to state/look up THRR;Long Term: Able to use THRR to govern intensity when exercising independently;Short Term: Able to use daily as guideline for intensity in rehab       Able to check pulse independently  Yes       Intervention  Provide education and demonstration on how to check pulse in carotid and radial arteries.;Review the importance of being able to check your own pulse for safety during independent exercise       Expected Outcomes  Short Term: Able to explain why pulse checking is important during independent exercise;Long Term: Able to check pulse independently and accurately       Understanding of Exercise Prescription  Yes       Intervention  Provide education, explanation, and written materials on patient's individual exercise prescription       Expected Outcomes  Short Term: Able to explain program exercise prescription;Long Term: Able to explain home exercise prescription to exercise independently  Exercise Goals  Re-Evaluation : Exercise Goals Re-Evaluation    Row Name 10/21/16 1632 10/29/16 1457 11/11/16 1225 11/12/16 1440 11/25/16 1241     Exercise Goal Re-Evaluation   Exercise Goals Review  Able to understand and use rate of perceived exertion (RPE) scale;Able to check pulse independently;Knowledge and understanding of Target Heart Rate Range (THRR);Understanding of Exercise Prescription;Increase Physical Activity;Increase Strength and Stamina  Increase Physical Activity;Increase Strength and Stamina  Increase Physical Activity;Increase Strength and Stamina;Able to understand and use rate of perceived exertion (RPE) scale;Understanding of Exercise Prescription  Increase Physical Activity;Increase Strength and Stamina;Able to understand and use rate of perceived exertion (RPE) scale;Knowledge and understanding of Target Heart Rate Range (THRR)  Increase Physical Activity;Increase Strength and Stamina;Able to understand and use rate of perceived exertion (RPE) scale   Comments  Reviewed RPE scale, THR and program prescription with pt today.  Pt voiced understanding and was given a copy of goals to take home.   Colin Lopez is tolerating exercise well in his first sessions.  Staff will monitor progression.  Colin Lopez is progressing well and has improved overall MET level.  Staff will continue to monitor.  Reviewed home exercise with pt today.  Pt plans to walk for exercise.  Reviewed THR, pulse, RPE, sign and symptoms, NTG use, and when to call 911 or Lopez.  Also discussed weather considerations and indoor options.  Pt voiced understanding.  Colin Lopez is tolerating exercise well. Staff will review equipment/interval training as his RPE is 11 consistently.     Expected Outcomes  Short: Use RPE daily to regulate intensity.  Long: Follow program prescription in THR.  Short - Colin Lopez will attend regularly.  Long - Colin Lopez will continue exercise on his own.  Short - Colin Lopez wil continue to attend regularly.  Long - Colin Lopez will incorporate  independent exercise.  Short  - Colin Lopez will check his HR while walking at [facility].  Long - Colin Lopez will exercise 3-5 days per week independently.  Short - Colin Lopez will add intervals or other equipment to his program.  Long - Colin Lopez will improve overall MET levels   Row Name 12/11/16 1149 12/25/16 1228 01/06/17 1311         Exercise Goal Re-Evaluation   Exercise Goals Review  Increase Physical Activity;Increase Strength and Stamina;Able to understand and use rate of perceived exertion (RPE) scale  Increase Physical Activity;Increase Strength and Stamina  Increase Physical Activity;Able to understand and use rate of perceived exertion (RPE) scale;Increase Strength and Stamina     Comments  Colin Lopez is trying the XR instead of REL for a new challenge.  Staff will montior progress.  Colin Lopez has increased his weight strength training to 5 lb.  He continues to progress well with exercise.   Colin Lopez is progessing well with exercise.  Staff will recommend Colin Lopez try the TM with incline to increase intensity walking.      Expected Outcomes  Short - Colin Lopez will progress levels on XR Long - Colin Lopez will improve overall MET level  Short - Colin Lopez will complete the HT program.  Long - Colin Lopez will maintain exercise on his own.  Short - Colin Lopez will try the TM Long - Colin Lopez will improve overall MET level        Discharge Exercise Prescription (Final Exercise Prescription Changes): Exercise Prescription Changes - 01/06/17 1300      Response to Exercise   Blood Pressure (Admit)  114/68    Blood Pressure (Exercise)  132/82    Blood Pressure (Exit)  126/70  Heart Rate (Admit)  60 bpm    Heart Rate (Exercise)  110 bpm    Heart Rate (Exit)  83 bpm    Rating of Perceived Exertion (Exercise)  12    Symptoms  none    Duration  Continue with 45 min of aerobic exercise without signs/symptoms of physical distress.    Intensity  THRR unchanged      Progression   Progression  Continue to progress workloads to maintain intensity without  signs/symptoms of physical distress.    Average METs  2      Resistance Training   Training Prescription  Yes    Weight  5 lb    Reps  10-15      Interval Training   Interval Training  No      REL-XR   Level  1    Minutes  15    METs  1.5      Track   Laps  30    Minutes  15    METs  2.38      Home Exercise Plan   Plans to continue exercise at  Home (comment)    Frequency  Add 1 additional day to program exercise sessions.    Initial Home Exercises Provided  11/11/16       Nutrition:  Target Goals: Understanding of nutrition guidelines, daily intake of sodium '1500mg'$ , cholesterol '200mg'$ , calories 30% from fat and 7% or less from saturated fats, daily to have 5 or more servings of fruits and vegetables.  Biometrics: Pre Biometrics - 10/19/16 1406      Pre Biometrics   Height  5' 9.6" (1.768 m)    Weight  206 lb (93.4 kg)    Waist Circumference  41.75 inches    Hip Circumference  39 inches    Waist to Hip Ratio  1.07 %    BMI (Calculated)  29.89    Single Leg Stand  1.66 seconds      Post Biometrics - 01/06/17 1716       Post  Biometrics   Height  '6\' 1"'$  (1.854 m)    Weight  208 lb (94.3 kg)    Waist Circumference  41.75 inches    Hip Circumference  40 inches    Waist to Hip Ratio  1.04 %    BMI (Calculated)  27.45    Single Leg Stand  16.38 seconds       Nutrition Therapy Plan and Nutrition Goals: Nutrition Therapy & Goals - 01/07/17 1644      Nutrition Therapy   RD appointment defered  Yes      Personal Nutrition Goals   Comments  He states he does not want to meet with the dietician. He has been working out more at home to lose weight.       Nutrition Discharge: Rate Your Plate Scores: Nutrition Assessments - 10/19/16 1224      MEDFICTS Scores   Pre Score  9       Nutrition Goals Re-Evaluation:   Nutrition Goals Discharge (Final Nutrition Goals Re-Evaluation):   Psychosocial: Target Goals: Acknowledge presence or absence of  significant depression and/or stress, maximize coping skills, provide positive support system. Participant is able to verbalize types and ability to use techniques and skills needed for reducing stress and depression.   Initial Review & Psychosocial Screening: Initial Psych Review & Screening - 10/19/16 1226      Initial Review   Current issues with  Current Sleep Concerns;Current  Stress Concerns He has issues staying asleep longer than 3 hours a night.    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       Family Dynamics   Good Support System?  Yes wife      Screening Interventions   Interventions  Yes;Encouraged to exercise;Program counselor consult    Expected Outcomes  Short Term goal: Utilizing psychosocial counselor, staff and physician to assist with identification of specific Stressors or current issues interfering with healing process. Setting desired goal for each stressor or current issue identified.;Long Term Goal: Stressors or current issues are controlled or eliminated.;Short Term goal: Identification and review with participant of any Quality of Life or Depression concerns found by scoring the questionnaire.;Long Term goal: The participant improves quality of Life and PHQ9 Scores as seen by post scores and/or verbalization of changes       Quality of Life Scores:  Quality of Life - 10/19/16 1232      Quality of Life Scores   Health/Function Pre  20.93 %    Socioeconomic Pre  21 %    Psych/Spiritual Pre  21 %    Family Pre  21 %    GLOBAL Pre  20.97 %       PHQ-9: Recent Review Flowsheet Data    Depression screen [facility] 2/9 10/19/2016 12/17/2014  07/31/2013   Decreased Interest 1 0 0   Down, Depressed, Hopeless 1 0 0   PHQ - 2 Score 2 0 0   Altered sleeping 2 - -   Tired, decreased energy 3 - -   Change in appetite 2 - -   Feeling bad or failure about yourself  0 - -   Trouble concentrating 1 - -   Moving slowly or fidgety/restless 1 - -   Suicidal thoughts 0 - -   PHQ-9 Score 11 - -   Difficult doing work/chores Somewhat difficult - -     Interpretation of Total Score  Total Score Depression Severity:  1-4 = Minimal depression, 5-9 = Mild depression, 10-14 = Moderate depression, 15-19 = Moderately severe depression, 20-27 = Severe depression   Psychosocial Evaluation and Intervention: Psychosocial Evaluation - 01/06/17 1704      Discharge Psychosocial Assessment & Intervention   Comments  Counselor met with Colin Lopez & his spouse Collie Siad today for a discharge assessment.  He reports being stronger; less dizzy; and sleeping maybe 5 hours now vs. 4.  He is coping better in general as he goes outside with the dogs when he feels upset.  He also reports he has learned a lot since coming into this program.  Colin Lopez has an appointment to see a pulmonologist in a few weeks due to a spot noted on his lungs.  He is optimistic about this and so is his spouse.  Colin Lopez also will see the neurologist in about a month about the dizziness he was experiencing - although that appointment has been cancelled and rescheduled by the Dr. several times - and now his dizziness has decreased significantly.  Counselor commended Colin Lopez and spouse Collie Siad for all his hard work and consistent exercise. He has purchased home gym equipment to continue  the habit of exercising.  Colin Lopez will be graduating from this program next week.         Psychosocial Re-Evaluation: Psychosocial Re-Evaluation    Row Name 11/12/16 1445 12/09/16 1644           Psychosocial Re-Evaluation   Current issues with  None Identified  -      Comments  Colin Lopez states he doesnt really have stress at  this time.  Counselor follow up with [facility] reporting he feels stronger in his legs now since coming into this program.  He continues to have some dizziness and reports no one seems to know why. He is scheduled to see his Dr. about this next Friday and is hoping for some answers.  Colin Lopez reports he is sleeping better - but when asked how much better - he reported  approx. 4 hours/night - which is what he was getting prior to this program.  Colin Lopez says his mood is about the same and he is not experiencing any stress currently.  Counselor commended [employer] on his progress in developing more strength in his legs.  He is walking in the mall 2x per week in addition to this program.  Counselor commended [place] on consistency and commitment to exercise and improving his health.        Expected Outcomes  -  Colin Lopez will see his Dr. about the dizziness to hopefully "get some answers."           Psychosocial Discharge (Final Psychosocial Re-Evaluation): Psychosocial Re-Evaluation - 12/09/16 1644      Psychosocial Re-Evaluation   Comments  Counselor follow up with Colin Lopez reporting he feels stronger in his legs now since coming into this program.  He continues to have some dizziness and reports no one seems to know why. He is scheduled to see his Dr. about this next Friday and is hoping for some answers.  Colin Lopez reports he is sleeping better - but when asked how much better - he reported  approx. 4 hours/night - which is what he was getting prior to this program.  Colin Lopez says his mood is about the same and he is not experiencing any stress currently.  Counselor commended [place] on his progress in developing more strength in his legs.  He is walking in the mall 2x per week in addition to this program.  Counselor commended [place] on consistency and commitment to exercise and improving his health.      Expected Outcomes  Colin Lopez will see his Dr. about the dizziness to hopefully "get some answers."         Vocational  Rehabilitation: Provide vocational rehab assistance to qualifying candidates.   Vocational Rehab Evaluation & Intervention: Vocational Rehab - 10/19/16 1234      Initial Vocational Rehab Evaluation & Intervention   Assessment shows need for Vocational Rehabilitation  No       Education: Education Goals: Education classes will be provided on a variety of topics geared toward better understanding of heart health and risk factor modification. Participant will state understanding/return demonstration of topics presented as noted by education test scores.  Learning Barriers/Preferences: Learning Barriers/Preferences - 10/19/16 1232      Learning Barriers/Preferences   Learning Barriers  Hearing;Reading    Learning Preferences  Individual Instruction;Verbal Instruction       Education Topics: General Nutrition Guidelines/Fats and Fiber: -Group instruction provided by verbal, written material, models and posters to present the general guidelines for heart healthy nutrition. Gives an explanation and review of  dietary fats and fiber.   Cardiac Rehab from 01/06/2017 in [facility] Cardiac and Pulmonary Rehab  Date  12/28/16  Educator  PI  Instruction Review Code  1- Verbalizes Understanding      Controlling Sodium/Reading Food Labels: -Group verbal and written material supporting the discussion of sodium use in heart healthy nutrition. Review and explanation with models, verbal and written materials for utilization of the food label.   Cardiac Rehab from 01/06/2017 in [facility] Cardiac and Pulmonary Rehab  Date  12/28/16  Educator  PI  Instruction Review Code  1- Verbalizes Understanding      Exercise Physiology & Risk Factors: - Group verbal and written instruction with models to review the exercise physiology of the cardiovascular system and associated critical values. Details cardiovascular disease risk factors and the goals associated with each risk factor.   Cardiac Rehab from 01/06/2017  in [facility] Cardiac and Pulmonary Rehab  Date  01/06/17  Educator  AS  Instruction Review Code  1- Verbalizes Understanding      Aerobic Exercise & Resistance Training: - Gives group verbal and written discussion on the health impact of inactivity. On the components of aerobic and resistive training programs and the benefits of this training and how to safely progress through these programs.   Cardiac Rehab from 01/06/2017 in [facility] Cardiac and Pulmonary Rehab  Date  11/11/16  Educator  AS  Instruction Review Code  1- Verbalizes Understanding      Flexibility, Balance, General Exercise Guidelines: - Provides group verbal and written instruction on the benefits of flexibility and balance training programs. Provides general exercise guidelines with specific guidelines to those with heart or lung disease. Demonstration and skill practice provided.   Cardiac Rehab from 01/06/2017 in [facility] Cardiac and Pulmonary Rehab  Date  11/16/16  Educator  [facility]  Instruction Review Code  1- Verbalizes Understanding      Stress Management: - Provides group verbal and written instruction about the health risks of elevated stress, cause of high stress, and healthy ways to reduce stress.   Cardiac Rehab from 01/06/2017 in [facility] Cardiac and Pulmonary Rehab  Date  11/25/16  Educator  [facility]  Instruction Review Code  1- Verbalizes Understanding      Depression: - Provides group verbal and written instruction on the correlation between heart/lung disease and depressed mood, treatment options, and the stigmas associated with seeking treatment.   Cardiac Rehab from 01/06/2017 in [facility] Cardiac and Pulmonary Rehab  Date  10/28/16  Educator  [facility]  Instruction Review Code  1- Verbalizes Understanding      Anatomy & Physiology of the Heart: - Group verbal and written instruction and models provide basic cardiac anatomy and physiology, with the coronary electrical and arterial systems. Review of: AMI, Angina, Valve disease,  Heart Failure, Cardiac Arrhythmia, Pacemakers, and the ICD.   Cardiac Rehab from 01/06/2017 in [facility] Cardiac and Pulmonary Rehab  Date  11/23/16  Educator  [facility]  Instruction Review Code  1- Verbalizes Understanding      Cardiac Procedures: - Group verbal and written instruction to review commonly prescribed medications for heart disease. Reviews the medication, class of the drug, and side effects. Includes the steps to properly store meds and maintain the prescription regimen. (beta blockers and nitrates)   Cardiac Medications I: - Group verbal and written instruction to review commonly prescribed medications for heart disease. Reviews the medication, class of the drug, and side effects. Includes the steps to properly store meds and maintain the prescription regimen.  Cardiac Medications II: -Group verbal and written instruction to review commonly prescribed medications for heart disease. Reviews the medication, class of the drug, and side effects. (all other drug classes)    Go Sex-Intimacy & Heart Disease, Get SMART - Goal Setting: - Group verbal and written instruction through game format to discuss heart disease and the return to sexual intimacy. Provides group verbal and written material to discuss and apply goal setting through the application of the S.M.A.R.T. Method.   Other Matters of the Heart: - Provides group verbal, written materials and models to describe Heart Failure, Angina, Valve Disease, Peripheral Artery Disease, and Diabetes in the realm of heart disease. Includes description of the disease process and treatment options available to the cardiac patient.   Cardiac Rehab from 01/06/2017 in [facility] Cardiac and Pulmonary Rehab  Date  11/23/16  Educator  [facility]  Instruction Review Code  1- Verbalizes Understanding      Exercise & Equipment Safety: - Individual verbal instruction and demonstration of equipment use and safety with use of the equipment.   Cardiac Rehab from  01/06/2017 in [facility] Cardiac and Pulmonary Rehab  Date  10/19/16  Educator  [facility]  Instruction Review Code  1- Verbalizes Understanding      Infection Prevention: - Provides verbal and written material to individual with discussion of infection control including proper hand washing and proper equipment cleaning during exercise session.   Cardiac Rehab from 01/06/2017 in [facility] Cardiac and Pulmonary Rehab  Date  10/19/16  Educator  [facility]  Instruction Review Code  1- Verbalizes Understanding      Falls Prevention: - Provides verbal and written material to individual with discussion of falls prevention and safety.   Cardiac Rehab from 01/06/2017 in [facility] Cardiac and Pulmonary Rehab  Date  10/19/16  Educator  [facility]  Instruction Review Code  1- Verbalizes Understanding      Diabetes: - Individual verbal and written instruction to review signs/symptoms of diabetes, desired ranges of glucose level fasting, after meals and with exercise. Acknowledge that pre and post exercise glucose checks will be done for 3 sessions at entry of program.   Other: -Provides group and verbal instruction on various topics (see comments)    Knowledge Questionnaire Score: Knowledge Questionnaire Score - 10/19/16 1233      Knowledge Questionnaire Score   Pre Score  0 Patient marked unsure for most and did not answer the rest. We reviewed the answers together as it is hard for him to read and comprehend       Core Components/Risk Factors/Patient Goals at Admission: Personal Goals and Risk Factors at Admission - 10/19/16 1220      Core Components/Risk Factors/Patient Goals on Admission    Weight Management  Yes;Weight Loss    Intervention  Weight Management: Develop a combined nutrition and exercise program designed to reach desired caloric intake, while maintaining appropriate intake of nutrient and fiber, sodium and fats, and appropriate energy expenditure required for the weight goal.;Weight Management: Provide  education and appropriate resources to help participant work on and attain dietary goals.    Admit Weight  206 lb (93.4 kg)    Goal Weight: Short Term  200 lb (90.7 kg)    Goal Weight: Long Term  196 lb (88.9 kg)    Expected Outcomes  Long Term: Adherence to nutrition and physical activity/exercise program aimed toward attainment of established weight goal;Short Term: Continue to assess and modify interventions until short term weight is achieved;Weight Loss: Understanding of general  recommendations for a balanced deficit meal plan, which promotes 1-2 lb weight loss per week and includes a negative energy balance of [phone] kcal/d;Understanding recommendations for meals to include 15-35% energy as protein, 25-35% energy from fat, 35-60% energy from carbohydrates, less than '200mg'$  of dietary cholesterol, 20-35 gm of total fiber daily;Understanding of distribution of calorie intake throughout the day with the consumption of 4-5 meals/snacks    Hypertension  Yes    Intervention  Provide education on lifestyle modifcations including regular physical activity/exercise, weight management, moderate sodium restriction and increased consumption of fresh fruit, vegetables, and low fat dairy, alcohol moderation, and smoking cessation.;Monitor prescription use compliance.    Expected Outcomes  Short Term: Continued assessment and intervention until BP is < 140/50m HG in hypertensive participants. < 130/829mHG in hypertensive participants with diabetes, heart failure or chronic kidney disease.;Long Term: Maintenance of blood pressure at goal levels.    Lipids  Yes    Intervention  Provide education and support for participant on nutrition & aerobic/resistive exercise along with prescribed medications to achieve LDL '70mg'$ , HDL >'40mg'$ .    Expected Outcomes  Short Term: Participant states understanding of desired cholesterol values and is compliant with medications prescribed. Participant is following exercise  prescription and nutrition guidelines.;Long Term: Cholesterol controlled with medications as prescribed, with individualized exercise RX and with personalized nutrition plan. Value goals: LDL < '70mg'$ , HDL > 40 mg.    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 Teach and support self-help strategies.;Refer participants experiencing significant psychosocial distress to appropriate mental health specialists for further evaluation and treatment. When possible, include family members and significant others in education/counseling sessions.    Expected Outcomes  Short Term: Participant demonstrates changes in health-related behavior, relaxation and other stress management skills, ability to obtain effective social support, and compliance with psychotropic medications if prescribed.;Long Term: Emotional wellbeing is indicated by absence of clinically significant psychosocial distress or social isolation.       Core Components/Risk Factors/Patient Goals Review:  Goals and Risk Factor Review    Row Name 11/12/16 1442 12/09/16 1743 01/07/17 1642         Core Components/Risk Factors/Patient Goals Review   Personal Goals Review  Hypertension;Stress  Hypertension;Other  Hypertension     Review  [employer] been eating more vegetables and grilled chicken.  His BP has been good during HT sessions.  He states he just gave some land to his kids to "take the burden off me."  He doesnt really have any stress.  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 He is walking 2 times per [place] minutes [place] the mall.  His BP has been in nomral range during class.  Colin Lopez's blood pressure has been getting better. He is taking his Blood pressure medications as prescribed. He states he feels much better than he  did. He states he has lost about 14 pounds since the start of the program.      Expected Outcomes  Short - [employer]ill continue to incorporate more healthy dietary habits.  Long - [employer] maintain heart health.  Short - [employer] continue to attend regularly.  Long - [employer] continue to improve MET level and ROM for shoulder.    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        Core Components/Risk Factors/Patient Goals at Discharge (Final Review):  Goals and Risk Factor Review - 01/07/17 1642      Core Components/Risk Factors/Patient Goals Review   Personal Goals Review  Hypertension    Review  Colin Lopez's blood pressure has been getting better. He is taking his Blood pressure medications as prescribed. He states he feels much better than he did. He states he has lost about 14 pounds since the start of the program.     Expected Outcomes  Short: Graduate [facility]. Long: Exercise independently after [facility] to keep BP levels normal.       ITP Comments: ITP Comments    Row Name 10/19/16 1214 10/21/16 0837 11/18/16 0602 12/16/16 0626 01/13/17 1026   ITP Comments  Med Review completed. Initial ITP created. Diagnosis can be found in [facility] 09/09/16  30 day review. Continue with ITP unless directed changes per Medical Director Review.   30 day review. Continue with ITP unless directed changes per Medical Director review.   30 day review. Continue with ITP unless directed changes per Medical Director review.   30 day review. Continue with ITP unless directed changes per Medical Director review.       Comments:

## 2017-01-13 NOTE — Progress Notes (Signed)
Discharge Progress Report  Patient Details  Name: Colin Lopez MRN: [id number] Date of Birth: 1941-01-02 Referring Provider:     Cardiac Rehab from 10/19/2016 in [facility]  Referring Provider  [name], [name] MD       Number of Visits: 36  Reason for Discharge:  Patient reached a stable level of exercise. Patient independent in their exercise. Patient has met program and personal goals.  Smoking History:  Social History   Tobacco Use  Smoking Status Never Smoker  Smokeless Tobacco Never Used    Diagnosis:  Status post coronary artery stent placement  ADL UCSD:   Initial Exercise Prescription: Initial Exercise Prescription - 10/19/16 1400      Date of Initial Exercise RX and Referring Provider   Date  10/19/16    Referring Provider  [name], [name] MD      Recumbant Elliptical   Level  1    RPM  50    Minutes  15    METs  2.5      T5 Nustep   Level  2    SPM  80    Minutes  15    METs  2.5      Track   Laps  32    Minutes  15    METs  2.45      Prescription Details   Frequency (times per week)  3    Duration  Progress to 45 minutes of aerobic exercise without signs/symptoms of physical distress      Intensity   THRR 40-80% of Max Heartrate  89-126    Ratings of Perceived Exertion  11-13    Perceived Dyspnea  0-4      Progression   Progression  Continue to progress workloads to maintain intensity without signs/symptoms of physical distress.      Resistance Training   Training Prescription  Yes    Weight  3 lbs    Reps  10-15       Discharge Exercise Prescription (Final Exercise Prescription Changes): Exercise Prescription Changes - 01/06/17 1300      Response to Exercise   Blood Pressure (Admit)  114/68    Blood Pressure (Exercise)  132/82    Blood Pressure (Exit)  126/70    Heart Rate (Admit)  60 bpm    Heart Rate (Exercise)  110 bpm    Heart Rate (Exit)  83 bpm    Rating of Perceived Exertion (Exercise)  12     Symptoms  none    Duration  Continue with 45 min of aerobic exercise without signs/symptoms of physical distress.    Intensity  THRR unchanged      Progression   Progression  Continue to progress workloads to maintain intensity without signs/symptoms of physical distress.    Average METs  2      Resistance Training   Training Prescription  Yes    Weight  5 lb    Reps  10-15      Interval Training   Interval Training  No      REL-XR   Level  1    Minutes  15    METs  1.5      Track   Laps  30    Minutes  15    METs  2.38      Home Exercise Plan   Plans to continue exercise at  Home (comment)    Frequency  Add 1 additional day to  program exercise sessions.    Initial Home Exercises Provided  11/11/16       Functional Capacity: 6 Minute Walk    Row Name 10/19/16 1358 01/06/17 1710       6 Minute Walk   Phase  Initial  Discharge    Distance  1288 feet  1758 feet    Walk Time  6 minutes  6 minutes    # of Rest Breaks  0  0    MPH  2.44  3.33    METS  2.78  3.56    RPE  11  13    Perceived Dyspnea   1  3    VO2 Peak  9.71  -    Symptoms  Yes (comment)  Yes (comment)    Comments  dizziness 2/10, SOB  Dizziness    Resting HR  6 bpm  61 bpm    Resting BP  146/62  132/64    Resting Oxygen Saturation   97 %  98 %    Exercise Oxygen Saturation  during 6 min walk  98 %  98 %    Max Ex. HR  114 bpm  108 bpm    Max Ex. BP  154/74  164/66    2 Minute Post BP  128/64  -       Psychological, QOL, Others - Outcomes: PHQ 2/9: Depression screen [facility] 2/9 01/13/2017 10/19/2016 12/17/2014 07/31/2013  Decreased Interest 0 1 0 0  Down, Depressed, Hopeless 0 1 0 0  PHQ - 2 Score 0 2 0 0  Altered sleeping 0 2 - -  Tired, decreased energy 0 3 - -  Change in appetite 0 2 - -  Feeling bad or failure about yourself  0 0 - -  Trouble concentrating 0 1 - -  Moving slowly or fidgety/restless 0 1 - -  Suicidal thoughts 0 0 - -  PHQ-9 Score 0 11 - -  Difficult doing work/chores -  Somewhat difficult - -    Quality of Life: Quality of Life - 01/13/17 1707      Quality of Life Scores   Health/Function Post  20.64 %    Socioeconomic Post  20.38 %    Psych/Spiritual Post  20.43 %    Family Post  21 %    GLOBAL Post  20.59 %       Personal Goals: Goals established at orientation with interventions provided to work toward goal. Personal Goals and Risk Factors at Admission - 10/19/16 1220      Core Components/Risk Factors/Patient Goals on Admission    Weight Management  Yes;Weight Loss    Intervention  Weight Management: Develop a combined nutrition and exercise program designed to reach desired caloric intake, while maintaining appropriate intake of nutrient and fiber, sodium and fats, and appropriate energy expenditure required for the weight goal.;Weight Management: Provide education and appropriate resources to help participant work on and attain dietary goals.    Admit Weight  206 lb (93.4 kg)    Goal Weight: Short Term  200 lb (90.7 kg)    Goal Weight: Long Term  196 lb (88.9 kg)    Expected Outcomes  Long Term: Adherence to nutrition and physical activity/exercise program aimed toward attainment of established weight goal;Short Term: Continue to assess and modify interventions until short term weight is achieved;Weight Loss: Understanding of general recommendations for a balanced deficit meal plan, which promotes 1-2 lb weight loss per week and includes a  negative energy balance of [id number] kcal/d;Understanding recommendations for meals to include 15-35% energy as protein, 25-35% energy from fat, 35-60% energy from carbohydrates, less than 244m of dietary cholesterol, 20-35 gm of total fiber daily;Understanding of distribution of calorie intake throughout the day with the consumption of 4-5 meals/snacks    Hypertension  Yes    Intervention  Provide education on lifestyle modifcations including regular physical activity/exercise, weight management, moderate sodium  restriction and increased consumption of fresh fruit, vegetables, and low fat dairy, alcohol moderation, and smoking cessation.;Monitor prescription use compliance.    Expected Outcomes  Short Term: Continued assessment and intervention until BP is < 140/944mHG in hypertensive participants. < 130/8062mG in hypertensive participants with diabetes, heart failure or chronic kidney disease.;Long Term: Maintenance of blood pressure at goal levels.    Lipids  Yes    Intervention  Provide education and support for participant on nutrition & aerobic/resistive exercise along with prescribed medications to achieve LDL <52m63mDL >40mg52m Expected Outcomes  Short Term: Participant states understanding of desired cholesterol values and is compliant with medications prescribed. Participant is following exercise prescription and nutrition guidelines.;Long Term: Cholesterol controlled with medications as prescribed, with individualized exercise RX and with personalized nutrition plan. Value goals: LDL < 52mg,37m > 40 mg.    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 Teach and support self-help strategies.;Refer participants experiencing significant psychosocial distress to appropriate mental health specialists for further evaluation and treatment. When possible, include family members and significant others in education/counseling sessions.    Expected Outcomes  Short Term: Participant demonstrates changes in health-related behavior, relaxation and other stress management skills, ability to obtain effective social support, and compliance with psychotropic medications if prescribed.;Long Term: Emotional wellbeing is indicated by absence of clinically significant psychosocial distress or social isolation.         Personal Goals Discharge: Goals and Risk Factor Review    Row Name 11/12/16 1442 12/09/16 1743 01/07/17 1642         Core Components/Risk Factors/Patient Goals Review   Personal Goals Review  Hypertension;Stress  Hypertension;Other  Hypertension     Review  Colin Lopez eating more vegetables and grilled chicken.  His BP has been good during HT sessions.  He states he just gave some land to his kids to "take the burden off me."  He doesnt really have any stress.  Colin Abe Peoples his shoulder has been feeling better. He is [place] 2 [place]  His BP has been in nomral range during class.  Colin's blood pressure has been getting better. He is taking his Blood pressure medications as prescribed. He states he feels much better than he did. He states he has lost about 14 pounds since the start of the program.      Expected Outcomes  Short - Colin Abe Peoplecontinue to incorporate more healthy dietary habits.  Long - Colin Abe Peoplemaintain heart health.  Short - Colin Abe Peoplecontinue to attend regularly.  Long - Colin Abe Peoplecontinue to improve MET level and ROM for shoulder.    Short: [employer]: Exercise independently after HeartTrack to keep BP levels normal.        Exercise Goals and Review: Exercise Goals  Segundo Name 10/19/16 1405             Exercise Goals   Increase Physical Activity  Yes       Intervention  Provide advice, education, support and counseling about physical activity/exercise needs.;Develop an individualized exercise prescription for aerobic and resistive training based on initial evaluation findings, risk stratification, comorbidities and participant's personal goals.       Expected Outcomes  Achievement of increased cardiorespiratory fitness and enhanced flexibility, muscular endurance and strength shown through measurements of functional capacity and personal statement of participant.       Increase Strength and Stamina  Yes        Intervention  Provide advice, education, support and counseling about physical activity/exercise needs.;Develop an individualized exercise prescription for aerobic and resistive training based on initial evaluation findings, risk stratification, comorbidities and participant's personal goals.       Expected Outcomes  Achievement of increased cardiorespiratory fitness and enhanced flexibility, muscular endurance and strength shown through measurements of functional capacity and personal statement of participant.       Able to understand and use rate of perceived exertion (RPE) scale  Yes       Intervention  Provide education and explanation on how to use RPE scale       Expected Outcomes  Short Term: Able to use RPE daily in rehab to express subjective intensity level;Long Term:  Able to use RPE to guide intensity level when exercising independently       Knowledge and understanding of Target Heart Rate Range (THRR)  Yes       Intervention  Provide education and explanation of THRR including how the numbers were predicted and where they are located for reference       Expected Outcomes  Short Term: Able to state/look up THRR;Long Term: Able to use THRR to govern intensity when exercising independently;Short Term: Able to use daily as guideline for intensity in rehab       Able to check pulse independently  Yes       Intervention  Provide education and demonstration on how to check pulse in carotid and radial arteries.;Review the importance of being able to check your own pulse for safety during independent exercise       Expected Outcomes  Short Term: Able to explain why pulse checking is important during independent exercise;Long Term: Able to check pulse independently and accurately       Understanding of Exercise Prescription  Yes       Intervention  Provide education, explanation, and written materials on patient's individual exercise prescription       Expected Outcomes  Short Term: Able to  explain program exercise prescription;Long Term: Able to explain home exercise prescription to exercise independently          Nutrition & Weight - Outcomes: Pre Biometrics - 10/19/16 1406      Pre Biometrics   Height  5' 9.6" (1.768 m)    Weight  206 lb (93.4 kg)    Waist Circumference  41.75 inches    Hip Circumference  39 inches    Waist to Hip Ratio  1.07 %    BMI (Calculated)  29.89    Single Leg Stand  1.66 seconds      Post Biometrics - 01/06/17 1716       Post  Biometrics   Height  6' 1" (1.854 m)    Weight  208 lb (94.3 kg)    Waist Circumference  41.75 inches    Hip Circumference  40 inches    Waist to Hip Ratio  1.04 %    BMI (Calculated)  27.45    Single Leg Stand  16.38 seconds       Nutrition: Nutrition Therapy & Goals - 01/07/17 1644      Nutrition Therapy   RD appointment defered  Yes      Personal Nutrition Goals   Comments  He states he does not want to meet with the dietician. He has been working out more at home to lose weight.       Nutrition Discharge: Nutrition Assessments - 01/13/17 1707      MEDFICTS Scores   Post Score  15       Education Questionnaire Score: Knowledge Questionnaire Score - 01/13/17 1708      Knowledge Questionnaire Score   Post Score  26/28 Correct answers reviewed with Abe Lopez (his wife helped him read the questions, but he answered them)       Goals reviewed with patient; copy given to patient.

## 2017-01-13 NOTE — Patient Instructions (Signed)
Discharge Instructions  Patient Details  Name: Colin Lopez MRN: [id number] Date of Birth: 28-Oct-1940 Referring Provider:  [name], [name] M, MD   Number of Visits: 36  Reason for Discharge:  Patient reached a stable level of exercise. Patient independent in their exercise. Patient has met program and personal goals.  Smoking History:  Social History   Tobacco Use  Smoking Status Never Smoker  Smokeless Tobacco Never Used    Diagnosis:  No diagnosis found.  Initial Exercise Prescription: Initial Exercise Prescription - 10/19/16 1400      Date of Initial Exercise RX and Referring Provider   Date  10/19/16    Referring Provider  [name], [name] MD      Recumbant Elliptical   Level  1    RPM  50    Minutes  15    METs  2.5      T5 Nustep   Level  2    SPM  80    Minutes  15    METs  2.5      Track   Laps  32    Minutes  15    METs  2.45      Prescription Details   Frequency (times per week)  3    Duration  Progress to 45 minutes of aerobic exercise without signs/symptoms of physical distress      Intensity   THRR 40-80% of Max Heartrate  89-126    Ratings of Perceived Exertion  11-13    Perceived Dyspnea  0-4      Progression   Progression  Continue to progress workloads to maintain intensity without signs/symptoms of physical distress.      Resistance Training   Training Prescription  Yes    Weight  3 lbs    Reps  10-15       Discharge Exercise Prescription (Final Exercise Prescription Changes): Exercise Prescription Changes - 01/06/17 1300      Response to Exercise   Blood Pressure (Admit)  114/68    Blood Pressure (Exercise)  132/82    Blood Pressure (Exit)  126/70    Heart Rate (Admit)  60 bpm    Heart Rate (Exercise)  110 bpm    Heart Rate (Exit)  83 bpm    Rating of Perceived Exertion (Exercise)  12    Symptoms  none    Duration  Continue with 45 min of aerobic exercise without signs/symptoms of physical distress.    Intensity  THRR  unchanged      Progression   Progression  Continue to progress workloads to maintain intensity without signs/symptoms of physical distress.    Average METs  2      Resistance Training   Training Prescription  Yes    Weight  5 lb    Reps  10-15      Interval Training   Interval Training  No      REL-XR   Level  1    Minutes  15    METs  1.5      Track   Laps  30    Minutes  15    METs  2.38      Home Exercise Plan   Plans to continue exercise at  Home (comment)    Frequency  Add 1 additional day to program exercise sessions.    Initial Home Exercises Provided  11/11/16       Functional Capacity: 6 Minute Walk    Row Name  10/19/16 1358 01/06/17 1710       6 Minute Walk   Phase  Initial  Discharge    Distance  1288 feet  1758 feet    Walk Time  6 minutes  6 minutes    # of Rest Breaks  0  0    MPH  2.44  3.33    METS  2.78  3.56    RPE  11  13    Perceived Dyspnea   1  3    VO2 Peak  9.71  -    Symptoms  Yes (comment)  Yes (comment)    Comments  dizziness 2/10, SOB  Dizziness    Resting HR  6 bpm  61 bpm    Resting BP  146/62  132/64    Resting Oxygen Saturation   97 %  98 %    Exercise Oxygen Saturation  during 6 min walk  98 %  98 %    Max Ex. HR  114 bpm  108 bpm    Max Ex. BP  154/74  164/66    2 Minute Post BP  128/64  -       Quality of Life: Quality of Life - 10/19/16 1232      Quality of Life Scores   Health/Function Pre  20.93 %    Socioeconomic Pre  21 %    Psych/Spiritual Pre  21 %    Family Pre  21 %    GLOBAL Pre  20.97 %       Personal Goals: Goals established at orientation with interventions provided to work toward goal. Personal Goals and Risk Factors at Admission - 10/19/16 1220      Core Components/Risk Factors/Patient Goals on Admission    Weight Management  Yes;Weight Loss    Intervention  Weight Management: Develop a combined nutrition and exercise program designed to reach desired caloric intake, while maintaining  appropriate intake of nutrient and fiber, sodium and fats, and appropriate energy expenditure required for the weight goal.;Weight Management: Provide education and appropriate resources to help participant work on and attain dietary goals.    Admit Weight  206 lb (93.4 kg)    Goal Weight: Short Term  200 lb (90.7 kg)    Goal Weight: Long Term  196 lb (88.9 kg)    Expected Outcomes  Long Term: Adherence to nutrition and physical activity/exercise program aimed toward attainment of established weight goal;Short Term: Continue to assess and modify interventions until short term weight is achieved;Weight Loss: Understanding of general recommendations for a balanced deficit meal plan, which promotes 1-2 lb weight loss per week and includes a negative energy balance of [phone] kcal/d;Understanding recommendations for meals to include 15-35% energy as protein, 25-35% energy from fat, 35-60% energy from carbohydrates, less than 251m of dietary cholesterol, 20-35 gm of total fiber daily;Understanding of distribution of calorie intake throughout the day with the consumption of 4-5 meals/snacks    Hypertension  Yes    Intervention  Provide education on lifestyle modifcations including regular physical activity/exercise, weight management, moderate sodium restriction and increased consumption of fresh fruit, vegetables, and low fat dairy, alcohol moderation, and smoking cessation.;Monitor prescription use compliance.    Expected Outcomes  Short Term: Continued assessment and intervention until BP is < 140/972mHG in hypertensive participants. < 130/8042mG in hypertensive participants with diabetes, heart failure or chronic kidney disease.;Long Term: Maintenance of blood pressure at goal levels.    Lipids  Yes  Intervention  Provide education and support for participant on nutrition & aerobic/resistive exercise along with prescribed medications to achieve LDL <73m, HDL >442m    Expected Outcomes  Short Term:  Participant states understanding of desired cholesterol values and is compliant with medications prescribed. Participant is following exercise prescription and nutrition guidelines.;Long Term: Cholesterol controlled with medications as prescribed, with individualized exercise RX and with personalized nutrition plan. Value goals: LDL < 7065mHDL > 40 mg.    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 Teach and support self-help strategies.;Refer participants experiencing significant psychosocial distress to appropriate mental health specialists for further evaluation and treatment. When possible, include family members and significant others in education/counseling sessions.    Expected Outcomes  Short Term: Participant demonstrates changes in health-related behavior, relaxation and other stress management skills, ability to obtain effective social support, and compliance with psychotropic medications if prescribed.;Long Term: Emotional wellbeing is indicated by absence of clinically significant psychosocial distress or social isolation.        Personal Goals Discharge: Goals and Risk Factor Review - 01/07/17 1642      Core Components/Risk Factors/Patient Goals Review   Personal Goals Review  Hypertension    Review  Colin Lopez's blood pressure has been getting better. He is taking his Blood pressure medications as prescribed. He states he feels much better than he did. He states he has lost about 14 pounds since the start of the program.     Expected Outcomes  Short: Graduate [facility]. Long: Exercise independently after [facility] to keep BP levels normal.       Exercise Goals and Review: Exercise Goals    Row Name 10/19/16 1405             Exercise Goals   Increase Physical  Activity  Yes       Intervention  Provide advice, education, support and counseling about physical activity/exercise needs.;Develop an individualized exercise prescription for aerobic and resistive training based on initial evaluation findings, risk stratification, comorbidities and participant's personal goals.       Expected Outcomes  Achievement of increased cardiorespiratory fitness and enhanced flexibility, muscular endurance and strength shown through measurements of functional capacity and personal statement of participant.       Increase Strength and Stamina  Yes       Intervention  Provide advice, education, support and counseling about physical activity/exercise needs.;Develop an individualized exercise prescription for aerobic and resistive training based on initial evaluation findings, risk stratification, comorbidities and participant's personal goals.       Expected Outcomes  Achievement of increased cardiorespiratory fitness and enhanced flexibility, muscular endurance and strength shown through measurements of functional capacity and personal statement of participant.       Able to understand and use rate of perceived exertion (RPE) scale  Yes       Intervention  Provide education and explanation on how to use RPE scale       Expected Outcomes  Short Term: Able to use RPE daily in rehab to express subjective intensity level;Long Term:  Able to use RPE to guide intensity level when exercising independently       Knowledge and understanding of Target Heart Rate Range (THRR)  Yes       Intervention  Provide education and explanation of  THRR including how the numbers were predicted and where they are located for reference       Expected Outcomes  Short Term: Able to state/look up THRR;Long Term: Able to use THRR to govern intensity when exercising independently;Short Term: Able to use daily as guideline for intensity in rehab       Able to check pulse independently  Yes       Intervention   Provide education and demonstration on how to check pulse in carotid and radial arteries.;Review the importance of being able to check your own pulse for safety during independent exercise       Expected Outcomes  Short Term: Able to explain why pulse checking is important during independent exercise;Long Term: Able to check pulse independently and accurately       Understanding of Exercise Prescription  Yes       Intervention  Provide education, explanation, and written materials on patient's individual exercise prescription       Expected Outcomes  Short Term: Able to explain program exercise prescription;Long Term: Able to explain home exercise prescription to exercise independently          Nutrition & Weight - Outcomes: Pre Biometrics - 10/19/16 1406      Pre Biometrics   Height  5' 9.6" (1.768 m)    Weight  206 lb (93.4 kg)    Waist Circumference  41.75 inches    Hip Circumference  39 inches    Waist to Hip Ratio  1.07 %    BMI (Calculated)  29.89    Single Leg Stand  1.66 seconds      Post Biometrics - 01/06/17 1716       Post  Biometrics   Height  '6\' 1"'  (1.854 m)    Weight  208 lb (94.3 kg)    Waist Circumference  41.75 inches    Hip Circumference  40 inches    Waist to Hip Ratio  1.04 %    BMI (Calculated)  27.45    Single Leg Stand  16.38 seconds       Nutrition: Nutrition Therapy & Goals - 01/07/17 1644      Nutrition Therapy   RD appointment defered  Yes      Personal Nutrition Goals   Comments  He states he does not want to meet with the dietician. He has been working out more at home to lose weight.       Nutrition Discharge: Nutrition Assessments - 10/19/16 1224      MEDFICTS Scores   Pre Score  9       Education Questionnaire Score: Knowledge Questionnaire Score - 10/19/16 1233      Knowledge Questionnaire Score   Pre Score  0 Patient marked unsure for most and did not answer the rest. We reviewed the answers together as it is hard for him to  read and comprehend       Goals reviewed with patient; copy given to patient.

## 2017-01-13 NOTE — Progress Notes (Signed)
Cardiac Individual Treatment Plan  Patient Details  Name: SAVA PROBY MRN: [id number] Date of Birth: 12-22-1940 Referring Provider:     Cardiac Rehab from 10/19/2016 in [facility] Cardiac and Pulmonary Rehab  Referring Provider  [name], [name] MD      Initial Encounter Date:    Cardiac Rehab from 10/19/2016 in [facility] Cardiac and Pulmonary Rehab  Date  10/19/16  Referring Provider  [name], [name] MD      Visit Diagnosis: Status post coronary artery stent placement  Patient's Home Medications on Admission:  Current Outpatient Medications:  .  acetaminophen (TYLENOL) 500 MG tablet, Take 1,000 mg by mouth every 6 (six) hours as needed for headache (pain)., Disp: , Rfl:  .  aspirin EC 81 MG tablet, Take 1 tablet (81 mg total) by mouth daily., Disp: 90 tablet, Rfl: 3 .  atorvastatin (LIPITOR) 40 MG tablet, Take 1 tablet (40 mg total) by mouth at bedtime., Disp: 30 tablet, Rfl: 4 .  clopidogrel (PLAVIX) 75 MG tablet, Take 1 tablet (75 mg total) by mouth daily with breakfast., Disp: 90 tablet, Rfl: 2 .  fenofibrate 160 MG tablet, Take 1 tablet (160 mg total) by mouth daily., Disp: 30 tablet, Rfl: 3 .  Krill Oil 1000 MG CAPS, Take 1,000 mg by mouth at bedtime., Disp: , Rfl:  .  lisinopril (PRINIVIL,ZESTRIL) 10 MG tablet, TAKE 1 TABLET BY MOUTH ONCE DAILY, Disp: 90 tablet, Rfl: 1 .  nebivolol (BYSTOLIC) 10 MG tablet, Take 1 tablet (10 mg total) by mouth daily. (Patient taking differently: Take 10 mg by mouth at bedtime. ), Disp: 90 tablet, Rfl: 3 .  nitroGLYCERIN (NITROSTAT) 0.4 MG SL tablet, Place 1 tablet (0.4 mg total) under the tongue every 5 (five) minutes as needed for chest pain., Disp: 25 tablet, Rfl: 3 .  OVER THE COUNTER MEDICATION, Apply 1 application topically daily as needed (pain). Horse Liniment otc cream, Disp: , Rfl:  .  pantoprazole (PROTONIX) 40 MG tablet, Take 1 tablet (40 mg total) by mouth daily., Disp: 30 tablet, Rfl: 11  Past Medical History: Past Medical History:  Diagnosis  Date  . CAD (coronary artery disease), native coronary artery    09/09/16 PCI/DES x3 to mRCA, OM1 and dLcx/OM2.   . Coronary disease    Status post stenting of the left circumflex coronary in 2009 with a bare-metal stent (with a 3.5x74m Liberte stent)  . Dyslipidemia   . 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 Headache   . Hyperlipidemia   . Hypertension   . [place]STEMI (non-ST elevated [place] infarction) [place]   BMS CFX  . TIA (transient ischemic attack)    history of tia    Tobacco Use: Social History   Tobacco Use  Smoking Status Never Smoker  Smokeless Tobacco Never Used    Labs: Recent Review Flowsheet Data    Labs for ITP Cardiac and Pulmonary Rehab Latest Ref Rng & Units 05/08/2014 12/17/2014 09/03/2016 10/12/2016 11/13/2016   Cholestrol 0 - 200 mg/dL - 283(H) 202(H) 134 138   LDLCALC 0 - 99 mg/dL - - 99 UNABLE TO CALCULATE IF TRIGLYCERIDE OVER 400 mg/dL 73   LDLDIRECT mg/dL - 74.0 - - -   HDL >39.00 mg/dL - 27.90(L) 30(L) 21(L) 26.70(L)   Trlycerides 0.0 - 149.0 mg/dL - 1196.0 Triglyceride is over 400; calculations on Lipids are invalid.(H) 367(H) 505(H) 195.0(H)   Hemoglobin A1c 4.8 - 5.6 % 5.6 5.7 - 5.1 -       Exercise Target Goals:  Exercise Program Goal: Individual exercise prescription set with THRR, safety & activity barriers. Participant demonstrates ability to understand and report RPE using BORG scale, to self-measure pulse accurately, and to acknowledge the importance of the exercise prescription.  Exercise Prescription Goal: Starting with aerobic activity 30 plus minutes a day, 3 days per week for initial exercise prescription. Provide home exercise prescription and guidelines that participant acknowledges understanding prior to discharge.  Activity Barriers & Risk Stratification: Activity Barriers & Cardiac Risk Stratification - 10/19/16 1236      Activity Barriers & Cardiac Risk Stratification   Activity Barriers  Joint Problems;Arthritis;Muscular  Weakness;Shortness of Breath;Back Problems;Deconditioning;Balance Concerns;History of Falls [name]'s knees and lower back cause issues for him    Cardiac Risk Stratification  High       6 Minute Walk: 6 Minute Walk    Row Name 10/19/16 1358 01/06/17 1710       6 Minute Walk   Phase  Initial  Discharge    Distance  1288 feet  1758 feet    Walk Time  6 minutes  6 minutes    # of Rest Breaks  0  0    MPH  2.44  3.33    METS  2.78  3.56    RPE  11  13    Perceived Dyspnea   1  3    VO2 Peak  9.71  -    Symptoms  Yes (comment)  Yes (comment)    Comments  dizziness 2/10, SOB  Dizziness    Resting HR  6 bpm  61 bpm    Resting BP  146/62  132/64    Resting Oxygen Saturation   97 %  98 %    Exercise Oxygen Saturation  during 6 min walk  98 %  98 %    Max Ex. HR  114 bpm  108 bpm    Max Ex. BP  154/74  164/66    2 Minute Post BP  128/64  -       Oxygen Initial Assessment:   Oxygen Re-Evaluation:   Oxygen Discharge (Final Oxygen Re-Evaluation):   Initial Exercise Prescription: Initial Exercise Prescription - 10/19/16 1400      Date of Initial Exercise RX and Referring Provider   Date  10/19/16    Referring Provider  [name], [name] MD      Recumbant Elliptical   Level  1    RPM  50    Minutes  15    METs  2.5      T5 Nustep   Level  2    SPM  80    Minutes  15    METs  2.5      Track   Laps  32    Minutes  15    METs  2.45      Prescription Details   Frequency (times per week)  3    Duration  Progress to 45 minutes of aerobic exercise without signs/symptoms of physical distress      Intensity   THRR 40-80% of Max Heartrate  89-126    Ratings of Perceived Exertion  11-13    Perceived Dyspnea  0-4      Progression   Progression  Continue to progress workloads to maintain intensity without signs/symptoms of physical distress.      Resistance Training   Training Prescription  Yes    Weight  3 lbs    Reps  10-15  Perform Capillary Blood Glucose  checks as needed.  Exercise Prescription Changes: Exercise Prescription Changes    Row Name 10/19/16 1300 10/29/16 1400 11/11/16 1200 11/12/16 1400 11/25/16 1200     Response to Exercise   Blood Pressure (Admit)  146/62  116/68  130/70  -  134/72   Blood Pressure (Exercise)  154/74  122/54  128/60  -  152/78   Blood Pressure (Exit)  128/64  110/60  118/54  -  114/66   Heart Rate (Admit)  54 bpm  80 bpm  75 bpm  -  45 bpm   Heart Rate (Exercise)  108 bpm  92 bpm  101 bpm  -  111 bpm   Heart Rate (Exit)  52 bpm  73 bpm  74 bpm  -  60 bpm   Oxygen Saturation (Admit)  97 %  -  -  -  -   Oxygen Saturation (Exercise)  98 %  -  -  -  -   Rating of Perceived Exertion (Exercise)  _0 -  11   Perceived Dyspnea (Exercise)  1  -  -  -  -   Symptoms  dizziness (2/10) and SOB  none  noone  -  none   Comments  walk test results  -  -  -  -   Duration  -  Progress to 45 minutes of aerobic exercise without signs/symptoms of physical distress  Progress to 45 minutes of aerobic exercise without signs/symptoms of physical distress  -  Continue with 45 min of aerobic exercise without signs/symptoms of physical distress.   Intensity  -  THRR unchanged  THRR unchanged  -  THRR unchanged     Progression   Progression  -  Continue to progress workloads to maintain intensity without signs/symptoms of physical distress.  Continue to progress workloads to maintain intensity without signs/symptoms of physical distress.  -  Continue to progress workloads to maintain intensity without signs/symptoms of physical distress.   Average METs  -  2.1  2.99  -  3     Resistance Training   Training Prescription  -  Yes  Yes  -  Yes   Weight  -  3 lbs  3 lb  -  3 lb   Reps  -  10-15  10-15  -  10-15     Interval Training   Interval Training  -  No  No  -  No     Recumbant Elliptical   Level  -  1  -  -  2.5   RPM  -  50  -  -  50   Minutes  -  15  -  -  15   METs  -  1.6  -  -  3     T5 Nustep   Level  -  2   2  -  3   SPM  -  80  80  -  -   Minutes  -  15  15  -  15   METs  -  2.6  3.6  -  -     Track   Laps  -  -  30  -  -   Minutes  -  -  15  -  -   METs  -  -  2.38  -  -     Home Exercise Plan  Plans to continue exercise at  -  -  -  Home (comment)  -   Frequency  -  -  -  Add 1 additional day to program exercise sessions.  -   Initial Home Exercises Provided  -  -  -  11/11/16  -   [place] Name 12/11/16 1100 12/25/16 1200 01/06/17 1300         Response to Exercise   Blood Pressure (Admit)  138/74  116/58  114/68     Blood Pressure (Exercise)  158/84  130/58  132/82     Blood Pressure (Exit)  124/62  114/60  126/70     Heart Rate (Admit)  80 bpm  56 bpm  60 bpm     Heart Rate (Exercise)  117 bpm  104 bpm  110 bpm     Heart Rate (Exit)  67 bpm  72 bpm  83 bpm     Rating of Perceived Exertion (Exercise)  _0 Symptoms  none  none  none     Duration  Continue with 45 min of aerobic exercise without signs/symptoms of physical distress.  Continue with 45 min of aerobic exercise without signs/symptoms of physical distress.  Continue with 45 min of aerobic exercise without signs/symptoms of physical distress.     Intensity  THRR unchanged  THRR unchanged  THRR unchanged       Progression   Progression  Continue to progress workloads to maintain intensity without signs/symptoms of physical distress.  Continue to progress workloads to maintain intensity without signs/symptoms of physical distress.  Continue to progress workloads to maintain intensity without signs/symptoms of physical distress.     Average METs  3.12  2.6  2       Resistance Training   Training Prescription  Yes  Yes  Yes     Weight  3 lb  5 lb  5 lb     Reps  10-15  10-15  10-15       Interval Training   Interval Training  No  No  No       Recumbant Elliptical   Level  -  1  -     RPM  -  50  -     Minutes  -  15  -     METs  -  2.1  -       REL-XR   Level  1  -  1     Minutes  15  -  15     METs  3   -  1.5       T5 Nustep   Level  3  3  -     Minutes  15  15  -     METs  4  3.3  -       Track   Laps  -  30  30     Minutes  -  15  15     METs  -  2.38  2.38       Home Exercise Plan   Plans to continue exercise at  Home (comment)  Home (comment)  Home (comment)     Frequency  Add 1 additional day to program exercise sessions.  Add 1 additional day to program exercise sessions.  Add 1 additional day to program exercise sessions.     Initial Home Exercises Provided  11/11/16  11/11/16  11/11/16        Exercise Comments: Exercise Comments    Row Name 10/21/16 1631 01/13/17 1706         Exercise Comments  First full day of exercise!  Patient was oriented to gym and equipment including functions, settings, policies, and procedures.  Patient's individual exercise prescription and treatment plan were reviewed.  All starting workloads were established based on the results of the 6 minute walk test done at initial orientation visit.  The plan for exercise progression was also introduced and progression will be customized based on patient's performance and goals   Alek graduated today from cardiac rehab with 36 sessions completed.  Details of the patient's exercise prescription and what He needs to do in order to continue the prescription and progress were discussed with patient.  Patient was given a copy of prescription and goals.  Patient verbalized understanding.  Costa plans to continue to exercise at home with his equipment and walking         Exercise Goals and Review: Exercise Goals    Row Name 10/19/16 1405             Exercise Goals   Increase Physical Activity  Yes       Intervention  Provide advice, education, support and counseling about physical activity/exercise needs.;Develop an individualized exercise prescription for aerobic and resistive training based on initial evaluation findings, risk stratification, comorbidities and participant's personal goals.        Expected Outcomes  Achievement of increased cardiorespiratory fitness and enhanced flexibility, muscular endurance and strength shown through measurements of functional capacity and personal statement of participant.       Increase Strength and Stamina  Yes       Intervention  Provide advice, education, support and counseling about physical activity/exercise needs.;Develop an individualized exercise prescription for aerobic and resistive training based on initial evaluation findings, risk stratification, comorbidities and participant's personal goals.       Expected Outcomes  Achievement of increased cardiorespiratory fitness and enhanced flexibility, muscular endurance and strength shown through measurements of functional capacity and personal statement of participant.       Able to understand and use rate of perceived exertion (RPE) scale  Yes       Intervention  Provide education and explanation on how to use RPE scale       Expected Outcomes  Short Term: Able to use RPE daily in rehab to express subjective intensity level;Long Term:  Able to use RPE to guide intensity level when exercising independently       Knowledge and understanding of Target Heart Rate Range (THRR)  Yes       Intervention  Provide education and explanation of THRR including how the numbers were predicted and where they are located for reference       Expected Outcomes  Short Term: Able to state/look up THRR;Long Term: Able to use THRR to govern intensity when exercising independently;Short Term: Able to use daily as guideline for intensity in rehab       Able to check pulse independently  Yes       Intervention  Provide education and demonstration on how to check pulse in carotid and radial arteries.;Review the importance of being able to check your own pulse for safety during independent exercise       Expected Outcomes  Short Term: Able to explain why pulse checking is important during independent exercise;Long Term: Able to  check pulse independently and accurately  Understanding of Exercise Prescription  Yes       Intervention  Provide education, explanation, and written materials on patient's individual exercise prescription       Expected Outcomes  Short Term: Able to explain program exercise prescription;Long Term: Able to explain home exercise prescription to exercise independently          Exercise Goals Re-Evaluation : Exercise Goals Re-Evaluation    Row Name 10/21/16 1632 10/29/16 1457 11/11/16 1225 11/12/16 1440 11/25/16 1241     Exercise Goal Re-Evaluation   Exercise Goals Review  Able to understand and use rate of perceived exertion (RPE) scale;Able to check pulse independently;Knowledge and understanding of Target Heart Rate Range (THRR);Understanding of Exercise Prescription;Increase Physical Activity;Increase Strength and Stamina  Increase Physical Activity;Increase Strength and Stamina  Increase Physical Activity;Increase Strength and Stamina;Able to understand and use rate of perceived exertion (RPE) scale;Understanding of Exercise Prescription  Increase Physical Activity;Increase Strength and Stamina;Able to understand and use rate of perceived exertion (RPE) scale;Knowledge and understanding of Target Heart Rate Range (THRR)  Increase Physical Activity;Increase Strength and Stamina;Able to understand and use rate of perceived exertion (RPE) scale   Comments  Reviewed RPE scale, THR and program prescription with pt today.  Pt voiced understanding and was given a copy of goals to take home.   [name] is tolerating exercise well in his first sessions.  Staff will monitor progression.  [name] is progressing well and has improved overall MET level.  Staff will continue to monitor.  Reviewed home exercise with pt today.  Pt plans to walk for exercise.  Reviewed THR, pulse, RPE, sign and symptoms, NTG use, and when to call 911 or MD.  Also discussed weather considerations and indoor options.  Pt voiced  understanding.  [name] is tolerating exercise well. Staff will review equipment/interval training as his RPE is 11 consistently.     Expected Outcomes  Short: Use RPE daily to regulate intensity.  Long: Follow program prescription in THR.  Short - [name] will attend regularly.  Long - [name] will continue exercise on his own.  Short - [name] wil continue to attend regularly.  Long - [name] will incorporate independent exercise.  Short  - [name] will check his HR while walking at [facility].  Long - [name] will exercise 3-5 days per week independently.  Short - [name] will add intervals or other equipment to his program.  Long - [name] will improve overall MET levels   Row Name 12/11/16 1149 12/25/16 1228 01/06/17 1311         Exercise Goal Re-Evaluation   Exercise Goals Review  Increase Physical Activity;Increase Strength and Stamina;Able to understand and use rate of perceived exertion (RPE) scale  Increase Physical Activity;Increase Strength and Stamina  Increase Physical Activity;Able to understand and use rate of perceived exertion (RPE) scale;Increase Strength and Stamina     Comments  [name] is trying the XR instead of REL for a new challenge.  Staff will montior progress.  [name] has increased his weight strength training to 5 lb.  He continues to progress well with exercise.   [name] is progessing well with exercise.  Staff will recommend Billy try the TM with incline to increase intensity walking.      Expected Outcomes  Short - [name] will progress levels on XR Long - Billy will improve overall MET level  Short - [name] will complete the HT program.  Long - [name] will maintain exercise on his own.  Short - [name] will try the  TM Long - Billy will improve overall MET level        Discharge Exercise Prescription (Final Exercise Prescription Changes): Exercise Prescription Changes - 01/06/17 1300      Response to Exercise   Blood Pressure (Admit)  114/68    Blood Pressure (Exercise)  132/82    Blood Pressure  (Exit)  126/70    Heart Rate (Admit)  60 bpm    Heart Rate (Exercise)  110 bpm    Heart Rate (Exit)  83 bpm    Rating of Perceived Exertion (Exercise)  12    Symptoms  none    Duration  Continue with 45 min of aerobic exercise without signs/symptoms of physical distress.    Intensity  THRR unchanged      Progression   Progression  Continue to progress workloads to maintain intensity without signs/symptoms of physical distress.    Average METs  2      Resistance Training   Training Prescription  Yes    Weight  5 lb    Reps  10-15      Interval Training   Interval Training  No      REL-XR   Level  1    Minutes  15    METs  1.5      Track   Laps  30    Minutes  15    METs  2.38      Home Exercise Plan   Plans to continue exercise at  Home (comment)    Frequency  Add 1 additional day to program exercise sessions.    Initial Home Exercises Provided  11/11/16       Nutrition:  Target Goals: Understanding of nutrition guidelines, daily intake of sodium <1559m, cholesterol <2022m calories 30% from fat and 7% or less from saturated fats, daily to have 5 or more servings of fruits and vegetables.  Biometrics: Pre Biometrics - 10/19/16 1406      Pre Biometrics   Height  5' 9.6" (1.768 m)    Weight  206 lb (93.4 kg)    Waist Circumference  41.75 inches    Hip Circumference  39 inches    Waist to Hip Ratio  1.07 %    BMI (Calculated)  29.89    Single Leg Stand  1.66 seconds      Post Biometrics - 01/06/17 1716       Post  Biometrics   Height  _0  (1.854 m)    Weight  208 lb (94.3 kg)    Waist Circumference  41.75 inches    Hip Circumference  40 inches    Waist to Hip Ratio  1.04 %    BMI (Calculated)  27.45    Single Leg Stand  16.38 seconds       Nutrition Therapy Plan and Nutrition Goals: Nutrition Therapy & Goals - 01/07/17 [place]    Nutrition Therapy   RD appointment defered  Yes      Personal Nutrition Goals   Comments  He states he does not want  to meet with the dietician. He has been working out more at home to lose weight.       Nutrition Discharge: Rate Your Plate Scores: Nutrition Assessments - 01/13/17 1707      MEDFICTS Scores   Post Score  15       Nutrition Goals Re-Evaluation:   Nutrition Goals Discharge (Final Nutrition Goals Re-Evaluation):   Psychosocial: Target Goals: Acknowledge presence or  absence of significant depression and/or stress, maximize coping skills, provide positive support system. Participant is able to verbalize types and ability to use techniques and skills needed for reducing stress and depression.   Initial Review & Psychosocial Screening: Initial Psych Review & Screening - 10/19/16 1226      Initial Review   Current issues with  Current Sleep Concerns;Current Stress Concerns He has issues staying asleep longer than 3 hours a night.    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       Family Dynamics   Good Support System?  Yes wife      Screening Interventions   Interventions  Yes;Encouraged to exercise;Program counselor consult    Expected Outcomes  Short Term goal: Utilizing psychosocial counselor, staff and physician to assist with identification of specific Stressors or current issues interfering with healing process. Setting desired goal for each stressor or current issue identified.;Long Term Goal: Stressors or current issues are controlled or eliminated.;Short Term goal: Identification and review with participant of any Quality of Life or Depression concerns found by scoring the questionnaire.;Long Term goal: The participant improves quality of Life and  PHQ9 Scores as seen by post scores and/or verbalization of changes       Quality of Life Scores:  Quality of Life - 01/13/17 1707      Quality of Life Scores   Health/Function Post  20.64 %    Socioeconomic Post  20.38 %    Psych/Spiritual Post  20.43 %    Family Post  21 %    GLOBAL Post  20.59 %       PHQ-9: Recent Review Flowsheet Data    Depression screen [facility] 2/9 01/13/2017 10/19/2016 12/17/2014 07/31/2013   Decreased Interest 0 1 0 0   Down, Depressed, Hopeless 0 1 0 0   PHQ - 2 Score 0 2 0 0   Altered sleeping 0 2 - -   Tired, decreased energy 0 3 - -   Change in appetite 0 2 - -   Feeling bad or failure about yourself  0 0 - -   Trouble concentrating 0 1 - -   Moving slowly or fidgety/restless 0 1 - -   Suicidal thoughts 0 0 - -   PHQ-9 Score 0 11 - -   Difficult doing work/chores - Somewhat difficult - -     Interpretation of Total Score  Total Score Depression Severity:  1-4 = Minimal depression, 5-9 = Mild depression, 10-14 = Moderate depression, 15-19 = Moderately severe depression, 20-27 = Severe depression   Psychosocial Evaluation and Intervention: Psychosocial Evaluation - 01/06/17 1704      Discharge Psychosocial Assessment & Intervention   Comments  Counselor met with [name] & his spouse Collie Siad today for a discharge assessment.  He reports being stronger; less dizzy; and sleeping maybe 5 hours now vs. 4.  He is coping better in general as he goes outside with the dogs when he feels upset.  He also reports he has learned a lot since coming into this program.  [name] has an appointment to see a pulmonologist in a few weeks due to a spot noted  on his lungs.  He is optimistic about this and so is his spouse.  [name] also will see the neurologist in about a month about the dizziness he was experiencing - although that appointment has been cancelled and rescheduled by the Dr. several times - and now his dizziness has decreased significantly.  Counselor commended [name] and  spouse Collie Siad for all his hard work and consistent exercise. He has purchased home gym equipment to continue the habit of exercising.  [name] will be graduating from this program next week.         Psychosocial Re-Evaluation: Psychosocial Re-Evaluation    Row Name 11/12/16 1445 12/09/16 1644           Psychosocial Re-Evaluation   Current issues with  None Identified  -      Comments  [name] states he doesnt really have stress at this time.  Counselor follow up with [facility] reporting he feels stronger in his legs now since coming into this program.  He continues to have some dizziness and reports no one seems to know why. He is scheduled to see his Dr. about this next Friday and is hoping for some answers.  [name] reports he is sleeping better - but when asked how much better - he reported  approx. 4 hours/night - which is what he was getting prior to this program.  [name] says his mood is about the same and he is not experiencing any stress currently.  Counselor commended [place] on his progress in developing more strength in his legs.  He is walking in the mall 2x per week in addition to this program.  Counselor commended [place] on consistency and commitment to exercise and improving his health.        Expected Outcomes  -  [name] will see his Dr. about the dizziness to hopefully "get some answers."           Psychosocial Discharge (Final Psychosocial Re-Evaluation): Psychosocial Re-Evaluation - 12/09/16 1644      Psychosocial Re-Evaluation   Comments  Counselor follow up with [name] reporting he feels stronger in his legs now since coming into this program.  He continues to have some dizziness and reports no one seems to know why. He is scheduled to see his Dr. about this next Friday and is hoping for some answers.  [name] reports he is sleeping better - but when asked how much better - he reported  approx. 4 hours/night - which is what he was getting prior to this program.  [name] says his mood is about the  same and he is not experiencing any stress currently.  Counselor commended [place] on his progress in developing more strength in his legs.  He is walking in the mall 2x per week in addition to this program.  Counselor commended [place] on consistency and commitment to exercise and improving his health.      Expected Outcomes  [name] will see his Dr. about the dizziness to hopefully "get some answers."         Vocational Rehabilitation: Provide vocational rehab assistance to qualifying candidates.   Vocational Rehab Evaluation & Intervention: Vocational Rehab - 10/19/16 1234      Initial Vocational Rehab Evaluation & Intervention   Assessment shows need for Vocational Rehabilitation  No       Education: Education Goals: Education classes will be provided on a variety of topics geared toward better understanding of heart health and risk factor modification. Participant will state understanding/return demonstration of  topics presented as noted by education test scores.  Learning Barriers/Preferences: Learning Barriers/Preferences - 10/19/16 1232      Learning Barriers/Preferences   Learning Barriers  Hearing;Reading    Learning Preferences  Individual Instruction;Verbal Instruction       Education Topics: General Nutrition Guidelines/Fats and Fiber: -Group instruction provided by verbal, written material, models and posters to present the general guidelines for heart healthy nutrition. Gives an explanation and review of dietary fats and fiber.   Cardiac Rehab from 01/13/2017 in [facility] Cardiac and Pulmonary Rehab  Date  12/28/16  Educator  PI  Instruction Review Code  1- Verbalizes Understanding      Controlling Sodium/Reading Food Labels: -Group verbal and written material supporting the discussion of sodium use in heart healthy nutrition. Review and explanation with models, verbal and written materials for utilization of the food label.   Cardiac Rehab from 01/13/2017 in [facility] Cardiac and  Pulmonary Rehab  Date  12/28/16  Educator  PI  Instruction Review Code  1- Verbalizes Understanding      Exercise Physiology & Risk Factors: - Group verbal and written instruction with models to review the exercise physiology of the cardiovascular system and associated critical values. Details cardiovascular disease risk factors and the goals associated with each risk factor.   Cardiac Rehab from 01/13/2017 in [facility] Cardiac and Pulmonary Rehab  Date  01/06/17  Educator  AS  Instruction Review Code  1- Verbalizes Understanding      Aerobic Exercise & Resistance Training: - Gives group verbal and written discussion on the health impact of inactivity. On the components of aerobic and resistive training programs and the benefits of this training and how to safely progress through these programs.   Cardiac Rehab from 01/13/2017 in [facility] Cardiac and Pulmonary Rehab  Date  01/13/17  Educator  AS  Instruction Review Code  1- Verbalizes Understanding      Flexibility, Balance, General Exercise Guidelines: - Provides group verbal and written instruction on the benefits of flexibility and balance training programs. Provides general exercise guidelines with specific guidelines to those with heart or lung disease. Demonstration and skill practice provided.   Cardiac Rehab from 01/13/2017 in [facility] Cardiac and Pulmonary Rehab  Date  11/16/16  Educator  [facility]  Instruction Review Code  1- Verbalizes Understanding      Stress Management: - Provides group verbal and written instruction about the health risks of elevated stress, cause of high stress, and healthy ways to reduce stress.   Cardiac Rehab from 01/13/2017 in [facility] Cardiac and Pulmonary Rehab  Date  11/25/16  Educator  [facility]  Instruction Review Code  1- Verbalizes Understanding      Depression: - Provides group verbal and written instruction on the correlation between heart/lung disease and depressed mood, treatment options, and the stigmas  associated with seeking treatment.   Cardiac Rehab from 01/13/2017 in [facility] Cardiac and Pulmonary Rehab  Date  10/28/16  Educator  [facility]  Instruction Review Code  1- Verbalizes Understanding      Anatomy & Physiology of the Heart: - Group verbal and written instruction and models provide basic cardiac anatomy and physiology, with the coronary electrical and arterial systems. Review of: AMI, Angina, Valve disease, Heart Failure, Cardiac Arrhythmia, Pacemakers, and the ICD.   Cardiac Rehab from 01/13/2017 in [facility] Cardiac and Pulmonary Rehab  Date  11/23/16  Educator  [facility]  Instruction Review Code  1- Verbalizes Understanding      Cardiac Procedures: - Group verbal and written  instruction to review commonly prescribed medications for heart disease. Reviews the medication, class of the drug, and side effects. Includes the steps to properly store meds and maintain the prescription regimen. (beta blockers and nitrates)   Cardiac Medications I: - Group verbal and written instruction to review commonly prescribed medications for heart disease. Reviews the medication, class of the drug, and side effects. Includes the steps to properly store meds and maintain the prescription regimen.   Cardiac Medications II: -Group verbal and written instruction to review commonly prescribed medications for heart disease. Reviews the medication, class of the drug, and side effects. (all other drug classes)    Go Sex-Intimacy & Heart Disease, Get SMART - Goal Setting: - Group verbal and written instruction through game format to discuss heart disease and the return to sexual intimacy. Provides group verbal and written material to discuss and apply goal setting through the application of the S.M.A.R.T. Method.   Other Matters of the Heart: - Provides group verbal, written materials and models to describe Heart Failure, Angina, Valve Disease, Peripheral Artery Disease, and Diabetes in the realm of heart disease.  Includes description of the disease process and treatment options available to the cardiac patient.   Cardiac Rehab from 01/13/2017 in [facility] Cardiac and Pulmonary Rehab  Date  11/23/16  Educator  [facility]  Instruction Review Code  1- Verbalizes Understanding      Exercise & Equipment Safety: - Individual verbal instruction and demonstration of equipment use and safety with use of the equipment.   Cardiac Rehab from 01/13/2017 in [facility] Cardiac and Pulmonary Rehab  Date  10/19/16  Educator  [facility]  Instruction Review Code  1- Verbalizes Understanding      Infection Prevention: - Provides verbal and written material to individual with discussion of infection control including proper hand washing and proper equipment cleaning during exercise session.   Cardiac Rehab from 01/13/2017 in [facility] Cardiac and Pulmonary Rehab  Date  10/19/16  Educator  [facility]  Instruction Review Code  1- Verbalizes Understanding      Falls Prevention: - Provides verbal and written material to individual with discussion of falls prevention and safety.   Cardiac Rehab from 01/13/2017 in [facility] Cardiac and Pulmonary Rehab  Date  10/19/16  Educator  [facility]  Instruction Review Code  1- Verbalizes Understanding      Diabetes: - Individual verbal and written instruction to review signs/symptoms of diabetes, desired ranges of glucose level fasting, after meals and with exercise. Acknowledge that pre and post exercise glucose checks will be done for 3 sessions at entry of program.   Other: -Provides group and verbal instruction on various topics (see comments)    Knowledge Questionnaire Score: Knowledge Questionnaire Score - 01/13/17 1708      Knowledge Questionnaire Score   Post Score  26/28 Correct answers reviewed with [name] (his wife helped him read the questions, but he answered them)       Core Components/Risk Factors/Patient Goals at Admission: Personal Goals and Risk Factors at Admission - 10/19/16 1220      Core  Components/Risk Factors/Patient Goals on Admission    Weight Management  Yes;Weight Loss    Intervention  Weight Management: Develop a combined nutrition and exercise program designed to reach desired caloric intake, while maintaining appropriate intake of nutrient and fiber, sodium and fats, and appropriate energy expenditure required for the weight goal.;Weight Management: Provide education and appropriate resources to help participant work on and attain dietary goals.    Admit Weight  206 lb (93.4 kg)    Goal Weight: Short Term  200 lb (90.7 kg)    Goal Weight: Long Term  196 lb (88.9 kg)    Expected Outcomes  Long Term: Adherence to nutrition and physical activity/exercise program aimed toward attainment of established weight goal;Short Term: Continue to assess and modify interventions until short term weight is achieved;Weight Loss: Understanding of general recommendations for a balanced deficit meal plan, which promotes 1-2 lb weight loss per week and includes a negative energy balance of [phone] kcal/d;Understanding recommendations for meals to include 15-35% energy as protein, 25-35% energy from fat, 35-60% energy from carbohydrates, less than 217m of dietary cholesterol, 20-35 gm of total fiber daily;Understanding of distribution of calorie intake throughout the day with the consumption of 4-5 meals/snacks    Hypertension  Yes    Intervention  Provide education on lifestyle modifcations including regular physical activity/exercise, weight management, moderate sodium restriction and increased consumption of fresh fruit, vegetables, and low fat dairy, alcohol moderation, and smoking cessation.;Monitor prescription use compliance.    Expected Outcomes  Short Term: Continued assessment and intervention until BP is < 140/960mHG in hypertensive participants. < 130/8056mG in hypertensive participants with diabetes, heart failure or chronic kidney disease.;Long Term: Maintenance of blood pressure at  goal levels.    Lipids  Yes    Intervention  Provide education and support for participant on nutrition & aerobic/resistive exercise along with prescribed medications to achieve LDL <79m75mDL >40mg60m Expected Outcomes  Short Term: Participant states understanding of desired cholesterol values and is compliant with medications prescribed. Participant is following exercise prescription and nutrition guidelines.;Long Term: Cholesterol controlled with medications as prescribed, with individualized exercise RX and with personalized nutrition plan. Value goals: LDL < 79mg,74m > 40 mg.    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 Teach and support self-help strategies.;Refer participants experiencing significant psychosocial distress to appropriate mental health specialists for further evaluation and treatment. When possible, include family members and significant others in education/counseling sessions.    Expected Outcomes  Short Term: Participant demonstrates changes in health-related behavior, relaxation and other stress management skills, ability to obtain effective social support, and compliance with psychotropic medications if prescribed.;Long Term: Emotional wellbeing is indicated by absence of clinically significant psychosocial distress or social isolation.       Core Components/Risk Factors/Patient Goals Review:  Goals and Risk Factor Review    Row Name 11/12/16 1442 12/09/16 1743 01/07/17 1642         Core Components/Risk Factors/Patient Goals Review   Personal Goals Review  Hypertension;Stress  Hypertension;Other  Hypertension     Review  Billy Abe Peopleeen eating more vegetables and grilled chicken.  His BP has been good during HT sessions.  He states he just gave some land  to his kids to "take the burden off me."  He doesnt really have any stress.  Billy Abe Peoples his shoulder has been feeling better. He is walking 2 times per week for 45 minutes at the mall.  His BP has been in nomral range during class.  Billy's blood pressure has been getting better. He is taking his Blood pressure medications as prescribed. He states he feels much better than he did. He states he has lost about 14 pounds  since the start of the program.      Expected Outcomes  Short - [name] will continue to incorporate more healthy dietary habits.  Long - [name] will maintain heart health.  Short - [name] will continue to attend regularly.  Long - [name] will continue to improve MET level and ROM for shoulder.    Short: Engineer, manufacturing. Long: Exercise independently after [facility] to keep BP levels normal.        Core Components/Risk Factors/Patient Goals at Discharge (Final Review):  Goals and Risk Factor Review - 01/07/17 1642      Core Components/Risk Factors/Patient Goals Review   Personal Goals Review  Hypertension    Review  Billy's blood pressure has been getting better. He is taking his Blood pressure medications as prescribed. He states he feels much better than he did. He states he has lost about 14 pounds since the start of the program.     Expected Outcomes  Short: Graduate [facility]. Long: Exercise independently after [facility] to keep BP levels normal.       ITP Comments: ITP Comments    Row Name 10/19/16 1214 10/21/16 0837 11/18/16 0602 12/16/16 0626 01/13/17 1026   ITP Comments  Med Review completed. Initial ITP created. Diagnosis can be found in [facility] 09/09/16  30 day review. Continue with ITP unless directed changes per Medical Director Review.   30 day review. Continue with ITP unless directed changes per Medical Director review.   30 day review. Continue with ITP unless directed changes per Medical Director review.   30 day review. Continue with ITP unless directed changes per  Medical Director review.       Comments: Discharge ITP

## 2017-02-03 ENCOUNTER — Ambulatory Visit: Payer: Self-pay | Admitting: Internal Medicine

## 2017-02-03 ENCOUNTER — Telehealth: Payer: Self-pay | Admitting: Cardiology

## 2017-02-03 NOTE — Progress Notes (Deleted)
Subjective:    Patient ID: Colin Lopez, male    DOB: 03-16-1940, 77 y.o.   MRN: [id number]  HPI  Pt presents to the clinic today for follow up of chronic conditions.  HLD with CAD, Angina:  History of Stroke:  HTN:   GERD:  Review of Systems  Past Medical History:  Diagnosis Date  . CAD (coronary artery disease), native coronary artery    09/09/16 PCI/DES x3 to mRCA, OM1 and dLcx/OM2.   . Coronary disease    Status post stenting of the left circumflex coronary in 2009 with a bare-metal stent (with a 3.5x45mm Liberte stent)  . Dyslipidemia   . 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 Headache   . Hyperlipidemia   . Hypertension   . NSTEMI (non-ST elevated myocardial infarction) ([place]) 2009   BMS CFX  . TIA (transient ischemic attack)    history of tia    Current Outpatient Medications  Medication Sig Dispense Refill  . acetaminophen (TYLENOL) 500 MG tablet Take 1,000 mg by mouth every 6 (six) hours as needed for headache (pain).    [name] aspirin EC 81 MG tablet Take 1 tablet (81 mg total) by mouth daily. 90 tablet 3  . atorvastatin (LIPITOR) 40 MG tablet Take 1 tablet (40 mg total) by mouth at bedtime. 30 tablet 4  . clopidogrel (PLAVIX) 75 MG tablet Take 1 tablet (75 mg total) by mouth daily with breakfast. 90 tablet 2  . fenofibrate 160 MG tablet Take 1 tablet (160 mg total) by mouth daily. 30 tablet 3  . Krill Oil 1000 MG CAPS Take 1,000 mg by mouth at bedtime.    [name] lisinopril (PRINIVIL,ZESTRIL) 10 MG tablet TAKE 1 TABLET BY MOUTH ONCE DAILY 90 tablet 1  . nebivolol (BYSTOLIC) 10 MG tablet Take 1 tablet (10 mg total) by mouth daily. (Patient taking differently: Take 10 mg by mouth at bedtime. ) 90 tablet 3  . nitroGLYCERIN (NITROSTAT) 0.4 MG SL tablet Place 1 tablet (0.4 mg total) under the tongue every 5 (five) minutes as needed for chest pain. 25 tablet 3  . OVER THE COUNTER MEDICATION Apply 1 application topically daily as needed (pain). Horse Liniment otc cream    . pantoprazole  (PROTONIX) 40 MG tablet Take 1 tablet (40 mg total) by mouth daily. 30 tablet 11   No current facility-administered medications for this visit.     Allergies  Allergen Reactions  . Penicillins Hives    Has patient had a PCN reaction causing immediate rash, facial/tongue/throat swelling, SOB or lightheadedness with hypotension: No Has patient had a PCN reaction causing severe rash involving mucus membranes or skin necrosis: Yes Has patient had a PCN reaction that required hospitalization: No Has patient had a PCN reaction occurring within the last 10 years: No If all of the above answers are "NO", then may proceed with Cephalosporin use.   [name] Doxycycline Rash    Water blisters  . Levaquin [Levofloxacin In D5w] Rash    Family History  Problem Relation Age of Onset  . Diabetes Mother   . CAD Sister 1       MI, obese  . Cancer Brother        stomach    Social History   Socioeconomic History  . Marital status: Married    Spouse name: Not on file  . Number of children: 6  . Years of education: Not on file  . Highest education level: Not on file  Social Needs  .  Financial resource strain: Not on file  . Food insecurity - worry: Not on file  . Food insecurity - inability: Not on file  . Transportation needs - medical: Not on file  . Transportation needs - non-medical: Not on file  Occupational History  . Occupation: Archivist  Tobacco Use  . Smoking status: Never Smoker  . Smokeless tobacco: Never Used  Substance and Sexual Activity  . Alcohol use: Yes    Comment: Rarely  . Drug use: No  . Sexual activity: Not Currently  Other Topics Concern  . Not on file  Social History Narrative  . Not on file     Constitutional: Denies fever, malaise, fatigue, headache or abrupt weight changes.  HEENT: Denies eye pain, eye redness, ear pain, ringing in the ears, wax buildup, runny nose, nasal congestion, bloody nose, or sore throat. Respiratory: Denies difficulty breathing,  shortness of breath, cough or sputum production.   Cardiovascular: Denies chest pain, chest tightness, palpitations or swelling in the hands or feet.  Gastrointestinal: Denies abdominal pain, bloating, constipation, diarrhea or blood in the stool.  GU: Denies urgency, frequency, pain with urination, burning sensation, blood in urine, odor or discharge. Musculoskeletal: Denies decrease in range of motion, difficulty with gait, muscle pain or joint pain and swelling.  Skin: Denies redness, rashes, lesions or ulcercations.  Neurological: Denies dizziness, difficulty with memory, difficulty with speech or problems with balance and coordination.  Psych: Denies anxiety, depression, SI/HI.  No other specific complaints in a complete review of systems (except as listed in HPI above).     Objective:   Physical Exam        Assessment & Plan:

## 2017-02-03 NOTE — Telephone Encounter (Signed)
Pt needs his Fenofibrate 160 mg another doctor prescribed this for  him in the hospital.She would like Dr [name] to write a new prescription for him please.

## 2017-02-04 MED ORDER — ATORVASTATIN CALCIUM 40 MG PO TABS: 40.0000 mg | ORAL_TABLET | Freq: Every day | ORAL | 6 refills | Status: DC

## 2017-02-04 MED ORDER — FENOFIBRATE 160 MG PO TABS: 160.0000 mg | ORAL_TABLET | Freq: Every day | ORAL | 6 refills | Status: DC

## 2017-02-04 MED ORDER — LISINOPRIL 10 MG PO TABS: 10.0000 mg | ORAL_TABLET | Freq: Every day | ORAL | 6 refills | Status: DC

## 2017-02-04 NOTE — Telephone Encounter (Signed)
Returned call to patient's wife.She stated husband needed refills for fenofibrate,lisinopril,atorvastatin.Refills sent to pharmacy.

## 2017-02-08 ENCOUNTER — Encounter (INDEPENDENT_AMBULATORY_CARE_PROVIDER_SITE_OTHER): Payer: Self-pay

## 2017-02-08 ENCOUNTER — Ambulatory Visit: Payer: Medicare Other | Admitting: Neurology

## 2017-02-08 ENCOUNTER — Encounter: Payer: Self-pay | Admitting: Neurology

## 2017-02-08 VITALS — BP 112/68 | HR 67 | Wt 204.6 lb

## 2017-02-08 DIAGNOSIS — I639 Cerebral infarction, unspecified: Secondary | ICD-10-CM

## 2017-02-08 NOTE — Progress Notes (Signed)
[facility] [address] [place]. [place] [address] [phone]       OFFICE FOLLOW-UP NOTE  [name] Date of Birth:  06/02/1940 Medical Record Number:  [id number]   HPI: [name] is a 77 year old male seen today for the first office follow-up visit following hospital admission for TIA in October 2018. History is obtained from the patient and review of electronic medical records. I personally reviewed imaging films.Colin Lopez an 77 y.o.malewhite right-handed male with PMH of coronary artery disease status post stents into 4, hyperlipidemia, TIA who was out a restaurant with his wife when he had sudden onset dizziness and blurry vision.He states that around p.m. he was with his wife and all of a sudden he felt very dizzy sensation of the room spinning, blurry vision, nausea and felt like he went to pass out. He had difficulty with balance and they decided to drive to the emergency room. Here I did [facility] ER around 7:30 PM and his symptoms were improving. He still feels a little ataxic on walking for remaining symptoms have resolved. He no longer complains of blurred vision, dizziness. He denies any tinnitus, slurred speech, double vision aphasia or confusion. His blood pressure on arrival was 117/65.He takes aspirin 81 mg and Plavix 75 mg as he had recent cardiac stentsplaced 6 weeks ago.MRI performed the emergency room which demonstrated a splenium infarct. Patient was admitted for stroke workup neurology was consulted.Date last known well: 9.30.18.Time last known well: 6 PM.tPA Given:No, not brought in as a stroke alert NIHSS: 0 Baseline MRS0.  CT scan of the head on admission showed no acute abnormality and an MRI scan showed a subcentimeter acute to subacute tiny infarct involving the right splenium of the corpus callosum with possible additional tiny punctate infarcts in the frontal lobe as well. Carotid ultrasound showed no significant extracranial stenosis.  Transthoracic echo done on 09/16/16 was normal. CT angiogram of the brain and neck showed advanced arthritic changes with calcific plaques at carotid bifurcation but no significant large vessel stenosis or occlusion was noted in the brain or the neck. LDL cholesterol could not be calculated due to elevated triglyceride level of 505. Hemoglobin A1c was 5.1. Patient had history of cardiac stent less than a year ago and hence was on aspirin and Plavix which was continued. She was started on Lipitor and fenofibrate. Patient states his is doing well since discharge. His abnormal recurrent TIA or stroke symptoms. He does complain of increased bruising but no major bleeding episode. States his blood pressure has been under good control and today it is 112/68 in our office. He is eating healthy as well as exercising regularly and goes to the gym 3 times per week. He has in fact lost 14 pounds. His follow-up lipid profile also shows improvement with LDL cholesterol being on the 72 mg percent. He has no new complaints today.   ROS:   14 system review of systems is positive for  increased bruising, dizziness , weight loss and all other systems negative.  PMH:  Past Medical History:  Diagnosis Date  . CAD (coronary artery disease), native coronary artery    09/09/16 PCI/DES x3 to mRCA, OM1 and dLcx/OM2.   . Coronary disease    Status post stenting of the left circumflex coronary in 2009 with a bare-metal stent (with a 3.5x71mm Liberte stent)  . Dyslipidemia   . 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 Hyperlipidemia   . Hypertension   . NSTEMI (non-ST  elevated myocardial infarction) ([place]) 2009   BMS CFX  . Stroke ([place])   . TIA (transient ischemic attack)    history of tia    Social History:  Social History   Socioeconomic History  . Marital status: Married    Spouse name: Not on file  . Number of children: 6  . Years of education: Not on file  . Highest education level: Not on file  Social Needs  . Financial resource  strain: Not on file  . Food insecurity - worry: Not on file  . Food insecurity - inability: Not on file  . Transportation needs - medical: Not on file  . Transportation needs - non-medical: Not on file  Occupational History  . Occupation: Archivist  Tobacco Use  . Smoking status: Never Smoker  . Smokeless tobacco: Never Used  Substance and Sexual Activity  . Alcohol use: Yes    Comment: Rarely   . Drug use: No  . Sexual activity: Not Currently  Other Topics Concern  . Not on file  Social History Narrative  . Not on file    Medications:   Current Outpatient Medications on File Prior to Visit  Medication Sig Dispense Refill  . acetaminophen (TYLENOL) 500 MG tablet Take 1,000 mg by mouth every 6 (six) hours as needed for headache (pain).    [name] aspirin EC 81 MG tablet Take 1 tablet (81 mg total) by mouth daily. 90 tablet 3  . atorvastatin (LIPITOR) 40 MG tablet Take 1 tablet (40 mg total) by mouth at bedtime. 30 tablet 6  . clopidogrel (PLAVIX) 75 MG tablet Take 1 tablet (75 mg total) by mouth daily with breakfast. 90 tablet 2  . fenofibrate 160 MG tablet Take 1 tablet (160 mg total) by mouth daily. 30 tablet 6  . Krill Oil 1000 MG CAPS Take 1,000 mg by mouth at bedtime.    [name] lisinopril (PRINIVIL,ZESTRIL) 10 MG tablet Take 1 tablet (10 mg total) by mouth daily. 30 tablet 6  . nebivolol (BYSTOLIC) 10 MG tablet Take 1 tablet (10 mg total) by mouth daily. (Patient taking differently: Take 10 mg by mouth at bedtime. ) 90 tablet 3  . nitroGLYCERIN (NITROSTAT) 0.4 MG SL tablet Place 1 tablet (0.4 mg total) under the tongue every 5 (five) minutes as needed for chest pain. 25 tablet 3  . OVER THE COUNTER MEDICATION Apply 1 application topically daily as needed (pain). Horse Liniment otc cream    . pantoprazole (PROTONIX) 40 MG tablet Take 1 tablet (40 mg total) by mouth daily. 30 tablet 11   No current facility-administered medications on file prior to visit.     Allergies:   Allergies    Allergen Reactions  . Penicillins Hives    Has patient had a PCN reaction causing immediate rash, facial/tongue/throat swelling, SOB or lightheadedness with hypotension: No Has patient had a PCN reaction causing severe rash involving mucus membranes or skin necrosis: Yes Has patient had a PCN reaction that required hospitalization: No Has patient had a PCN reaction occurring within the last 10 years: No If all of the above answers are "NO", then may proceed with Cephalosporin use.   [name] Doxycycline Rash    Water blisters  . Levaquin [Levofloxacin In D5w] Rash    Physical Exam General: well developed, well nourished elderly male, seated, in no evident distress Head: head normocephalic and atraumatic.  Neck: supple with no carotid or supraclavicular bruits Cardiovascular: regular rate and rhythm, no murmurs Musculoskeletal: no deformity  Skin:  no rash/petichiae Vascular:  Normal pulses all extremities Vitals:   02/08/17 1632  BP: 112/68  Pulse: 67   Neurologic Exam Mental Status: Awake and fully alert. Oriented to place and time. Recent and remote memory intact. Attention span, concentration and fund of knowledge appropriate. Mood and affect appropriate.  Cranial Nerves: Fundoscopic exam reveals sharp disc margins. Pupils equal, briskly reactive to light. Extraocular movements full without nystagmus. Visual fields full to confrontation. Hearing intact. Facial sensation intact. Face, tongue, palate moves normally and symmetrically.  Motor: Normal bulk and tone. Normal strength in all tested extremity muscles. Sensory.: intact to touch ,pinprick .position and vibratory sensation.  Coordination: Rapid alternating movements normal in all extremities. Finger-to-nose and heel-to-shin performed accurately bilaterally. Gait and Station: Arises from chair without difficulty. Stance is normal. Gait demonstrates normal stride length and balance . Able to heel, toe and tandem walk with mild  difficulty.  Reflexes: 1+ and symmetric. Toes downgoing.   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 Vascular risk factors of hypertension hyperlipidemia, obesity and coronary artery disease    PLAN: I had a long d/w patient about his recent stroke, risk for recurrent stroke/TIAs, personally independently reviewed imaging studies and stroke evaluation results and answered questions.Continue aspirin 81 mg daily and clopidogrel 75 mg daily  for secondary stroke prevention  Given cardiac stent and maintain strict control of hypertension with blood pressure goal below 130/90, diabetes with hemoglobin A1c goal below 6.5% and lipids with LDL cholesterol goal below 70 mg/dL. I also advised the patient to eat a healthy diet with plenty of whole grains, cereals, fruits and vegetables, exercise regularly and maintain ideal body weight Followup in the future with my nurse practitioner in 6 months or call earlier if necessary.Greater than 50% of time during this 25 minute visit was spent on counseling,explanation of diagnosis, planning of further management, discussion with patient and family and coordination of care Antony Contras, MD  [facility] [address] [place], [place] [address]  Phone [phone] Fax [phone] Note: This document was prepared with digital dictation and possible smart phrase technology. Any transcriptional errors that result from this process are unintentional

## 2017-02-08 NOTE — Patient Instructions (Signed)
-  continue aspirin and plavix -continue lipitor and fenofibrate -maintain healthy diet and exercise -maintain blood pressure less than 130/80 -maintain LDL <70 -follow up in 6 months

## 2017-03-11 ENCOUNTER — Telehealth: Payer: Self-pay | Admitting: Cardiology

## 2017-03-11 MED ORDER — LISINOPRIL 10 MG PO TABS: 10.0000 mg | ORAL_TABLET | Freq: Every day | ORAL | 6 refills | Status: DC

## 2017-03-11 MED ORDER — ATORVASTATIN CALCIUM 40 MG PO TABS: 40.0000 mg | ORAL_TABLET | Freq: Every day | ORAL | 6 refills | Status: DC

## 2017-03-11 NOTE — Telephone Encounter (Signed)
Refill sent to the pharmacy electronically.  

## 2017-03-11 NOTE — Telephone Encounter (Signed)
New Message    *STAT* If patient is at the pharmacy, call can be transferred to refill team.   1. Which medications need to be refilled? (please list name of each medication and dose if known) atorvastatin (LIPITOR) 40 MG tablet and lisinopril (PRINIVIL,ZESTRIL) 10 MG tablet  2. Which pharmacy/location (including street and city if local pharmacy) is medication to be sent to? [place], [place]  3. Do they need a 30 day or 90 day supply? [place]

## 2017-05-21 ENCOUNTER — Other Ambulatory Visit: Payer: Self-pay | Admitting: Cardiology

## 2017-05-21 NOTE — Telephone Encounter (Signed)
REFILL 

## 2017-05-27 NOTE — Progress Notes (Signed)
Colin Lopez Date of Birth: [birth year] Medical Record #[id number]  History of Present Illness: Colin Lopez is seen for follow up CAD.  He has a history of coronary disease with a non-ST elevation myocardial infarction in 2009. He underwent stenting of the left circumflex coronary with a 3.5 x 16 mm Liberte stent. He does have a history of hypertension and hyperlipidemia. He is intolerant to Crestor.  He was seen in February 2018 with some atypical left shoulder and neck pain. He was tachycardic. A Myoview study was ordered but was never done. He had a CT chest in April which was negative for PE. There was a rounded density in the LLL of unclear etiology. Later PET scan in June showed low metabolic activity and low risk.  He noted severe symptoms of bad indigestion over 2 months with burning in his throat and then bad chest pressure like it is going to explode after eating. He underwent a Lexiscan myoview study which showed multiple perfusion defects and EF 37%. This led to a cardiac cath in late August 2018  that showed 3 vessel disease with successful management with DES x 3. Unable to assess LV function at cath.   He was admitted in late September 2018. 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 No intracranial branch vessel disease.  - recent 2-D echo on 09/16/16 showed EF of 55-60% with grade 1 diastolic dysfunction, no wall motion abnormalities.  - Neurology consulted, continue aspirin, Plavix.   On follow up today he is doing well from a cardiac standpoint. He denies any chest pain, SOB, palpitations, dizziness, edema. He is active remodeling a house now. His  only complaint is of cramps in his legs and wrists especially when he is more active.   Current Outpatient Medications on File Prior to Visit  Medication Sig Dispense Refill  . acetaminophen (TYLENOL) 500 MG tablet Take 1,000 mg by mouth every 6 (six) hours as needed for headache (pain).    [name] aspirin EC 81 MG tablet Take 1 tablet (81 mg total) by mouth daily. 90 tablet 3  . atorvastatin (LIPITOR) 40 MG tablet Take 1 tablet (40 mg total) by mouth at bedtime. 30 tablet 6  . fenofibrate 160 MG tablet Take 1 tablet (160 mg total) by mouth daily. 30 tablet 6  . Krill Oil 1000 MG CAPS Take 1,000 mg by mouth at bedtime.    [name] lisinopril (PRINIVIL,ZESTRIL) 10 MG tablet Take 1 tablet (10 mg total) by mouth daily. 30 tablet 6  . nebivolol (BYSTOLIC) 10 MG tablet Take 1 tablet (10 mg total) by mouth daily. (Patient taking differently: Take 10 mg by mouth at bedtime. ) 90 tablet 3  . nitroGLYCERIN (NITROSTAT) 0.4 MG SL tablet Place 1 tablet (0.4 mg total) under the tongue every 5 (five) minutes as needed for chest pain. 25 tablet 3  . OVER THE COUNTER MEDICATION Apply 1 application topically daily as needed (pain). Horse Liniment otc cream    . pantoprazole (PROTONIX) 40 MG tablet Take 1 tablet (40 mg total) by mouth daily. 30 tablet 11   No current facility-administered medications on file prior to visit.     Allergies  Allergen Reactions  . Penicillins  Hives    Has patient had a PCN reaction causing immediate rash, facial/tongue/throat swelling, SOB or lightheadedness with hypotension: No Has patient had a PCN reaction causing severe rash involving mucus membranes or skin necrosis: Yes Has patient had a PCN reaction that required hospitalization: No Has patient had a PCN reaction occurring within the last 10 years: No If all of the above answers are "NO", then may proceed with Cephalosporin use.   [name] Doxycycline Rash    Water blisters  . Levaquin [Levofloxacin In D5w] Rash    Past Medical  History:  Diagnosis Date  . CAD (coronary artery disease), native coronary artery    09/09/16 PCI/DES x3 to mRCA, OM1 and dLcx/OM2.   . Coronary disease    Status post stenting of the left circumflex coronary in 2009 with a bare-metal stent (with a 3.5x51m Liberte stent)  . Dyslipidemia   . 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 Hyperlipidemia   . Hypertension   . [place]non[place]ST [place]   BMS CFX  . [place]   . TIA (transient ischemic attack)    history of tia    Past Surgical History:  Procedure Laterality Date  . CARDIOVASCULAR STRESS TEST  10-03-08   EF 59%  . CORONARY ANGIOPLASTY WITH STENT PLACEMENT  09/09/2016  . CORONARY STENT INTERVENTION N/A 09/09/2016   Procedure: CORONARY STENT INTERVENTION;  Surgeon: JMartinique [name] M, MD;  [facility];  Service: Cardiovascular;  Laterality: N/A;  . LEFT HEART CATH AND CORONARY ANGIOGRAPHY N/A 09/09/2016   Procedure: LEFT HEART CATH AND CORONARY ANGIOGRAPHY;  Surgeon: JMartinique [name] M, MD;  [facility];  Service: Cardiovascular;  Laterality: N/A;  . [place]  09-21-08   EF 55-60%    Social History   Tobacco Use  Smoking Status Never Smoker  Smokeless Tobacco Never Used    Social History   Substance and Sexual Activity  Alcohol Use Yes   Comment: Rarely     Family History  Problem Relation Age of Onset  . Diabetes Mother   . CAD Sister 434      MI, obese  . Cancer Brother        stomach    Review of Systems: As noted in history of present illness.  All other systems were reviewed and are negative.  Physical Exam: BP 122/60 (BP Location: Left Arm)   Pulse 65   Ht 6' (1.829 m)   Wt 207 lb 3.2 oz (94 kg)   BMI 28.10 kg/m  GENERAL:  Well appearing WM in NAD HEENT:  PERRL, EOMI, sclera are clear. Oropharynx is clear. NECK:  No jugular venous distention, carotid upstroke brisk and symmetric, no bruits, no thyromegaly or adenopathy LUNGS:  Clear to  auscultation bilaterally CHEST:  Unremarkable HEART:  RRR,  PMI not displaced or sustained,S1 and S2 within normal limits, no S3, no S4: no clicks, no rubs, no murmurs ABD:  Soft, nontender. BS +, no masses or bruits. No hepatomegaly, no splenomegaly EXT:  2 + pulses throughout, no edema, no cyanosis no clubbing SKIN:  Warm and dry.  No rashes NEURO:  Alert and oriented x 3. Cranial nerves II through XII intact. PSYCH:  Cognitively intact    LABORATORY DATA:   Lab Results  Component Value Date   WBC 6.0 11/27/2016   HGB 13.0 11/27/2016   HCT 39.9 11/27/2016   PLT 166.0 11/27/2016   GLUCOSE 103 (H) 11/27/2016  CHOL 138 11/13/2016   TRIG 195.0 (H) 11/13/2016   HDL 26.70 (L) 11/13/2016   LDLDIRECT 74.0 12/17/2014   LDLCALC 73 11/13/2016   ALT 18 10/12/2016   AST 18 10/12/2016   NA 138 11/27/2016   K 4.4 11/27/2016   CL 103 11/27/2016   CREATININE 1.30 11/27/2016   BUN 28 (H) 11/27/2016   CO2 27 11/27/2016   TSH 2.786 10/12/2016   PSA 0.27 12/17/2014   INR 0.9 09/08/2016   HGBA1C 5.1 10/12/2016    Dated 05/27/17: cholesterol 151, triglycerides 129, HDL 57, LDL 69. CBC, TSH, CMET, PSA all normal.   Myoview 09/03/16: Study Highlights     The left ventricular ejection fraction is moderately decreased (30-44%).  Nuclear stress EF: 37%.  There was no ST segment deviation noted during stress.  This is an intermediate risk study.   1. EF 37%, diffuse hypokinesis.  2. Partially reversible medium-sized, moderate intensity basal inferior and inferoseptal and mid inferior perfusion defect. This suggests prior infarction with significant peri-infarct ischemia.   Intermediate risk study.     Cardiac cath/PCI 09/09/16: Procedures   CORONARY STENT INTERVENTION  LEFT HEART CATH AND CORONARY ANGIOGRAPHY  Conclusion     Mid LAD to Dist LAD lesion, 30 %stenosed.  Prox Cx lesion, 25 %stenosed.  Mid RCA lesion, 95 %stenosed.  A STENT SIERRA 3.50 X 23 MM drug  eluting stent was successfully placed.  Post intervention, there is a 0% residual stenosis.  Mid Cx lesion, 70 %stenosed.  A STENT SIERRA 2.75 X 12 MM drug eluting stent was successfully placed.  Post intervention, there is a 0% residual stenosis.  Ost 1st Mrg lesion, 90 %stenosed.  A STENT SIERRA 3.00 X 12 MM drug eluting stent was successfully placed.  Post intervention, there is a 0% residual stenosis.   1. Severe 2 vessel obstructive CAD    - 90% ostial OM1    - 70% distal LCx/OM2    - 95% mid RCA 2. Successful stenting of the mid RCA with DES 3. Successful stenting of the ostium of OM1 with DES 4. Successful stenting of the distal LCx/OM2  Plan: DAPT for at least one year. Anticipate DC in am.    Indications   Angina pectoris (HCC) [I20.9 (ICD-10-CM)]  Procedural Details/Technique   Technical Details Indication: 77 yo WM with remote stent of the LCx presents with angina pectoris and abnormal Myoview study  Procedural Details: The right wrist was prepped, draped, and anesthetized with 1% lidocaine. Using the modified Seldinger technique, a 6 French slender sheath was introduced into the right radial artery. 3 mg of verapamil was administered through the sheath, weight-based unfractionated heparin was administered intravenously. Standard Judkins catheters were used for selective coronary angiography. Due to tortuosity of the innominate artery I was unable to cross the AV with a pigtail catheter. Catheter exchanges were performed over an exchange length guidewire. Following diagnostic angiography we proceeded with multivessel PCI as noted below. There were no immediate procedural complications. A TR band was used for radial hemostasis at the completion of the procedure. The patient was transferred to the post catheterization recovery area for further monitoring.  Contrast: 250 cc   Estimated blood loss <50 mL.  During this procedure the patient was administered the  following to achieve and maintain moderate conscious sedation: Versed 1 mg, Fentanyl 25 mcg, while the patient's heart rate, blood pressure, and oxygen saturation were continuously monitored. The period of conscious sedation was 110 minutes, of which I was  present face-to-face 100% of this time.    Complications   Complications documented before study signed (09/09/2016 4:05 PM EDT)    No complications were associated with this study.  Documented by [name], [name] M, MD - 09/09/2016 4:01 PM EDT    Coronary Findings   Dominance: Right  Left Anterior Descending  Mid LAD to Dist LAD lesion, 30% stenosed. The lesion is segmental.  Left Circumflex  Prox Cx lesion, 25% stenosed. The lesion was previously treated using a bare metal stent over 2 years ago.  Mid Cx lesion, 70% stenosed.  Angioplasty: Lesion crossed with guidewire using a WIRE ASAHI PROWATER 180CM. Pre-stent angioplasty was performed using a BALLOON SAPPHIRE 2.5X12. A STENT SIERRA 2.75 X 12 MM drug eluting stent was successfully placed. Stent strut is well apposed. Post-stent angioplasty was performed using a BALLOON [place] EMERGE MR 3.0X8. Maximum pressure: 16 atm. The pre-interventional distal flow is normal (TIMI 3). The post-interventional distal flow is normal (TIMI 3). The intervention was successful . No complications occurred at this lesion.  There is no residual stenosis post intervention.  First Obtuse Marginal Branch  Vessel is large in size.  Ost 1st Mrg lesion, 90% stenosed.  Angioplasty: Lesion crossed with guidewire using a BALLOON SAPPHIRE 2.5X12. A STENT SIERRA 3.00 X 12 MM drug eluting stent was successfully placed. Stent strut is well apposed. Post-stent angioplasty was performed using a BALLOON SAPPHIRE [place] 3.5X8. Maximum pressure: 16 atm. The pre-interventional distal flow is normal (TIMI 3). The post-interventional distal flow is normal (TIMI 3). The intervention was successful . No complications occurred at this lesion.  The OM 1 lesion was at the ostium. It had previously been stented across with BMS. The lesion was wired through the stent struts. The lesion was stented at the ostium ( T stent ). After it was postdilated there was some plaque shift into the LCx distal to the lesion but within the old stent. This was dilated with a 3.25 mm balloon with good result.  There is no residual stenosis post intervention.  Right Coronary Artery  Vessel is large.  Mid RCA lesion, 95% stenosed.  Angioplasty: Lesion crossed with guidewire using a WIRE ASAHI PROWATER 180CM. Pre-stent angioplasty was performed using a BALLOON SAPPHIRE 2.5X12. Maximum pressure: 8 atm. A STENT SIERRA 3.50 X 23 MM drug eluting stent was successfully placed. Stent strut is well apposed. Post-stent angioplasty was performed using a BALLOON [place] EMERGE MR 4.0X15. Maximum pressure: 16 atm. The pre-interventional distal flow is normal (TIMI 3). The post-interventional distal flow is normal (TIMI 3). The intervention was successful . No complications occurred at this lesion. Unable to engage the RCA with a LA guide. HS guide engaged well but was inadequate to support delivery of stent to lesion. Holly Bodily used for additional support with good results.  There is no residual stenosis post intervention.  Coronary Diagrams   Diagnostic Diagram       Post-Intervention Diagram          Echo 09/16/16: Study Conclusions  - Left ventricle: The cavity size was normal. Wall thickness was   increased in a pattern of severe LVH. Systolic function was   normal. The estimated ejection fraction was in the range of 55%   to 60%. Wall motion was normal; there were no regional wall   motion abnormalities. Doppler parameters are consistent with   abnormal left ventricular relaxation (grade 1 diastolic   dysfunction). - Aortic valve: Trileaflet; moderately calcified leaflets.   Sclerosis without stenosis. -  Aorta: Mildly dilated aortic root. Aortic root  dimension: 38 mm   (ED). - Mitral valve: Mildly calcified annulus. There was no significant   regurgitation. - Right ventricle: The cavity size was normal. Systolic function   was normal. - Tricuspid valve: Peak RV-RA gradient (S): 22 mm Hg. - Pulmonary arteries: PA peak pressure: 25 mm Hg (S). - Inferior vena cava: The vessel was normal in size. The   respirophasic diameter changes were in the normal range (= 50%),   consistent with normal central venous pressure.  Impressions:  - Normal LV size with severe LV hypertrophy. EF 55-60%. Normal RV   size and systolic function. Aortic valve sclerosis without   significant stenosis.  Echo 09/16/16: Study Conclusions  - Left ventricle: The cavity size was normal. Wall thickness was   increased in a pattern of severe LVH. Systolic function was   normal. The estimated ejection fraction was in the range of 55%   to 60%. Wall motion was normal; there were no regional wall   motion abnormalities. Doppler parameters are consistent with   abnormal left ventricular relaxation (grade 1 diastolic   dysfunction). - Aortic valve: Trileaflet; moderately calcified leaflets.   Sclerosis without stenosis. - Aorta: Mildly dilated aortic root. Aortic root dimension: 38 mm   (ED). - Mitral valve: Mildly calcified annulus. There was no significant   regurgitation. - Right ventricle: The cavity size was normal. Systolic function   was normal. - Tricuspid valve: Peak RV-RA gradient (S): 22 mm Hg. - Pulmonary arteries: PA peak pressure: 25 mm Hg (S). - Inferior vena cava: The vessel was normal in size. The   respirophasic diameter changes were in the normal range (= 50%),   consistent with normal central venous pressure.  Impressions:  - Normal LV size with severe LV hypertrophy. EF 55-60%. Normal RV   size and systolic function. Aortic valve sclerosis without   significant stenosis.  Assessment / Plan: 1. Coronary disease with prior stenting  of the left circumflex coronary in 2009 with a bare-metal stent. Now s/p stenting of the first OM, distal LCx and mid RCA with DES in August 2018.   Continue DAPT with ASA and Plavix. Given extensive amount of stenting he has had done I would favor continuing DAPT indefinitely unless he has bleeding issues.   Bystolic 10 mg daily. On statin.  2. 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 No CHF. - well controlled on Bystolic and lisinopril.  3. Dyslipidemia. Intolerant to statins in the past. With recent stenting and CVA he is now on lipitor 40 mg daily and is tolerating this well. LDL is at goal < 70. 4. 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 S/p CVA.  6. LV function noted on Myoview with EF 37%. By Echo done  post revascularization EF  normal. Continue Bystolic and lisinopril.   I will follow up in 6 months.

## 2017-06-01 ENCOUNTER — Ambulatory Visit: Payer: Medicare Other | Admitting: Cardiology

## 2017-06-01 ENCOUNTER — Encounter: Payer: Self-pay | Admitting: Cardiology

## 2017-06-01 VITALS — BP 122/60 | HR 65 | Ht 72.0 in | Wt 207.2 lb

## 2017-06-01 DIAGNOSIS — I2510 Atherosclerotic heart disease of native coronary artery without angina pectoris: Secondary | ICD-10-CM | POA: Diagnosis not present

## 2017-06-01 DIAGNOSIS — I10 Essential (primary) hypertension: Secondary | ICD-10-CM | POA: Diagnosis not present

## 2017-06-01 DIAGNOSIS — I119 Hypertensive heart disease without heart failure: Secondary | ICD-10-CM | POA: Diagnosis not present

## 2017-06-01 MED ORDER — CLOPIDOGREL BISULFATE 75 MG PO TABS: 75.0000 mg | ORAL_TABLET | Freq: Every day | ORAL | 3 refills | Status: DC

## 2017-06-01 NOTE — Patient Instructions (Signed)
Continue your current therapy  I will see you in 6 months.   

## 2017-07-24 ENCOUNTER — Other Ambulatory Visit: Payer: Self-pay | Admitting: Cardiology

## 2017-07-26 NOTE — Telephone Encounter (Signed)
Rx has been sent to the pharmacy electronically. ° °

## 2017-08-16 ENCOUNTER — Other Ambulatory Visit: Payer: Self-pay | Admitting: Cardiology

## 2017-09-08 ENCOUNTER — Other Ambulatory Visit: Payer: Self-pay | Admitting: Cardiology

## 2017-09-09 NOTE — Telephone Encounter (Signed)
Rx sent to pharmacy   

## 2017-09-17 ENCOUNTER — Telehealth: Payer: Self-pay | Admitting: Cardiology

## 2017-09-17 NOTE — Telephone Encounter (Signed)
New Message:    Pt wife is requesting a call back

## 2017-09-17 NOTE — Telephone Encounter (Signed)
Returned call to patient's wife.She stated husband has just not been feeling good,occasional chest pain for the past week.No chest pain at present.Stated he wants to see Dr.Jordan only.Appointment scheduled with Dr.Jordan 09/22/17 at 4:20 pm.Advised to go to ED if needed.

## 2017-09-18 NOTE — Progress Notes (Signed)
Colin Lopez Date of Birth: 1940/10/09 Medical Record #[id number]  History of Present Illness: [name] is seen for follow up CAD.  He has a history of coronary disease with a non-ST elevation myocardial infarction in 2009. He underwent stenting of the left circumflex coronary with a 3.5 x 16 mm Liberte stent. He does have a history of hypertension and hyperlipidemia. He is intolerant to Crestor.  He was seen in February 2018 with some atypical left shoulder and neck pain. He was tachycardic. A Myoview study was ordered but was never done. He had a CT chest in April which was negative for PE. There was a rounded density in the LLL of unclear etiology. Later PET scan in June showed low metabolic activity and low risk.  He noted severe symptoms of bad indigestion over 2 months with burning in his throat and then bad chest pressure like it is going to explode after eating. He underwent a Lexiscan myoview study which showed multiple perfusion defects and EF 37%. This led to a cardiac cath in late August 2018  that showed 3 vessel disease with successful management with DES x 3. Unable to assess LV function at cath.   He was admitted in late September 2018. 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 No intracranial branch vessel disease.  - recent 2-D echo on 09/16/16 showed EF of 55-60% with grade 1 diastolic dysfunction, no wall motion abnormalities.  - Neurology consulted, continue aspirin, Plavix.   He recently called stating he doesn't feel well and has occasional chest pain. He describes this as more indigestion. States he has been working a lot in his shed where  temperatures are over 100 degrees. 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 He is under a lot of stress.    Current Outpatient Medications on File Prior to Visit  Medication Sig Dispense Refill  . acetaminophen (TYLENOL) 500 MG tablet Take 1,000 mg by mouth every 6 (six) hours as needed for headache (pain).    [name] aspirin EC 81 MG tablet Take 1 tablet (81 mg total) by mouth daily. 90 tablet 3  . atorvastatin (LIPITOR) 40 MG tablet Take 1 tablet (40 mg total) by mouth at bedtime. 30 tablet 6  . BYSTOLIC 10 MG tablet TAKE 1 TABLET BY MOUTH ONCE DAILY 90 tablet 1  . clopidogrel (PLAVIX) 75 MG tablet Take 1 tablet (75 mg total) by mouth daily. 90 tablet 3  . fenofibrate 160 MG tablet TAKE 1 TABLET BY MOUTH ONCE DAILY 90 tablet 2  . Krill Oil 1000 MG CAPS Take 1,000 mg by mouth at bedtime.    [name] lisinopril (PRINIVIL,ZESTRIL) 10 MG tablet Take 1 tablet (10 mg total) by mouth daily. 30 tablet 6  . nitroGLYCERIN (NITROSTAT) 0.4 MG SL tablet Place 1 tablet (0.4 mg total) under the tongue every 5 (five) minutes as needed for chest pain. 25 tablet 3  . OVER THE COUNTER MEDICATION Apply 1 application topically daily as needed (pain). Horse Liniment otc cream    . pantoprazole (PROTONIX) 40 MG tablet TAKE 1 TABLET BY MOUTH ONCE DAILY 30 tablet 6   No current facility-administered medications on file prior to visit.  Allergies  Allergen Reactions  . Penicillins Hives    Has patient had a PCN reaction causing immediate rash, facial/tongue/throat swelling, SOB or lightheadedness with hypotension: No Has patient had a PCN reaction causing severe rash involving mucus membranes or skin necrosis: Yes Has patient had a PCN reaction that required hospitalization: No Has patient had a PCN reaction occurring within the last 10 years: No If all of the above answers are "NO", then may proceed with Cephalosporin use.   [name] Doxycycline Rash    Water blisters  . Levaquin [Levofloxacin In D5w] Rash     Past Medical History:  Diagnosis Date  . CAD (coronary artery disease), native coronary artery    09/09/16 PCI/DES x3 to mRCA, OM1 and dLcx/OM2.   . Coronary disease    Status post stenting of the left circumflex coronary in 2009 with a bare-metal stent (with a 3.5x84m Liberte stent)  . Dyslipidemia   . 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 Hyperlipidemia   . Hypertension   . NSTEMI (non-ST elevated myocardial infarction) ([facility] 2009   BMS CFX  . Stroke ([facility]   . TIA (transient ischemic attack)    history of tia    Past Surgical History:  Procedure Laterality Date  . CARDIOVASCULAR STRESS TEST  10-03-08   EF 59%  . CORONARY ANGIOPLASTY WITH STENT PLACEMENT  09/09/2016  . CORONARY STENT INTERVENTION N/A 09/09/2016   Procedure: CORONARY STENT INTERVENTION;  Surgeon: JMartinique [name] M, MD;  [facility];  Service: Cardiovascular;  Laterality: N/A;  . LEFT HEART CATH AND CORONARY ANGIOGRAPHY N/A 09/09/2016   Procedure: LEFT HEART CATH AND CORONARY ANGIOGRAPHY;  Surgeon: JMartinique [name] M, MD;  [facility];  Service: Cardiovascular;  Laterality: N/A;  . [place]  09-21-08   EF 55-60%    Social History   Tobacco Use  Smoking Status Never Smoker  Smokeless Tobacco Never Used    Social History   Substance and Sexual Activity  Alcohol Use Yes   Comment: Rarely     Family History  Problem Relation Age of Onset  . Diabetes Mother   . CAD Sister 457      MI, obese  . Cancer Brother        stomach    Review of Systems: As noted in history of present illness.  All other systems were reviewed and are negative.  Physical Exam: BP 122/70 (BP Location: Right Arm, Patient Position: Sitting, Cuff Size: Normal)   Pulse 66   Ht 6' (1.829 m)   Wt 210 lb (95.3 kg)   BMI 28.48 kg/m  GENERAL:  Well appearing WM in NAD HEENT:  PERRL, EOMI, sclera are clear. Oropharynx is clear. NECK:  No jugular venous distention, carotid upstroke brisk and symmetric, no  bruits, no thyromegaly or adenopathy LUNGS:  Clear to auscultation bilaterally CHEST:  Unremarkable HEART:  RRR,  PMI not displaced or sustained,S1 and S2 within normal limits, no S3, no S4: no clicks, no rubs, no murmurs ABD:  Soft, nontender. BS +, no masses or bruits. No hepatomegaly, no splenomegaly EXT:  2 + pulses throughout, no edema, no cyanosis no clubbing SKIN:  Warm and dry.  No rashes NEURO:  Alert and oriented x 3. Cranial nerves II through XII intact. PSYCH:  Cognitively intact      LABORATORY DATA:   Lab Results  Component Value Date   WBC 6.0 11/27/2016   HGB 13.0 11/27/2016   HCT 39.9 11/27/2016  PLT 166.0 11/27/2016   GLUCOSE 103 (H) 11/27/2016   CHOL 138 11/13/2016   TRIG 195.0 (H) 11/13/2016   HDL 26.70 (L) 11/13/2016   LDLDIRECT 74.0 12/17/2014   LDLCALC 73 11/13/2016   ALT 18 10/12/2016   AST 18 10/12/2016   NA 138 11/27/2016   K 4.4 11/27/2016   CL 103 11/27/2016   CREATININE 1.30 11/27/2016   BUN 28 (H) 11/27/2016   CO2 27 11/27/2016   TSH 2.786 10/12/2016   PSA 0.27 12/17/2014   INR 0.9 09/08/2016   HGBA1C 5.1 10/12/2016    Dated 05/27/17: cholesterol 151, triglycerides 129, HDL 57, LDL 69. CBC, TSH, CMET, PSA all normal.  Ecg today shows NSR normal Ecg. I have personally reviewed and interpreted this study.    Myoview 09/03/16: Study Highlights     The left ventricular ejection fraction is moderately decreased (30-44%).  Nuclear stress EF: 37%.  There was no ST segment deviation noted during stress.  This is an intermediate risk study.   1. EF 37%, diffuse hypokinesis.  2. Partially reversible medium-sized, moderate intensity basal inferior and inferoseptal and mid inferior perfusion defect. This suggests prior infarction with significant peri-infarct ischemia.   Intermediate risk study.     Cardiac cath/PCI 09/09/16: Procedures   CORONARY STENT INTERVENTION  LEFT HEART CATH AND CORONARY ANGIOGRAPHY  Conclusion      Mid LAD to Dist LAD lesion, 30 %stenosed.  Prox Cx lesion, 25 %stenosed.  Mid RCA lesion, 95 %stenosed.  A STENT SIERRA 3.50 X 23 MM drug eluting stent was successfully placed.  Post intervention, there is a 0% residual stenosis.  Mid Cx lesion, 70 %stenosed.  A STENT SIERRA 2.75 X 12 MM drug eluting stent was successfully placed.  Post intervention, there is a 0% residual stenosis.  Ost 1st Mrg lesion, 90 %stenosed.  A STENT SIERRA 3.00 X 12 MM drug eluting stent was successfully placed.  Post intervention, there is a 0% residual stenosis.   1. Severe 2 vessel obstructive CAD    - 90% ostial OM1    - 70% distal LCx/OM2    - 95% mid RCA 2. Successful stenting of the mid RCA with DES 3. Successful stenting of the ostium of OM1 with DES 4. Successful stenting of the distal LCx/OM2  Plan: DAPT for at least one year. Anticipate DC in am.    Indications   Angina pectoris (HCC) [I20.9 (ICD-10-CM)]  Procedural Details/Technique   Technical Details Indication: 77 yo WM with remote stent of the LCx presents with angina pectoris and abnormal Myoview study  Procedural Details: The right wrist was prepped, draped, and anesthetized with 1% lidocaine. Using the modified Seldinger technique, a 6 French slender sheath was introduced into the right radial artery. 3 mg of verapamil was administered through the sheath, weight-based unfractionated heparin was administered intravenously. Standard Judkins catheters were used for selective coronary angiography. Due to tortuosity of the innominate artery I was unable to cross the AV with a pigtail catheter. Catheter exchanges were performed over an exchange length guidewire. Following diagnostic angiography we proceeded with multivessel PCI as noted below. There were no immediate procedural complications. A TR band was used for radial hemostasis at the completion of the procedure. The patient was transferred to the post catheterization  recovery area for further monitoring.  Contrast: 250 cc   Estimated blood loss <50 mL.  During this procedure the patient was administered the following to achieve and maintain moderate conscious sedation: Versed 1 mg, Fentanyl  25 mcg, while the patient's heart rate, blood pressure, and oxygen saturation were continuously monitored. The period of conscious sedation was 110 minutes, of which I was present face-to-face 100% of this time.    Complications   Complications documented before study signed (09/09/2016 4:05 PM EDT)    No complications were associated with this study.  Documented by [name], [name] M, MD - 09/09/2016 4:01 PM EDT    Coronary Findings   Dominance: Right  Left Anterior Descending  Mid LAD to Dist LAD lesion, 30% stenosed. The lesion is segmental.  Left Circumflex  Prox Cx lesion, 25% stenosed. The lesion was previously treated using a bare metal stent over 2 years ago.  Mid Cx lesion, 70% stenosed.  Angioplasty: Lesion crossed with guidewire using a WIRE ASAHI PROWATER 180CM. Pre-stent angioplasty was performed using a BALLOON SAPPHIRE 2.5X12. A STENT SIERRA 2.75 X 12 MM drug eluting stent was successfully placed. Stent strut is well apposed. Post-stent angioplasty was performed using a BALLOON [place] EMERGE MR 3.0X8. Maximum pressure: 16 atm. The pre-interventional distal flow is normal (TIMI 3). The post-interventional distal flow is normal (TIMI 3). The intervention was successful . No complications occurred at this lesion.  There is no residual stenosis post intervention.  First Obtuse Marginal Branch  Vessel is large in size.  Ost 1st Mrg lesion, 90% stenosed.  Angioplasty: Lesion crossed with guidewire using a BALLOON SAPPHIRE 2.5X12. A STENT SIERRA 3.00 X 12 MM drug eluting stent was successfully placed. Stent strut is well apposed. Post-stent angioplasty was performed using a BALLOON SAPPHIRE [place] 3.5X8. Maximum pressure: 16 atm. The pre-interventional distal flow is  normal (TIMI 3). The post-interventional distal flow is normal (TIMI 3). The intervention was successful . No complications occurred at this lesion. The OM 1 lesion was at the ostium. It had previously been stented across with BMS. The lesion was wired through the stent struts. The lesion was stented at the ostium ( T stent ). After it was postdilated there was some plaque shift into the LCx distal to the lesion but within the old stent. This was dilated with a 3.25 mm balloon with good result.  There is no residual stenosis post intervention.  Right Coronary Artery  Vessel is large.  Mid RCA lesion, 95% stenosed.  Angioplasty: Lesion crossed with guidewire using a WIRE ASAHI PROWATER 180CM. Pre-stent angioplasty was performed using a BALLOON SAPPHIRE 2.5X12. Maximum pressure: 8 atm. A STENT SIERRA 3.50 X 23 MM drug eluting stent was successfully placed. Stent strut is well apposed. Post-stent angioplasty was performed using a BALLOON [place] EMERGE MR 4.0X15. Maximum pressure: 16 atm. The pre-interventional distal flow is normal (TIMI 3). The post-interventional distal flow is normal (TIMI 3). The intervention was successful . No complications occurred at this lesion. Unable to engage the RCA with a LA guide. HS guide engaged well but was inadequate to support delivery of stent to lesion. Holly Bodily used for additional support with good results.  There is no residual stenosis post intervention.  Coronary Diagrams   Diagnostic Diagram       Post-Intervention Diagram          Echo 09/16/16: Study Conclusions  - Left ventricle: The cavity size was normal. Wall thickness was   increased in a pattern of severe LVH. Systolic function was   normal. The estimated ejection fraction was in the range of 55%   to 60%. Wall motion was normal; there were no regional wall   motion abnormalities. Doppler parameters  are consistent with   abnormal left ventricular relaxation (grade 1 diastolic    dysfunction). - Aortic valve: Trileaflet; moderately calcified leaflets.   Sclerosis without stenosis. - Aorta: Mildly dilated aortic root. Aortic root dimension: 38 mm   (ED). - Mitral valve: Mildly calcified annulus. There was no significant   regurgitation. - Right ventricle: The cavity size was normal. Systolic function   was normal. - Tricuspid valve: Peak RV-RA gradient (S): 22 mm Hg. - Pulmonary arteries: PA peak pressure: 25 mm Hg (S). - Inferior vena cava: The vessel was normal in size. The   respirophasic diameter changes were in the normal range (= 50%),   consistent with normal central venous pressure.  Impressions:  - Normal LV size with severe LV hypertrophy. EF 55-60%. Normal RV   size and systolic function. Aortic valve sclerosis without   significant stenosis.  Echo 09/16/16: Study Conclusions  - Left ventricle: The cavity size was normal. Wall thickness was   increased in a pattern of severe LVH. Systolic function was   normal. The estimated ejection fraction was in the range of 55%   to 60%. Wall motion was normal; there were no regional wall   motion abnormalities. Doppler parameters are consistent with   abnormal left ventricular relaxation (grade 1 diastolic   dysfunction). - Aortic valve: Trileaflet; moderately calcified leaflets.   Sclerosis without stenosis. - Aorta: Mildly dilated aortic root. Aortic root dimension: 38 mm   (ED). - Mitral valve: Mildly calcified annulus. There was no significant   regurgitation. - Right ventricle: The cavity size was normal. Systolic function   was normal. - Tricuspid valve: Peak RV-RA gradient (S): 22 mm Hg. - Pulmonary arteries: PA peak pressure: 25 mm Hg (S). - Inferior vena cava: The vessel was normal in size. The   respirophasic diameter changes were in the normal range (= 50%),   consistent with normal central venous pressure.  Impressions:  - Normal LV size with severe LV hypertrophy. EF 55-60%. Normal  RV   size and systolic function. Aortic valve sclerosis without   significant stenosis.  Assessment / Plan: 1. Coronary disease with prior stenting of the left circumflex coronary in 2009 with a bare-metal stent. Now s/p stenting of the first OM, distal LCx and mid RCA with DES in August 2018.  He has atypical chest pain probably exacerbated by heat stress.  Continue DAPT with ASA and Plavix. Given extensive amount of stenting he has had done I would favor continuing DAPT indefinitely unless he has bleeding issues.   Bystolic 10 mg daily. On statin. Maintain good hydration and limit heat exposure.  2. 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 No CHF. - well controlled on Bystolic and lisinopril.  3. Dyslipidemia. Intolerant to statins in the past. With recent stenting and CVA he is now on lipitor 40 mg daily and is tolerating this well. LDL is at goal < 70. 4. 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 S/p CVA.  6. LV function noted on Myoview with EF 37%. By Echo done  post revascularization EF  normal. Continue Bystolic and lisinopril.   I will follow up in 6 months.

## 2017-09-22 ENCOUNTER — Encounter: Payer: Self-pay | Admitting: Cardiology

## 2017-09-22 ENCOUNTER — Ambulatory Visit: Payer: Medicare Other | Admitting: Cardiology

## 2017-09-22 VITALS — BP 122/70 | HR 66 | Ht 72.0 in | Wt 210.0 lb

## 2017-09-22 DIAGNOSIS — I119 Hypertensive heart disease without heart failure: Secondary | ICD-10-CM | POA: Diagnosis not present

## 2017-09-22 DIAGNOSIS — I10 Essential (primary) hypertension: Secondary | ICD-10-CM | POA: Diagnosis not present

## 2017-09-22 DIAGNOSIS — E785 Hyperlipidemia, unspecified: Secondary | ICD-10-CM

## 2017-09-22 DIAGNOSIS — I2510 Atherosclerotic heart disease of native coronary artery without angina pectoris: Secondary | ICD-10-CM | POA: Diagnosis not present

## 2017-09-22 NOTE — Patient Instructions (Signed)
Continue your current therapy  I will see you in 6 months.   

## 2017-12-08 NOTE — Progress Notes (Deleted)
Colin Lopez Date of Birth: [birth year] Medical Record #[id number]  History of Present Illness: Colin Lopez is seen for follow up CAD.  He has a history of coronary disease with a non-ST elevation myocardial infarction in 2009. He underwent stenting of the left circumflex coronary with a 3.5 x 16 mm Liberte stent. He does have a history of hypertension and hyperlipidemia. He is intolerant to Crestor.  He was seen in February 2018 with some atypical left shoulder and neck pain. He was tachycardic. A Myoview study was ordered but was never done. He had a CT chest in April which was negative for PE. There was a rounded density in the LLL of unclear etiology. Later PET scan in June showed low metabolic activity and low risk.  He noted severe symptoms of bad indigestion over 2 months with burning in his throat and then bad chest pressure like it is going to explode after eating. He underwent a Lexiscan myoview study which showed multiple perfusion defects and EF 37%. This led to a cardiac cath in late August 2018  that showed 3 vessel disease with successful management with DES x 3. Unable to assess LV function at cath.   He was admitted in late September 2018. 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 No intracranial branch vessel disease.  - recent 2-D echo on 09/16/16 showed EF of 55-60% with grade 1 diastolic dysfunction, no wall motion abnormalities.  - Neurology consulted, continue aspirin, Plavix.   He recently called stating he doesn't feel well and has occasional chest pain. He describes this as more indigestion. States he has been working a lot in his shed where  temperatures are over 100 degrees. 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 He is under a lot of stress.    Current Outpatient Medications on File Prior to Visit  Medication Sig Dispense Refill  . acetaminophen (TYLENOL) 500 MG tablet Take 1,000 mg by mouth every 6 (six) hours as needed for headache (pain).    [name] aspirin EC 81 MG tablet Take 1 tablet (81 mg total) by mouth daily. 90 tablet 3  . atorvastatin (LIPITOR) 40 MG tablet Take 1 tablet (40 mg total) by mouth at bedtime. 30 tablet 6  . BYSTOLIC 10 MG tablet TAKE 1 TABLET BY MOUTH ONCE DAILY 90 tablet 1  . clopidogrel (PLAVIX) 75 MG tablet Take 1 tablet (75 mg total) by mouth daily. 90 tablet 3  . fenofibrate 160 MG tablet TAKE 1 TABLET BY MOUTH ONCE DAILY 90 tablet 2  . Krill Oil 1000 MG CAPS Take 1,000 mg by mouth at bedtime.    [name] lisinopril (PRINIVIL,ZESTRIL) 10 MG tablet Take 1 tablet (10 mg total) by mouth daily. 30 tablet 6  . nitroGLYCERIN (NITROSTAT) 0.4 MG SL tablet Place 1 tablet (0.4 mg total) under the tongue every 5 (five) minutes as needed for chest pain. 25 tablet 3  . OVER THE COUNTER MEDICATION Apply 1 application topically daily as needed (pain). Horse Liniment otc cream    . pantoprazole (PROTONIX) 40 MG tablet TAKE 1 TABLET BY MOUTH ONCE DAILY 30 tablet 6   No current facility-administered medications on file prior to visit.  Allergies  Allergen Reactions  . Penicillins Hives    Has patient had a PCN reaction causing immediate rash, facial/tongue/throat swelling, SOB or lightheadedness with hypotension: No Has patient had a PCN reaction causing severe rash involving mucus membranes or skin necrosis: Yes Has patient had a PCN reaction that required hospitalization: No Has patient had a PCN reaction occurring within the last 10 years: No If all of the above answers are "NO", then may proceed with Cephalosporin use.   [name] Doxycycline Rash    Water blisters  . Levaquin [Levofloxacin In D5w] Rash     Past Medical History:  Diagnosis Date  . CAD (coronary artery disease), native coronary artery    09/09/16 PCI/DES x3 to mRCA, OM1 and dLcx/OM2.   . Coronary disease    Status post stenting of the left circumflex coronary in 2009 with a bare-metal stent (with a 3.5x27m Liberte stent)  . Dyslipidemia   . 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 Hyperlipidemia   . Hypertension   . [place]   BMS CFX  . Stroke ([facility]   . TIA (transient ischemic attack)    history of tia    Past Surgical History:  Procedure Laterality Date  . CARDIOVASCULAR STRESS TEST  10-03-08   EF 59%  . CORONARY ANGIOPLASTY WITH STENT PLACEMENT  09/09/2016  . CORONARY STENT INTERVENTION N/A 09/09/2016   Procedure: CORONARY STENT INTERVENTION;  Surgeon: JMartinique [name] M, MD;  [facility];  Service: Cardiovascular;  Laterality: N/A;  . LEFT HEART CATH AND CORONARY ANGIOGRAPHY N/A 09/09/2016   Procedure: LEFT HEART CATH AND CORONARY ANGIOGRAPHY;  Surgeon: JMartinique [name] M, MD;  [facility];  Service: Cardiovascular;  Laterality: N/A;  . [place]  09-21-08   EF 55-60%    Social History   Tobacco Use  Smoking Status Never Smoker  Smokeless Tobacco Never Used    Social History   Substance and Sexual Activity  Alcohol Use Yes   Comment: Rarely     Family History  Problem Relation Age of Onset  . Diabetes Mother   . CAD Sister 417      MI, obese  . Cancer Brother        stomach    Review of Systems: As noted in history of present illness.  All other systems were reviewed and are negative.  Physical Exam: There were no vitals taken for this visit. GENERAL:  Well appearing WM in NAD HEENT:  PERRL, EOMI, sclera are clear. Oropharynx is clear. NECK:  No jugular venous distention, carotid upstroke brisk and symmetric, no bruits, no thyromegaly or adenopathy LUNGS:  Clear to auscultation bilaterally CHEST:  Unremarkable HEART:   RRR,  PMI not displaced or sustained,S1 and S2 within normal limits, no S3, no S4: no clicks, no rubs, no murmurs ABD:  Soft, nontender. BS +, no masses or bruits. No hepatomegaly, no splenomegaly EXT:  2 + pulses throughout, no edema, no cyanosis no clubbing SKIN:  Warm and dry.  No rashes NEURO:  Alert and oriented x 3. Cranial nerves II through XII intact. PSYCH:  Cognitively intact      LABORATORY DATA:   Lab Results  Component Value Date   WBC 6.0 11/27/2016   HGB 13.0 11/27/2016   HCT 39.9 11/27/2016   PLT 166.0 11/27/2016   GLUCOSE 103 (H) 11/27/2016   CHOL 138 11/13/2016   TRIG 195.0 (H) 11/13/2016   HDL 26.70 (L)  11/13/2016   LDLDIRECT 74.0 12/17/2014   LDLCALC 73 11/13/2016   ALT 18 10/12/2016   AST 18 10/12/2016   NA 138 11/27/2016   K 4.4 11/27/2016   CL 103 11/27/2016   CREATININE 1.30 11/27/2016   BUN 28 (H) 11/27/2016   CO2 27 11/27/2016   TSH 2.786 10/12/2016   PSA 0.27 12/17/2014   INR 0.9 09/08/2016   HGBA1C 5.1 10/12/2016    Dated 05/27/17: cholesterol 151, triglycerides 129, HDL 57, LDL 69. CBC, TSH, CMET, PSA all normal.  Ecg today shows NSR normal Ecg. I have personally reviewed and interpreted this study.    Myoview 09/03/16: Study Highlights     The left ventricular ejection fraction is moderately decreased (30-44%).  Nuclear stress EF: 37%.  There was no ST segment deviation noted during stress.  This is an intermediate risk study.   1. EF 37%, diffuse hypokinesis.  2. Partially reversible medium-sized, moderate intensity basal inferior and inferoseptal and mid inferior perfusion defect. This suggests prior infarction with significant peri-infarct ischemia.   Intermediate risk study.     Cardiac cath/PCI 09/09/16: Procedures   CORONARY STENT INTERVENTION  LEFT HEART CATH AND CORONARY ANGIOGRAPHY  Conclusion     Mid LAD to Dist LAD lesion, 30 %stenosed.  Prox Cx lesion, 25 %stenosed.  Mid RCA lesion, 95  %stenosed.  A STENT SIERRA 3.50 X 23 MM drug eluting stent was successfully placed.  Post intervention, there is a 0% residual stenosis.  Mid Cx lesion, 70 %stenosed.  A STENT SIERRA 2.75 X 12 MM drug eluting stent was successfully placed.  Post intervention, there is a 0% residual stenosis.  Ost 1st Mrg lesion, 90 %stenosed.  A STENT SIERRA 3.00 X 12 MM drug eluting stent was successfully placed.  Post intervention, there is a 0% residual stenosis.   1. Severe 2 vessel obstructive CAD    - 90% ostial OM1    - 70% distal LCx/OM2    - 95% mid RCA 2. Successful stenting of the mid RCA with DES 3. Successful stenting of the ostium of OM1 with DES 4. Successful stenting of the distal LCx/OM2  Plan: DAPT for at least one year. Anticipate DC in am.    Indications   Angina pectoris (HCC) [I20.9 (ICD-10-CM)]  Procedural Details/Technique   Technical Details Indication: 77 yo WM with remote stent of the LCx presents with angina pectoris and abnormal Myoview study  Procedural Details: The right wrist was prepped, draped, and anesthetized with 1% lidocaine. Using the modified Seldinger technique, a 6 French slender sheath was introduced into the right radial artery. 3 mg of verapamil was administered through the sheath, weight-based unfractionated heparin was administered intravenously. Standard Judkins catheters were used for selective coronary angiography. Due to tortuosity of the innominate artery I was unable to cross the AV with a pigtail catheter. Catheter exchanges were performed over an exchange length guidewire. Following diagnostic angiography we proceeded with multivessel PCI as noted below. There were no immediate procedural complications. A TR band was used for radial hemostasis at the completion of the procedure. The patient was transferred to the post catheterization recovery area for further monitoring.  Contrast: 250 cc   Estimated blood loss <50 mL.  During this  procedure the patient was administered the following to achieve and maintain moderate conscious sedation: Versed 1 mg, Fentanyl 25 mcg, while the patient's heart rate, blood pressure, and oxygen saturation were continuously monitored. The period of conscious sedation was 110 minutes, of which  I was present face-to-face 100% of this time.    Complications   Complications documented before study signed (09/09/2016 4:05 PM EDT)    No complications were associated with this study.  Documented by [name], [name] M, MD - 09/09/2016 4:01 PM EDT    Coronary Findings   Dominance: Right  Left Anterior Descending  Mid LAD to Dist LAD lesion, 30% stenosed. The lesion is segmental.  Left Circumflex  Prox Cx lesion, 25% stenosed. The lesion was previously treated using a bare metal stent over 2 years ago.  Mid Cx lesion, 70% stenosed.  Angioplasty: Lesion crossed with guidewire using a WIRE ASAHI PROWATER 180CM. Pre-stent angioplasty was performed using a BALLOON SAPPHIRE 2.5X12. A STENT SIERRA 2.75 X 12 MM drug eluting stent was successfully placed. Stent strut is well apposed. Post-stent angioplasty was performed using a BALLOON [place] EMERGE MR 3.0X8. Maximum pressure: 16 atm. The pre-interventional distal flow is normal (TIMI 3). The post-interventional distal flow is normal (TIMI 3). The intervention was successful . No complications occurred at this lesion.  There is no residual stenosis post intervention.  First Obtuse Marginal Branch  Vessel is large in size.  Ost 1st Mrg lesion, 90% stenosed.  Angioplasty: Lesion crossed with guidewire using a BALLOON SAPPHIRE 2.5X12. A STENT SIERRA 3.00 X 12 MM drug eluting stent was successfully placed. Stent strut is well apposed. Post-stent angioplasty was performed using a BALLOON SAPPHIRE [place] 3.5X8. Maximum pressure: 16 atm. The pre-interventional distal flow is normal (TIMI 3). The post-interventional distal flow is normal (TIMI 3). The intervention was successful .  No complications occurred at this lesion. The OM 1 lesion was at the ostium. It had previously been stented across with BMS. The lesion was wired through the stent struts. The lesion was stented at the ostium ( T stent ). After it was postdilated there was some plaque shift into the LCx distal to the lesion but within the old stent. This was dilated with a 3.25 mm balloon with good result.  There is no residual stenosis post intervention.  Right Coronary Artery  Vessel is large.  Mid RCA lesion, 95% stenosed.  Angioplasty: Lesion crossed with guidewire using a WIRE ASAHI PROWATER 180CM. Pre-stent angioplasty was performed using a BALLOON SAPPHIRE 2.5X12. Maximum pressure: 8 atm. A STENT SIERRA 3.50 X 23 MM drug eluting stent was successfully placed. Stent strut is well apposed. Post-stent angioplasty was performed using a BALLOON [place] EMERGE MR 4.0X15. Maximum pressure: 16 atm. The pre-interventional distal flow is normal (TIMI 3). The post-interventional distal flow is normal (TIMI 3). The intervention was successful . No complications occurred at this lesion. Unable to engage the RCA with a LA guide. HS guide engaged well but was inadequate to support delivery of stent to lesion. Holly Bodily used for additional support with good results.  There is no residual stenosis post intervention.  Coronary Diagrams   Diagnostic Diagram       Post-Intervention Diagram          Echo 09/16/16: Study Conclusions  - Left ventricle: The cavity size was normal. Wall thickness was   increased in a pattern of severe LVH. Systolic function was   normal. The estimated ejection fraction was in the range of 55%   to 60%. Wall motion was normal; there were no regional wall   motion abnormalities. Doppler parameters are consistent with   abnormal left ventricular relaxation (grade 1 diastolic   dysfunction). - Aortic valve: Trileaflet; moderately calcified leaflets.   Sclerosis  without stenosis. - Aorta: Mildly  dilated aortic root. Aortic root dimension: 38 mm   (ED). - Mitral valve: Mildly calcified annulus. There was no significant   regurgitation. - Right ventricle: The cavity size was normal. Systolic function   was normal. - Tricuspid valve: Peak RV-RA gradient (S): 22 mm Hg. - Pulmonary arteries: PA peak pressure: 25 mm Hg (S). - Inferior vena cava: The vessel was normal in size. The   respirophasic diameter changes were in the normal range (= 50%),   consistent with normal central venous pressure.  Impressions:  - Normal LV size with severe LV hypertrophy. EF 55-60%. Normal RV   size and systolic function. Aortic valve sclerosis without   significant stenosis.  Echo 09/16/16: Study Conclusions  - Left ventricle: The cavity size was normal. Wall thickness was   increased in a pattern of severe LVH. Systolic function was   normal. The estimated ejection fraction was in the range of 55%   to 60%. Wall motion was normal; there were no regional wall   motion abnormalities. Doppler parameters are consistent with   abnormal left ventricular relaxation (grade 1 diastolic   dysfunction). - Aortic valve: Trileaflet; moderately calcified leaflets.   Sclerosis without stenosis. - Aorta: Mildly dilated aortic root. Aortic root dimension: 38 mm   (ED). - Mitral valve: Mildly calcified annulus. There was no significant   regurgitation. - Right ventricle: The cavity size was normal. Systolic function   was normal. - Tricuspid valve: Peak RV-RA gradient (S): 22 mm Hg. - Pulmonary arteries: PA peak pressure: 25 mm Hg (S). - Inferior vena cava: The vessel was normal in size. The   respirophasic diameter changes were in the normal range (= 50%),   consistent with normal central venous pressure.  Impressions:  - Normal LV size with severe LV hypertrophy. EF 55-60%. Normal RV   size and systolic function. Aortic valve sclerosis without   significant stenosis.  Assessment / Plan: 1.  Coronary disease with prior stenting of the left circumflex coronary in 2009 with a bare-metal stent. Now s/p stenting of the first OM, distal LCx and mid RCA with DES in August 2018.  He has atypical chest pain probably exacerbated by heat stress.  Continue DAPT with ASA and Plavix. Given extensive amount of stenting he has had done I would favor continuing DAPT indefinitely unless he has bleeding issues.   Bystolic 10 mg daily. On statin. Maintain good hydration and limit heat exposure.  2. 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 No CHF. - well controlled on Bystolic and lisinopril.  3. Dyslipidemia. Intolerant to statins in the past. With recent stenting and CVA he is now on lipitor 40 mg daily and is tolerating this well. LDL is at goal < 70. 4. 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 S/p CVA.  6. LV function noted on Myoview with EF 37%. By Echo done  post revascularization EF  normal. Continue Bystolic and lisinopril.   I will follow up in 6 months.

## 2017-12-14 ENCOUNTER — Ambulatory Visit: Payer: Self-pay | Admitting: Cardiology

## 2018-02-22 ENCOUNTER — Encounter: Payer: Self-pay | Admitting: Internal Medicine

## 2018-02-22 ENCOUNTER — Ambulatory Visit (INDEPENDENT_AMBULATORY_CARE_PROVIDER_SITE_OTHER)
Admission: RE | Admit: 2018-02-22 | Discharge: 2018-02-22 | Disposition: A | Discharge status: Home / Self Care | Payer: Medicare Other | Source: Ambulatory Visit | Attending: Internal Medicine | Admitting: Internal Medicine

## 2018-02-22 ENCOUNTER — Ambulatory Visit: Payer: Medicare Other | Admitting: Internal Medicine

## 2018-02-22 VITALS — BP 162/80 | HR 71 | Ht 72.0 in | Wt 215.0 lb

## 2018-02-22 DIAGNOSIS — R918 Other nonspecific abnormal finding of lung field: Secondary | ICD-10-CM | POA: Diagnosis not present

## 2018-02-22 DIAGNOSIS — I10 Essential (primary) hypertension: Secondary | ICD-10-CM | POA: Diagnosis not present

## 2018-02-22 DIAGNOSIS — R05 Cough: Secondary | ICD-10-CM

## 2018-02-22 DIAGNOSIS — R911 Solitary pulmonary nodule: Secondary | ICD-10-CM | POA: Diagnosis not present

## 2018-02-22 DIAGNOSIS — R058 Other specified cough: Secondary | ICD-10-CM

## 2018-02-22 NOTE — Progress Notes (Signed)
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 Lopez Consult    Referred by Dr. Danise Lopez for eval of lung mass. Pt states had PNA recently and today his breathing is at baseline for him. He states before his PNA he painted a car and he wore a mask, but did not realize there was a hole in it. 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   onset of symptoms was abrupt one day p painting car with new acetone based paint then w/in 24h L posterolateral pleuritic cp/nasal congestion with epistaxis and hemoptysis >  eval 04/20/16 with cxr /CT? 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 Follow-up    CXR repeated today. He states "my breathing is fine". No new co's today. He is down 2 lbs since last ov 05/21/16.   ha is worse in pm's variable seems worse in pm  L cp has not recurred/ no sob or cough rec bystolic 10 mg daily until you see your primary care doctor w/in next couple of weeks Please  see patient coordinator before you leave today  to schedule PET scan - neg           12/30/2016  f/u ov/Colin Lopez re: "worse cold I've ever had" p flu/pna shots in oct 2018  Chief Complaint  Patient presents with  . Follow-up    Pt c/o cough, non prod that started after he had flu and pna vaccine 10/2016.  Cough does occ wake him up in the night.   coughing fits can be severe day > noct / paroxysmal s pattern and completely dry but feels like he's "finally getting over his cold"  Not limited by breathing from desired activities   Sleeping ok rec We will call you to schedule the CT chest in 04/2017   Late add: move up ct to prior to 02/2017 > did not do     02/22/2018  f/u ov/Colin Lopez re:  spn LLL / chronic cough on acei  Chief Complaint  Patient presents with  . Acute Visit    Pt c/o left chest soreness since had a fall on 02/17/2018.  Dyspnea:  MMRC1 = can walk nl pace, flat grade, can't hurry or go uphills or steps s sob  Cough: feels like getting choked  Sleeping: fine bed flat 4 pillows  SABA use: none  02: none    No obvious day to day or daytime variability or assoc excess/ purulent sputum or mucus plugs or hemoptysis or cp or chest tightness, subjective wheeze or overt sinus or hb symptoms.   Sleeps ok if keeps head up  without nocturnal  or early am exacerbation  of respiratory  c/o's or need for noct saba. Also denies any obvious fluctuation of symptoms with weather or environmental changes or other aggravating or alleviating factors except as outlined above   No unusual exposure hx or h/o childhood pna/ asthma or knowledge of premature birth.  Current Allergies, Complete Past Medical History, Past Surgical History, Family History, and Social History were reviewed in [employer] record.  ROS  The following are not active complaints unless bolded Hoarseness, sore throat, dysphagia, dental problems, itching, sneezing,  nasal congestion or discharge of  excess mucus or purulent secretions, ear ache,   fever, chills, sweats, unintended wt loss or wt gain, classically pleuritic or exertional cp,  orthopnea pnd or arm/hand swelling  or leg swelling, presyncope, palpitations, abdominal pain, anorexia, nausea, vomiting, diarrhea  or change in bowel habits or change in bladder habits, change in stools or change in urine, dysuria, hematuria,  rash, arthralgias, visual complaints, headache, numbness, weakness or ataxia or problems with walking or coordination,  change in mood or  memory.        Current Meds  Medication Sig  . acetaminophen (TYLENOL) 500 MG tablet Take 1,000 mg by mouth every 6 (six) hours as needed for headache (pain).  [name] aspirin EC 81 MG tablet Take 1 tablet (81 mg total) by mouth daily.  [name] atorvastatin (LIPITOR) 40 MG tablet Take 1 tablet (40 mg total) by mouth at bedtime.  [name] BYSTOLIC 10 MG tablet TAKE 1 TABLET BY MOUTH ONCE DAILY  . clopidogrel (PLAVIX) 75 MG tablet Take 1 tablet (75 mg total) by mouth daily.  . fenofibrate 160 MG tablet TAKE 1 TABLET BY MOUTH ONCE DAILY  . Krill Oil 1000 MG CAPS Take 1,000 mg by mouth at bedtime.  [name] lisinopril (PRINIVIL,ZESTRIL) 10 MG tablet Take 1 tablet (10 mg total) by mouth daily.  . nitroGLYCERIN (NITROSTAT) 0.4 MG SL tablet Place 1 tablet (0.4 mg total) under the tongue every 5 (five) minutes as needed for chest pain.  [name] OVER THE COUNTER MEDICATION Apply 1 application topically daily as needed (pain). Horse Liniment otc cream  . pantoprazole (PROTONIX) 40 MG tablet TAKE 1 TABLET BY MOUTH ONCE DAILY                     Objective:   Physical Exam  amb mod obese wm freq throat clearing/ mild voice fatigue  02/22/2018        215  12/30/2016      207  06/19/2016          209  05/21/16 211 lb (95.7 kg)  05/05/16 214 lb (97.1 kg)  04/20/16 212 lb 4 oz (96.3 kg)    Vital signs reviewed - Note on arrival 02 sats  97% on RA      HEENT: Edentulous with nl  turbinates bilaterally, and  oropharynx. Nl external ear canals without cough reflex   NECK :  without JVD/Nodes/TM/ nl carotid upstrokes bilaterally   LUNGS: no acc muscle use,  Nl contour chest which is clear to A and P bilaterally  without cough on insp or exp maneuvers   CV:  RRR  no s3 or murmur or increase in P2, and no edema   ABD:  soft and nontender with nl inspiratory excursion in the supine position. No bruits or organomegaly appreciated, bowel sounds nl  MS:  Nl gait/ ext warm without deformities, calf tenderness, cyanosis or clubbing No obvious joint restrictions   SKIN: warm and dry without lesions    NEURO:  alert, approp, nl sensorium with  no motor or cerebellar deficits apparent.       CXR PA and Lateral:   02/22/2018 :    I personally reviewed images and agree with radiology impression as follows:    1. Increasing nodularity and architectural distortion in the left mid lung, poorly demonstrated on today's chest radiograph. Follow-up noncontrast chest CT is strongly recommended in the near future for further evaluation.        Assessment:

## 2018-02-22 NOTE — Patient Instructions (Addendum)
Stop prinivil (lisinopril) and start micardis 80  mg one daily  - if too strong break it in half   Please remember to go to the  x-ray department  for your tests - we will call you with the results when they are available     Please schedule a follow up office visit in 6 weeks, call sooner if needed

## 2018-02-23 ENCOUNTER — Encounter: Payer: Self-pay | Admitting: Internal Medicine

## 2018-02-23 ENCOUNTER — Telehealth: Payer: Self-pay | Admitting: Internal Medicine

## 2018-02-23 DIAGNOSIS — R918 Other nonspecific abnormal finding of lung field: Secondary | ICD-10-CM

## 2018-02-23 MED ORDER — TELMISARTAN 80 MG PO TABS: 80.0000 mg | ORAL_TABLET | Freq: Every day | ORAL | 2 refills | Status: DC

## 2018-02-23 NOTE — Telephone Encounter (Signed)
thx for the info but this is not likely   Will be able to tell more once we see the CT

## 2018-02-23 NOTE — Telephone Encounter (Signed)
Patient is now aware of results and ct has been order nothing further is need at this time.

## 2018-02-23 NOTE — Telephone Encounter (Signed)
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 Dr Melvyn Novas please advise

## 2018-02-23 NOTE — Assessment & Plan Note (Addendum)
In the best review of chronic cough to date ( NEJM 2016 375 [phone]) ,  ACEi are now felt to cause cough in up to  20% of pts which is a 4 fold increase from previous reports and does not include the variety of non-specific complaints we see in pulmonary clinic in pts on ACEi but previously attributed to another dx like  Copd/asthma and  include PNDS, throat  congestion, "bronchitis", unexplained dyspnea and noct "strangling/choking " sensations, and hoarseness  , but also  atypical /refractory GERD symptoms like dysphagia and "bad heartburn"   The only way I know  to prove this is not an "ACEi Case" is a trial off ACEi x a minimum of 6 weeks then regroup.    We will try Micardis 80 mg 1 daily as his blood pressure is quite high on lisinopril 10 mg daily and have him self monitor with the ability to reduce the myocarditis in half if too strong.  We will let Dr. [name] know that this was done on a trial basis with follow-up here in 6 weeks regardless of the rest of the work-up to make sure that the cough/choking sensation resolves.   I had an extended discussion with the patient/wife reviewing all relevant studies completed to date and  lasting 15 to 20 minutes of a 25 minute visit    Each maintenance medication was reviewed in detail including most importantly the difference between maintenance and prns and under what circumstances the prns are to be triggered using an action plan format that is not reflected in the computer generated alphabetically organized AVS.     Please see AVS for specific instructions unique to this visit that I personally wrote and verbalized to the the pt in detail and then reviewed with pt  by my nurse highlighting any  changes in therapy recommended at today's visit to their plan of care.

## 2018-02-23 NOTE — Assessment & Plan Note (Signed)
Developed on ACEi p flu/pna shots 10/2016  - persistent sense of globus repored 02/22/2018 rec trial off acei    Upper airway cough syndrome (previously labeled PNDS),  is so named because it's frequently impossible to sort out how much is  CR/sinusitis with freq throat clearing (which can be related to primary GERD)   vs  causing  secondary (" extra esophageal")  GERD from wide swings in gastric pressure that occur with throat clearing, often  promoting self use of mint and menthol lozenges that reduce the lower esophageal sphincter tone and exacerbate the problem further in a cyclical fashion.   These are the same pts (now being labeled as having "irritable larynx syndrome" by some cough centers) who not infrequently have a history of having failed to tolerate ace inhibitors,  dry powder inhalers or biphosphonates or report having atypical/extraesophageal reflux symptoms that don't respond to standard doses of PPI  and are easily confused as having aecopd or asthma flares by even experienced allergists/ pulmonologists (myself included).   First step is trial off acei.

## 2018-02-23 NOTE — Assessment & Plan Note (Signed)
Never smoker  CTa Chest  04/21/16 1. No evidence of acute pulmonary embolus. 2. Superior segment left lower lobe rounded 2.4 cm opacity is nonspecific and corresponds to the radiographic finding one day prior - cxr 06/19/2016 more dense, localized > rec PET 06/30/2016 >>> althhough there is a persistent subpleural nodule in the left lower lobe, this demonstrates only low level hypermetabolic activity, predominately along the pleural surface. A benign etiology such as rounded atelectasis is favored, although low grade neoplasm cannot be completely excluded > rec ov with cxr in 3 months  - cxr 09/30/2016 c/w regression - only seen on PA view with rib superimposed making it look denser - 12/30/2016 no change > rec CT by 02/12/17 > did not do  - 02/22/2018 cxr increasing nodularity > rec CT s contrast   Not clear why he did not return for follow-up CT previously but advised him that despite every indication that this was a benign process we need to make sure about that and a CT chest is the very least now that needs to be done..  Discussed in detail all the  indications, usual  risks and alternatives  relative to the benefits with patient who agrees to proceed with w/u as outlined.

## 2018-02-24 ENCOUNTER — Other Ambulatory Visit: Payer: Self-pay | Admitting: Cardiology

## 2018-02-24 ENCOUNTER — Ambulatory Visit
Admission: RE | Admit: 2018-02-24 | Discharge: 2018-02-24 | Disposition: A | Discharge status: Home / Self Care | Payer: Medicare Other | Source: Ambulatory Visit | Attending: Internal Medicine | Admitting: Internal Medicine

## 2018-02-24 ENCOUNTER — Other Ambulatory Visit: Payer: Self-pay

## 2018-02-24 DIAGNOSIS — R911 Solitary pulmonary nodule: Secondary | ICD-10-CM | POA: Diagnosis not present

## 2018-02-24 DIAGNOSIS — R918 Other nonspecific abnormal finding of lung field: Secondary | ICD-10-CM | POA: Diagnosis not present

## 2018-02-24 MED ORDER — ATORVASTATIN CALCIUM 40 MG PO TABS: 40.0000 mg | ORAL_TABLET | Freq: Every day | ORAL | 1 refills | Status: DC

## 2018-02-24 MED ORDER — PANTOPRAZOLE SODIUM 40 MG PO TBEC: 40.0000 mg | DELAYED_RELEASE_TABLET | Freq: Every day | ORAL | 1 refills | Status: DC

## 2018-02-24 NOTE — Telephone Encounter (Signed)
 *  STAT* If patient is at the pharmacy, call can be transferred to refill team.   1. Which medications need to be refilled? (please list name of each medication and dose if known)  atorvastatin (LIPITOR) 40 MG tablet pantoprazole (PROTONIX) 40 MG tablet  2. Which pharmacy/location (including street and city if local pharmacy) is medication to be sent to? [facility]  3. Do they need a 30 day or 90 day supply? 90 days  Patient is at pharmacy now

## 2018-02-24 NOTE — Progress Notes (Signed)
Left detailed msg on machine ok per DPR

## 2018-02-24 NOTE — Telephone Encounter (Signed)
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 Advised patient of the response below from Dr. Melvyn Novas. Patient verbalized understanding and stated he had no other questions or concerns. Will close encounter.

## 2018-04-06 ENCOUNTER — Ambulatory Visit: Payer: Self-pay | Admitting: Internal Medicine

## 2018-04-21 ENCOUNTER — Telehealth: Payer: Self-pay

## 2018-04-21 NOTE — Telephone Encounter (Signed)
   Primary Cardiologist:  Dr.Jordan  Patient contacted.  History reviewed.  No symptoms to suggest any unstable cardiac conditions.  Based on discussion, with current pandemic situation, we will be postponing this 04/21/18 appointment for Colin Lopez with a plan for f/u 09/07/18 at 4:20 pm.  If symptoms change, he has been instructed to contact our office.     Kathyrn Lass, LPN  04/13/7060 3:76 AM         .            .             [name]

## 2018-04-22 ENCOUNTER — Ambulatory Visit: Payer: Medicare Other | Admitting: Cardiology

## 2018-05-01 ENCOUNTER — Other Ambulatory Visit: Payer: Self-pay | Admitting: Cardiology

## 2018-05-02 NOTE — Telephone Encounter (Signed)
Bystolic 10 mg and fenofibrate 160 mg refilled.

## 2018-06-23 ENCOUNTER — Other Ambulatory Visit: Payer: Self-pay

## 2018-06-23 NOTE — Patient Outreach (Signed)
[facility]) Care Management  06/23/2018  Colin Lopez 26-Mar-1940 [id number]   Medication Adherence call to Colin Lopez HIPPA Compliant Voice message left with a call back number. Colin Lopez is showing past due on Telmisartan 80 mg under [place].   [place] Management Direct Dial [phone]  Fax [phone] [email or web address]

## 2018-07-10 ENCOUNTER — Other Ambulatory Visit: Payer: Self-pay | Admitting: Internal Medicine

## 2018-07-10 ENCOUNTER — Other Ambulatory Visit: Payer: Self-pay | Admitting: Cardiology

## 2018-08-04 ENCOUNTER — Other Ambulatory Visit: Payer: Self-pay | Admitting: Cardiology

## 2018-08-05 ENCOUNTER — Other Ambulatory Visit: Payer: Self-pay | Admitting: Cardiology

## 2018-08-05 NOTE — Telephone Encounter (Signed)
Rx(s) sent to pharmacy electronically.  

## 2018-08-11 ENCOUNTER — Other Ambulatory Visit: Payer: Self-pay | Admitting: Cardiology

## 2018-09-06 NOTE — Progress Notes (Deleted)
Colin Lopez Date of Birth: 1940/12/15 Medical Record #[id number]  History of Present Illness: Mr. Kearley is seen for follow up CAD.  He has a history of coronary disease with a non-ST elevation myocardial infarction in 2009. He underwent stenting of the left circumflex coronary with a 3.5 x 16 mm Liberte stent. He does have a history of hypertension and hyperlipidemia. He is intolerant to Crestor.   He was seen in February 2018 with some atypical left shoulder and neck pain. He was tachycardic. A Myoview study was ordered but was never done. He had a CT chest in April which was negative for PE. There was a rounded density in the LLL of unclear etiology. Later PET scan in June showed low metabolic activity and low risk.  He noted severe symptoms of bad indigestion over 2 months with burning in his throat and then bad chest pressure like it is going to explode after eating. He underwent a Lexiscan myoview study which showed multiple perfusion defects and EF 37%. This led to a cardiac cath in late August 2018  that showed 3 vessel disease with successful management with DES x 3. Unable to assess LV function at cath.   He was admitted in late September 2018. 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 No intracranial branch vessel disease.  - recent 2-D echo on 09/16/16 showed EF of 55-60% with grade 1 diastolic dysfunction, no wall motion abnormalities.  - Neurology consulted, continue aspirin, Plavix.   He recently called stating he doesn't feel well and has occasional chest pain. He describes this as more indigestion. States he has been working a lot in his shed  where temperatures are over 100 degrees. 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 He is under a lot of stress.    Current Outpatient Medications on File Prior to Visit  Medication Sig Dispense Refill   acetaminophen (TYLENOL) 500 MG tablet Take 1,000 mg by mouth every 6 (six) hours as needed for headache (pain).     aspirin EC 81 MG tablet Take 1 tablet (81 mg total) by mouth daily. 90 tablet 3   atorvastatin (LIPITOR) 40 MG tablet TAKE 1 TABLET BY MOUTH AT BEDTIME 90 tablet 0   BYSTOLIC 10 MG tablet Take 1 tablet by mouth once daily 30 tablet 0   clopidogrel (PLAVIX) 75 MG tablet Take 1 tablet by mouth once daily with breakfast 90 tablet 0   fenofibrate 160 MG tablet Take 1 tablet by mouth once daily 90 tablet 0   Krill Oil 1000 MG CAPS Take 1,000 mg by mouth at bedtime.     nitroGLYCERIN (NITROSTAT) 0.4 MG SL tablet Place 1 tablet (0.4 mg total) under the tongue every 5 (five) minutes as needed for chest pain. 25 tablet 3   OVER THE COUNTER MEDICATION Apply 1 application topically daily as needed (pain). Horse Liniment otc cream     pantoprazole (PROTONIX) 40 MG tablet Take 1 tablet by mouth once daily 90 tablet 0   telmisartan (MICARDIS) 80 MG tablet Take 1 tablet by mouth once daily 90 tablet 0   No current facility-administered medications on file prior to visit.     Allergies  Allergen  Reactions   Penicillins Hives    Has patient had a PCN reaction causing immediate rash, facial/tongue/throat swelling, SOB or lightheadedness with hypotension: No Has patient had a PCN reaction causing severe rash involving mucus membranes or skin necrosis: Yes Has patient had a PCN reaction that required hospitalization: No Has patient had a PCN reaction occurring within the last 10 years: No If all of the above answers are "NO", then may proceed with Cephalosporin use.    Doxycycline Rash    Water blisters   Levaquin [Levofloxacin In D5w] Rash    Past Medical  History:  Diagnosis Date   CAD (coronary artery disease), native coronary artery    09/09/16 PCI/DES x3 to mRCA, OM1 and dLcx/OM2.    Coronary disease    Status post stenting of the left circumflex coronary in 2009 with a bare-metal stent (with a 3.5x24m Liberte stent)   Dyslipidemia    Exposure to TB    Hyperlipidemia    Hypertension    [place]   BMS CFX   Stroke ([facility]    TIA (transient ischemic attack)    history of tia    Past Surgical History:  Procedure Laterality Date   CARDIOVASCULAR STRESS TEST  10-03-08   EF 59%   CORONARY ANGIOPLASTY WITH STENT PLACEMENT  09/09/2016   CORONARY STENT INTERVENTION N/A 09/09/2016   Procedure: CORONARY STENT INTERVENTION;  Surgeon: JMartinique Laneya Gasaway M, MD;  [facility];  Service: Cardiovascular;  Laterality: N/A;   LEFT HEART CATH AND CORONARY ANGIOGRAPHY N/A 09/09/2016   Procedure: LEFT HEART CATH AND CORONARY ANGIOGRAPHY;  Surgeon: JMartinique Marieta Markov M, MD;  [facility];  Service: Cardiovascular;  Laterality: N/A;   [place]  09-21-08   EF 55-60%    Social History   Tobacco Use  Smoking Status Never Smoker  Smokeless Tobacco Never Used    Social History   Substance and Sexual Activity  Alcohol Use Yes   Comment: Rarely     Family History  Problem Relation Age of Onset   Diabetes Mother    CAD Sister 484      MI, obese   Cancer Brother        stomach    Review of Systems: As noted in history of present illness.  All other systems were reviewed and are negative.  Physical Exam: There were no vitals taken for this visit. GENERAL:  Well appearing WM in NAD HEENT:  PERRL, EOMI, sclera are clear. Oropharynx is clear. NECK:  No jugular venous distention, carotid upstroke brisk and symmetric, no bruits, no thyromegaly or adenopathy LUNGS:  Clear to auscultation bilaterally CHEST:  Unremarkable HEART:  RRR,  PMI not  displaced or sustained,S1 and S2 within normal limits, no S3, no S4: no clicks, no rubs, no murmurs ABD:  Soft, nontender. BS +, no masses or bruits. No hepatomegaly, no splenomegaly EXT:  2 + pulses throughout, no edema, no cyanosis no clubbing SKIN:  Warm and dry.  No rashes NEURO:  Alert and oriented x 3. Cranial nerves II through XII intact. PSYCH:  Cognitively intact      LABORATORY DATA:   Lab Results  Component Value Date   WBC 6.0 11/27/2016   HGB 13.0 11/27/2016   HCT 39.9 11/27/2016   PLT 166.0 11/27/2016   GLUCOSE 103 (H) 11/27/2016   CHOL 138 11/13/2016   TRIG 195.0 (H) 11/13/2016   HDL 26.70 (L) 11/13/2016  LDLDIRECT 74.0 12/17/2014   LDLCALC 73 11/13/2016   ALT 18 10/12/2016   AST 18 10/12/2016   NA 138 11/27/2016   K 4.4 11/27/2016   CL 103 11/27/2016   CREATININE 1.30 11/27/2016   BUN 28 (H) 11/27/2016   CO2 27 11/27/2016   TSH 2.786 10/12/2016   PSA 0.27 12/17/2014   INR 0.9 09/08/2016   HGBA1C 5.1 10/12/2016    Dated 05/27/17: cholesterol 151, triglycerides 129, HDL 57, LDL 69. CBC, TSH, CMET, PSA all normal.  Ecg today shows NSR normal Ecg. I have personally reviewed and interpreted this study.    Myoview 09/03/16: Study Highlights     The left ventricular ejection fraction is moderately decreased (30-44%).  Nuclear stress EF: 37%.  There was no ST segment deviation noted during stress.  This is an intermediate risk study.   1. EF 37%, diffuse hypokinesis.  2. Partially reversible medium-sized, moderate intensity basal inferior and inferoseptal and mid inferior perfusion defect. This suggests prior infarction with significant peri-infarct ischemia.   Intermediate risk study.     Cardiac cath/PCI 09/09/16: Procedures   CORONARY STENT INTERVENTION  LEFT HEART CATH AND CORONARY ANGIOGRAPHY  Conclusion     Mid LAD to Dist LAD lesion, 30 %stenosed.  Prox Cx lesion, 25 %stenosed.  Mid RCA lesion, 95 %stenosed.  A STENT  SIERRA 3.50 X 23 MM drug eluting stent was successfully placed.  Post intervention, there is a 0% residual stenosis.  Mid Cx lesion, 70 %stenosed.  A STENT SIERRA 2.75 X 12 MM drug eluting stent was successfully placed.  Post intervention, there is a 0% residual stenosis.  Ost 1st Mrg lesion, 90 %stenosed.  A STENT SIERRA 3.00 X 12 MM drug eluting stent was successfully placed.  Post intervention, there is a 0% residual stenosis.   1. Severe 2 vessel obstructive CAD    - 90% ostial OM1    - 70% distal LCx/OM2    - 95% mid RCA 2. Successful stenting of the mid RCA with DES 3. Successful stenting of the ostium of OM1 with DES 4. Successful stenting of the distal LCx/OM2  Plan: DAPT for at least one year. Anticipate DC in am.    Indications   Angina pectoris (HCC) [I20.9 (ICD-10-CM)]  Procedural Details/Technique   Technical Details Indication: 78 yo WM with remote stent of the LCx presents with angina pectoris and abnormal Myoview study  Procedural Details: The right wrist was prepped, draped, and anesthetized with 1% lidocaine. Using the modified Seldinger technique, a 6 French slender sheath was introduced into the right radial artery. 3 mg of verapamil was administered through the sheath, weight-based unfractionated heparin was administered intravenously. Standard Judkins catheters were used for selective coronary angiography. Due to tortuosity of the innominate artery I was unable to cross the AV with a pigtail catheter. Catheter exchanges were performed over an exchange length guidewire. Following diagnostic angiography we proceeded with multivessel PCI as noted below. There were no immediate procedural complications. A TR band was used for radial hemostasis at the completion of the procedure. The patient was transferred to the post catheterization recovery area for further monitoring.  Contrast: 250 cc   Estimated blood loss <50 mL.  During this procedure the patient  was administered the following to achieve and maintain moderate conscious sedation: Versed 1 mg, Fentanyl 25 mcg, while the patient's heart rate, blood pressure, and oxygen saturation were continuously monitored. The period of conscious sedation was 110 minutes, of which I was present  face-to-face 100% of this time.    Complications   Complications documented before study signed (09/09/2016 4:05 PM EDT)    No complications were associated with this study.  Documented by [name], Donatello Kleve M, MD - 09/09/2016 4:01 PM EDT    Coronary Findings   Dominance: Right  Left Anterior Descending  Mid LAD to Dist LAD lesion, 30% stenosed. The lesion is segmental.  Left Circumflex  Prox Cx lesion, 25% stenosed. The lesion was previously treated using a bare metal stent over 2 years ago.  Mid Cx lesion, 70% stenosed.  Angioplasty: Lesion crossed with guidewire using a WIRE ASAHI PROWATER 180CM. Pre-stent angioplasty was performed using a BALLOON SAPPHIRE 2.5X12. A STENT SIERRA 2.75 X 12 MM drug eluting stent was successfully placed. Stent strut is well apposed. Post-stent angioplasty was performed using a BALLOON [place] EMERGE MR 3.0X8. Maximum pressure: 16 atm. The pre-interventional distal flow is normal (TIMI 3). The post-interventional distal flow is normal (TIMI 3). The intervention was successful . No complications occurred at this lesion.  There is no residual stenosis post intervention.  First Obtuse Marginal Branch  Vessel is large in size.  Ost 1st Mrg lesion, 90% stenosed.  Angioplasty: Lesion crossed with guidewire using a BALLOON SAPPHIRE 2.5X12. A STENT SIERRA 3.00 X 12 MM drug eluting stent was successfully placed. Stent strut is well apposed. Post-stent angioplasty was performed using a BALLOON SAPPHIRE [place] 3.5X8. Maximum pressure: 16 atm. The pre-interventional distal flow is normal (TIMI 3). The post-interventional distal flow is normal (TIMI 3). The intervention was successful . No complications  occurred at this lesion. The OM 1 lesion was at the ostium. It had previously been stented across with BMS. The lesion was wired through the stent struts. The lesion was stented at the ostium ( T stent ). After it was postdilated there was some plaque shift into the LCx distal to the lesion but within the old stent. This was dilated with a 3.25 mm balloon with good result.  There is no residual stenosis post intervention.  Right Coronary Artery  Vessel is large.  Mid RCA lesion, 95% stenosed.  Angioplasty: Lesion crossed with guidewire using a WIRE ASAHI PROWATER 180CM. Pre-stent angioplasty was performed using a BALLOON SAPPHIRE 2.5X12. Maximum pressure: 8 atm. A STENT SIERRA 3.50 X 23 MM drug eluting stent was successfully placed. Stent strut is well apposed. Post-stent angioplasty was performed using a BALLOON [place] EMERGE MR 4.0X15. Maximum pressure: 16 atm. The pre-interventional distal flow is normal (TIMI 3). The post-interventional distal flow is normal (TIMI 3). The intervention was successful . No complications occurred at this lesion. Unable to engage the RCA with a LA guide. HS guide engaged well but was inadequate to support delivery of stent to lesion. Holly Bodily used for additional support with good results.  There is no residual stenosis post intervention.  Coronary Diagrams   Diagnostic Diagram       Post-Intervention Diagram          Echo 09/16/16: Study Conclusions  - Left ventricle: The cavity size was normal. Wall thickness was   increased in a pattern of severe LVH. Systolic function was   normal. The estimated ejection fraction was in the range of 55%   to 60%. Wall motion was normal; there were no regional wall   motion abnormalities. Doppler parameters are consistent with   abnormal left ventricular relaxation (grade 1 diastolic   dysfunction). - Aortic valve: Trileaflet; moderately calcified leaflets.   Sclerosis without stenosis. -  Aorta: Mildly dilated aortic  root. Aortic root dimension: 38 mm   (ED). - Mitral valve: Mildly calcified annulus. There was no significant   regurgitation. - Right ventricle: The cavity size was normal. Systolic function   was normal. - Tricuspid valve: Peak RV-RA gradient (S): 22 mm Hg. - Pulmonary arteries: PA peak pressure: 25 mm Hg (S). - Inferior vena cava: The vessel was normal in size. The   respirophasic diameter changes were in the normal range (= 50%),   consistent with normal central venous pressure.  Impressions:  - Normal LV size with severe LV hypertrophy. EF 55-60%. Normal RV   size and systolic function. Aortic valve sclerosis without   significant stenosis.  Echo 09/16/16: Study Conclusions  - Left ventricle: The cavity size was normal. Wall thickness was   increased in a pattern of severe LVH. Systolic function was   normal. The estimated ejection fraction was in the range of 55%   to 60%. Wall motion was normal; there were no regional wall   motion abnormalities. Doppler parameters are consistent with   abnormal left ventricular relaxation (grade 1 diastolic   dysfunction). - Aortic valve: Trileaflet; moderately calcified leaflets.   Sclerosis without stenosis. - Aorta: Mildly dilated aortic root. Aortic root dimension: 38 mm   (ED). - Mitral valve: Mildly calcified annulus. There was no significant   regurgitation. - Right ventricle: The cavity size was normal. Systolic function   was normal. - Tricuspid valve: Peak RV-RA gradient (S): 22 mm Hg. - Pulmonary arteries: PA peak pressure: 25 mm Hg (S). - Inferior vena cava: The vessel was normal in size. The   respirophasic diameter changes were in the normal range (= 50%),   consistent with normal central venous pressure.  Impressions:  - Normal LV size with severe LV hypertrophy. EF 55-60%. Normal RV   size and systolic function. Aortic valve sclerosis without   significant stenosis.  Assessment / Plan: 1. Coronary disease  with prior stenting of the left circumflex coronary in 2009 with a bare-metal stent. Now s/p stenting of the first OM, distal LCx and mid RCA with DES in August 2018.  He has atypical chest pain probably exacerbated by heat stress.  Continue DAPT with ASA and Plavix. Given extensive amount of stenting he has had done I would favor continuing DAPT indefinitely unless he has bleeding issues.   Bystolic 10 mg daily. On statin. Maintain good hydration and limit heat exposure.  2. 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 No CHF. - well controlled on Bystolic and lisinopril.  3. Dyslipidemia. Intolerant to statins in the past. With recent stenting and CVA he is now on lipitor 40 mg daily and is tolerating this well. LDL is at goal < 70. 4. 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 S/p CVA.  6. LV function noted on Myoview with EF 37%. By Echo done  post revascularization EF  normal. Continue Bystolic and lisinopril.   I will follow up in 6 months.

## 2018-09-07 ENCOUNTER — Ambulatory Visit: Payer: Medicare Other | Admitting: Cardiology

## 2018-10-19 ENCOUNTER — Other Ambulatory Visit: Payer: Self-pay

## 2018-10-19 NOTE — Patient Outreach (Signed)
[facility]) Care Management  10/19/2018  Colin Lopez 1940/05/17 [id number]   Medication Adherence call to Colin Lopez HIPPA Compliant Voice message left with a call back number. Colin Lopez is showing past due on Telmisartan 80 mg under [place].   [place] Management Direct Dial [phone]  Fax [phone] [email or web address]

## 2018-10-26 IMAGING — NM NM MISC PROCEDURE
9 series · 54 of 54 positions shown · non-contrast
Comparison: none

[Series 1: rest sax · 6.4mm · 6.40mm/px · 6 of 24 frames shown]
[frame 3/24]
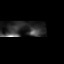
[frame 7/24]
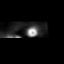
[frame 11/24]
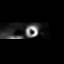
[frame 15/24]
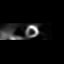
[frame 19/24]
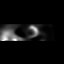
[frame 23/24]
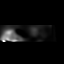

[Series 1: wbr rest · 6.40mm/px · 6 of 64 frames shown]
[frame 6/64]
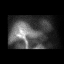
[frame 16/64]
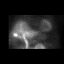
[frame 27/64]
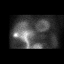
[frame 38/64]
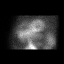
[frame 48/64]
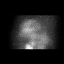
[frame 59/64]
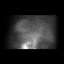

[Series 1: wbr_r-proj_st wbr rest · 6.40mm/px · 6 of 64 frames shown]
[frame 6/64]
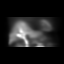
[frame 16/64]
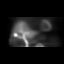
[frame 27/64]
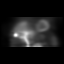
[frame 38/64]
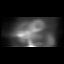
[frame 48/64]
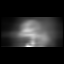
[frame 59/64]
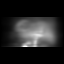

[Series 2: wbr stress-gsp · 6.40mm/px · 6 of 511 frames shown]
[frame 43/511]
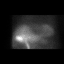
[frame 128/511]
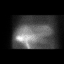
[frame 213/511]
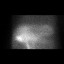
[frame 298/511]
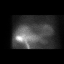
[frame 383/511]
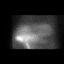
[frame 469/511]
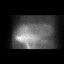

[Series 2: wbr_s-proj_st wbr stress-gsp · 6.40mm/px · 6 of 512 frames shown]
[frame 43/512]
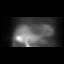
[frame 128/512]
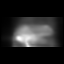
[frame 214/512]
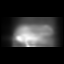
[frame 299/512]
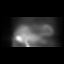
[frame 384/512]
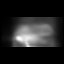
[frame 470/512]
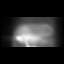

[Series 2: stress sax gs · 6.4mm · 6.40mm/px · 6 of 200 frames shown]
[frame 17/200]
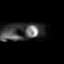
[frame 50/200]
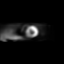
[frame 84/200]
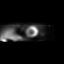
[frame 117/200]
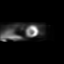
[frame 150/200]
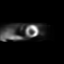
[frame 184/200]
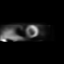

[Series 2: stress sax · 6.4mm · 6.40mm/px · 6 of 25 frames shown]
[frame 3/25]
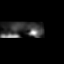
[frame 7/25]
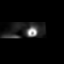
[frame 11/25]
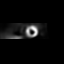
[frame 15/25]
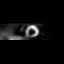
[frame 19/25]
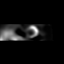
[frame 23/25]
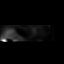

[Series 3: wbr stress-sum-em · 6.40mm/px · 6 of 64 frames shown]
[frame 6/64]
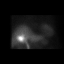
[frame 16/64]
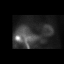
[frame 27/64]
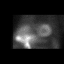
[frame 38/64]
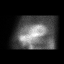
[frame 48/64]
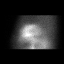
[frame 59/64]
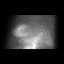

[Series 3: wbr_s-proj_st wbr stress-sum-em · 6.40mm/px · 6 of 64 frames shown]
[frame 6/64]
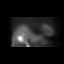
[frame 16/64]
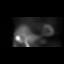
[frame 27/64]
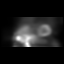
[frame 38/64]
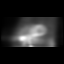
[frame 48/64]
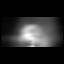
[frame 59/64]
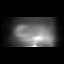

[54 of 54 positions shown; findings below may reference images not displayed]

Canned report from images found in remote index.

Refer to host system for actual result text.

## 2018-11-01 ENCOUNTER — Telehealth: Payer: Self-pay | Admitting: Cardiology

## 2018-11-01 NOTE — Telephone Encounter (Signed)
Spoke to pt wife in attempt to add pt for virtual visit with Dr. [name] tomorrow. Pt wife stated pt will want to do visit in office. Scheduled appt on 12/17 at 3:20 PM.

## 2018-11-13 ENCOUNTER — Other Ambulatory Visit: Payer: Self-pay | Admitting: Cardiology

## 2018-11-17 ENCOUNTER — Other Ambulatory Visit: Payer: Self-pay | Admitting: Cardiology

## 2018-11-17 ENCOUNTER — Other Ambulatory Visit: Payer: Self-pay

## 2018-11-18 ENCOUNTER — Other Ambulatory Visit: Payer: Self-pay

## 2018-11-18 MED ORDER — FENOFIBRATE 160 MG PO TABS: 160.0000 mg | ORAL_TABLET | Freq: Every day | ORAL | 0 refills | Status: DC

## 2018-11-18 MED ORDER — NEBIVOLOL HCL 10 MG PO TABS: 10.0000 mg | ORAL_TABLET | Freq: Every day | ORAL | 0 refills | Status: DC

## 2018-11-18 MED ORDER — PANTOPRAZOLE SODIUM 40 MG PO TBEC: DELAYED_RELEASE_TABLET | ORAL | 0 refills | Status: DC

## 2018-11-18 MED ORDER — CLOPIDOGREL BISULFATE 75 MG PO TABS: ORAL_TABLET | ORAL | 0 refills | Status: DC

## 2018-11-18 MED ORDER — ATORVASTATIN CALCIUM 40 MG PO TABS: 40.0000 mg | ORAL_TABLET | Freq: Every day | ORAL | 0 refills | Status: DC

## 2018-11-18 NOTE — Telephone Encounter (Signed)
Refill

## 2018-12-08 ENCOUNTER — Other Ambulatory Visit: Payer: Self-pay | Admitting: Cardiology

## 2018-12-26 ENCOUNTER — Telehealth: Payer: Self-pay | Admitting: Cardiology

## 2018-12-26 NOTE — Telephone Encounter (Signed)
New Message  Pt's wife called and stated that she will be accompanying her husband to his appt. No medical reason  Please call

## 2018-12-26 NOTE — Telephone Encounter (Signed)
Returned call to patient's wife left message on personal voice mail ok for her to come back at patient's appointment 12/17 with Dr.Jordan.

## 2018-12-27 NOTE — Progress Notes (Signed)
Colin Lopez Date of Birth: 11/16/1940 Medical Record #[id number]  History of Present Illness: Colin Lopez is seen for follow up CAD.  He has a history of coronary disease with a non-ST elevation myocardial infarction in 2009. He underwent stenting of the left circumflex coronary with a 3.5 x 16 mm Liberte stent. He does have a history of hypertension and hyperlipidemia. He is intolerant to Crestor.  He was seen in February 2018 with some atypical left shoulder and neck pain. He was tachycardic. A Myoview study was ordered but was never done. He had a CT chest in April which was negative for PE. There was a rounded density in the LLL of unclear etiology. Later PET scan in June showed low metabolic activity and low risk.  He noted severe symptoms of bad indigestion over 2 months with burning in his throat and then bad chest pressure like it is going to explode after eating. He underwent a Lexiscan myoview study which showed multiple perfusion defects and EF 37%. This led to a cardiac cath in late August 2018  that showed 3 vessel disease with successful management with DES x 3. Unable to assess LV function at cath.   He was admitted in late September 2018. 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 No intracranial branch vessel disease.  - recent 2-D echo on 09/16/16 showed EF of 55-60% with grade 1 diastolic dysfunction, no wall motion abnormalities.  - Neurology consulted, continue aspirin, Plavix.   Last seen a year ago and doing well. Followed by pulmonary for a pulmonary nodule and CT in Feb 2020 was unchanged.  On follow up today he is doing well. No chest  pain, dyspnea, palpitations. 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 He did misstep and developed some pain in right hip and thigh.    Current Outpatient Medications on File Prior to Visit  Medication Sig Dispense Refill  . acetaminophen (TYLENOL) 500 MG tablet Take 1,000 mg by mouth every 6 (six) hours as needed for headache (pain).    [name] aspirin EC 81 MG tablet Take 1 tablet (81 mg total) by mouth daily. 90 tablet 3  . Krill Oil 1000 MG CAPS Take 1,000 mg by mouth at bedtime.    [name] OVER THE COUNTER MEDICATION Apply 1 application topically daily as needed (pain). Horse Liniment otc cream    . pantoprazole (PROTONIX) 40 MG tablet TAKE 1 TABLET BY MOUTH EVERY DAY NEEDS APPT FOR REFILLS 60 tablet 0   No current facility-administered medications on file prior to visit.    Allergies  Allergen Reactions  . Penicillins Hives    Has patient had a PCN reaction causing immediate rash, facial/tongue/throat swelling, SOB or lightheadedness with hypotension: No Has patient had a PCN reaction causing severe rash involving mucus membranes or skin necrosis: Yes Has patient had a PCN reaction that required hospitalization: No Has patient had a PCN reaction occurring within the last 10 years: No If all of the above answers are "NO", then may proceed with Cephalosporin use.   [name] Doxycycline Rash    Water blisters  . Levaquin [Levofloxacin In D5w] Rash    Past Medical History:  Diagnosis Date  . CAD (coronary artery disease), native coronary artery    09/09/16 PCI/DES x3 to mRCA, OM1 and dLcx/OM2.   . Coronary disease    Status post stenting of the left circumflex coronary in 2009 with a bare-metal stent (with a 3.5x74m Liberte stent)  . Dyslipidemia   . 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 Hyperlipidemia   . Hypertension   . NSTEMI (non-ST elevated myocardial infarction) [place] [place]   BMS CFX  . [place]   . TIA (transient ischemic attack)    history of tia    Past  Surgical History:  Procedure Laterality Date  . CARDIOVASCULAR STRESS TEST  10-03-08   EF 59%  . CORONARY ANGIOPLASTY WITH STENT PLACEMENT  09/09/2016  . CORONARY STENT INTERVENTION N/A 09/09/2016   Procedure: CORONARY STENT INTERVENTION;  Surgeon: Colin Lyndle Pang M, MD;  [facility];  Service: Cardiovascular;  Laterality: N/A;  . LEFT HEART CATH AND CORONARY ANGIOGRAPHY N/A 09/09/2016   Procedure: LEFT HEART CATH AND CORONARY ANGIOGRAPHY;  Surgeon: Colin Orabelle Rylee M, MD;  [facility];  Service: Cardiovascular;  Laterality: N/A;  . [place]  09-21-08   EF 55-60%    Social History   Tobacco Use  Smoking Status Never Smoker  Smokeless Tobacco Never Used    Social History   Substance and Sexual Activity  Alcohol Use Yes   Comment: Rarely     Family History  Problem Relation Age of Onset  . Diabetes Mother   . CAD Sister 410      MI, obese  . Cancer Brother        stomach    Review of Systems: As noted in history of present illness.  All other systems were reviewed and are negative.  Physical Exam: BP 132/80   Pulse 61   Ht 6' (1.829 Lopez)   Wt 209 lb (94.8 kg)   SpO2 95%   BMI 28.35 kg/Lopez  GENERAL:  Well appearing WM in NAD HEENT:  PERRL, EOMI, sclera are clear. Oropharynx is clear. NECK:  No jugular venous distention, carotid upstroke brisk and symmetric, no bruits, no thyromegaly or adenopathy LUNGS:  Clear to auscultation bilaterally CHEST:  Unremarkable HEART:  RRR,  PMI not displaced or sustained,S1 and S2 within normal limits, no S3, no S4: no clicks, no rubs, no murmurs ABD:  Soft, nontender. BS +, no masses or bruits. No hepatomegaly, no splenomegaly EXT:  2 + pulses throughout, no edema, no cyanosis no clubbing SKIN:  Warm and dry.  No rashes NEURO:  Alert and oriented x 3. Cranial nerves II through XII intact. PSYCH:  Cognitively intact      LABORATORY DATA:   Lab Results  Component Value Date   WBC 6.0  11/27/2016   HGB 13.0 11/27/2016   HCT 39.9 11/27/2016   PLT 166.0 11/27/2016   GLUCOSE 103 (H) 11/27/2016   CHOL 138 11/13/2016   TRIG 195.0 (H) 11/13/2016   HDL 26.70 (L) 11/13/2016   LDLDIRECT 74.0 12/17/2014   LDLCALC 73 11/13/2016   ALT 18 10/12/2016   AST 18 10/12/2016   NA 138 11/27/2016   K 4.4 11/27/2016   CL 103 11/27/2016   CREATININE 1.30 11/27/2016   BUN 28 (H) 11/27/2016   CO2 27 11/27/2016   TSH 2.786 10/12/2016   PSA 0.27 12/17/2014   INR 0.9 09/08/2016   HGBA1C 5.1 10/12/2016    Dated 05/27/17: cholesterol 151, triglycerides 129, HDL 57, LDL  69. CBC, TSH, CMET, PSA all normal.  Ecg today shows NSR normal Ecg. Rate 61. I have personally reviewed and interpreted this study.    Myoview 09/03/16: Study Highlights     The left ventricular ejection fraction is moderately decreased (30-44%).  Nuclear stress EF: 37%.  There was no ST segment deviation noted during stress.  This is an intermediate risk study.   1. EF 37%, diffuse hypokinesis.  2. Partially reversible medium-sized, moderate intensity basal inferior and inferoseptal and mid inferior perfusion defect. This suggests prior infarction with significant peri-infarct ischemia.   Intermediate risk study.     Cardiac cath/PCI 09/09/16: Procedures   CORONARY STENT INTERVENTION  LEFT HEART CATH AND CORONARY ANGIOGRAPHY  Conclusion     Mid LAD to Dist LAD lesion, 30 %stenosed.  Prox Cx lesion, 25 %stenosed.  Mid RCA lesion, 95 %stenosed.  A STENT SIERRA 3.50 X 23 MM drug eluting stent was successfully placed.  Post intervention, there is a 0% residual stenosis.  Mid Cx lesion, 70 %stenosed.  A STENT SIERRA 2.75 X 12 MM drug eluting stent was successfully placed.  Post intervention, there is a 0% residual stenosis.  Ost 1st Mrg lesion, 90 %stenosed.  A STENT SIERRA 3.00 X 12 MM drug eluting stent was successfully placed.  Post intervention, there is a 0% residual stenosis.   1.  Severe 2 vessel obstructive CAD    - 90% ostial OM1    - 70% distal LCx/OM2    - 95% mid RCA 2. Successful stenting of the mid RCA with DES 3. Successful stenting of the ostium of OM1 with DES 4. Successful stenting of the distal LCx/OM2  Plan: DAPT for at least one year. Anticipate DC in am.    Indications   Angina pectoris (HCC) [I20.9 (ICD-10-CM)]  Procedural Details/Technique   Technical Details Indication: 78 yo WM with remote stent of the LCx presents with angina pectoris and abnormal Myoview study  Procedural Details: The right wrist was prepped, draped, and anesthetized with 1% lidocaine. Using the modified Seldinger technique, a 6 French slender sheath was introduced into the right radial artery. 3 mg of verapamil was administered through the sheath, weight-based unfractionated heparin was administered intravenously. Standard Judkins catheters were used for selective coronary angiography. Due to tortuosity of the innominate artery I was unable to cross the AV with a pigtail catheter. Catheter exchanges were performed over an exchange length guidewire. Following diagnostic angiography we proceeded with multivessel PCI as noted below. There were no immediate procedural complications. A TR band was used for radial hemostasis at the completion of the procedure. The patient was transferred to the post catheterization recovery area for further monitoring.  Contrast: 250 cc   Estimated blood loss <50 mL.  During this procedure the patient was administered the following to achieve and maintain moderate conscious sedation: Versed 1 mg, Fentanyl 25 mcg, while the patient's heart rate, blood pressure, and oxygen saturation were continuously monitored. The period of conscious sedation was 110 minutes, of which I was present face-to-face 100% of this time.    Complications   Complications documented before study signed (09/09/2016 4:05 PM EDT)    No complications were associated with  this study.  Documented by Colin, Myla Mauriello M, MD - 09/09/2016 4:01 PM EDT    Coronary Findings   Dominance: Right  Left Anterior Descending  Mid LAD to Dist LAD lesion, 30% stenosed. The lesion is segmental.  Left Circumflex  Prox Cx lesion, 25% stenosed. The lesion  was previously treated using a bare metal stent over 2 years ago.  Mid Cx lesion, 70% stenosed.  Angioplasty: Lesion crossed with guidewire using a WIRE ASAHI PROWATER 180CM. Pre-stent angioplasty was performed using a BALLOON SAPPHIRE 2.5X12. A STENT SIERRA 2.75 X 12 MM drug eluting stent was successfully placed. Stent strut is well apposed. Post-stent angioplasty was performed using a BALLOON [place] EMERGE MR 3.0X8. Maximum pressure: 16 atm. The pre-interventional distal flow is normal (TIMI 3). The post-interventional distal flow is normal (TIMI 3). The intervention was successful . No complications occurred at this lesion.  There is no residual stenosis post intervention.  First Obtuse Marginal Branch  Vessel is large in size.  Ost 1st Mrg lesion, 90% stenosed.  Angioplasty: Lesion crossed with guidewire using a BALLOON SAPPHIRE 2.5X12. A STENT SIERRA 3.00 X 12 MM drug eluting stent was successfully placed. Stent strut is well apposed. Post-stent angioplasty was performed using a BALLOON SAPPHIRE [place] 3.5X8. Maximum pressure: 16 atm. The pre-interventional distal flow is normal (TIMI 3). The post-interventional distal flow is normal (TIMI 3). The intervention was successful . No complications occurred at this lesion. The OM 1 lesion was at the ostium. It had previously been stented across with BMS. The lesion was wired through the stent struts. The lesion was stented at the ostium ( T stent ). After it was postdilated there was some plaque shift into the LCx distal to the lesion but within the old stent. This was dilated with a 3.25 mm balloon with good result.  There is no residual stenosis post intervention.  Right Coronary Artery   Vessel is large.  Mid RCA lesion, 95% stenosed.  Angioplasty: Lesion crossed with guidewire using a WIRE ASAHI PROWATER 180CM. Pre-stent angioplasty was performed using a BALLOON SAPPHIRE 2.5X12. Maximum pressure: 8 atm. A STENT SIERRA 3.50 X 23 MM drug eluting stent was successfully placed. Stent strut is well apposed. Post-stent angioplasty was performed using a BALLOON [place] EMERGE MR 4.0X15. Maximum pressure: 16 atm. The pre-interventional distal flow is normal (TIMI 3). The post-interventional distal flow is normal (TIMI 3). The intervention was successful . No complications occurred at this lesion. Unable to engage the RCA with a LA guide. HS guide engaged well but was inadequate to support delivery of stent to lesion. Holly Bodily used for additional support with good results.  There is no residual stenosis post intervention.  Coronary Diagrams   Diagnostic Diagram       Post-Intervention Diagram          Echo 09/16/16: Study Conclusions  - Left ventricle: The cavity size was normal. Wall thickness was   increased in a pattern of severe LVH. Systolic function was   normal. The estimated ejection fraction was in the range of 55%   to 60%. Wall motion was normal; there were no regional wall   motion abnormalities. Doppler parameters are consistent with   abnormal left ventricular relaxation (grade 1 diastolic   dysfunction). - Aortic valve: Trileaflet; moderately calcified leaflets.   Sclerosis without stenosis. - Aorta: Mildly dilated aortic root. Aortic root dimension: 38 mm   (ED). - Mitral valve: Mildly calcified annulus. There was no significant   regurgitation. - Right ventricle: The cavity size was normal. Systolic function   was normal. - Tricuspid valve: Peak RV-RA gradient (S): 22 mm Hg. - Pulmonary arteries: PA peak pressure: 25 mm Hg (S). - Inferior vena cava: The vessel was normal in size. The   respirophasic diameter changes were in the  normal range (= 50%),    consistent with normal central venous pressure.  Impressions:  - Normal LV size with severe LV hypertrophy. EF 55-60%. Normal RV   size and systolic function. Aortic valve sclerosis without   significant stenosis.  Echo 09/16/16: Study Conclusions  - Left ventricle: The cavity size was normal. Wall thickness was   increased in a pattern of severe LVH. Systolic function was   normal. The estimated ejection fraction was in the range of 55%   to 60%. Wall motion was normal; there were no regional wall   motion abnormalities. Doppler parameters are consistent with   abnormal left ventricular relaxation (grade 1 diastolic   dysfunction). - Aortic valve: Trileaflet; moderately calcified leaflets.   Sclerosis without stenosis. - Aorta: Mildly dilated aortic root. Aortic root dimension: 38 mm   (ED). - Mitral valve: Mildly calcified annulus. There was no significant   regurgitation. - Right ventricle: The cavity size was normal. Systolic function   was normal. - Tricuspid valve: Peak RV-RA gradient (S): 22 mm Hg. - Pulmonary arteries: PA peak pressure: 25 mm Hg (S). - Inferior vena cava: The vessel was normal in size. The   respirophasic diameter changes were in the normal range (= 50%),   consistent with normal central venous pressure.  Impressions:  - Normal LV size with severe LV hypertrophy. EF 55-60%. Normal RV   size and systolic function. Aortic valve sclerosis without   significant stenosis.  Assessment / Plan: 1. Coronary disease with prior stenting of the left circumflex coronary in 2009 with a bare-metal stent. S/p stenting of the first OM, distal LCx and mid RCA with DES in August 2018.  He is asymptomatic.  Continue DAPT with ASA and Plavix. Given extensive amount of stenting he has had done I would favor continuing DAPT indefinitely unless he has bleeding issues.    2. 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 No CHF. - well controlled on Bystolic  and lisinopril.  3. Dyslipidemia. On statin. Will update lab work today. 4. 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 S/p CVA.  6. LV function noted on Myoview with EF 37%. By Echo done  post revascularization EF  normal. Continue Bystolic and lisinopril.   I will follow up in one year.

## 2018-12-29 ENCOUNTER — Ambulatory Visit: Payer: Medicare Other | Admitting: Cardiology

## 2018-12-29 ENCOUNTER — Encounter: Payer: Self-pay | Admitting: Cardiology

## 2018-12-29 ENCOUNTER — Other Ambulatory Visit: Payer: Self-pay

## 2018-12-29 VITALS — BP 132/80 | HR 61 | Ht 72.0 in | Wt 209.0 lb

## 2018-12-29 DIAGNOSIS — E785 Hyperlipidemia, unspecified: Secondary | ICD-10-CM | POA: Diagnosis not present

## 2018-12-29 DIAGNOSIS — I2510 Atherosclerotic heart disease of native coronary artery without angina pectoris: Secondary | ICD-10-CM | POA: Diagnosis not present

## 2018-12-29 DIAGNOSIS — I10 Essential (primary) hypertension: Secondary | ICD-10-CM

## 2018-12-29 MED ORDER — ATORVASTATIN CALCIUM 40 MG PO TABS: 40.0000 mg | ORAL_TABLET | Freq: Every day | ORAL | 3 refills | Status: DC

## 2018-12-29 MED ORDER — CLOPIDOGREL BISULFATE 75 MG PO TABS: ORAL_TABLET | ORAL | 3 refills | Status: DC

## 2018-12-29 MED ORDER — NEBIVOLOL HCL 10 MG PO TABS: 10.0000 mg | ORAL_TABLET | Freq: Every day | ORAL | 3 refills | Status: DC

## 2018-12-29 MED ORDER — TELMISARTAN 80 MG PO TABS: 80.0000 mg | ORAL_TABLET | Freq: Every day | ORAL | 3 refills | Status: DC

## 2018-12-29 MED ORDER — NITROGLYCERIN 0.4 MG SL SUBL: 0.4000 mg | SUBLINGUAL_TABLET | SUBLINGUAL | 3 refills | Status: DC | PRN

## 2018-12-29 MED ORDER — FENOFIBRATE 160 MG PO TABS: 160.0000 mg | ORAL_TABLET | Freq: Every day | ORAL | 3 refills | Status: DC

## 2018-12-29 NOTE — Addendum Note (Signed)
Addended by: Kathyrn Lass on: 12/29/2018 03:58 PM   Modules accepted: Orders

## 2018-12-30 ENCOUNTER — Other Ambulatory Visit: Payer: Self-pay

## 2018-12-30 DIAGNOSIS — E785 Hyperlipidemia, unspecified: Secondary | ICD-10-CM

## 2018-12-30 DIAGNOSIS — I2510 Atherosclerotic heart disease of native coronary artery without angina pectoris: Secondary | ICD-10-CM

## 2018-12-30 LAB — HEPATIC FUNCTION PANEL
ALT: 17 IU/L (ref 0–44)
AST: 22 IU/L (ref 0–40)
Albumin: 4.2 g/dL (ref 3.7–4.7)
Alkaline Phosphatase: 56 IU/L (ref 39–117)
Bilirubin Total: 0.4 mg/dL (ref 0.0–1.2)
Bilirubin, Direct: 0.16 mg/dL (ref 0.00–0.40)
Total Protein: 6.7 g/dL (ref 6.0–8.5)

## 2018-12-30 LAB — BASIC METABOLIC PANEL
BUN/Creatinine Ratio: 22 (ref 10–24)
BUN: 32 mg/dL — ABNORMAL HIGH (ref 8–27)
CO2: 22 mmol/L (ref 20–29)
Calcium: 9.5 mg/dL (ref 8.6–10.2)
Chloride: 103 mmol/L (ref 96–106)
Creatinine, Ser: 1.47 mg/dL — ABNORMAL HIGH (ref 0.76–1.27)
GFR calc Af Amer: 52 mL/min/{1.73_m2} — ABNORMAL LOW (ref >59)
GFR calc non Af Amer: 45 mL/min/{1.73_m2} — ABNORMAL LOW (ref >59)
Glucose: 86 mg/dL (ref 65–99)
Potassium: 5.2 mmol/L (ref 3.5–5.2)
Sodium: 140 mmol/L (ref 134–144)

## 2018-12-30 LAB — LIPID PANEL
Chol/HDL Ratio: 7.1 ratio — ABNORMAL HIGH (ref 0.0–5.0)
Cholesterol, Total: 185 mg/dL (ref 100–199)
HDL: 26 mg/dL — ABNORMAL LOW (ref >39)
LDL Chol Calc (NIH): 117 mg/dL — ABNORMAL HIGH (ref 0–99)
Triglycerides: 238 mg/dL — ABNORMAL HIGH (ref 0–149)
VLDL Cholesterol Cal: 42 mg/dL — ABNORMAL HIGH (ref 5–40)

## 2018-12-30 MED ORDER — ATORVASTATIN CALCIUM 80 MG PO TABS: 80.0000 mg | ORAL_TABLET | Freq: Every day | ORAL | 3 refills | Status: DC

## 2019-01-05 ENCOUNTER — Other Ambulatory Visit: Payer: Self-pay | Admitting: Cardiology

## 2019-02-16 ENCOUNTER — Ambulatory Visit (INDEPENDENT_AMBULATORY_CARE_PROVIDER_SITE_OTHER)
Admission: RE | Admit: 2019-02-16 | Discharge: 2019-02-16 | Disposition: A | Discharge status: Home / Self Care | Payer: Medicare Other | Source: Ambulatory Visit | Attending: Internal Medicine | Admitting: Internal Medicine

## 2019-02-16 ENCOUNTER — Ambulatory Visit (INDEPENDENT_AMBULATORY_CARE_PROVIDER_SITE_OTHER): Payer: Medicare Other | Admitting: Internal Medicine

## 2019-02-16 ENCOUNTER — Telehealth: Payer: Self-pay

## 2019-02-16 ENCOUNTER — Other Ambulatory Visit: Payer: Self-pay

## 2019-02-16 ENCOUNTER — Encounter: Payer: Self-pay | Admitting: Internal Medicine

## 2019-02-16 VITALS — BP 132/70 | HR 52 | Temp 97.8°F | Wt 209.0 lb

## 2019-02-16 DIAGNOSIS — R1031 Right lower quadrant pain: Secondary | ICD-10-CM | POA: Diagnosis not present

## 2019-02-16 DIAGNOSIS — G8929 Other chronic pain: Secondary | ICD-10-CM

## 2019-02-16 DIAGNOSIS — M25561 Pain in right knee: Secondary | ICD-10-CM

## 2019-02-16 DIAGNOSIS — M25551 Pain in right hip: Secondary | ICD-10-CM | POA: Diagnosis not present

## 2019-02-16 DIAGNOSIS — M1711 Unilateral primary osteoarthritis, right knee: Secondary | ICD-10-CM | POA: Diagnosis not present

## 2019-02-16 NOTE — Telephone Encounter (Signed)
Received fax from after hours nurse line. They report pt's wife called requesting apt for leg injury.   Contacted pt who reports "leg pop" in R knee about 2.5 months ago. Bump appeared  and lasted for 4 wks but is now gone. Pain is now radiating from R knee to hip. Pt fell out of tree stand 4-5 years ago was original injury. Pt has had to have knee drained in the past. Pt reports the top of his knee is always warm and there is no change in temp. Scheduled pt for apt today with [facility]. Pt verbalized understanding.

## 2019-02-16 NOTE — Progress Notes (Signed)
Subjective:    Patient ID: Colin Lopez, male    DOB: Sep 21, 1940, 79 y.o.   MRN: [id number]  HPI  Pt presents to the clinic today with c/o right knee pain. He reports a bump behind his right knee. This started 3 months ago after feeling a pop after nearly falling when his right knee gave out. This pain is worse with movement. He reports right knee instability for 4-5 years when he fell 20 feet out of a tree stand. 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   He also reports a deep burning and stinging pain in right hip/groin which began 3 months ago and is worse with walking. He denies back pain, numbness, tingling of the right lower extremity. He has tried Tylenol with some relief.    Review of Systems      Past Medical History:  Diagnosis Date  . CAD (coronary artery disease), native coronary artery    09/09/16 PCI/DES x3 to mRCA, OM1 and dLcx/OM2.   . Coronary disease    Status post stenting of the left circumflex coronary in 2009 with a bare-metal stent (with a 3.5x61mm Liberte stent)  . Dyslipidemia   . 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 Hyperlipidemia   . Hypertension   . NSTEMI (non-ST elevated myocardial infarction) ([place]) 2009   BMS CFX  . Stroke ([place])   . TIA (transient ischemic attack)    history of tia    Current Outpatient Medications  Medication Sig Dispense Refill  . acetaminophen (TYLENOL) 500 MG tablet Take 1,000 mg by mouth every 6 (six) hours as needed for headache (pain).    [name] aspirin EC 81 MG tablet Take 1 tablet (81 mg total) by mouth daily. 90 tablet 3  . atorvastatin (LIPITOR) 80 MG tablet Take 1 tablet (80 mg total) by mouth daily. 90 tablet 3  . clopidogrel (PLAVIX) 75 MG tablet Take 1 tablet by mouth once daily with breakfast 90 tablet 3  . fenofibrate 160 MG tablet Take 1 tablet (160 mg total) by mouth daily. 90 tablet 3  . Krill Oil 1000 MG CAPS Take 1,000 mg by mouth at bedtime.    . nebivolol (BYSTOLIC) 10 MG tablet Take 1 tablet (10 mg total) by  mouth daily. 90 tablet 3  . nitroGLYCERIN (NITROSTAT) 0.4 MG SL tablet Place 1 tablet (0.4 mg total) under the tongue every 5 (five) minutes as needed for chest pain. 25 tablet 3  . OVER THE COUNTER MEDICATION Apply 1 application topically daily as needed (pain). Horse Liniment otc cream    . pantoprazole (PROTONIX) 40 MG tablet TAKE 1 TABLET BY MOUTH EVERY DAY NEEDS APPT FOR REFILLS 60 tablet 0  . telmisartan (MICARDIS) 80 MG tablet Take 1 tablet (80 mg total) by mouth daily. 90 tablet 3   No current facility-administered medications for this visit.    Allergies  Allergen Reactions  . Penicillins Hives    Has patient had a PCN reaction causing immediate rash, facial/tongue/throat swelling, SOB or lightheadedness with hypotension: No Has patient had a PCN reaction causing severe rash involving mucus membranes or skin necrosis: Yes Has patient had a PCN reaction that required hospitalization: No Has patient had a PCN reaction occurring within the last 10 years: No If all of the above answers are "NO", then may proceed with Cephalosporin use.   [name] Doxycycline Rash    Water blisters  . Levaquin [Levofloxacin In D5w] Rash    Family History  Problem Relation Age of Onset  . Diabetes Mother   . CAD Sister 59       MI, obese  . Cancer Brother        stomach    Social History   Socioeconomic History  . Marital status: Married    Spouse name: Not on file  . Number of children: 6  . Years of education: Not on file  . Highest education level: Not on file  Occupational History  . Occupation: Archivist  Tobacco Use  . Smoking status: Never Smoker  . Smokeless tobacco: Never Used  Substance and Sexual Activity  . Alcohol use: Yes    Comment: Rarely   . Drug use: No  . Sexual activity: Not Currently  Other Topics Concern  . Not on file  Social History Narrative  . Not on file   Social Determinants of Health   Financial Resource Strain:   . Difficulty of Paying Living  Expenses: Not on file  Food Insecurity:   . Worried About Charity fundraiser in the Last Year: Not on file  . Ran Out of Food in the Last Year: Not on file  Transportation Needs:   . Lack of Transportation (Medical): Not on file  . Lack of Transportation (Non-Medical): Not on file  Physical Activity:   . Days of Exercise per Week: Not on file  . Minutes of Exercise per Session: Not on file  Stress:   . Feeling of Stress : Not on file  Social Connections:   . Frequency of Communication with Friends and Family: Not on file  . Frequency of Social Gatherings with Friends and Family: Not on file  . Attends Religious Services: Not on file  . Active Member of Clubs or Organizations: Not on file  . Attends Archivist Meetings: Not on file  . Marital Status: Not on file  Intimate Partner Violence:   . Fear of Current or Ex-Partner: Not on file  . Emotionally Abused: Not on file  . Physically Abused: Not on file  . Sexually Abused: Not on file     Constitutional: Denies fever, malaise, fatigue, headache or abrupt weight changes.  Musculoskeletal: Pt reports bump behind knee which is painful with movement. He also reports pain in right hip/groin. Denies decrease in range of motion, or swelling.  Skin: Denies redness, rashes, lesions or ulcercations.    No other specific complaints in a complete review of systems (except as listed in HPI above).  Objective:   Physical Exam  BP 132/70   Pulse (!) 52   Temp 97.8 F (36.6 C) (Temporal)   Wt 209 lb (94.8 kg)   SpO2 96%   BMI 28.35 kg/m   Wt Readings from Last 3 Encounters:  12/29/18 209 lb (94.8 kg)  02/22/18 215 lb (97.5 kg)  09/22/17 210 lb (95.3 kg)    General: Appears his stated age, obese, in NAD. Skin: Warm, dry and intact. HEENT: Head: normal shape and size; Musculoskeletal: Fullness noted to posterior right knee which is tender to palpation. Right hip pain with abduction and extension. Decreased ROM of right  knee and hip due to pain with movement. No signs of joint swelling. No difficulty with gait.  Neurological: Alert and oriented.     BMET    Component Value Date/Time   NA 140 12/29/2018 1553   NA 138 11/24/2012 1910   K 5.2 12/29/2018 1553   K 3.8 11/24/2012 1910   CL 103 12/29/2018  1553   CL 106 11/24/2012 1910   CO2 22 12/29/2018 1553   CO2 28 11/24/2012 1910   GLUCOSE 86 12/29/2018 1553   GLUCOSE 103 (H) 11/27/2016 0810   GLUCOSE 119 (H) 11/24/2012 1910   BUN 32 (H) 12/29/2018 1553   BUN 20 (H) 11/24/2012 1910   CREATININE 1.47 (H) 12/29/2018 1553   CREATININE 0.95 11/24/2012 1910   CALCIUM 9.5 12/29/2018 1553   CALCIUM 8.8 11/24/2012 1910   GFRNONAA 45 (L) 12/29/2018 1553   GFRNONAA >60 11/24/2012 1910   GFRAA 52 (L) 12/29/2018 1553   GFRAA >60 11/24/2012 1910    Lipid Panel     Component Value Date/Time   CHOL 185 12/29/2018 1553   TRIG 238 (H) 12/29/2018 1553   HDL 26 (L) 12/29/2018 1553   CHOLHDL 7.1 (H) 12/29/2018 1553   CHOLHDL 5 11/13/2016 0838   VLDL 39.0 11/13/2016 0838   LDLCALC 117 (H) 12/29/2018 1553    CBC    Component Value Date/Time   WBC 6.0 11/27/2016 0810   RBC 4.32 11/27/2016 0810   HGB 13.0 11/27/2016 0810   HGB 14.3 09/08/2016 0908   HCT 39.9 11/27/2016 0810   HCT 42.3 09/08/2016 0908   PLT 166.0 11/27/2016 0810   PLT 140 (L) 09/08/2016 0908   MCV 92.5 11/27/2016 0810   MCV 89 09/08/2016 0908   MCV 87 11/24/2012 1910   MCH 29.9 10/12/2016 0342   MCHC 32.5 11/27/2016 0810   RDW 14.4 11/27/2016 0810   RDW 15.4 09/08/2016 0908   RDW 14.8 (H) 11/24/2012 1910   LYMPHSABS 1.2 11/27/2016 0810   LYMPHSABS 1.5 09/08/2016 0908   MONOABS 0.5 11/27/2016 0810   EOSABS 0.1 11/27/2016 0810   EOSABS 0.1 09/08/2016 0908   BASOSABS 0.1 11/27/2016 0810   BASOSABS 0.0 09/08/2016 0908    Hgb A1C Lab Results  Component Value Date   HGBA1C 5.1 10/12/2016            Assessment & Plan:   Right Knee Pain:  Concern for Baker's Cyst  X-ray of right knee ordered Advised to continue Tylenol PRN   Right Groin Pain:  X-ray right hip ordered 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  Safety protocols were in place, including screening questions prior to the visit, additional usage of staff PPE, and extensive cleaning of exam room while observing appropriate contact time as indicated for disinfecting solutions.

## 2019-02-16 NOTE — Patient Instructions (Signed)
Baker Cyst ° °A Baker cyst, also called a popliteal cyst, is a growth that forms at the back of the knee. The cyst forms when the fluid-filled sac (bursa) that cushions the knee joint becomes enlarged. °What are the causes? °In most cases, a Baker cyst results from another knee problem that causes swelling inside the knee. This makes the fluid inside the knee joint (synovial fluid) flow into the bursa behind the knee, causing the bursa to enlarge. °What increases the risk? °You may be more likely to develop a Baker cyst if you already have a knee problem, such as: °· A tear in cartilage that cushions the knee joint (meniscal tear). °· A tear in the tissues that connect the bones of the knee joint (ligament tear). °· Knee swelling from osteoarthritis, rheumatoid arthritis, or gout. °What are the signs or symptoms? °The main symptom of this condition is a lump behind the knee. This may be the only symptom of the condition. The lump may be painful, especially when the knee is straightened. If the lump is painful, the pain may come and go. The knee may also be stiff. °Symptoms may quickly get more severe if the cyst breaks open (ruptures). If the cyst ruptures, you may feel the following in your knee and calf: °· Sudden or worsening pain. °· Swelling. °· Bruising. °· Redness in the calf. °A Baker cyst does not always cause symptoms. °How is this diagnosed? °This condition may be diagnosed based on your symptoms and medical history. Your health care provider will also do a physical exam. This may include: °· Feeling the cyst to check whether it is tender. °· Checking your knee for signs of another knee condition that causes swelling. °You may have imaging tests, such as: °· X-rays. °· MRI. °· Ultrasound. °How is this treated? °A Baker cyst that is not painful may go away without treatment. If the cyst gets large or painful, it will likely get better if the underlying knee problem is treated. °If needed, treatment for a  Baker cyst may include: °· Resting. °· Keeping weight off of the knee. This means not leaning on the knee to support your body weight. °· Taking NSAIDs, such as ibuprofen, to reduce pain and swelling. °· Having a procedure to drain the fluid from the cyst with a needle (aspiration). You may also get an injection of a medicine that reduces swelling (steroid). °· Having surgery. This may be needed if other treatments do not work. This usually involves correcting knee damage and removing the cyst. °Follow these instructions at home: ° °Activity °· Rest as told by your health care provider. °· Avoid activities that make pain or swelling worse. °· Return to your normal activities as told by your health care provider. Ask your health care provider what activities are safe for you. °· Do not use the injured limb to support your body weight until your health care provider says that you can. Use crutches as told by your health care provider. °General instructions °· Take over-the-counter and prescription medicines only as told by your health care provider. °· Keep all follow-up visits as told by your health care provider. This is important. °Contact a health care provider if: °· You have knee pain, stiffness, or swelling that does not get better. °Get help right away if: °· You have sudden or worsening pain and swelling in your calf area. °Summary °· A Baker cyst, also called a popliteal cyst, is a growth that forms at the   back of the knee. °· In most cases, a Baker cyst results from another knee problem that causes swelling inside the knee. °· A Baker cyst that is not painful may go away without treatment. °· If needed, treatment for a Baker cyst may include resting, keeping weight off of the knee, medicines, or draining fluid from the cyst. °· Surgery may be needed if other treatments are not effective. °This information is not intended to replace advice given to you by your health care provider. Make sure you discuss any  questions you have with your health care provider. °Document Revised: 05/13/2018 Document Reviewed: 05/13/2018 °Elsevier Patient Education © 2020 Elsevier Inc. ° ° ° °

## 2019-02-16 NOTE — Telephone Encounter (Signed)
Will discuss at upcoming appt.

## 2019-02-17 ENCOUNTER — Encounter: Payer: Self-pay | Admitting: Internal Medicine

## 2019-02-20 ENCOUNTER — Telehealth: Payer: Self-pay | Admitting: Internal Medicine

## 2019-02-20 NOTE — Telephone Encounter (Signed)
Pt's wife, [name], returned call. Advised of results below. She will let pt know about referral and if he wants to proceed with it she will call back. [name] verbalized understanding.  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 Would he be agreeable for orthopedic referral and evaluation?   Jearld Fenton, NP  02/17/2019 1:37 PM EST    X-ray of the right hip is normal, you likely have chronic sartorius strain. I would recommend heat and stretching.

## 2019-02-20 NOTE — Telephone Encounter (Signed)
Patient's wife is returning call about patient's xray results  Unable to attach to the results note

## 2019-03-30 DIAGNOSIS — E785 Hyperlipidemia, unspecified: Secondary | ICD-10-CM | POA: Diagnosis not present

## 2019-03-30 DIAGNOSIS — I2510 Atherosclerotic heart disease of native coronary artery without angina pectoris: Secondary | ICD-10-CM | POA: Diagnosis not present

## 2019-03-30 LAB — HEPATIC FUNCTION PANEL
ALT: 20 IU/L (ref 0–44)
AST: 22 IU/L (ref 0–40)
Albumin: 4 g/dL (ref 3.7–4.7)
Alkaline Phosphatase: 58 IU/L (ref 39–117)
Bilirubin Total: 0.5 mg/dL (ref 0.0–1.2)
Bilirubin, Direct: 0.21 mg/dL (ref 0.00–0.40)
Total Protein: 6.2 g/dL (ref 6.0–8.5)

## 2019-03-30 LAB — LIPID PANEL
Chol/HDL Ratio: 7.4 ratio — ABNORMAL HIGH (ref 0.0–5.0)
Cholesterol, Total: 156 mg/dL (ref 100–199)
HDL: 21 mg/dL — ABNORMAL LOW (ref >39)
LDL Chol Calc (NIH): 92 mg/dL (ref 0–99)
Triglycerides: 253 mg/dL — ABNORMAL HIGH (ref 0–149)
VLDL Cholesterol Cal: 43 mg/dL — ABNORMAL HIGH (ref 5–40)

## 2019-04-13 ENCOUNTER — Telehealth: Payer: Self-pay | Admitting: Cardiology

## 2019-04-13 ENCOUNTER — Other Ambulatory Visit: Payer: Self-pay

## 2019-04-13 DIAGNOSIS — E785 Hyperlipidemia, unspecified: Secondary | ICD-10-CM

## 2019-04-13 MED ORDER — EZETIMIBE 10 MG PO TABS: 10.0000 mg | ORAL_TABLET | Freq: Every day | ORAL | 3 refills | Status: DC

## 2019-04-13 NOTE — Telephone Encounter (Signed)
Lipid panel to be done in 3 months.Lab order mailed.

## 2019-04-13 NOTE — Telephone Encounter (Signed)
   Pt's wife returning call regarding pt's lab results  Please callp

## 2019-04-13 NOTE — Telephone Encounter (Signed)
Patient's wife called w/lab results. Rx(s) sent to pharmacy electronically. Fish oil added to med list (already takes krill oil). Lab order will be mailed   The following abnormalities are noted: LFTs are normal. Cholesterol has improved with increase in lipitor from 40-80 mg. LDL from 117 to 92. Triglycerides are elevated at 253.  All other values are normal, stable or within acceptable limits. Medication changes / Follow up labs / Other changes or recommendations:  Continue current dose of lipitor. Add Zetia 10 mg daily. Add fish oil 4 grams daily. Repeat labs in 3 months.

## 2019-05-24 ENCOUNTER — Other Ambulatory Visit: Payer: Self-pay

## 2019-05-24 ENCOUNTER — Ambulatory Visit (INDEPENDENT_AMBULATORY_CARE_PROVIDER_SITE_OTHER): Payer: Medicare Other | Admitting: Internal Medicine

## 2019-05-24 ENCOUNTER — Encounter: Payer: Self-pay | Admitting: Internal Medicine

## 2019-05-24 VITALS — BP 104/54 | HR 60 | Temp 98.3°F | Wt 210.4 lb

## 2019-05-24 DIAGNOSIS — R197 Diarrhea, unspecified: Secondary | ICD-10-CM | POA: Diagnosis not present

## 2019-05-24 DIAGNOSIS — R109 Unspecified abdominal pain: Secondary | ICD-10-CM

## 2019-05-24 DIAGNOSIS — R350 Frequency of micturition: Secondary | ICD-10-CM | POA: Diagnosis not present

## 2019-05-24 DIAGNOSIS — R300 Dysuria: Secondary | ICD-10-CM | POA: Diagnosis not present

## 2019-05-24 DIAGNOSIS — Z125 Encounter for screening for malignant neoplasm of prostate: Secondary | ICD-10-CM

## 2019-05-24 DIAGNOSIS — R102 Pelvic and perineal pain: Secondary | ICD-10-CM

## 2019-05-24 LAB — POCT URINALYSIS DIPSTICK
Bilirubin, UA: NEGATIVE
Glucose, UA: NEGATIVE
Nitrite, UA: POSITIVE
Protein, UA: POSITIVE — AB
Spec Grav, UA: 1.025 (ref 1.010–1.025)
Urobilinogen, UA: NEGATIVE E.U./dL — AB
pH, UA: 5.5 (ref 5.0–8.0)

## 2019-05-24 MED ORDER — CIPROFLOXACIN HCL 500 MG PO TABS: 500.0000 mg | ORAL_TABLET | Freq: Two times a day (BID) | ORAL | 0 refills | Status: DC

## 2019-05-24 MED ORDER — ONDANSETRON HCL 4 MG PO TABS: 4.0000 mg | ORAL_TABLET | Freq: Three times a day (TID) | ORAL | 0 refills | Status: DC | PRN

## 2019-05-24 NOTE — Progress Notes (Signed)
HPI  Pt presents to the clinic today with c/o burning with urination and incontinence. This started about 2 months ago, but has worsened in the last 2 weeks. He denies urgency, frequency or blood in his urine, although he has noticed his urine has been darker. He has had poor appetite, nausea, bilateral low back pain and some diarrhea that occurred yesterday. He denies fever, chills or body aches. He has not taken anything OTC for this.   Review of Systems  Past Medical History:  Diagnosis Date  . CAD (coronary artery disease), native coronary artery    09/09/16 PCI/DES x3 to mRCA, OM1 and dLcx/OM2.   . Coronary disease    Status post stenting of the left circumflex coronary in 2009 with a bare-metal stent (with a 3.5x21mm Liberte stent)  . Dyslipidemia   . 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 Hyperlipidemia   . Hypertension   . NSTEMI (non-ST elevated myocardial infarction) ([place]) 2009   BMS CFX  . Stroke ([place])   . TIA (transient ischemic attack)    history of tia    Family History  Problem Relation Age of Onset  . Diabetes Mother   . CAD Sister 94       MI, obese  . Cancer Brother        stomach    Social History   Socioeconomic History  . Marital status: Married    Spouse name: Not on file  . Number of children: 6  . Years of education: Not on file  . Highest education level: Not on file  Occupational History  . Occupation: Archivist  Tobacco Use  . Smoking status: Never Smoker  . Smokeless tobacco: Never Used  Substance and Sexual Activity  . Alcohol use: Yes    Comment: Rarely   . Drug use: No  . Sexual activity: Not Currently  Other Topics Concern  . Not on file  Social History Narrative  . Not on file   Social Determinants of Health   Financial Resource Strain:   . Difficulty of Paying Living Expenses:   Food Insecurity:   . Worried About Charity fundraiser in the Last Year:   . Arboriculturist in the Last Year:   Transportation Needs:   . Film/video editor  (Medical):   [name] Lack of Transportation (Non-Medical):   Physical Activity:   . Days of Exercise per Week:   . Minutes of Exercise per Session:   Stress:   . Feeling of Stress :   Social Connections:   . Frequency of Communication with Friends and Family:   . Frequency of Social Gatherings with Friends and Family:   . Attends Religious Services:   . Active Member of Clubs or Organizations:   . Attends Archivist Meetings:   [name] Marital Status:   Intimate Partner Violence:   . Fear of Current or Ex-Partner:   . Emotionally Abused:   [name] Physically Abused:   . Sexually Abused:     Allergies  Allergen Reactions  . Penicillins Hives    Has patient had a PCN reaction causing immediate rash, facial/tongue/throat swelling, SOB or lightheadedness with hypotension: No Has patient had a PCN reaction causing severe rash involving mucus membranes or skin necrosis: Yes Has patient had a PCN reaction that required hospitalization: No Has patient had a PCN reaction occurring within the last 10 years: No If all of the above answers are "NO", then may proceed with Cephalosporin use.   [name]  Doxycycline Rash    Water blisters  . Levaquin [Levofloxacin In D5w] Rash     Constitutional: Denies fever, malaise, fatigue, headache or abrupt weight changes.   GU: Pt reports burning with urination, dark urine, incontinence and bilateral low back pain. Denies urgency, frequency, dysuria, blood in urine, odor or discharge. Skin: Denies redness, rashes, lesions or ulcercations.   No other specific complaints in a complete review of systems (except as listed in HPI above).    Objective:   Physical Exam  BP (!) 104/54 (BP Location: Left Arm, Patient Position: Sitting, Cuff Size: Normal)   Pulse 60   Temp 98.3 F (36.8 C) (Oral)   Wt 210 lb 6.4 oz (95.4 kg)   SpO2 94%   BMI 28.54 kg/m   Wt Readings from Last 3 Encounters:  02/16/19 209 lb (94.8 kg)  12/29/18 209 lb (94.8 kg)  02/22/18 215 lb  (97.5 kg)    General: Appears his stated age, obese, pale, in NAD. Cardiovascular: Normal rate and rhythm. S1,S2 noted.   Pulmonary/Chest: Normal effort and positive vesicular breath sounds. No respiratory distress. No wheezes, rales or ronchi noted.  Abdomen: Soft and nontender. No CVA tenderness. Rectal: Normal rectal tone. Prostate enlarged, slightly hard with palpation. Hemoccult negative.        Assessment & Plan:   Burning with Urination, Bilateral Flank Pain, Perineal, Diarrhea:  CBC, CMET, PSA today Urinalysis: 3+ leuks, 2+ blood, positive nitrites Will send urine culture eRx sent if for Cipro 500 mg BID x 30 days OK to take AZO OTC Drink plenty of fluids eRx for Zofran 4 mg TID prn for nausea  RTC as needed or if symptoms persist. Webb Silversmith, NP This visit occurred during the SARS-CoV-2 public health emergency.  Safety protocols were in place, including screening questions prior to the visit, additional usage of staff PPE, and extensive cleaning of exam room while observing appropriate contact time as indicated for disinfecting solutions.

## 2019-05-25 ENCOUNTER — Inpatient Hospital Stay (HOSPITAL_COMMUNITY)
Admission: EM | Admit: 2019-05-25 | Discharge: 2019-05-28 | DRG: 690 | Disposition: A | Discharge status: Home / Self Care | Payer: Medicare Other | Attending: Internal Medicine | Admitting: Internal Medicine

## 2019-05-25 ENCOUNTER — Encounter (HOSPITAL_COMMUNITY): Payer: Self-pay | Admitting: Pediatrics

## 2019-05-25 ENCOUNTER — Emergency Department (HOSPITAL_COMMUNITY): Payer: Medicare Other

## 2019-05-25 ENCOUNTER — Encounter: Payer: Self-pay | Admitting: Internal Medicine

## 2019-05-25 ENCOUNTER — Other Ambulatory Visit: Payer: Self-pay

## 2019-05-25 ENCOUNTER — Telehealth: Payer: Self-pay | Admitting: Radiology

## 2019-05-25 DIAGNOSIS — R972 Elevated prostate specific antigen [PSA]: Secondary | ICD-10-CM | POA: Diagnosis present

## 2019-05-25 DIAGNOSIS — Z20822 Contact with and (suspected) exposure to covid-19: Secondary | ICD-10-CM | POA: Diagnosis present

## 2019-05-25 DIAGNOSIS — N10 Acute tubulo-interstitial nephritis: Secondary | ICD-10-CM | POA: Diagnosis not present

## 2019-05-25 DIAGNOSIS — Z881 Allergy status to other antibiotic agents status: Secondary | ICD-10-CM | POA: Diagnosis not present

## 2019-05-25 DIAGNOSIS — I10 Essential (primary) hypertension: Secondary | ICD-10-CM | POA: Diagnosis not present

## 2019-05-25 DIAGNOSIS — Z8673 Personal history of transient ischemic attack (TIA), and cerebral infarction without residual deficits: Secondary | ICD-10-CM

## 2019-05-25 DIAGNOSIS — I129 Hypertensive chronic kidney disease with stage 1 through stage 4 chronic kidney disease, or unspecified chronic kidney disease: Secondary | ICD-10-CM | POA: Diagnosis not present

## 2019-05-25 DIAGNOSIS — Z8249 Family history of ischemic heart disease and other diseases of the circulatory system: Secondary | ICD-10-CM

## 2019-05-25 DIAGNOSIS — E785 Hyperlipidemia, unspecified: Secondary | ICD-10-CM | POA: Diagnosis not present

## 2019-05-25 DIAGNOSIS — N390 Urinary tract infection, site not specified: Secondary | ICD-10-CM | POA: Diagnosis present

## 2019-05-25 DIAGNOSIS — Z888 Allergy status to other drugs, medicaments and biological substances status: Secondary | ICD-10-CM | POA: Diagnosis not present

## 2019-05-25 DIAGNOSIS — R0602 Shortness of breath: Secondary | ICD-10-CM | POA: Diagnosis not present

## 2019-05-25 DIAGNOSIS — Z79899 Other long term (current) drug therapy: Secondary | ICD-10-CM | POA: Diagnosis not present

## 2019-05-25 DIAGNOSIS — R32 Unspecified urinary incontinence: Secondary | ICD-10-CM | POA: Diagnosis present

## 2019-05-25 DIAGNOSIS — N1832 Chronic kidney disease, stage 3b: Secondary | ICD-10-CM | POA: Diagnosis not present

## 2019-05-25 DIAGNOSIS — I252 Old myocardial infarction: Secondary | ICD-10-CM

## 2019-05-25 DIAGNOSIS — I2510 Atherosclerotic heart disease of native coronary artery without angina pectoris: Secondary | ICD-10-CM | POA: Diagnosis present

## 2019-05-25 DIAGNOSIS — N281 Cyst of kidney, acquired: Secondary | ICD-10-CM | POA: Diagnosis not present

## 2019-05-25 DIAGNOSIS — Z880 Allergy status to penicillin: Secondary | ICD-10-CM

## 2019-05-25 DIAGNOSIS — N179 Acute kidney failure, unspecified: Secondary | ICD-10-CM | POA: Diagnosis not present

## 2019-05-25 DIAGNOSIS — Z955 Presence of coronary angioplasty implant and graft: Secondary | ICD-10-CM | POA: Diagnosis not present

## 2019-05-25 DIAGNOSIS — K219 Gastro-esophageal reflux disease without esophagitis: Secondary | ICD-10-CM | POA: Diagnosis present

## 2019-05-25 DIAGNOSIS — B9620 Unspecified Escherichia coli [E. coli] as the cause of diseases classified elsewhere: Secondary | ICD-10-CM | POA: Diagnosis not present

## 2019-05-25 DIAGNOSIS — Z7982 Long term (current) use of aspirin: Secondary | ICD-10-CM

## 2019-05-25 DIAGNOSIS — Z7902 Long term (current) use of antithrombotics/antiplatelets: Secondary | ICD-10-CM

## 2019-05-25 DIAGNOSIS — K4090 Unilateral inguinal hernia, without obstruction or gangrene, not specified as recurrent: Secondary | ICD-10-CM | POA: Diagnosis not present

## 2019-05-25 LAB — URINALYSIS, ROUTINE W REFLEX MICROSCOPIC
Bilirubin Urine: NEGATIVE
Glucose, UA: NEGATIVE mg/dL
Ketones, ur: NEGATIVE mg/dL
Nitrite: NEGATIVE
Protein, ur: NEGATIVE mg/dL
Specific Gravity, Urine: 1.01 (ref 1.005–1.030)
WBC, UA: >50 WBC/hpf — ABNORMAL HIGH (ref 0–5)
pH: 5 (ref 5.0–8.0)

## 2019-05-25 LAB — CBC WITH DIFFERENTIAL/PLATELET
Abs Immature Granulocytes: 0.04 10*3/uL (ref 0.00–0.07)
Basophils Absolute: 0 10*3/uL (ref 0.0–0.1)
Basophils Relative: 0 %
Eosinophils Absolute: 0 10*3/uL (ref 0.0–0.5)
Eosinophils Relative: 0 %
HCT: 34.1 % — ABNORMAL LOW (ref 39.0–52.0)
Hemoglobin: 10.8 g/dL — ABNORMAL LOW (ref 13.0–17.0)
Immature Granulocytes: 0 %
Lymphocytes Relative: 8 %
Lymphs Abs: 0.9 10*3/uL (ref 0.7–4.0)
MCH: 30.2 pg (ref 26.0–34.0)
MCHC: 31.7 g/dL (ref 30.0–36.0)
MCV: 95.3 fL (ref 80.0–100.0)
Monocytes Absolute: 0.9 10*3/uL (ref 0.1–1.0)
Monocytes Relative: 8 %
Neutro Abs: 9 10*3/uL — ABNORMAL HIGH (ref 1.7–7.7)
Neutrophils Relative %: 84 %
Platelets: 121 10*3/uL — ABNORMAL LOW (ref 150–400)
RBC: 3.58 MIL/uL — ABNORMAL LOW (ref 4.22–5.81)
RDW: 14.3 % (ref 11.5–15.5)
WBC: 10.9 10*3/uL — ABNORMAL HIGH (ref 4.0–10.5)
nRBC: 0 % (ref 0.0–0.2)

## 2019-05-25 LAB — COMPREHENSIVE METABOLIC PANEL
ALT: 16 U/L (ref 0–53)
ALT: 24 U/L (ref 0–44)
AST: 21 U/L (ref 0–37)
AST: 30 U/L (ref 15–41)
Albumin: 3.7 g/dL (ref 3.5–5.2)
Albumin: 3.1 g/dL — ABNORMAL LOW (ref 3.5–5.0)
Alkaline Phosphatase: 48 U/L (ref 39–117)
Alkaline Phosphatase: 47 U/L (ref 38–126)
Anion gap: 10 (ref 5–15)
BUN: 39 mg/dL — ABNORMAL HIGH (ref 6–23)
BUN: 45 mg/dL — ABNORMAL HIGH (ref 8–23)
CO2: 23 mEq/L (ref 19–32)
CO2: 19 mmol/L — ABNORMAL LOW (ref 22–32)
Calcium: 8.4 mg/dL (ref 8.4–10.5)
Calcium: 8.5 mg/dL — ABNORMAL LOW (ref 8.9–10.3)
Chloride: 101 mEq/L (ref 96–112)
Chloride: 107 mmol/L (ref 98–111)
Creatinine, Ser: 2.38 mg/dL — ABNORMAL HIGH (ref 0.40–1.50)
Creatinine, Ser: 2.5 mg/dL — ABNORMAL HIGH (ref 0.61–1.24)
GFR calc Af Amer: 27 mL/min — ABNORMAL LOW (ref >60)
GFR calc non Af Amer: 24 mL/min — ABNORMAL LOW (ref >60)
GFR: 26.47 mL/min — ABNORMAL LOW (ref >60.00)
Glucose, Bld: 107 mg/dL — ABNORMAL HIGH (ref 70–99)
Glucose, Bld: 140 mg/dL — ABNORMAL HIGH (ref 70–99)
Potassium: 4 mEq/L (ref 3.5–5.1)
Potassium: 4.1 mmol/L (ref 3.5–5.1)
Sodium: 133 mEq/L — ABNORMAL LOW (ref 135–145)
Sodium: 136 mmol/L (ref 135–145)
Total Bilirubin: 1.1 mg/dL (ref 0.2–1.2)
Total Bilirubin: 1.1 mg/dL (ref 0.3–1.2)
Total Protein: 6 g/dL (ref 6.0–8.3)
Total Protein: 6.2 g/dL — ABNORMAL LOW (ref 6.5–8.1)

## 2019-05-25 LAB — CBC
HCT: 35.4 % — ABNORMAL LOW (ref 39.0–52.0)
Hemoglobin: 11.6 g/dL — ABNORMAL LOW (ref 13.0–17.0)
MCHC: 32.7 g/dL (ref 30.0–36.0)
MCV: 93 fl (ref 78.0–100.0)
Platelets: 129 10*3/uL — ABNORMAL LOW (ref 150.0–400.0)
RBC: 3.8 Mil/uL — ABNORMAL LOW (ref 4.22–5.81)
RDW: 15.1 % (ref 11.5–15.5)
WBC: 21.2 10*3/uL (ref 4.0–10.5)

## 2019-05-25 LAB — PSA, MEDICARE: PSA: 10.52 ng/ml — ABNORMAL HIGH (ref 0.10–4.00)

## 2019-05-25 LAB — SARS CORONAVIRUS 2 BY RT PCR (HOSPITAL ORDER, PERFORMED IN ~~LOC~~ HOSPITAL LAB): SARS Coronavirus 2: NEGATIVE

## 2019-05-25 MED ORDER — PANTOPRAZOLE SODIUM 40 MG PO TBEC
40.0000 mg | DELAYED_RELEASE_TABLET | Freq: Every day | ORAL | Status: DC
  Administered 2019-05-26 – 2019-05-28 (×3): 40 mg via ORAL
  Filled 2019-05-25 (×3): qty 1

## 2019-05-25 MED ORDER — FENOFIBRATE 160 MG PO TABS
160.0000 mg | ORAL_TABLET | Freq: Every day | ORAL | Status: DC
  Administered 2019-05-26 – 2019-05-28 (×3): 160 mg via ORAL
  Filled 2019-05-25 (×3): qty 1

## 2019-05-25 MED ORDER — ACETAMINOPHEN 325 MG PO TABS: 650.0000 mg | ORAL_TABLET | Freq: Four times a day (QID) | ORAL | Status: DC | PRN

## 2019-05-25 MED ORDER — ACETAMINOPHEN 650 MG RE SUPP: 650.0000 mg | Freq: Four times a day (QID) | RECTAL | Status: DC | PRN

## 2019-05-25 MED ORDER — ENOXAPARIN SODIUM 40 MG/0.4ML ~~LOC~~ SOLN
40.0000 mg | SUBCUTANEOUS | Status: DC
  Administered 2019-05-25 – 2019-05-27 (×3): 40 mg via SUBCUTANEOUS
  Filled 2019-05-25 (×3): qty 0.4

## 2019-05-25 MED ORDER — ATORVASTATIN CALCIUM 80 MG PO TABS
80.0000 mg | ORAL_TABLET | Freq: Every day | ORAL | Status: DC
  Administered 2019-05-26 – 2019-05-28 (×3): 80 mg via ORAL
  Filled 2019-05-25 (×3): qty 1

## 2019-05-25 MED ORDER — SODIUM CHLORIDE 0.9 % IV SOLN
1.0000 g | Freq: Once | INTRAVENOUS | Status: AC
  Administered 2019-05-25: 1 g via INTRAVENOUS
  Filled 2019-05-25: qty 10

## 2019-05-25 MED ORDER — NEBIVOLOL HCL 10 MG PO TABS
10.0000 mg | ORAL_TABLET | Freq: Every day | ORAL | Status: DC
  Administered 2019-05-26 – 2019-05-28 (×3): 10 mg via ORAL
  Filled 2019-05-25 (×3): qty 1

## 2019-05-25 MED ORDER — ASPIRIN EC 81 MG PO TBEC
81.0000 mg | DELAYED_RELEASE_TABLET | Freq: Every day | ORAL | Status: DC
  Administered 2019-05-26 – 2019-05-28 (×3): 81 mg via ORAL
  Filled 2019-05-25 (×3): qty 1

## 2019-05-25 MED ORDER — ONDANSETRON HCL 4 MG/2ML IJ SOLN: 4.0000 mg | Freq: Four times a day (QID) | INTRAMUSCULAR | Status: DC | PRN

## 2019-05-25 MED ORDER — KRILL OIL 1000 MG PO CAPS: 1000.0000 mg | ORAL_CAPSULE | Freq: Every day | ORAL | Status: DC

## 2019-05-25 MED ORDER — EZETIMIBE 10 MG PO TABS
10.0000 mg | ORAL_TABLET | Freq: Every day | ORAL | Status: DC
  Administered 2019-05-26 – 2019-05-28 (×3): 10 mg via ORAL
  Filled 2019-05-25 (×3): qty 1

## 2019-05-25 MED ORDER — SODIUM CHLORIDE 0.9 % IV SOLN: INTRAVENOUS | Status: DC

## 2019-05-25 MED ORDER — CLOPIDOGREL BISULFATE 75 MG PO TABS
75.0000 mg | ORAL_TABLET | Freq: Every day | ORAL | Status: DC
  Administered 2019-05-26 – 2019-05-28 (×3): 75 mg via ORAL
  Filled 2019-05-25 (×3): qty 1

## 2019-05-25 MED ORDER — SODIUM CHLORIDE 0.9 % IV BOLUS (SEPSIS)
500.0000 mL | Freq: Once | INTRAVENOUS | Status: AC
  Administered 2019-05-25: 500 mL via INTRAVENOUS

## 2019-05-25 MED ORDER — ONDANSETRON HCL 4 MG PO TABS: 4.0000 mg | ORAL_TABLET | Freq: Four times a day (QID) | ORAL | Status: DC | PRN

## 2019-05-25 NOTE — Patient Instructions (Signed)
Prostatitis  Prostatitis is swelling of the prostate gland. The prostate helps to make semen. It is below a man's bladder, in front of the rectum. There are different types of prostatitis. Follow these instructions at home:   Take over-the-counter and prescription medicines only as told by your doctor.  If you were prescribed an antibiotic medicine, take it as told by your doctor. Do not stop taking the antibiotic even if you start to feel better.  If your doctor prescribed exercises, do them as directed.  Take sitz baths as told by your doctor. To take a sitz bath, sit in warm water that is deep enough to cover your hips and butt.  Keep all follow-up visits as told by your doctor. This is important. Contact a doctor if:  Your symptoms get worse.  You have a fever. Get help right away if:  You have chills.  You feel sick to your stomach (nauseous).  You throw up (vomit).  You feel light-headed.  You feel like you might pass out (faint).  You cannot pee (urinate).  You have blood or clumps of blood (blood clots) in your pee (urine). This information is not intended to replace advice given to you by your health care provider. Make sure you discuss any questions you have with your health care provider. Document Revised: 12/11/2016 Document Reviewed: 09/19/2015 Elsevier Patient Education  2020 Elsevier Inc.  

## 2019-05-25 NOTE — Telephone Encounter (Signed)
[facility] lab called a critical WBC - 21.2. Results given to Webb Silversmith, NP

## 2019-05-25 NOTE — ED Provider Notes (Signed)
[facility] EMERGENCY DEPARTMENT Provider Note   CSN: [id number] Arrival date & time: 05/25/19  1603     History Chief Complaint  Patient presents with  . Abnormal Lab    Colin Lopez is a 79 y.o. male.  Colin Lopez is a 79 year old male with a past medical history of CAD status post PCI, hypertension, hyperlipidemia, prior CVA presenting with 6 weeks of urinary changes found to have an elevated white count and acute kidney failure at his PCPs office.  Patient reports he was in his usual state of health until around 6 weeks ago when he developed dysuria.  He also reports increased frequency that as many as 15 nighttime awakenings.  He also endorses a weak stream and decreased urinary output.  He denies urgency but does endorse incontinence, especially when he bends over.  He has also developed bilateral lower back and hip pain.  His pain occurs whenever he is moving around and working such that he has had to stop working.  He denies any prior history of prostate or urinary problems.  He denies fevers, chills.  He does endorse rhinorrhea secondary to allergies.  He denies chest pain.  Wife states he has had some shortness of breath for the last few days and fatigue.  He denies abdominal pain, falls, rashes and syncope.        Past Medical History:  Diagnosis Date  . CAD (coronary artery disease), native coronary artery    09/09/16 PCI/DES x3 to mRCA, OM1 and dLcx/OM2.   . Coronary disease    Status post stenting of the left circumflex coronary in 2009 with a bare-metal stent (with a 3.5x13mm Liberte stent)  . Dyslipidemia   . 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 Hyperlipidemia   . Hypertension   . NSTEMI (non-ST elevated myocardial infarction) ([place]) 2009   BMS CFX  . Stroke ([place])   . TIA (transient ischemic attack)    history of tia  no prior prostate or urinary problems   Patient Active Problem List   Diagnosis Date Noted  . Upper airway cough syndrome 12/31/2016  . Stroke  (cerebrum) ([place]) 10/12/2016  . Angina pectoris ([place]) 09/09/2016  . 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 Insomnia 12/17/2014  . Neuropathic pain of right foot 12/17/2014  . Coronary disease   . Dyslipidemia   . Essential hypertension     Past Surgical History:  Procedure Laterality Date  . CARDIOVASCULAR STRESS TEST  10-03-08   EF 59%  . CORONARY ANGIOPLASTY WITH STENT PLACEMENT  09/09/2016  . CORONARY STENT INTERVENTION N/A 09/09/2016   Procedure: CORONARY STENT INTERVENTION;  Surgeon: [name], [name] M, MD;  Location: [facility];  Service: Cardiovascular;  Laterality: N/A;  . LEFT HEART CATH AND CORONARY ANGIOGRAPHY N/A 09/09/2016   Procedure: LEFT HEART CATH AND CORONARY ANGIOGRAPHY;  Surgeon: [name], [name] M, MD;  Location: [facility];  Service: Cardiovascular;  Laterality: N/A;  . US ECHOCARDIOGRAPHY  09-21-08   EF 55-60%  no prior surgeries     Family History  Problem Relation Age of Onset  . Diabetes Mother   . CAD Sister 66       MI, obese  . Cancer Brother        stomach  no family hx of kidney problems  Social History   Tobacco Use  . Smoking status: Never Smoker  . Smokeless tobacco: Never Used  Substance Use  Topics  . Alcohol use: Yes    Comment: Rarely   . Drug use: No  Lives at home with wife  Home Medications Prior to Admission medications   Medication Sig Start Date End Date Taking? Authorizing Provider  acetaminophen (TYLENOL) 500 MG tablet Take 1,000 mg by mouth every 6 (six) hours as needed for headache (pain).    [provider]  aspirin EC 81 MG tablet Take 1 tablet (81 mg total) by mouth daily. 11/06/13   [name], [name] M, MD  atorvastatin (LIPITOR) 80 MG tablet Take 1 tablet (80 mg total) by mouth daily. 12/30/18 05/24/19  [name], [name] M, MD  ciprofloxacin (CIPRO) 500 MG tablet Take 1 tablet (500 mg total) by mouth 2 (two) times daily. 05/24/19   Jearld Fenton, NP  clopidogrel  (PLAVIX) 75 MG tablet Take 1 tablet by mouth once daily with breakfast 12/29/18   [name], [name] M, MD  ezetimibe (ZETIA) 10 MG tablet Take 1 tablet (10 mg total) by mouth daily. 04/13/19 07/12/19  [name], [name] M, MD  fenofibrate 160 MG tablet Take 1 tablet (160 mg total) by mouth daily. 12/29/18   [name], [name] M, MD  Krill Oil 1000 MG CAPS Take 1,000 mg by mouth at bedtime.    [provider]  nebivolol (BYSTOLIC) 10 MG tablet Take 1 tablet (10 mg total) by mouth daily. 12/29/18   [name], [name] M, MD  nitroGLYCERIN (NITROSTAT) 0.4 MG SL tablet Place 1 tablet (0.4 mg total) under the tongue every 5 (five) minutes as needed for chest pain. 12/29/18   [name], [name] M, MD  Omega-3 1000 MG CAPS Take 4,000 mg by mouth daily.    [provider]  ondansetron (ZOFRAN) 4 MG tablet Take 1 tablet (4 mg total) by mouth every 8 (eight) hours as needed. 05/24/19   Jearld Fenton, NP  OVER THE COUNTER MEDICATION Apply 1 application topically daily as needed (pain). Horse Liniment otc cream    [provider]  pantoprazole (PROTONIX) 40 MG tablet TAKE 1 TABLET BY MOUTH EVERY DAY NEEDS APPT FOR REFILLS 11/18/18   [name], [name] M, MD  telmisartan (MICARDIS) 80 MG tablet Take 1 tablet (80 mg total) by mouth daily. 12/29/18   [name], [name] M, MD    Allergies    Penicillins, Doxycycline, and Levaquin [levofloxacin in d5w]  Review of Systems   Review of Systems  Constitutional: Positive for activity change and fatigue. Negative for chills and fever.  Respiratory: Positive for shortness of breath.   Cardiovascular: Negative for chest pain.  Gastrointestinal: Negative for abdominal pain.  Genitourinary: Positive for difficulty urinating, dysuria, flank pain and frequency.  Musculoskeletal:       No falls; bilateral hip pain  Skin: Negative for rash.  Neurological: Negative for syncope.    Physical Exam Updated Vital Signs BP (!) 123/55   Pulse 74   Temp 97.7 F (36.5 C) (Oral)    Resp 16   Ht 6' (1.829 m)   Wt 95.3 kg   SpO2 95%   BMI 28.48 kg/m   Physical Exam Constitutional:      General: He is not in acute distress. HENT:     Head: Normocephalic and atraumatic.  Cardiovascular:     Rate and Rhythm: Normal rate and regular rhythm.     Heart sounds: No murmur.  Pulmonary:     Effort: Pulmonary effort is normal. No respiratory distress.     Breath sounds: Normal breath sounds. No rales.  Abdominal:     General: Abdomen is flat. Bowel sounds are normal. There is no distension.     Palpations: Abdomen is soft.     Tenderness: There is no abdominal tenderness. There is no left CVA tenderness.  Musculoskeletal:        General: No swelling, tenderness or deformity. Normal range of motion.  Skin:    General: Skin is warm and dry.  Neurological:     General: No focal deficit present.     Mental Status: He is alert.  Psychiatric:        Mood and Affect: Mood normal.    ED Results / Procedures / Treatments   Labs (all labs ordered are listed, but only abnormal results are displayed) Labs Reviewed  CBC WITH DIFFERENTIAL/PLATELET - Abnormal; Notable for the following components:      Result Value   WBC 10.9 (*)    RBC 3.58 (*)    Hemoglobin 10.8 (*)    HCT 34.1 (*)    Platelets 121 (*)    Neutro Abs 9.0 (*)    All other components within normal limits  COMPREHENSIVE METABOLIC PANEL - Abnormal; Notable for the following components:   CO2 19 (*)    Glucose, Bld 140 (*)    BUN 45 (*)    Creatinine, Ser 2.50 (*)    Calcium 8.5 (*)    Total Protein 6.2 (*)    Albumin 3.1 (*)    GFR calc non Af Amer 24 (*)    GFR calc Af Amer 27 (*)    All other components within normal limits  URINALYSIS, ROUTINE W REFLEX MICROSCOPIC - Abnormal; Notable for the following components:   APPearance CLOUDY (*)    Hgb urine dipstick SMALL (*)    Leukocytes,Ua LARGE (*)    WBC, UA >50 (*)    Bacteria, UA MANY (*)    All other components within normal limits  URINE  CULTURE  SARS CORONAVIRUS 2 BY RT PCR (HOSPITAL ORDER, [facility])    EKG None  Radiology DG Chest Portable 1 View  Result Date: 05/25/2019 CLINICAL DATA:  Shortness of breath EXAM: PORTABLE CHEST 1 VIEW COMPARISON:  02/22/2018 FINDINGS: Cardiomegaly. No confluent airspace opacity, effusion or edema. No acute bony abnormality. IMPRESSION: Cardiomegaly.  No active disease. Electronically Signed   By: Rolm Baptise M.D.   On: 05/25/2019 18:31   CT Renal Stone Study  Result Date: 05/25/2019 CLINICAL DATA:  Flank pain, elevated white blood cell count EXAM: CT ABDOMEN AND PELVIS WITHOUT CONTRAST TECHNIQUE: Multidetector CT imaging of the abdomen and pelvis was performed following the standard protocol without IV contrast. COMPARISON:  11/24/2012 FINDINGS: Lower chest: No acute pleural or parenchymal lung disease. Hepatobiliary: No focal liver abnormality is seen. No gallstones, gallbladder wall thickening, or biliary dilatation. Pancreas: Unremarkable. No pancreatic ductal dilatation or surrounding inflammatory changes. Spleen: Normal in size without focal abnormality. Adrenals/Urinary Tract: No urinary tract calculi or obstructive uropathy within either kidney. Mildly complex right renal cyst is again identified, without significant change in size measuring 4.6 cm in diameter. There is a thin mural calcification but has developed in the interim. If further evaluation is desired, dedicated MRI could be considered on a nonemergent outpatient basis. There is mild fat stranding surrounding the lower pole left kidney and left renal pelvis, which could reflect underlying infection. Evaluation of the parenchyma is limited without IV contrast. Bladder is grossly normal. The adrenal glands are unremarkable. Stomach/Bowel: No  bowel obstruction or ileus. Normal appendix right lower quadrant. No bowel wall thickening or inflammatory change. Vascular/Lymphatic: Aortic atherosclerosis. No  enlarged abdominal or pelvic lymph nodes. Reproductive: Prostate is unremarkable. Other: Small fat containing left inguinal hernia. No free fluid or free gas. Musculoskeletal: No acute or destructive bony lesions. Extensive multilevel spondylosis. Reconstructed images demonstrate no additional findings. IMPRESSION: 1. Mild fat stranding surrounding the lower pole left kidney and left renal pelvis, which could reflect underlying infection. Evaluation of the parenchyma is limited without IV contrast. 2. No urinary tract calculi or obstructive uropathy. 3. Stable mildly complex right renal cyst, with interval development of a thin mural calcification. If further evaluation is desired, dedicated MRI could be considered on a nonemergent outpatient basis. 4. Small fat containing left inguinal hernia. 5. Aortic Atherosclerosis (ICD10-I70.0). Electronically Signed   By: Randa Ngo M.D.   On: 05/25/2019 20:36    Procedures Procedures (including critical care time)  Medications Ordered in ED Medications  sodium chloride 0.9 % bolus 500 mL (0 mLs Intravenous Stopped 05/25/19 2107)  cefTRIAXone (ROCEPHIN) 1 g in sodium chloride 0.9 % 100 mL IVPB (0 g Intravenous Stopped 05/25/19 2107)    ED Course  I have reviewed the triage vital signs and the nursing notes.  Pertinent labs & imaging results that were available during my care of the patient were reviewed by me and considered in my medical decision making (see chart for details).    MDM Rules/Calculators/A&P                      Colin Lopez is a 79 year old male with a past medical history of CAD status post PCI, hypertension, hyperlipidemia, prior CVA presenting with 6 weeks of urinary changes found to have an elevated white count and acute kidney failure at his PCPs office.  Patient with no prior history of prostate or urinary problems presenting with 6 weeks of urinary frequency, dysuria, slow stream, frequent nighttime awakenings and multiple episodes  of urinary incontinence who presented to his PCP yesterday and was found to have a UA with signs of infection.  At that time PCP obtained lab work which demonstrated white blood count of 22 and an acutely worsened creatinine of 2.38, up from recent baseline of around 1.4.  Triage labs indicated worsening of creatinine today to 2.50 and an improved white blood count down to 10 since patient began Cipro yesterday.  Of note, his PCP also obtained a PSA which was elevated at 10.52.  Symptoms concerning for bladder outlet obstruction, possibly secondary to prostatic enlargement based on sx.  PSA can be elevated and prostatic inflammation or infection but this could also be obstruction due to enlarged prostate from malignancy.  His infection is likely secondary to the obstruction.  Will obtain bladder scan and provide Foley if necessary.  If patient urinated or Foley in place will provide fluids.  Will obtain UA here.  Will obtain CT renal stone for evaluation of kidneys, ureters and bladder.  Final Clinical Impression(s) / ED Diagnoses Final diagnoses:  Urinary tract infection without hematuria, site unspecified  AKI (acute kidney injury) ([place])   Bladder scan w/ 485 ml and pt reported he couldn't urinate Later urinated 300 ml; no foley placed at this time   Gave 500 ml fluid and one dose of Ceftriaxone  UA w/ large leuks, >50 WBC and many bacteria; cx pending  CT Renal Stone Study w/ fat stranding around L kidney concerning for infection, study limited  as no contrast used  Given AKI, UTI and likely pyelo, will admit to hospital for further work up and management  Rx / DC Orders ED Discharge Orders    None       Al Decant, MD 05/25/19 2113    Quintella Reichert, MD 05/25/19 2227

## 2019-05-25 NOTE — ED Notes (Signed)
Per Dr. Alcario Drought no foley needed at this time. Order discontinued.

## 2019-05-25 NOTE — ED Notes (Signed)
Pt transported to CT ?

## 2019-05-25 NOTE — Telephone Encounter (Signed)
Noted, pt on abx

## 2019-05-25 NOTE — ED Notes (Signed)
Secure message sent to Dr. Alcario Drought to clarify order for foley cath.

## 2019-05-25 NOTE — ED Notes (Signed)
Bladder scan 441mL Pt given urinal. Pt states he is unable to provide sample at this time.

## 2019-05-25 NOTE — H&P (Addendum)
History and Physical    Colin Lopez [id number] DOB: 1940-08-06 DOA: 05/25/2019  PCP: Jearld Fenton, NP  Patient coming from: Home  I have personally briefly reviewed patient's old medical records in [place]  Chief Complaint: AKI  HPI: Colin Lopez is a 79 y.o. male with medical history significant of CAD s/p PCI, HTN, prior CVA.  Pt presents to ED with c/o 6 week h/o dysuria, urinary frequency.  Progressed to B flank pain.  No reported fevers / chills.  Went to PCP yesterday.  Lab work revealed UTI (started on cipro), further lab work would come back today showing AKI and PSA of 10.  Pt sent in to ED due to AKI.   ED Course: WBC 10k down from 21k yesterday.  Creat 2.5 (same as yesterday, up from baseline 1.47 in Dec 2020).  UA still with large LE, many bacteria, and > 50 WBC.  CT renal stone study: no obstruction, nl appearing prostate, pyelonephritis of L kidney.  Stable complex cyst of R kidney.   Review of Systems: As per HPI, otherwise all review of systems negative.  Past Medical History:  Diagnosis Date  . CAD (coronary artery disease), native coronary artery    09/09/16 PCI/DES x3 to mRCA, OM1 and dLcx/OM2.   . Coronary disease    Status post stenting of the left circumflex coronary in 2009 with a bare-metal stent (with a 3.5x27mm Liberte stent)  . Dyslipidemia   . 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 Hyperlipidemia   . Hypertension   . NSTEMI (non-ST elevated myocardial infarction) ([place]) 2009   BMS CFX  . Stroke ([place])   . TIA (transient ischemic attack)    history of tia    Past Surgical History:  Procedure Laterality Date  . CARDIOVASCULAR STRESS TEST  10-03-08   EF 59%  . CORONARY ANGIOPLASTY WITH STENT PLACEMENT  09/09/2016  . CORONARY STENT INTERVENTION N/A 09/09/2016   Procedure: CORONARY STENT INTERVENTION;  Surgeon: [name], [name] M, MD;  Location: [facility];  Service: Cardiovascular;  Laterality: N/A;  . LEFT HEART CATH AND CORONARY  ANGIOGRAPHY N/A 09/09/2016   Procedure: LEFT HEART CATH AND CORONARY ANGIOGRAPHY;  Surgeon: [name], [name] M, MD;  Location: [facility];  Service: Cardiovascular;  Laterality: N/A;  . US ECHOCARDIOGRAPHY  09-21-08   EF 55-60%     reports that he has never smoked. He has never used smokeless tobacco. He reports current alcohol use. He reports that he does not use drugs.  Allergies  Allergen Reactions  . Penicillins Hives    Has patient had a PCN reaction causing immediate rash, facial/tongue/throat swelling, SOB or lightheadedness with hypotension: No Has patient had a PCN reaction causing severe rash involving mucus membranes or skin necrosis: Yes Has patient had a PCN reaction that required hospitalization: No Has patient had a PCN reaction occurring within the last 10 years: No If all of the above answers are "NO", then may proceed with Cephalosporin use.   [name] Doxycycline Rash    Water blisters  . Levaquin [Levofloxacin In D5w] Rash    Family History  Problem Relation Age of Onset  . Diabetes Mother   . CAD Sister 34       MI, obese  . Cancer Brother        stomach     Prior to Admission medications   Medication Sig Start Date End Date Taking? Authorizing Provider  acetaminophen (TYLENOL) 500 MG tablet Take  1,000 mg by mouth every 6 (six) hours as needed for headache (pain).    [provider]  aspirin EC 81 MG tablet Take 1 tablet (81 mg total) by mouth daily. 11/06/13   [name], [name] M, MD  atorvastatin (LIPITOR) 80 MG tablet Take 1 tablet (80 mg total) by mouth daily. 12/30/18 05/24/19  [name], [name] M, MD  ciprofloxacin (CIPRO) 500 MG tablet Take 1 tablet (500 mg total) by mouth 2 (two) times daily. 05/24/19   Jearld Fenton, NP  clopidogrel (PLAVIX) 75 MG tablet Take 1 tablet by mouth once daily with breakfast 12/29/18   [name], [name] M, MD  ezetimibe (ZETIA) 10 MG tablet Take 1 tablet (10 mg total) by mouth daily. 04/13/19 07/12/19  [name], [name] M, MD    fenofibrate 160 MG tablet Take 1 tablet (160 mg total) by mouth daily. 12/29/18   [name], [name] M, MD  Krill Oil 1000 MG CAPS Take 1,000 mg by mouth at bedtime.    [provider]  nebivolol (BYSTOLIC) 10 MG tablet Take 1 tablet (10 mg total) by mouth daily. 12/29/18   [name], [name] M, MD  nitroGLYCERIN (NITROSTAT) 0.4 MG SL tablet Place 1 tablet (0.4 mg total) under the tongue every 5 (five) minutes as needed for chest pain. 12/29/18   [name], [name] M, MD  Omega-3 1000 MG CAPS Take 4,000 mg by mouth daily.    [provider]  ondansetron (ZOFRAN) 4 MG tablet Take 1 tablet (4 mg total) by mouth every 8 (eight) hours as needed. 05/24/19   Jearld Fenton, NP  OVER THE COUNTER MEDICATION Apply 1 application topically daily as needed (pain). Horse Liniment otc cream    [provider]  pantoprazole (PROTONIX) 40 MG tablet TAKE 1 TABLET BY MOUTH EVERY DAY NEEDS APPT FOR REFILLS 11/18/18   [name], [name] M, MD  telmisartan (MICARDIS) 80 MG tablet Take 1 tablet (80 mg total) by mouth daily. 12/29/18   [name], [name] M, MD    Physical Exam: Vitals:   05/25/19 1619  BP: (!) 123/55  Pulse: 74  Resp: 16  Temp: 97.7 F (36.5 C)  TempSrc: Oral  SpO2: 95%  Weight: 95.3 kg  Height: 6' (1.829 m)    Constitutional: NAD, calm, comfortable Eyes: PERRL, lids and conjunctivae normal ENMT: Mucous membranes are moist. Posterior pharynx clear of any exudate or lesions.Normal dentition.  Neck: normal, supple, no masses, no thyromegaly Respiratory: clear to auscultation bilaterally, no wheezing, no crackles. Normal respiratory effort. No accessory muscle use.  Cardiovascular: Regular rate and rhythm, no murmurs / rubs / gallops. No extremity edema. 2+ pedal pulses. No carotid bruits.  Abdomen: no tenderness, no masses palpated. No hepatosplenomegaly. Bowel sounds positive.  Musculoskeletal: no clubbing / cyanosis. No joint deformity upper and lower extremities. Good ROM, no  contractures. Normal muscle tone.  Skin: no rashes, lesions, ulcers. No induration Neurologic: CN 2-12 grossly intact. Sensation intact, DTR normal. Strength 5/5 in all 4.  Psychiatric: Normal judgment and insight. Alert and oriented x 3. Normal mood.    Labs on Admission: I have personally reviewed following labs and imaging studies  CBC: Recent Labs  Lab 05/24/19 1548 05/25/19 1636  WBC 21.2 Repeated and verified X2.* 10.9*  NEUTROABS  --  9.0*  HGB 11.6* 10.8*  HCT 35.4* 34.1*  MCV 93.0 95.3  PLT 129.0* 123XX123*   Basic Metabolic Panel: Recent Labs  Lab 05/24/19 1548 05/25/19 1636  NA 133* 136  K 4.0 4.1  CL  101 107  CO2 23 19*  GLUCOSE 107* 140*  BUN 39* 45*  CREATININE 2.38* 2.50*  CALCIUM 8.4 8.5*   GFR: Estimated Creatinine Clearance: 28.7 mL/min (A) (by C-G formula based on SCr of 2.5 mg/dL (H)). Liver Function Tests: Recent Labs  Lab 05/24/19 1548 05/25/19 1636  AST 21 30  ALT 16 24  ALKPHOS 48 47  BILITOT 1.1 1.1  PROT 6.0 6.2*  ALBUMIN 3.7 3.1*   No results for input(s): LIPASE, AMYLASE in the last 168 hours. No results for input(s): AMMONIA in the last 168 hours. Coagulation Profile: No results for input(s): INR, PROTIME in the last 168 hours. Cardiac Enzymes: No results for input(s): CKTOTAL, CKMB, CKMBINDEX, TROPONINI in the last 168 hours. BNP (last 3 results) No results for input(s): PROBNP in the last 8760 hours. HbA1C: No results for input(s): HGBA1C in the last 72 hours. CBG: No results for input(s): GLUCAP in the last 168 hours. Lipid Profile: No results for input(s): CHOL, HDL, LDLCALC, TRIG, CHOLHDL, LDLDIRECT in the last 72 hours. Thyroid Function Tests: No results for input(s): TSH, T4TOTAL, FREET4, T3FREE, THYROIDAB in the last 72 hours. Anemia Panel: No results for input(s): VITAMINB12, FOLATE, FERRITIN, TIBC, IRON, RETICCTPCT in the last 72 hours. Urine analysis:    Component Value Date/Time   COLORURINE YELLOW 05/25/2019  1844   APPEARANCEUR CLOUDY (A) 05/25/2019 1844   APPEARANCEUR Clear 11/24/2012 1910   LABSPEC 1.010 05/25/2019 1844   LABSPEC 1.017 11/24/2012 1910   PHURINE 5.0 05/25/2019 1844   GLUCOSEU NEGATIVE 05/25/2019 1844   GLUCOSEU Negative 11/24/2012 1910   HGBUR SMALL (A) 05/25/2019 1844   BILIRUBINUR NEGATIVE 05/25/2019 1844   BILIRUBINUR Negative 05/24/2019 1529   BILIRUBINUR Negative 11/24/2012 [place] 05/25/2019 1844   PROTEINUR NEGATIVE 05/25/2019 1844   UROBILINOGEN negative (A) 05/24/2019 1529   UROBILINOGEN 1.0 07/11/2013 2253   NITRITE NEGATIVE 05/25/2019 1844   LEUKOCYTESUR LARGE (A) 05/25/2019 1844   LEUKOCYTESUR Negative 11/24/2012 1910    Radiological Exams on Admission: DG Chest Portable 1 View  Result Date: 05/25/2019 CLINICAL DATA:  Shortness of breath EXAM: PORTABLE CHEST 1 VIEW COMPARISON:  02/22/2018 FINDINGS: Cardiomegaly. No confluent airspace opacity, effusion or edema. No acute bony abnormality. IMPRESSION: Cardiomegaly.  No active disease. Electronically Signed   By: Rolm Baptise M.D.   On: 05/25/2019 18:31   CT Renal Stone Study  Result Date: 05/25/2019 CLINICAL DATA:  Flank pain, elevated white blood cell count EXAM: CT ABDOMEN AND PELVIS WITHOUT CONTRAST TECHNIQUE: Multidetector CT imaging of the abdomen and pelvis was performed following the standard protocol without IV contrast. COMPARISON:  11/24/2012 FINDINGS: Lower chest: No acute pleural or parenchymal lung disease. Hepatobiliary: No focal liver abnormality is seen. No gallstones, gallbladder wall thickening, or biliary dilatation. Pancreas: Unremarkable. No pancreatic ductal dilatation or surrounding inflammatory changes. Spleen: Normal in size without focal abnormality. Adrenals/Urinary Tract: No urinary tract calculi or obstructive uropathy within either kidney. Mildly complex right renal cyst is again identified, without significant change in size measuring 4.6 cm in diameter. There is a  thin mural calcification but has developed in the interim. If further evaluation is desired, dedicated MRI could be considered on a nonemergent outpatient basis. There is mild fat stranding surrounding the lower pole left kidney and left renal pelvis, which could reflect underlying infection. Evaluation of the parenchyma is limited without IV contrast. Bladder is grossly normal. The adrenal glands are unremarkable. Stomach/Bowel: No bowel obstruction or ileus. Normal appendix right lower  quadrant. No bowel wall thickening or inflammatory change. Vascular/Lymphatic: Aortic atherosclerosis. No enlarged abdominal or pelvic lymph nodes. Reproductive: Prostate is unremarkable. Other: Small fat containing left inguinal hernia. No free fluid or free gas. Musculoskeletal: No acute or destructive bony lesions. Extensive multilevel spondylosis. Reconstructed images demonstrate no additional findings. IMPRESSION: 1. Mild fat stranding surrounding the lower pole left kidney and left renal pelvis, which could reflect underlying infection. Evaluation of the parenchyma is limited without IV contrast. 2. No urinary tract calculi or obstructive uropathy. 3. Stable mildly complex right renal cyst, with interval development of a thin mural calcification. If further evaluation is desired, dedicated MRI could be considered on a nonemergent outpatient basis. 4. Small fat containing left inguinal hernia. 5. Aortic Atherosclerosis (ICD10-I70.0). Electronically Signed   By: Randa Ngo M.D.   On: 05/25/2019 20:36    EKG: Independently reviewed.  Assessment/Plan Principal Problem:   Acute pyelonephritis Active Problems:   Essential hypertension   AKI (acute kidney injury) ([place])   Acute lower UTI   Elevated PSA    1. UTI and pyelonephritis - 1. Rocephin 2. UCx pending 2. AKI - 1. Hold ARB 2. IVF: 500cc bolus in ED and NS at 100 cc/hr 3. Repeat BMP in AM 4. No obstructive findings on CT today 5. Nephrology consult if  AKI not improving 3. Elevated PSA - 1. PSA elevation could just be secondary to UTI 2. Nl appearing prostate on CT today 3. Get repeat PSA as outpt after UTI resolved 4. HTN - 1. Hold ARB 2. Cont other home BP meds 5. CAD - 1. Cont ASA, statin, plavix 6. R renal cyst - 1. Mildly complex but stable since 2014 other than interval development of thin mural calcification. 2. Could consider MRI as outpt, but this remains essentially stable for past 7 years and didn't sound like the radiologist was too impressed either.  DVT prophylaxis: Lovenox Code Status: Full Family Communication: No family in room Disposition Plan: Home after renal function improves Consults called: None Admission status: Admit to inpatient  Severity of Illness: The appropriate patient status for this patient is INPATIENT. Inpatient status is judged to be reasonable and necessary in order to provide the required intensity of service to ensure the patient's safety. The patient's presenting symptoms, physical exam findings, and initial radiographic and laboratory data in the context of their chronic comorbidities is felt to place them at high risk for further clinical deterioration. Furthermore, it is not anticipated that the patient will be medically stable for discharge from the hospital within 2 midnights of admission. The following factors support the patient status of inpatient.   IP status due to AKI with creat 2.5 up from 1.4 baseline.  This in setting of pyelonephritis.   * I certify that at the point of admission it is my clinical judgment that the patient will require inpatient hospital care spanning beyond 2 midnights from the point of admission due to high intensity of service, high risk for further deterioration and high frequency of surveillance required.*    Blayde Bacigalupi M. DO [facility]  How to contact the [facility] Attending or Consulting provider [place] or covering provider during after hours  [place], for this patient?  1. Check the care team in [facility] and look for a) attending/consulting [facility] provider listed and b) the [facility] team listed 2. Log into [email or web address]  Amion Physician Scheduling and messaging for groups and whole hospitals  On call and physician scheduling software for group  practices, residents, hospitalists and other medical providers for call, clinic, rotation and shift schedules. OnCall [employer] is a hospital-wide system for scheduling doctors and paging doctors on call. EasyPlot is for scientific plotting and data analysis.  [email or web address]  and use [place]'s universal password to access. If you do not have the password, please contact the hospital operator.  3. Locate the [facility] provider you are looking for under [facility] and page to a number that you can be directly reached. 4. If you still have difficulty reaching the provider, please page the [facility] (Director on Call) for the Hospitalists listed on amion for assistance.  05/25/2019, 9:26 PM

## 2019-05-25 NOTE — ED Triage Notes (Signed)
Patient stated he was sent by Doctors office d/t abnormal labs, increased white count and concern for kidney problems.

## 2019-05-26 ENCOUNTER — Encounter (HOSPITAL_COMMUNITY): Payer: Self-pay | Admitting: Internal Medicine

## 2019-05-26 ENCOUNTER — Other Ambulatory Visit: Payer: Self-pay

## 2019-05-26 LAB — CULTURE, URINE COMPREHENSIVE
MICRO NUMBER:: 10469371
SPECIMEN QUALITY:: ADEQUATE

## 2019-05-26 LAB — CBC
HCT: 32.5 % — ABNORMAL LOW (ref 39.0–52.0)
Hemoglobin: 10.6 g/dL — ABNORMAL LOW (ref 13.0–17.0)
MCH: 30.9 pg (ref 26.0–34.0)
MCHC: 32.6 g/dL (ref 30.0–36.0)
MCV: 94.8 fL (ref 80.0–100.0)
Platelets: 108 10*3/uL — ABNORMAL LOW (ref 150–400)
RBC: 3.43 MIL/uL — ABNORMAL LOW (ref 4.22–5.81)
RDW: 14.3 % (ref 11.5–15.5)
WBC: 8.5 10*3/uL (ref 4.0–10.5)
nRBC: 0 % (ref 0.0–0.2)

## 2019-05-26 LAB — BASIC METABOLIC PANEL
Anion gap: 9 (ref 5–15)
BUN: 36 mg/dL — ABNORMAL HIGH (ref 8–23)
CO2: 19 mmol/L — ABNORMAL LOW (ref 22–32)
Calcium: 8.3 mg/dL — ABNORMAL LOW (ref 8.9–10.3)
Chloride: 110 mmol/L (ref 98–111)
Creatinine, Ser: 1.82 mg/dL — ABNORMAL HIGH (ref 0.61–1.24)
GFR calc Af Amer: 40 mL/min — ABNORMAL LOW (ref >60)
GFR calc non Af Amer: 35 mL/min — ABNORMAL LOW (ref >60)
Glucose, Bld: 113 mg/dL — ABNORMAL HIGH (ref 70–99)
Potassium: 3.9 mmol/L (ref 3.5–5.1)
Sodium: 138 mmol/L (ref 135–145)

## 2019-05-26 LAB — PROCALCITONIN: Procalcitonin: 0.19 ng/mL

## 2019-05-26 LAB — MAGNESIUM: Magnesium: 2.1 mg/dL (ref 1.7–2.4)

## 2019-05-26 LAB — BRAIN NATRIURETIC PEPTIDE: B Natriuretic Peptide: 483.2 pg/mL — ABNORMAL HIGH (ref 0.0–100.0)

## 2019-05-26 MED ORDER — TAMSULOSIN HCL 0.4 MG PO CAPS
0.4000 mg | ORAL_CAPSULE | Freq: Every day | ORAL | Status: DC
  Administered 2019-05-26 – 2019-05-28 (×3): 0.4 mg via ORAL
  Filled 2019-05-26 (×2): qty 1

## 2019-05-26 MED ORDER — PRO-STAT SUGAR FREE PO LIQD
30.0000 mL | Freq: Two times a day (BID) | ORAL | Status: DC
  Administered 2019-05-26 – 2019-05-28 (×5): 30 mL via ORAL
  Filled 2019-05-26 (×5): qty 30

## 2019-05-26 MED ORDER — SODIUM CHLORIDE 0.9 % IV SOLN
1.0000 g | INTRAVENOUS | Status: DC
  Administered 2019-05-26 – 2019-05-27 (×2): 1 g via INTRAVENOUS
  Filled 2019-05-26: qty 1
  Filled 2019-05-26 (×2): qty 10

## 2019-05-26 MED ORDER — SODIUM CHLORIDE 0.9 % IV SOLN: INTRAVENOUS | Status: AC

## 2019-05-26 MED ORDER — BOOST / RESOURCE BREEZE PO LIQD CUSTOM
1.0000 | Freq: Three times a day (TID) | ORAL | Status: DC
  Administered 2019-05-26 – 2019-05-27 (×4): 1 via ORAL

## 2019-05-26 NOTE — Progress Notes (Signed)
Admitted to 5M19 via stretcher from ED. No family with patient. Assisted to bed. Call bell placed within reach. Instructed in the use of call bell and bed controls.Only belongings brought with patient were a watch and clothing.

## 2019-05-26 NOTE — Progress Notes (Signed)
Initial Nutrition Assessment  DOCUMENTATION CODES:   Not applicable  INTERVENTION:  Boost Breeze po TID, each supplement provides 250 kcal and 9 grams of protein  36ml Pro-stat BID po, each supplement provides 100 kcal and 15 grams protein   NUTRITION DIAGNOSIS:   Inadequate oral intake related to poor appetite as evidenced by per patient/family report.    GOAL:   Patient will meet greater than or equal to 90% of their needs    MONITOR:   PO intake, Supplement acceptance, Labs, I & O's  REASON FOR ASSESSMENT:   Malnutrition Screening Tool    ASSESSMENT:   Pt presented with 6 week h/o dysuria and urinary frequency and admitted for acute pyelonephritis and AKI. PMH significant of CAD s/p PCI, HTN, prior CVA.  Pt reports very poor appetite for 1 week, but states appetite was great prior to that. Pt agreeable to using oral nutrition supplements while admitted, though reports thinking he cannot have dairy products.  Per wt readings, no significant wt changes in last year.   PO intake: 100% x 1 recorded meal  Labs and medications reviewed.  UOP: 1,754ml x24 hours I/O: -649.37ml since admit  NUTRITION - FOCUSED PHYSICAL EXAM:    Most Recent Value  Orbital Region  No depletion  Upper Arm Region  No depletion  Thoracic and Lumbar Region  No depletion  Buccal Region  No depletion  Temple Region  No depletion  Clavicle Bone Region  No depletion  Clavicle and Acromion Bone Region  No depletion  Scapular Bone Region  No depletion  Dorsal Hand  No depletion  Patellar Region  No depletion  Anterior Thigh Region  No depletion  Posterior Calf Region  No depletion  Edema (RD Assessment)  None  Hair  Reviewed  Eyes  Reviewed  Mouth  Reviewed  Skin  Reviewed  Nails  Reviewed       Diet Order:   Diet Order            Diet Heart Room service appropriate? Yes; Fluid consistency: Thin  Diet effective now              EDUCATION NEEDS:   No education needs  have been identified at this time  Skin:  Skin Assessment: Skin Integrity Issues: Skin Integrity Issues:: Other (Comment) Other: MASD anus  Last BM:  5/14 type 5  Height:   Ht Readings from Last 1 Encounters:  05/26/19 6' (1.829 m)    Weight:   Wt Readings from Last 5 Encounters:  05/26/19 93.2 kg  05/24/19 95.4 kg  02/16/19 94.8 kg  12/29/18 94.8 kg  02/22/18 97.5 kg    BMI:  Body mass index is 27.87 kg/m.  Estimated Nutritional Needs:   Kcal:  2100-2300  Protein:  105-115 grams  Fluid:  >2L/d    Larkin Ina, MS, RD, LDN RD pager number and weekend/on-call pager number located in [place].

## 2019-05-26 NOTE — Progress Notes (Signed)
PROGRESS NOTE                                                                                                                                                                                                             Patient Demographics:    Colin Lopez, is a 79 y.o. male, DOB - February 27, 1940, [id number]  Admit date - 05/25/2019   Admitting Physician Etta Quill, DO  Outpatient Primary MD for the patient is Baity, Coralie Keens, NP  LOS - 1  Chief Complaint  Patient presents with  . Abnormal Lab       Brief Narrative - Colin Lopez is a 79 y.o. male with medical history significant of CAD s/p PCI, HTN, prior CVA, who was checked by PCP and found to have AKI and sent to the hospital where his work-up was suggestive of AKI along with UTI with the possibility of pyelonephritis and admitted to the hospital.   Subjective:    [name] today has, No headache, No chest pain, No abdominal pain - No Nausea, No new weakness tingling or numbness, No Cough - SOB.    Assessment  & Plan :     1. UTI, could have early left-sided pyelonephritis.  Overall improved on empiric Rocephin which will be continued, follow cultures, clinically seems to be responding and appears nontoxic.  Flomax added.  Will require outpatient urology follow-up as clinically highly suspicious for possible underlying BPH.  2.  CAD.  Chest pain-free continue combination of aspirin, plavix, statin and beta-blocker for secondary prevention.  3.  Dyslipidemia.  On statin, fenofibrate along with Zetia.  4.  GERD.  PPI.  5.  AKI.  Improving with hydration continue gentle IV fluids and monitor.  Currently stable post void residual.    Condition - Fair   Family Communication  :    Wife [name] [id number] [phone] called on 05/26/2019 at 8:41 AM.  No voicemail set. Son [name] [id number] on 05/26/19 was updated.   Code Status :  Full  Consults  :  None  Procedures  :    CT -  1. Mild fat stranding  surrounding the lower pole left kidney and left renal pelvis, which could reflect underlying infection. Evaluation of the parenchyma is limited without IV contrast. 2. No urinary tract calculi or obstructive uropathy. 3. Stable mildly complex right renal cyst, with interval development of a thin mural calcification. If further evaluation is desired, dedicated MRI could be considered on a nonemergent  outpatient basis. 4. Small fat containing left inguinal hernia. 5. Aortic Atherosclerosis  PUD Prophylaxis : PPI  Disposition Plan  :    Status is: Inpatient  Remains inpatient appropriate because:IV treatments appropriate due to intensity of illness or inability to take PO   Dispo: The patient is from: Home              Anticipated d/c is to: Home              Anticipated d/c date is: 3 days              Patient currently is not medically stable to d/c.   IV antibiotics for possible left-sided pyelonephritis    DVT Prophylaxis  :  Lovenox    Lab Results  Component Value Date   PLT 108 (L) 05/26/2019    Diet :  Diet Order            Diet Heart Room service appropriate? Yes; Fluid consistency: Thin  Diet effective now               Inpatient Medications Scheduled Meds: . aspirin EC  81 mg Oral Daily  . atorvastatin  80 mg Oral Daily  . clopidogrel  75 mg Oral Daily  . enoxaparin (LOVENOX) injection  40 mg Subcutaneous Q24H  . ezetimibe  10 mg Oral Daily  . fenofibrate  160 mg Oral Daily  . nebivolol  10 mg Oral Daily  . pantoprazole  40 mg Oral Daily  . tamsulosin  0.4 mg Oral Daily   Continuous Infusions: . sodium chloride 75 mL/hr at 05/26/19 0809   PRN Meds:.acetaminophen **OR** [DISCONTINUED] acetaminophen, [DISCONTINUED] ondansetron **OR** ondansetron (ZOFRAN) IV  Antibiotics  :   Anti-infectives (From admission, onward)   Start     Dose/Rate Route Frequency Ordered Stop   05/25/19 2000  cefTRIAXone (ROCEPHIN) 1 g in sodium chloride 0.9 % 100 mL IVPB     1  g 200 mL/hr over 30 Minutes Intravenous  Once 05/25/19 1927 05/25/19 2107          Objective:   Vitals:   05/25/19 2200 05/25/19 2317 05/26/19 0000 05/26/19 0418  BP: (!) 113/51 116/74 137/74 120/71  Pulse: 71 81 82 76  Resp: (!) 22 20 20 16   Temp:  99 F (37.2 C) 97.8 F (36.6 C) 98.5 F (36.9 C)  TempSrc:   Oral Oral  SpO2: 95% 95% 98% 95%  Weight:   93.2 kg   Height:   6' (1.829 m)     SpO2: 95 %  Wt Readings from Last 3 Encounters:  05/26/19 93.2 kg  05/24/19 95.4 kg  02/16/19 94.8 kg     Intake/Output Summary (Last 24 hours) at 05/26/2019 0842 Last data filed at 05/26/2019 0758 Gross per 24 hour  Intake 1110.6 ml  Output 1760 ml  Net -649.4 ml     Physical Exam  Awake Alert, No new F.N deficits, Normal affect [place].AT,PERRAL Supple Neck,No JVD, No cervical lymphadenopathy appriciated.  Symmetrical Chest wall movement, Good air movement bilaterally, CTAB RRR,No Gallops,Rubs or new Murmurs, No Parasternal Heave +ve B.Sounds, Abd Soft, No tenderness, No organomegaly appriciated, No rebound - guarding or rigidity. No Cyanosis, Clubbing or edema, No new Rash or bruise      Data Review:    Recent Labs  Lab 05/24/19 1548 05/25/19 1636 05/26/19 0500  WBC 21.2 Repeated and verified X2.* 10.9* 8.5  HGB 11.6* 10.8* 10.6*  HCT 35.4* 34.1* 32.5*  PLT 129.0* 121* 108*  MCV 93.0 95.3 94.8  MCH  --  30.2 30.9  MCHC 32.7 31.7 32.6  RDW 15.1 14.3 14.3  LYMPHSABS  --  0.9  --   MONOABS  --  0.9  --   EOSABS  --  0.0  --   BASOSABS  --  0.0  --     Recent Labs  Lab 05/24/19 1548 05/25/19 1636 05/26/19 0500  NA 133* 136 138  K 4.0 4.1 3.9  CL 101 107 110  CO2 23 19* 19*  GLUCOSE 107* 140* 113*  BUN 39* 45* 36*  CREATININE 2.38* 2.50* 1.82*  CALCIUM 8.4 8.5* 8.3*  AST 21 30  --   ALT 16 24  --   ALKPHOS 48 47  --   BILITOT 1.1 1.1  --   ALBUMIN 3.7 3.1*  --     Recent Labs  Lab 05/25/19 2155  SARSCOV2NAA NEGATIVE     ------------------------------------------------------------------------------------------------------------------ No results for input(s): CHOL, HDL, LDLCALC, TRIG, CHOLHDL, LDLDIRECT in the last 72 hours.  Lab Results  Component Value Date   HGBA1C 5.1 10/12/2016   ------------------------------------------------------------------------------------------------------------------ No results for input(s): TSH, T4TOTAL, T3FREE, THYROIDAB in the last 72 hours.  Invalid input(s): FREET3 ------------------------------------------------------------------------------------------------------------------ No results for input(s): VITAMINB12, FOLATE, FERRITIN, TIBC, IRON, RETICCTPCT in the last 72 hours.  Coagulation profile No results for input(s): INR, PROTIME in the last 168 hours.  No results for input(s): DDIMER in the last 72 hours.  Cardiac Enzymes No results for input(s): CKMB, TROPONINI, MYOGLOBIN in the last 168 hours.  Invalid input(s): CK ------------------------------------------------------------------------------------------------------------------ No results found for: BNP  Micro Results Recent Results (from the past 240 hour(s))  SARS Coronavirus 2 by RT PCR (hospital order, performed in [facility] hospital lab) Nasopharyngeal Nasopharyngeal Swab     Status: None   Collection Time: 05/25/19  9:55 PM   Specimen: Nasopharyngeal Swab  Result Value Ref Range Status   SARS Coronavirus 2 NEGATIVE NEGATIVE Final    Comment: (NOTE) SARS-CoV-2 target nucleic acids are NOT DETECTED. The SARS-CoV-2 RNA is generally detectable in upper and lower respiratory specimens during the acute phase of infection. The lowest concentration of SARS-CoV-2 viral copies this assay can detect is 250 copies / mL. A negative result does not preclude SARS-CoV-2 infection and should not be used as the sole basis for treatment or other patient management decisions.  A negative result may occur  with improper specimen collection / handling, submission of specimen other than nasopharyngeal swab, presence of viral mutation(s) within the areas targeted by this assay, and inadequate number of viral copies (<250 copies / mL). A negative result must be combined with clinical observations, patient history, and epidemiological information. Fact Sheet for Patients:   StrictlyIdeas.no Fact Sheet for Healthcare Providers: BankingDealers.co.za This test is not yet approved or cleared  by the [place] FDA and has been authorized for detection and/or diagnosis of SARS-CoV-2 by FDA under an Emergency Use Authorization (EUA).  This EUA will remain in effect (meaning this test can be used) for the duration of the COVID-19 declaration under Section 564(b)(1) of the Act, 21 U.S.C. section 360bbb-3(b)(1), unless the authorization is terminated or revoked sooner. Performed at [facility], [place] [address], [place], [place] [address]     Radiology Reports DG Chest Portable 1 View  Result Date: 05/25/2019 CLINICAL DATA:  Shortness of breath EXAM: PORTABLE CHEST 1 VIEW COMPARISON:  02/22/2018 FINDINGS: Cardiomegaly. No confluent airspace opacity, effusion or edema.  No acute bony abnormality. IMPRESSION: Cardiomegaly.  No active disease. Electronically Signed   By: Rolm Baptise M.D.   On: 05/25/2019 18:31   CT Renal Stone Study  Result Date: 05/25/2019 CLINICAL DATA:  Flank pain, elevated white blood cell count EXAM: CT ABDOMEN AND PELVIS WITHOUT CONTRAST TECHNIQUE: Multidetector CT imaging of the abdomen and pelvis was performed following the standard protocol without IV contrast. COMPARISON:  11/24/2012 FINDINGS: Lower chest: No acute pleural or parenchymal lung disease. Hepatobiliary: No focal liver abnormality is seen. No gallstones, gallbladder wall thickening, or biliary dilatation. Pancreas: Unremarkable. No pancreatic ductal dilatation or  surrounding inflammatory changes. Spleen: Normal in size without focal abnormality. Adrenals/Urinary Tract: No urinary tract calculi or obstructive uropathy within either kidney. Mildly complex right renal cyst is again identified, without significant change in size measuring 4.6 cm in diameter. There is a thin mural calcification but has developed in the interim. If further evaluation is desired, dedicated MRI could be considered on a nonemergent outpatient basis. There is mild fat stranding surrounding the lower pole left kidney and left renal pelvis, which could reflect underlying infection. Evaluation of the parenchyma is limited without IV contrast. Bladder is grossly normal. The adrenal glands are unremarkable. Stomach/Bowel: No bowel obstruction or ileus. Normal appendix right lower quadrant. No bowel wall thickening or inflammatory change. Vascular/Lymphatic: Aortic atherosclerosis. No enlarged abdominal or pelvic lymph nodes. Reproductive: Prostate is unremarkable. Other: Small fat containing left inguinal hernia. No free fluid or free gas. Musculoskeletal: No acute or destructive bony lesions. Extensive multilevel spondylosis. Reconstructed images demonstrate no additional findings. IMPRESSION: 1. Mild fat stranding surrounding the lower pole left kidney and left renal pelvis, which could reflect underlying infection. Evaluation of the parenchyma is limited without IV contrast. 2. No urinary tract calculi or obstructive uropathy. 3. Stable mildly complex right renal cyst, with interval development of a thin mural calcification. If further evaluation is desired, dedicated MRI could be considered on a nonemergent outpatient basis. 4. Small fat containing left inguinal hernia. 5. Aortic Atherosclerosis (ICD10-I70.0). Electronically Signed   By: Randa Ngo M.D.   On: 05/25/2019 20:36    Time Spent in minutes  30   Lala Lund M.D on 05/26/2019 at 8:42 AM  To page go to [email or web address] - password  [facility]

## 2019-05-26 NOTE — Evaluation (Signed)
Physical Therapy Evaluation Patient Details Name: Colin Lopez MRN: [id number] DOB: April 09, 1940 Today's Date: 05/26/2019   History of Present Illness  Pt is a 79 yo male presenting with abnormal labs, difficulty urinating, dysuria, and LBP. Upon admission, work up revealed AKI, UTI, and pyelonephritis of L kidney. PMH includes: HTN, NSTEMI, CAD, HTN, and prior CVA.  Clinical Impression  Pt sitting EOB upon arrival of PT, agreeable to evaluation at this time. Prior to admission the pt was completely independent with all mobility, still driving and working. The pt lives at home with his wife who could assist as needed following d/c, but who did not need to help at all PTA. The pt now presents with minor limitations in endurance and dynamic stability due to above dx, and will continue to benefit from skilled PT to address these deficits. The pt was able to demo independence with transfers in the room as well as hallway ambulation and stairs without AD. The pt had no LOB, but minor evidence of instability with prolonged ambulation. The pt will continue to benefit from skilled PT to maximize mobility and balance prior to d/c home to further reduce risk of falls, but is safe to return home with wife for supervision for mobility.      Follow Up Recommendations No PT follow up;Supervision for mobility/OOB    Equipment Recommendations  None recommended by PT    Recommendations for Other Services       Precautions / Restrictions Precautions Precautions: Fall Restrictions Weight Bearing Restrictions: No      Mobility  Bed Mobility Overal bed mobility: Independent                Transfers Overall transfer level: Modified independent Equipment used: None             General transfer comment: pt sitting EOB upon pt arrival, stood from EOB with good management of IV pole to reposition, no evidence of instability. stood with good useof hands and no evidence of instability without  AD  Ambulation/Gait Ambulation/Gait assistance: Min Gaffer (Feet): 250 Feet Assistive device: None Gait Pattern/deviations: WFL(Within Functional Limits);Step-through pattern Gait velocity: 0.5 m/s Gait velocity interpretation: 1.31 - 2.62 ft/sec, indicative of limited community ambulator General Gait Details: Pt with slight lateral movements, no LOB during ambulation. after 200 ft pt held rail in hallway for brief moment, but then continued walking. Supervision for safety. minG durign higher-level balance challenges  Stairs Stairs: Yes Stairs assistance: Min guard Stair Management: One rail Left;Alternating pattern;Forwards Number of Stairs: 3 General stair comments: alternating pattern up, step-to pattern down  Wheelchair Mobility    Modified Rankin (Stroke Patients Only)       Balance Overall balance assessment: Needs assistance Sitting-balance support: No upper extremity supported Sitting balance-Leahy Scale: Normal     Standing balance support: No upper extremity supported Standing balance-Leahy Scale: Good Standing balance comment: no UE support, supervision for safety                 Standardized Balance Assessment Standardized Balance Assessment : Dynamic Gait Index   Dynamic Gait Index Level Surface: Normal Change in Gait Speed: Mild Impairment Gait with Horizontal Head Turns: Normal Gait with Vertical Head Turns: Normal Gait and Pivot Turn: Mild Impairment Step Over Obstacle: Mild Impairment Step Around Obstacles: Mild Impairment Steps: Mild Impairment Total Score: 19       Pertinent Vitals/Pain Pain Assessment: No/denies pain    Home Living Family/patient expects to be discharged to::  Private residence Living Arrangements: Spouse/significant other Available Help at Discharge: Family;Available 24 hours/day Type of Home: House Home Access: Stairs to enter Entrance Stairs-Rails: Right;Left Entrance Stairs-Number of  Steps: 7 at front with B rails, cannot reach both. 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 working until day of admission     Hand Dominance   Dominant Hand: Right    Extremity/Trunk Assessment   Upper Extremity Assessment Upper Extremity Assessment: Overall WFL for tasks assessed    Lower Extremity Assessment Lower Extremity Assessment: Overall WFL for tasks assessed    Cervical / Trunk Assessment Cervical / Trunk Assessment: Normal  Communication   Communication: HOH  Cognition Arousal/Alertness: Awake/alert Behavior During Therapy: WFL for tasks assessed/performed Overall Cognitive Status: Within Functional Limits for tasks assessed                                 General Comments: difficult to fully assess cognition as pt wife answered most questions due to his [facility], but pt seemed to be tracking conversation. when he did speak it was appropriate      General Comments General comments (skin integrity, edema, etc.): VSS throughout, pt reports he has not been sleeping well x5 days PTA ad home, discussed sleep hygiene and sleep schedule    Exercises     Assessment/Plan    PT Assessment Patient needs continued PT services  PT Problem List Decreased mobility;Decreased range of motion;Decreased activity tolerance;Decreased balance;Decreased strength;Obesity       PT Treatment Interventions DME instruction;Therapeutic exercise;Gait training;Stair training;Balance training;Functional mobility training;Therapeutic activities;Patient/family education    PT Goals (Current goals can be found in the Care Plan section)  Acute Rehab PT Goals Patient Stated Goal: to get back  to work PT Goal Formulation: With patient Time For Goal Achievement: 06/09/19 Potential to Achieve Goals: Good    Frequency Min 2X/week   Barriers to discharge        Co-evaluation               AM-PAC PT "6 Clicks" Mobility  Outcome Measure Help needed turning from your back to your side while in a flat bed without using bedrails?: None Help needed moving from lying on your back to sitting on the side of a flat bed without using bedrails?: None Help needed moving to and from a bed to a chair (including a wheelchair)?: None Help needed standing up from a chair using your arms (e.g., wheelchair or bedside chair)?: None Help needed to walk in hospital room?: A Little Help needed climbing 3-5 steps with a railing? : A Little 6 Click Score: 22    End of Session Equipment Utilized During Treatment: Gait belt Activity Tolerance: Patient tolerated treatment well Patient left: in chair;with family/visitor present;with call bell/phone within reach Nurse Communication: Mobility status PT Visit Diagnosis: Difficulty in walking, not elsewhere classified (R26.2)    Time: [id number] PT Time Calculation (min) (ACUTE ONLY): 26 min   Charges:   PT Evaluation $PT Eval Moderate Complexity: 1 Mod PT Treatments $Gait Training: 8-22 mins        Karma Ganja, PT, DPT   Acute  Rehabilitation Department Pager #: [phone]  Otho Bellows 05/26/2019, 10:59 AM

## 2019-05-27 LAB — BASIC METABOLIC PANEL
Anion gap: 7 (ref 5–15)
BUN: 27 mg/dL — ABNORMAL HIGH (ref 8–23)
CO2: 23 mmol/L (ref 22–32)
Calcium: 8.5 mg/dL — ABNORMAL LOW (ref 8.9–10.3)
Chloride: 109 mmol/L (ref 98–111)
Creatinine, Ser: 1.46 mg/dL — ABNORMAL HIGH (ref 0.61–1.24)
GFR calc Af Amer: 52 mL/min — ABNORMAL LOW (ref >60)
GFR calc non Af Amer: 45 mL/min — ABNORMAL LOW (ref >60)
Glucose, Bld: 109 mg/dL — ABNORMAL HIGH (ref 70–99)
Potassium: 4.6 mmol/L (ref 3.5–5.1)
Sodium: 139 mmol/L (ref 135–145)

## 2019-05-27 LAB — CBC WITH DIFFERENTIAL/PLATELET
Abs Immature Granulocytes: 0.03 10*3/uL (ref 0.00–0.07)
Basophils Absolute: 0 10*3/uL (ref 0.0–0.1)
Basophils Relative: 1 %
Eosinophils Absolute: 0.1 10*3/uL (ref 0.0–0.5)
Eosinophils Relative: 2 %
HCT: 33.6 % — ABNORMAL LOW (ref 39.0–52.0)
Hemoglobin: 10.6 g/dL — ABNORMAL LOW (ref 13.0–17.0)
Immature Granulocytes: 1 %
Lymphocytes Relative: 22 %
Lymphs Abs: 1.1 10*3/uL (ref 0.7–4.0)
MCH: 30 pg (ref 26.0–34.0)
MCHC: 31.5 g/dL (ref 30.0–36.0)
MCV: 95.2 fL (ref 80.0–100.0)
Monocytes Absolute: 0.6 10*3/uL (ref 0.1–1.0)
Monocytes Relative: 11 %
Neutro Abs: 3.2 10*3/uL (ref 1.7–7.7)
Neutrophils Relative %: 63 %
Platelets: 125 10*3/uL — ABNORMAL LOW (ref 150–400)
RBC: 3.53 MIL/uL — ABNORMAL LOW (ref 4.22–5.81)
RDW: 14.6 % (ref 11.5–15.5)
WBC: 5 10*3/uL (ref 4.0–10.5)
nRBC: 0 % (ref 0.0–0.2)

## 2019-05-27 LAB — MAGNESIUM: Magnesium: 2.1 mg/dL (ref 1.7–2.4)

## 2019-05-27 LAB — PROCALCITONIN: Procalcitonin: 0.11 ng/mL

## 2019-05-27 LAB — BRAIN NATRIURETIC PEPTIDE: B Natriuretic Peptide: 84.4 pg/mL (ref 0.0–100.0)

## 2019-05-27 NOTE — Evaluation (Signed)
Occupational Therapy Evaluation Patient Details Name: Colin Lopez MRN: [id number] DOB: 05-May-1940 Today's Date: 05/27/2019    History of Present Illness Pt is a 79 yo male presenting with abnormal labs, difficulty urinating, dysuria, and LBP. Upon admission, work up revealed AKI, UTI, and pyelonephritis of L kidney. PMH includes: HTN, NSTEMI, CAD, HTN, and prior CVA.   Clinical Impression   Pt. Was able to perform ADLs without assist. Pt. Was Mod I for transfers. Pt. And wife state that patient is at previous level and they do not have concerns about him returning home. No further skilled ot needed.     Follow Up Recommendations  No OT follow up    Equipment Recommendations  None recommended by OT    Recommendations for Other Services       Precautions / Restrictions Precautions Precautions: Fall Restrictions Weight Bearing Restrictions: No      Mobility Bed Mobility Overal bed mobility: Independent                Transfers Overall transfer level: Modified independent Equipment used: None                  Balance                                           ADL either performed or assessed with clinical judgement   ADL Overall ADL's : Modified independent                                       General ADL Comments: Patient and wife expressed confidence that patient is at baseline and will be fine at home.      Vision Baseline Vision/History: No visual deficits Patient Visual Report: No change from baseline       Perception     Praxis      Pertinent Vitals/Pain Pain Assessment: No/denies pain     Hand Dominance Right   Extremity/Trunk Assessment Upper Extremity Assessment Upper Extremity Assessment: Overall WFL for tasks assessed(Pt. has decreased ROM of R shld from previous injury)           Communication Communication Communication: HOH   Cognition Arousal/Alertness: Awake/alert Behavior During  Therapy: WFL for tasks assessed/performed Overall Cognitive Status: Within Functional Limits for tasks assessed                                     General Comments       Exercises     Shoulder Instructions      Home Living Family/patient expects to be discharged to:: Private residence Living Arrangements: Spouse/significant other Available Help at Discharge: Family;Available 24 hours/day Type of Home: House Home Access: Stairs to enter [employer] of Steps: 7 at front with B rails, cannot reach both. 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 working until day of admission        OT Problem List:        OT Treatment/Interventions:      OT Goals(Current goals can be found in the care plan section) Acute Rehab OT Goals Patient Stated Goal: go home  OT Frequency:     Barriers to D/C:            Co-evaluation              AM-PAC OT "6 Clicks" Daily Activity     Outcome Measure Help from another person eating meals?: None Help from another person taking care of personal grooming?: None Help from another person toileting, which includes using toliet, bedpan, or urinal?: None Help from another person bathing (including washing, rinsing, drying)?: None Help from another person to put on and taking off regular upper body clothing?: None Help from another person to put on and taking off regular lower body clothing?: None 6 Click Score: 24   End of Session Nurse Communication: (ok therapy)  Activity Tolerance:  Patient tolerated treatment well Patient left: in chair;with family/visitor present                   Time: 1255-1317 OT Time Calculation (min): 22 min Charges:     Reece Packer OT/L   Deshanti Adcox 05/27/2019, 1:26 PM

## 2019-05-27 NOTE — Progress Notes (Signed)
PROGRESS NOTE                                                                                                                                                                                                             Patient Demographics:    Colin Lopez, is a 79 y.o. male, DOB - [birth year], PX:[id number]  Admit date - 05/25/2019   Admitting Physician Etta Quill, DO  Outpatient Primary MD for the patient is Baity, Coralie Keens, NP  LOS - 2  Chief Complaint  Patient presents with  . Abnormal Lab       Brief Narrative - Colin Lopez is a 79 y.o. male with medical history significant of CAD s/p PCI, HTN, prior CVA, who was checked by PCP and found to have AKI and sent to the hospital where his work-up was suggestive of AKI along with UTI with the possibility of pyelonephritis and admitted to the hospital.   Subjective:   Patient in bed, appears comfortable, denies any headache, no fever, no chest pain or pressure, no shortness of breath , no abdominal pain. No focal weakness.    Assessment  & Plan :     1. UTI most likely E. coli even though urine culture not completely conclusive, could have early left-sided pyelonephritis.  Overall improved on empiric Rocephin which will be continued, UC appears inconclusive , clinically seems to be responding and appears nontoxic.  Flomax added.  Will require outpatient urology follow-up as clinically highly suspicious for possible underlying BPH.  Will give 1 week of oral Keflex upon discharge as there is hint of pyelonephritis on CT scan.  2.  CAD.  Chest pain-free continue combination of aspirin, plavix, statin and beta-blocker for secondary prevention.  3.  Dyslipidemia.  On statin, fenofibrate along with Zetia.  4.  GERD.  PPI.  5.  AKI.  Improved after aggressive hydration with IV fluids.  PCP to repeat BMP in a week post discharge.    Condition - Fair   Family Communication  :    Wife [name] [id number] [phone] called on  05/26/2019 at 8:41 AM.  No voicemail set. Son [name] [id number] on 05/26/19 was updated.   Code Status :  Full  Consults  :  None  Procedures  :    CT -  1. Mild fat stranding surrounding the lower pole left kidney and left renal pelvis, which could reflect underlying infection. Evaluation of the parenchyma is limited without IV contrast. 2. No  urinary tract calculi or obstructive uropathy. 3. Stable mildly complex right renal cyst, with interval development of a thin mural calcification. If further evaluation is desired, dedicated MRI could be considered on a nonemergent outpatient basis. 4. Small fat containing left inguinal hernia. 5. Aortic Atherosclerosis  PUD Prophylaxis : PPI  Disposition Plan  :    Status is: Inpatient  Remains inpatient appropriate because:IV treatments appropriate due to intensity of illness or inability to take PO   Dispo: The patient is from: Home              Anticipated d/c is to: Home              Anticipated d/c date is: DC home in a.m. on oral antibiotics if he remains stable              Patient currently is not medically stable to d/c.   IV antibiotics for possible left-sided pyelonephritis    DVT Prophylaxis  :  Lovenox    Lab Results  Component Value Date   PLT 125 (L) 05/27/2019    Diet :  Diet Order            Diet Heart Room service appropriate? Yes; Fluid consistency: Thin  Diet effective now               Inpatient Medications Scheduled Meds: . aspirin EC  81 mg Oral Daily  . atorvastatin  80 mg Oral Daily  . clopidogrel  75 mg Oral Daily  . enoxaparin (LOVENOX) injection  40 mg Subcutaneous Q24H  . ezetimibe  10 mg Oral Daily  . feeding supplement  1 Container Oral TID BM  . feeding supplement (PRO-STAT SUGAR FREE 64)  30 mL Oral BID  . fenofibrate  160 mg Oral Daily  . nebivolol  10 mg Oral Daily  . pantoprazole  40 mg Oral Daily  . tamsulosin  0.4 mg Oral Daily   Continuous Infusions: . sodium chloride 75 mL/hr  at 05/27/19 0918  . cefTRIAXone (ROCEPHIN)  IV 1 g (05/26/19 2105)   PRN Meds:.acetaminophen **OR** [DISCONTINUED] acetaminophen, [DISCONTINUED] ondansetron **OR** ondansetron (ZOFRAN) IV  Antibiotics  :   Anti-infectives (From admission, onward)   Start     Dose/Rate Route Frequency Ordered Stop   05/26/19 2000  cefTRIAXone (ROCEPHIN) 1 g in sodium chloride 0.9 % 100 mL IVPB     1 g 200 mL/hr over 30 Minutes Intravenous Every 24 hours 05/26/19 1207     05/25/19 2000  cefTRIAXone (ROCEPHIN) 1 g in sodium chloride 0.9 % 100 mL IVPB     1 g 200 mL/hr over 30 Minutes Intravenous  Once 05/25/19 1927 05/25/19 2107          Objective:   Vitals:   05/26/19 1309 05/26/19 1653 05/27/19 0413 05/27/19 0944  BP: 115/61 106/62 106/70 126/64  Pulse: 67 64 74 61  Resp: 18 18 18 18   Temp: 98.2 F (36.8 C) (!) 97.5 F (36.4 C) 97.6 F (36.4 C) (!) 97.5 F (36.4 C)  TempSrc: Oral Oral Oral Oral  SpO2: 96% 94% 93% 94%  Weight:   94.4 kg   Height:        SpO2: 94 %  Wt Readings from Last 3 Encounters:  05/27/19 94.4 kg  05/24/19 95.4 kg  02/16/19 94.8 kg     Intake/Output Summary (Last 24 hours) at 05/27/2019 1001 Last data filed at 05/27/2019 0919 Gross per 24 hour  Intake 1567.37 ml  Output 1415 ml  Net 152.37 ml     Physical Exam  Awake Alert, No new F.N deficits, Normal affect .AT,PERRAL Supple Neck,No JVD, No cervical lymphadenopathy appriciated.  Symmetrical Chest wall movement, Good air movement bilaterally, CTAB RRR,No Gallops, Rubs or new Murmurs, No Parasternal Heave +ve B.Sounds, Abd Soft, No tenderness, No organomegaly appriciated, No rebound - guarding or rigidity. No Cyanosis, Clubbing or edema, No new Rash or bruise    Data Review:    Recent Labs  Lab 05/24/19 1548 05/25/19 1636 05/26/19 0500 05/27/19 0420  WBC 21.2 Repeated and verified X2.* 10.9* 8.5 5.0  HGB 11.6* 10.8* 10.6* 10.6*  HCT 35.4* 34.1* 32.5* 33.6*  PLT 129.0* 121* 108* 125*    MCV 93.0 95.3 94.8 95.2  MCH  --  30.2 30.9 30.0  MCHC 32.7 31.7 32.6 31.5  RDW 15.1 14.3 14.3 14.6  LYMPHSABS  --  0.9  --  1.1  MONOABS  --  0.9  --  0.6  EOSABS  --  0.0  --  0.1  BASOSABS  --  0.0  --  0.0    Recent Labs  Lab 05/24/19 1548 05/25/19 1636 05/26/19 0500 05/27/19 0420  NA 133* 136 138 139  K 4.0 4.1 3.9 4.6  CL 101 107 110 109  CO2 23 19* 19* 23  GLUCOSE 107* 140* 113* 109*  BUN 39* 45* 36* 27*  CREATININE 2.38* 2.50* 1.82* 1.46*  CALCIUM 8.4 8.5* 8.3* 8.5*  AST 21 30  --   --   ALT 16 24  --   --   ALKPHOS 48 47  --   --   BILITOT 1.1 1.1  --   --   ALBUMIN 3.7 3.1*  --   --   MG  --   --  2.1 2.1  PROCALCITON  --   --  0.19 0.11  BNP  --   --  483.2* 84.4    Recent Labs  Lab 05/25/19 2155 05/26/19 0500 05/27/19 0420  BNP  --  483.2* 84.4  PROCALCITON  --  0.19 0.11  SARSCOV2NAA NEGATIVE  --   --     ------------------------------------------------------------------------------------------------------------------ No results for input(s): CHOL, HDL, LDLCALC, TRIG, CHOLHDL, LDLDIRECT in the last 72 hours.  Lab Results  Component Value Date   HGBA1C 5.1 10/12/2016   ------------------------------------------------------------------------------------------------------------------ No results for input(s): TSH, T4TOTAL, T3FREE, THYROIDAB in the last 72 hours.  Invalid input(s): FREET3 ------------------------------------------------------------------------------------------------------------------ No results for input(s): VITAMINB12, FOLATE, FERRITIN, TIBC, IRON, RETICCTPCT in the last 72 hours.  Coagulation profile No results for input(s): INR, PROTIME in the last 168 hours.  No results for input(s): DDIMER in the last 72 hours.  Cardiac Enzymes No results for input(s): CKMB, TROPONINI, MYOGLOBIN in the last 168 hours.  Invalid input(s):  CK ------------------------------------------------------------------------------------------------------------------    Component Value Date/Time   BNP 84.4 05/27/2019 0420    Micro Results Recent Results (from the past 240 hour(s))  CULTURE, URINE COMPREHENSIVE     Status: Abnormal   Collection Time: 05/24/19  3:48 PM   Specimen: Blood  Result Value Ref Range Status   MICRO NUMBER: [id number]  Final   SPECIMEN QUALITY: Adequate  Final   Source URINE  Final   STATUS: FINAL  Final   ISOLATE 1: Escherichia coli (A)  Final    Comment: Greater than 100,000 CFU/mL of Escherichia coli      Susceptibility   Escherichia coli - CULT, URN, SPECIAL NEGATIVE 1    AMOX/CLAVULANIC  4 Sensitive     AMPICILLIN 8 Sensitive     AMPICILLIN/SULBACTAM 4 Sensitive     CEFAZOLIN* <=4 Not Reportable      * For infections other than uncomplicated UTIcaused by E. coli, K. pneumoniae or P. mirabilis:Cefazolin is resistant if MIC > or = 8 mcg/mL.(Distinguishing susceptible versus intermediatefor isolates with MIC < or = 4 mcg/mL requiresadditional testing.)For uncomplicated UTI caused by E. coli,K. pneumoniae or P. mirabilis: Cefazolin issusceptible if MIC <32 mcg/mL and predictssusceptible to the oral agents cefaclor, cefdinir,cefpodoxime, cefprozil, cefuroxime, cephalexinand loracarbef.    CEFEPIME <=1 Sensitive     CEFTRIAXONE <=1 Sensitive     CIPROFLOXACIN >=4 Resistant     LEVOFLOXACIN >=8 Resistant     ERTAPENEM <=0.5 Sensitive     GENTAMICIN <=1 Sensitive     IMIPENEM <=0.25 Sensitive     NITROFURANTOIN <=16 Sensitive     PIP/TAZO <=4 Sensitive     TOBRAMYCIN <=1 Sensitive     TRIMETH/SULFA* <=20 Sensitive      * For infections other than uncomplicated UTIcaused by E. coli, K. pneumoniae or P. mirabilis:Cefazolin is resistant if MIC > or = 8 mcg/mL.(Distinguishing susceptible versus intermediatefor isolates with MIC < or = 4 mcg/mL requiresadditional testing.)For uncomplicated UTI caused by E. coli,K.  pneumoniae or P. mirabilis: Cefazolin issusceptible if MIC <32 mcg/mL and predictssusceptible to the oral agents cefaclor, cefdinir,cefpodoxime, cefprozil, cefuroxime, cephalexinand loracarbef.Legend:S = Susceptible  I = IntermediateR = Resistant  NS = Not susceptible* = Not tested  NR = Not reported**NN = See antimicrobic comments  Urine culture     Status: Abnormal (Preliminary result)   Collection Time: 05/25/19  9:55 PM   Specimen: Urine, Random  Result Value Ref Range Status   Specimen Description URINE, RANDOM  Final   Special Requests   Final    NONE Performed at [facility], [place] [address], [place], [place] [address]    Culture 40,000 COLONIES/mL ESCHERICHIA COLI (A)  Final   Report Status PENDING  Incomplete  SARS Coronavirus 2 by RT PCR (hospital order, performed in [facility] hospital lab) Nasopharyngeal Nasopharyngeal Swab     Status: None   Collection Time: 05/25/19  9:55 PM   Specimen: Nasopharyngeal Swab  Result Value Ref Range Status   SARS Coronavirus 2 NEGATIVE NEGATIVE Final    Comment: (NOTE) SARS-CoV-2 target nucleic acids are NOT DETECTED. The SARS-CoV-2 RNA is generally detectable in upper and lower respiratory specimens during the acute phase of infection. The lowest concentration of SARS-CoV-2 viral copies this assay can detect is 250 copies / mL. A negative result does not preclude SARS-CoV-2 infection and should not be used as the sole basis for treatment or other patient management decisions.  A negative result may occur with improper specimen collection / handling, submission of specimen other than nasopharyngeal swab, presence of viral mutation(s) within the areas targeted by this assay, and inadequate number of viral copies (<250 copies / mL). A negative result must be combined with clinical observations, patient history, and epidemiological information. Fact Sheet for Patients:   StrictlyIdeas.no Fact Sheet for  Healthcare Providers: BankingDealers.co.za This test is not yet approved or cleared  by the [place] FDA and has been authorized for detection and/or diagnosis of SARS-CoV-2 by FDA under an Emergency Use Authorization (EUA).  This EUA will remain in effect (meaning this test can be used) for the duration of the COVID-19 declaration under Section 564(b)(1) of the Act, 21 U.S.C. section 360bbb-3(b)(1), unless the authorization  is terminated or revoked sooner. Performed at [facility], [place] [address] [place], [place] [address]     Radiology Reports DG Chest Portable 1 View  Result Date: 05/25/2019 CLINICAL DATA:  Shortness of breath EXAM: PORTABLE CHEST 1 VIEW COMPARISON:  02/22/2018 FINDINGS: Cardiomegaly. No confluent airspace opacity, effusion or edema. No acute bony abnormality. IMPRESSION: Cardiomegaly.  No active disease. Electronically Signed   By: Rolm Baptise M.D.   On: 05/25/2019 18:31   CT Renal Stone Study  Result Date: 05/25/2019 CLINICAL DATA:  Flank pain, elevated white blood cell count EXAM: CT ABDOMEN AND PELVIS WITHOUT CONTRAST TECHNIQUE: Multidetector CT imaging of the abdomen and pelvis was performed following the standard protocol without IV contrast. COMPARISON:  11/24/2012 FINDINGS: Lower chest: No acute pleural or parenchymal lung disease. Hepatobiliary: No focal liver abnormality is seen. No gallstones, gallbladder wall thickening, or biliary dilatation. Pancreas: Unremarkable. No pancreatic ductal dilatation or surrounding inflammatory changes. Spleen: Normal in size without focal abnormality. Adrenals/Urinary Tract: No urinary tract calculi or obstructive uropathy within either kidney. Mildly complex right renal cyst is again identified, without significant change in size measuring 4.6 cm in diameter. There is a thin mural calcification but has developed in the interim. If further evaluation is desired, dedicated MRI could be considered  on a nonemergent outpatient basis. There is mild fat stranding surrounding the lower pole left kidney and left renal pelvis, which could reflect underlying infection. Evaluation of the parenchyma is limited without IV contrast. Bladder is grossly normal. The adrenal glands are unremarkable. Stomach/Bowel: No bowel obstruction or ileus. Normal appendix right lower quadrant. No bowel wall thickening or inflammatory change. Vascular/Lymphatic: Aortic atherosclerosis. No enlarged abdominal or pelvic lymph nodes. Reproductive: Prostate is unremarkable. Other: Small fat containing left inguinal hernia. No free fluid or free gas. Musculoskeletal: No acute or destructive bony lesions. Extensive multilevel spondylosis. Reconstructed images demonstrate no additional findings. IMPRESSION: 1. Mild fat stranding surrounding the lower pole left kidney and left renal pelvis, which could reflect underlying infection. Evaluation of the parenchyma is limited without IV contrast. 2. No urinary tract calculi or obstructive uropathy. 3. Stable mildly complex right renal cyst, with interval development of a thin mural calcification. If further evaluation is desired, dedicated MRI could be considered on a nonemergent outpatient basis. 4. Small fat containing left inguinal hernia. 5. Aortic Atherosclerosis (ICD10-I70.0). Electronically Signed   By: Randa Ngo M.D.   On: 05/25/2019 20:36    Time Spent in minutes  30   Lala Lund M.D on 05/27/2019 at 10:01 AM  To page go to [email or web address] - password [facility]

## 2019-05-28 LAB — URINE CULTURE: Culture: 40000 — AB

## 2019-05-28 MED ORDER — TAMSULOSIN HCL 0.4 MG PO CAPS
0.4000 mg | ORAL_CAPSULE | Freq: Every day | ORAL | 0 refills | Status: DC
Start: 2019-05-28 — End: 2019-06-28

## 2019-05-28 MED ORDER — CEPHALEXIN 500 MG PO CAPS: 500.0000 mg | ORAL_CAPSULE | Freq: Three times a day (TID) | ORAL | 0 refills | Status: AC

## 2019-05-28 MED ORDER — CEPHALEXIN 500 MG PO CAPS: 500.0000 mg | ORAL_CAPSULE | Freq: Three times a day (TID) | ORAL | 0 refills | Status: DC

## 2019-05-28 NOTE — Discharge Summary (Addendum)
Colin Lopez [id number] DOB: 1940-04-06 DOA: 05/25/2019  PCP: Jearld Fenton, NP  Admit date: 05/25/2019  Discharge date: 05/28/2019  Admitted From: Home  Disposition:  Home   Recommendations for Outpatient Follow-up:   Follow up with PCP in 1-2 weeks  PCP Please obtain BMP/CBC, 2 view CXR in 1week,  (see Discharge instructions)   PCP Please follow up on the following pending results: Monitor CBC, PSA and BMP, set up appointment with the urologist within 7 to 10 days.   Home Health: None  Equipment/Devices: None  Consultations: None  Discharge Condition: Stable    CODE STATUS: Full    Diet Recommendation: Heart Healthy   Diet Order            Diet - low sodium heart healthy        Diet Heart Room service appropriate? Yes; Fluid consistency: Thin  Diet effective now               Chief Complaint  Patient presents with  . Abnormal Lab     Brief history of present illness from the day of admission and additional interim summary    Colin Phibbs Huntis a 79 y.o.malewith medical history significant ofCAD s/p PCI, HTN, prior CVA, who was checked by PCP and found to have AKI and sent to the hospital where his work-up was suggestive of AKI along with UTI with the possibility of pyelonephritis and admitted to the hospital.                                                                 Hospital Course    1. UTI most likely E. coli even though urine culture not completely conclusive, could have early left-sided pyelonephritis. Overall improved on empiric Rocephin which will be continued, UC appears inconclusive , clinically responded very well to empiric Rocephin and will get 7 more days of oral Keflex upon discharge, currently completely symptom-free and back to baseline request PCP to arrange for  one-time outpatient urology follow-up as his PSA was elevated at 10.5 upon admission and could have underlying BPH causing UTI although PSA can be elevated due to acute infection as well, Flomax also added.  2.  CAD.  Chest pain-free continue combination of aspirin, plavix, statin and beta-blocker for secondary prevention.  3.  Dyslipidemia.  On statin, fenofibrate along with Zetia.  4.  GERD.  PPI.  5.  AKI on CKD 3B.  Improved after aggressive hydration with IV fluids.  PCP to repeat BMP in a week post discharge.  Creatinine close to baseline of 1.45.   Discharge diagnosis     Principal Problem:   Acute pyelonephritis Active Problems:   Essential hypertension   AKI (acute kidney injury) ([place])   Acute lower UTI   Elevated PSA  Discharge instructions    Discharge Instructions    Diet - low sodium heart healthy   Complete by: As directed    Discharge instructions   Complete by: As directed    Follow with Primary MD Jearld Fenton, NP in 7 days   Get CBC, CMP   checked next visit within 1 week by Primary   Activity: As tolerated with Full fall precautions use walker/cane & assistance as needed  Disposition Home   Diet: Heart Healthy                  Special Instructions: If you have smoked or chewed Tobacco  in the last 2 yrs please stop smoking, stop any regular Alcohol  and or any Recreational drug use.  On your next visit with your primary care physician please Get Medicines reviewed and adjusted.  Please request your Prim.MD to go over all Hospital Tests and Procedure/Radiological results at the follow up, please get all Hospital records sent to your Prim MD by signing hospital release before you go home.  If you experience worsening of your admission symptoms, develop shortness of breath, life threatening emergency, suicidal or homicidal thoughts you must seek medical attention immediately by calling 911 or calling your MD immediately  if symptoms less  severe.  You Must read complete instructions/literature along with all the possible adverse reactions/side effects for all the Medicines you take and that have been prescribed to you. Take any new Medicines after you have completely understood and accpet all the possible adverse reactions/side effects.   Increase activity slowly   Complete by: As directed       Discharge Medications   Allergies as of 05/28/2019      Reactions   Penicillins Hives   Has patient had a PCN reaction causing immediate rash, facial/tongue/throat swelling, SOB or lightheadedness with hypotension: No Has patient had a PCN reaction causing severe rash involving mucus membranes or skin necrosis: Yes Has patient had a PCN reaction that required hospitalization: No Has patient had a PCN reaction occurring within the last 10 years: No If all of the above answers are "NO", then may proceed with Cephalosporin use.   Doxycycline Rash   Water blisters   Levaquin [levofloxacin In D5w] Rash      Medication List    STOP taking these medications   ciprofloxacin 500 MG tablet Commonly known as: CIPRO     TAKE these medications   acetaminophen 500 MG tablet Commonly known as: TYLENOL Take 1,000 mg by mouth every 6 (six) hours as needed for headache (pain).   aspirin EC 81 MG tablet Take 1 tablet (81 mg total) by mouth daily.   atorvastatin 80 MG tablet Commonly known as: LIPITOR Take 1 tablet (80 mg total) by mouth daily.   cephALEXin 500 MG capsule Commonly known as: KEFLEX Take 1 capsule (500 mg total) by mouth 3 (three) times daily for 7 days.   clopidogrel 75 MG tablet Commonly known as: PLAVIX Take 1 tablet by mouth once daily with breakfast   ezetimibe 10 MG tablet Commonly known as: ZETIA Take 1 tablet (10 mg total) by mouth daily.   fenofibrate 160 MG tablet Take 1 tablet (160 mg total) by mouth daily.   Krill Oil 1000 MG Caps Take 1,000 mg by mouth at bedtime.   nebivolol 10 MG  tablet Commonly known as: Bystolic Take 1 tablet (10 mg total) by mouth daily.   nitroGLYCERIN 0.4 MG SL tablet Commonly known  as: Nitrostat Place 1 tablet (0.4 mg total) under the tongue every 5 (five) minutes as needed for chest pain.   Omega-3 1000 MG Caps Take 4,000 mg by mouth daily.   ondansetron 4 MG tablet Commonly known as: ZOFRAN Take 1 tablet (4 mg total) by mouth every 8 (eight) hours as needed.   OVER THE COUNTER MEDICATION Apply 1 application topically daily as needed (pain). Horse Liniment otc cream   pantoprazole 40 MG tablet Commonly known as: PROTONIX TAKE 1 TABLET BY MOUTH EVERY DAY NEEDS APPT FOR REFILLS   tamsulosin 0.4 MG Caps capsule Commonly known as: Flomax Take 1 capsule (0.4 mg total) by mouth daily after supper.   telmisartan 80 MG tablet Commonly known as: MICARDIS Take 1 tablet (80 mg total) by mouth daily.       Follow-up Information    Jearld Fenton, NP. Schedule an appointment as soon as possible for a visit in 1 week(s).   Specialties: Internal Medicine, Emergency Medicine Why: You must see a urologist in 7 to 10 days.  Get your PSA rechecked and prostate evaluated. Contact information: [place] [address] [phone]        Alexis Frock, MD. Schedule an appointment as soon as possible for a visit in 1 week(s).   Specialty: Urology Contact information: [place] [address] [phone]           Major procedures and Radiology Reports - PLEASE review detailed and final reports thoroughly  -       DG Chest Portable 1 View  Result Date: 05/25/2019 CLINICAL DATA:  Shortness of breath EXAM: PORTABLE CHEST 1 VIEW COMPARISON:  02/22/2018 FINDINGS: Cardiomegaly. No confluent airspace opacity, effusion or edema. No acute bony abnormality. IMPRESSION: Cardiomegaly.  No active disease. Electronically Signed   By: Rolm Baptise M.D.   On: 05/25/2019 18:31   CT Renal Stone Study  Result  Date: 05/25/2019 CLINICAL DATA:  Flank pain, elevated white blood cell count EXAM: CT ABDOMEN AND PELVIS WITHOUT CONTRAST TECHNIQUE: Multidetector CT imaging of the abdomen and pelvis was performed following the standard protocol without IV contrast. COMPARISON:  11/24/2012 FINDINGS: Lower chest: No acute pleural or parenchymal lung disease. Hepatobiliary: No focal liver abnormality is seen. No gallstones, gallbladder wall thickening, or biliary dilatation. Pancreas: Unremarkable. No pancreatic ductal dilatation or surrounding inflammatory changes. Spleen: Normal in size without focal abnormality. Adrenals/Urinary Tract: No urinary tract calculi or obstructive uropathy within either kidney. Mildly complex right renal cyst is again identified, without significant change in size measuring 4.6 cm in diameter. There is a thin mural calcification but has developed in the interim. If further evaluation is desired, dedicated MRI could be considered on a nonemergent outpatient basis. There is mild fat stranding surrounding the lower pole left kidney and left renal pelvis, which could reflect underlying infection. Evaluation of the parenchyma is limited without IV contrast. Bladder is grossly normal. The adrenal glands are unremarkable. Stomach/Bowel: No bowel obstruction or ileus. Normal appendix right lower quadrant. No bowel wall thickening or inflammatory change. Vascular/Lymphatic: Aortic atherosclerosis. No enlarged abdominal or pelvic lymph nodes. Reproductive: Prostate is unremarkable. Other: Small fat containing left inguinal hernia. No free fluid or free gas. Musculoskeletal: No acute or destructive bony lesions. Extensive multilevel spondylosis. Reconstructed images demonstrate no additional findings. IMPRESSION: 1. Mild fat stranding surrounding the lower pole left kidney and left renal pelvis, which could reflect underlying infection. Evaluation of the parenchyma is limited without IV  contrast. 2. No urinary  tract calculi or obstructive uropathy. 3. Stable mildly complex right renal cyst, with interval development of a thin mural calcification. If further evaluation is desired, dedicated MRI could be considered on a nonemergent outpatient basis. 4. Small fat containing left inguinal hernia. 5. Aortic Atherosclerosis (ICD10-I70.0). Electronically Signed   By: Randa Ngo M.D.   On: 05/25/2019 20:36    Micro Results     Recent Results (from the past 240 hour(s))  CULTURE, URINE COMPREHENSIVE     Status: Abnormal   Collection Time: 05/24/19  3:48 PM   Specimen: Blood  Result Value Ref Range Status   MICRO NUMBER: [id number]  Final   SPECIMEN QUALITY: Adequate  Final   Source URINE  Final   STATUS: FINAL  Final   ISOLATE 1: Escherichia coli (A)  Final    Comment: Greater than 100,000 CFU/mL of Escherichia coli      Susceptibility   Escherichia coli - CULT, URN, SPECIAL NEGATIVE 1    AMOX/CLAVULANIC 4 Sensitive     AMPICILLIN 8 Sensitive     AMPICILLIN/SULBACTAM 4 Sensitive     CEFAZOLIN* <=4 Not Reportable      * For infections other than uncomplicated UTIcaused by E. coli, K. pneumoniae or P. mirabilis:Cefazolin is resistant if MIC > or = 8 mcg/mL.(Distinguishing susceptible versus intermediatefor isolates with MIC < or = 4 mcg/mL requiresadditional testing.)For uncomplicated UTI caused by E. coli,K. pneumoniae or P. mirabilis: Cefazolin issusceptible if MIC <32 mcg/mL and predictssusceptible to the oral agents cefaclor, cefdinir,cefpodoxime, cefprozil, cefuroxime, cephalexinand loracarbef.    CEFEPIME <=1 Sensitive     CEFTRIAXONE <=1 Sensitive     CIPROFLOXACIN >=4 Resistant     LEVOFLOXACIN >=8 Resistant     ERTAPENEM <=0.5 Sensitive     GENTAMICIN <=1 Sensitive     IMIPENEM <=0.25 Sensitive     NITROFURANTOIN <=16 Sensitive     PIP/TAZO <=4 Sensitive     TOBRAMYCIN <=1 Sensitive     TRIMETH/SULFA* <=20 Sensitive      * For infections other than uncomplicated UTIcaused by E. coli, K.  pneumoniae or P. mirabilis:Cefazolin is resistant if MIC > or = 8 mcg/mL.(Distinguishing susceptible versus intermediatefor isolates with MIC < or = 4 mcg/mL requiresadditional testing.)For uncomplicated UTI caused by E. coli,K. pneumoniae or P. mirabilis: Cefazolin issusceptible if MIC <32 mcg/mL and predictssusceptible to the oral agents cefaclor, cefdinir,cefpodoxime, cefprozil, cefuroxime, cephalexinand loracarbef.Legend:S = Susceptible  I = IntermediateR = Resistant  NS = Not susceptible* = Not tested  NR = Not reported**NN = See antimicrobic comments  Urine culture     Status: Abnormal   Collection Time: 05/25/19  9:55 PM   Specimen: Urine, Random  Result Value Ref Range Status   Specimen Description URINE, RANDOM  Final   Special Requests   Final    NONE Performed at [facility], [place] [address] [place], [place] [address]    Culture 40,000 COLONIES/mL ESCHERICHIA COLI (A)  Final   Report Status 05/28/2019 FINAL  Final   Organism ID, Bacteria ESCHERICHIA COLI (A)  Final      Susceptibility   Escherichia coli - MIC*    AMPICILLIN 8 SENSITIVE Sensitive     CEFAZOLIN <=4 SENSITIVE Sensitive     CEFTRIAXONE <=1 SENSITIVE Sensitive     CIPROFLOXACIN >=4 RESISTANT Resistant     GENTAMICIN <=1 SENSITIVE Sensitive     IMIPENEM <=0.25 SENSITIVE Sensitive     NITROFURANTOIN <=16 SENSITIVE Sensitive  TRIMETH/SULFA <=20 SENSITIVE Sensitive     AMPICILLIN/SULBACTAM 4 SENSITIVE Sensitive     PIP/TAZO <=4 SENSITIVE Sensitive     * 40,000 COLONIES/mL ESCHERICHIA COLI  SARS Coronavirus 2 by RT PCR (hospital order, performed in [facility] hospital lab) Nasopharyngeal Nasopharyngeal Swab     Status: None   Collection Time: 05/25/19  9:55 PM   Specimen: Nasopharyngeal Swab  Result Value Ref Range Status   SARS Coronavirus 2 NEGATIVE NEGATIVE Final    Comment: (NOTE) SARS-CoV-2 target nucleic acids are NOT DETECTED. The SARS-CoV-2 RNA is generally detectable in upper and  lower respiratory specimens during the acute phase of infection. The lowest concentration of SARS-CoV-2 viral copies this assay can detect is 250 copies / mL. A negative result does not preclude SARS-CoV-2 infection and should not be used as the sole basis for treatment or other patient management decisions.  A negative result may occur with improper specimen collection / handling, submission of specimen other than nasopharyngeal swab, presence of viral mutation(s) within the areas targeted by this assay, and inadequate number of viral copies (<250 copies / mL). A negative result must be combined with clinical observations, patient history, and epidemiological information. Fact Sheet for Patients:   StrictlyIdeas.no Fact Sheet for Healthcare Providers: BankingDealers.co.za This test is not yet approved or cleared  by the [place] FDA and has been authorized for detection and/or diagnosis of SARS-CoV-2 by FDA under an Emergency Use Authorization (EUA).  This EUA will remain in effect (meaning this test can be used) for the duration of the COVID-19 declaration under Section 564(b)(1) of the Act, 21 U.S.C. section 360bbb-3(b)(1), unless the authorization is terminated or revoked sooner. Performed at [facility], [place] [address], [place], [place] [address]     Today   Subjective    Alison Glas today has no headache,no chest abdominal pain,no new weakness tingling or numbness, feels much better wants to go home today.    Objective   Blood pressure 131/74, pulse 66, temperature 98.4 F (36.9 C), temperature source Oral, resp. rate 18, height 6' (1.829 m), weight 94.5 kg, SpO2 98 %.   Intake/Output Summary (Last 24 hours) at 05/28/2019 1008 Last data filed at 05/28/2019 0900 Gross per 24 hour  Intake 1598.59 ml  Output 2620 ml  Net -1021.41 ml    Exam  Awake Alert, No new F.N deficits, Normal  affect .AT,PERRAL Supple Neck,No JVD, No cervical lymphadenopathy appriciated.  Symmetrical Chest wall movement, Good air movement bilaterally, CTAB RRR,No Gallops,Rubs or new Murmurs, No Parasternal Heave +ve B.Sounds, Abd Soft, Non tender, No organomegaly appriciated, No rebound -guarding or rigidity. No Cyanosis, Clubbing or edema, No new Rash or bruise   Data Review   CBC w Diff:  Lab Results  Component Value Date   WBC 5.0 05/27/2019   HGB 10.6 (L) 05/27/2019   HGB 14.3 09/08/2016   HCT 33.6 (L) 05/27/2019   HCT 42.3 09/08/2016   PLT 125 (L) 05/27/2019   PLT 140 (L) 09/08/2016   LYMPHOPCT 22 05/27/2019   MONOPCT 11 05/27/2019   EOSPCT 2 05/27/2019   BASOPCT 1 05/27/2019    CMP:  Lab Results  Component Value Date   NA 139 05/27/2019   NA 140 12/29/2018   NA 138 11/24/2012   K 4.6 05/27/2019   K 3.8 11/24/2012   CL 109 05/27/2019   CL 106 11/24/2012   CO2 23 05/27/2019   CO2 28 11/24/2012   BUN 27 (H) 05/27/2019  BUN 32 (H) 12/29/2018   BUN 20 (H) 11/24/2012   CREATININE 1.46 (H) 05/27/2019   CREATININE 0.95 11/24/2012   PROT 6.2 (L) 05/25/2019   PROT 6.2 03/30/2019   PROT 6.9 11/24/2012   ALBUMIN 3.1 (L) 05/25/2019   ALBUMIN 4.0 03/30/2019   ALBUMIN 3.7 11/24/2012   BILITOT 1.1 05/25/2019   BILITOT 0.5 03/30/2019   BILITOT 0.4 11/24/2012   ALKPHOS 47 05/25/2019   ALKPHOS 102 11/24/2012   AST 30 05/25/2019   AST 25 11/24/2012   ALT 24 05/25/2019   ALT 35 11/24/2012  .   Total Time in preparing paper work, data evaluation and todays exam - 60 minutes  Lala Lund M.D on 05/28/2019 at 10:08 AM  [facility]   Office  [phone]

## 2019-05-28 NOTE — Discharge Instructions (Signed)
Follow with Primary MD Jearld Fenton, NP in 7 days   Get CBC, CMP checked next visit within 1 week by Primary MD    Activity: As tolerated with Full fall precautions use walker/cane & assistance as needed  Disposition Home   Diet: Heart Healthy    Special Instructions: If you have smoked or chewed Tobacco  in the last 2 yrs please stop smoking, stop any regular Alcohol  and or any Recreational drug use.  On your next visit with your primary care physician please Get Medicines reviewed and adjusted.  Please request your Prim.MD to go over all Hospital Tests and Procedure/Radiological results at the follow up, please get all Hospital records sent to your Prim MD by signing hospital release before you go home.  If you experience worsening of your admission symptoms, develop shortness of breath, life threatening emergency, suicidal or homicidal thoughts you must seek medical attention immediately by calling 911 or calling your MD immediately  if symptoms less severe.  You Must read complete instructions/literature along with all the possible adverse reactions/side effects for all the Medicines you take and that have been prescribed to you. Take any new Medicines after you have completely understood and accpet all the possible adverse reactions/side effects.

## 2019-05-28 NOTE — Plan of Care (Signed)
Goals met

## 2019-05-29 ENCOUNTER — Telehealth: Payer: Self-pay

## 2019-05-29 NOTE — Telephone Encounter (Signed)
2nd/final attempt- Left HIPAA complaint voicemail asking patient to return call and schedule hospital follow up visit.

## 2019-05-29 NOTE — Telephone Encounter (Signed)
noted 

## 2019-05-29 NOTE — Telephone Encounter (Signed)
1st attempt- Left HIPAA complaint voicemail to return my call- need to complete TCM and schedule hospital follow up visit.

## 2019-06-01 ENCOUNTER — Encounter: Payer: Self-pay | Admitting: Internal Medicine

## 2019-06-01 ENCOUNTER — Other Ambulatory Visit: Payer: Self-pay

## 2019-06-01 ENCOUNTER — Ambulatory Visit (INDEPENDENT_AMBULATORY_CARE_PROVIDER_SITE_OTHER): Payer: Medicare Other | Admitting: Internal Medicine

## 2019-06-01 VITALS — BP 124/72 | HR 64 | Temp 98.0°F | Wt 212.0 lb

## 2019-06-01 DIAGNOSIS — N410 Acute prostatitis: Secondary | ICD-10-CM

## 2019-06-01 DIAGNOSIS — D72829 Elevated white blood cell count, unspecified: Secondary | ICD-10-CM | POA: Diagnosis not present

## 2019-06-01 DIAGNOSIS — N10 Acute tubulo-interstitial nephritis: Secondary | ICD-10-CM | POA: Diagnosis not present

## 2019-06-01 DIAGNOSIS — R972 Elevated prostate specific antigen [PSA]: Secondary | ICD-10-CM

## 2019-06-01 NOTE — Progress Notes (Signed)
Subjective:    Patient ID: Colin Lopez, male    DOB: 1940/03/28, 79 y.o.   MRN: [id number]  HPI  Patient presents today for TCM hospital follow-up.  He was sent to the ER 5/13 by this provider with concerns for leukosis, possible urosepsis/acute prostatitis, acute kidney injury.  He was treated with IV fluids, IV Rocephin, discharged on oral Keflex.  He was started on Flomax as well.  He was discharged 5/16.  Since discharge, he reports he is still taking the Keflex and Flomax. He reports he does not have a follow up with urology until the end of June.  Review of Systems      Past Medical History:  Diagnosis Date  . CAD (coronary artery disease), native coronary artery    09/09/16 PCI/DES x3 to mRCA, OM1 and dLcx/OM2.   . Coronary disease    Status post stenting of the left circumflex coronary in 2009 with a bare-metal stent (with a 3.5x50m Liberte stent)  . Dyslipidemia   . 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 Hyperlipidemia   . Hypertension   . NSTEMI (non-ST elevated myocardial infarction) ([place] 2009   BMS CFX  . [place]   . TIA (transient ischemic attack)    history of tia    Current Outpatient Medications  Medication Sig Dispense Refill  . acetaminophen (TYLENOL) 500 MG tablet Take 1,000 mg by mouth every 6 (six) hours as needed for headache (pain).    .Marland Kitchenaspirin EC 81 MG tablet Take 1 tablet (81 mg total) by mouth daily. 90 tablet 3  . atorvastatin (LIPITOR) 80 MG tablet Take 1 tablet (80 mg total) by mouth daily. 90 tablet 3  . cephALEXin (KEFLEX) 500 MG capsule Take 1 capsule (500 mg total) by mouth 3 (three) times daily for 7 days. 21 capsule 0  . clopidogrel (PLAVIX) 75 MG tablet Take 1 tablet by mouth once daily with breakfast 90 tablet 3  . ezetimibe (ZETIA) 10 MG tablet Take 1 tablet (10 mg total) by mouth daily. 90 tablet 3  . fenofibrate 160 MG tablet Take 1 tablet (160 mg total) by mouth daily. 90 tablet 3  . Krill Oil 1000 MG CAPS Take 1,000 mg by mouth at bedtime.    .  nebivolol (BYSTOLIC) 10 MG tablet Take 1 tablet (10 mg total) by mouth daily. 90 tablet 3  . nitroGLYCERIN (NITROSTAT) 0.4 MG SL tablet Place 1 tablet (0.4 mg total) under the tongue every 5 (five) minutes as needed for chest pain. 25 tablet 3  . Omega-3 1000 MG CAPS Take 4,000 mg by mouth daily.    . ondansetron (ZOFRAN) 4 MG tablet Take 1 tablet (4 mg total) by mouth every 8 (eight) hours as needed. 20 tablet 0  . OVER THE COUNTER MEDICATION Apply 1 application topically daily as needed (pain). Horse Liniment otc cream    . pantoprazole (PROTONIX) 40 MG tablet TAKE 1 TABLET BY MOUTH EVERY DAY NEEDS APPT FOR REFILLS 60 tablet 0  . tamsulosin (FLOMAX) 0.4 MG CAPS capsule Take 1 capsule (0.4 mg total) by mouth daily after supper. 30 capsule 0  . telmisartan (MICARDIS) 80 MG tablet Take 1 tablet (80 mg total) by mouth daily. 90 tablet 3   No current facility-administered medications for this visit.    Allergies  Allergen Reactions  . Penicillins Hives    Has patient had a PCN reaction causing immediate rash, facial/tongue/throat swelling, SOB or lightheadedness with hypotension: No Has patient had a  PCN reaction causing severe rash involving mucus membranes or skin necrosis: Yes Has patient had a PCN reaction that required hospitalization: No Has patient had a PCN reaction occurring within the last 10 years: No If all of the above answers are "NO", then may proceed with Cephalosporin use. Ceftriaxone Ok  . Doxycycline Rash    Water blisters  . Levaquin [Levofloxacin In D5w] Rash    Family History  Problem Relation Age of Onset  . Diabetes Mother   . CAD Sister 51       MI, obese  . Cancer Brother        stomach    Social History   Socioeconomic History  . Marital status: Married    Spouse name: [name]  . Number of children: 6  . Years of education: 81  . Highest education level: 9th grade  Occupational History  . Occupation: Archivist  Tobacco Use  . Smoking status: Never  Smoker  . Smokeless tobacco: Never Used  Substance and Sexual Activity  . Alcohol use: Yes    Comment: Rarely   . Drug use: No  . Sexual activity: Not Currently  Other Topics Concern  . Not on file  Social History Narrative  . Not on file   Social Determinants of Health   Financial Resource Strain:   . Difficulty of Paying Living Expenses:   Food Insecurity:   . Worried About Charity fundraiser in the Last Year:   . Arboriculturist in the Last Year:   Transportation Needs:   . Film/video editor (Medical):   [name] Lack of Transportation (Non-Medical):   Physical Activity:   . Days of Exercise per Week:   . Minutes of Exercise per Session:   Stress:   . Feeling of Stress :   Social Connections:   . Frequency of Communication with Friends and Family:   . Frequency of Social Gatherings with Friends and Family:   . Attends Religious Services:   . Active Member of Clubs or Organizations:   . Attends Archivist Meetings:   [name] Marital Status:   Intimate Partner Violence:   . Fear of Current or Ex-Partner:   . Emotionally Abused:   [name] Physically Abused:   . Sexually Abused:      Constitutional: Denies fever, malaise, fatigue, headache or abrupt weight changes.  Respiratory: Denies difficulty breathing, shortness of breath, cough or sputum production.   Cardiovascular: Denies chest pain, chest tightness, palpitations or swelling in the hands or feet.  Gastrointestinal: Denies abdominal pain, bloating, constipation, diarrhea or blood in the stool.  GU: Pt reports weak stream. Denies urgency, frequency, pain with urination, burning sensation, blood in urine, odor or discharge.  No other specific complaints in a complete review of systems (except as listed in HPI above).  Objective:   Physical Exam  BP 124/72   Pulse 64   Temp 98 F (36.7 C) (Temporal)   Wt 212 lb (96.2 kg)   SpO2 97%   BMI 28.75 kg/m   Wt Readings from Last 3 Encounters:  05/27/19 208 lb  5.4 oz (94.5 kg)  05/24/19 210 lb 6.4 oz (95.4 kg)  02/16/19 209 lb (94.8 kg)    General: Appears his stated age, obese, in NAD. Skin: Bruising noted to left antecubital space. Cardiovascular: Normal rate and rhythm. S1,S2 noted.  No murmur, rubs or gallops noted.  Pulmonary/Chest: Normal effort and positive vesicular breath sounds. No respiratory distress. No wheezes, rales or  ronchi noted.  Abdomen: Soft and nontender. Normal bowel sounds. No distention or masses noted. No CVA tenderness noted.  Neurological: Alert and oriented.    BMET    Component Value Date/Time   NA 139 05/27/2019 0420   NA 140 12/29/2018 1553   NA 138 11/24/2012 1910   K 4.6 05/27/2019 0420   K 3.8 11/24/2012 1910   CL 109 05/27/2019 0420   CL 106 11/24/2012 1910   CO2 23 05/27/2019 0420   CO2 28 11/24/2012 1910   GLUCOSE 109 (H) 05/27/2019 0420   GLUCOSE 119 (H) 11/24/2012 1910   BUN 27 (H) 05/27/2019 0420   BUN 32 (H) 12/29/2018 1553   BUN 20 (H) 11/24/2012 1910   CREATININE 1.46 (H) 05/27/2019 0420   CREATININE 0.95 11/24/2012 1910   CALCIUM 8.5 (L) 05/27/2019 0420   CALCIUM 8.8 11/24/2012 1910   GFRNONAA 45 (L) 05/27/2019 0420   GFRNONAA >60 11/24/2012 1910   GFRAA 52 (L) 05/27/2019 0420   GFRAA >60 11/24/2012 1910    Lipid Panel     Component Value Date/Time   CHOL 156 03/30/2019 0813   TRIG 253 (H) 03/30/2019 0813   HDL 21 (L) 03/30/2019 0813   CHOLHDL 7.4 (H) 03/30/2019 0813   CHOLHDL 5 11/13/2016 0838   VLDL 39.0 11/13/2016 0838   LDLCALC 92 03/30/2019 0813    CBC    Component Value Date/Time   WBC 5.0 05/27/2019 0420   RBC 3.53 (L) 05/27/2019 0420   HGB 10.6 (L) 05/27/2019 0420   HGB 14.3 09/08/2016 0908   HCT 33.6 (L) 05/27/2019 0420   HCT 42.3 09/08/2016 0908   PLT 125 (L) 05/27/2019 0420   PLT 140 (L) 09/08/2016 0908   MCV 95.2 05/27/2019 0420   MCV 89 09/08/2016 0908   MCV 87 11/24/2012 1910   MCH 30.0 05/27/2019 0420   MCHC 31.5 05/27/2019 0420   RDW 14.6  05/27/2019 0420   RDW 15.4 09/08/2016 0908   RDW 14.8 (H) 11/24/2012 1910   LYMPHSABS 1.1 05/27/2019 0420   LYMPHSABS 1.5 09/08/2016 0908   MONOABS 0.6 05/27/2019 0420   EOSABS 0.1 05/27/2019 0420   EOSABS 0.1 09/08/2016 0908   BASOSABS 0.0 05/27/2019 0420   BASOSABS 0.0 09/08/2016 0908    Hgb A1C Lab Results  Component Value Date   HGBA1C 5.1 10/12/2016           Assessment & Plan:   TCM hospital follow-up for Acute Kidney Failure, Acute Pyelonephritis, Acute Prostatitis:  ER notes, labs and imaging reviewed Repeat CBC, be met and PSA today He will follow up with urology as an outpatient Continue Flomax for now Finish Keflex course  We will follow-up after labs, return precautions discussed. Webb Silversmith, NP This visit occurred during the SARS-CoV-2 public health emergency.  Safety protocols were in place, including screening questions prior to the visit, additional usage of staff PPE, and extensive cleaning of exam room while observing appropriate contact time as indicated for disinfecting solutions.

## 2019-06-01 NOTE — Patient Instructions (Signed)
Pyelonephritis, Adult  Pyelonephritis is an infection that occurs in the kidney. The kidneys are organs that help clean the blood by moving waste out of the blood and into the pee (urine). This infection can happen quickly, or it can last for a long time. In most cases, it clears up with treatment and does not cause other problems. What are the causes? This condition may be caused by:  Germs (bacteria) going from the bladder up to the kidney. This may happen after having a bladder infection.  Germs going from the blood to the kidney. What increases the risk? This condition is more likely to develop in:  Pregnant women.  Older people.  People who have any of these conditions: ? Diabetes. ? Inflammation of the prostate gland (prostatitis), in males. ? Kidney stones or bladder stones. ? Other problems with the kidney or the parts of your body that carry pee from the kidneys to the bladder (ureters). ? Cancer.  People who have a small, thin tube (catheter) placed in the bladder.  People who are sexually active.  Women who use a medicine that kills sperm (spermicide) to prevent pregnancy.  People who have had a prior urinary tract infection (UTI). What are the signs or symptoms? Symptoms of this condition include:  Peeing often.  A strong urge to pee right away.  Burning or stinging when peeing.  Belly pain.  Back pain.  Pain in the side (flank area).  Fever or chills.  Blood in the pee, or dark pee.  Feeling sick to your stomach (nauseous) or throwing up (vomiting). How is this treated? This condition may be treated by:  Taking antibiotic medicines by mouth (orally).  Drinking enough fluids. If the infection is bad, you may need to stay in the hospital. You may be given antibiotics and fluids that are put directly into a vein through an IV tube. In some cases, other treatments may be needed. Follow these instructions at home: Medicines  Take your antibiotic  medicine as told by your doctor. Do not stop taking the antibiotic even if you start to feel better.  Take over-the-counter and prescription medicines only as told by your doctor. General instructions   Drink enough fluid to keep your pee pale yellow.  Avoid caffeine, tea, and carbonated drinks.  Pee (urinate) often. Avoid holding in pee for long periods of time.  Pee before and after sex.  After pooping (having a bowel movement), women should wipe from front to back. Use each tissue only once.  Keep all follow-up visits as told by your doctor. This is important. Contact a doctor if:  You do not feel better after 2 days.  Your symptoms get worse.  You have a fever. Get help right away if:  You cannot take your medicine or drink fluids as told.  You have chills and shaking.  You throw up.  You have very bad pain in your side or back.  You feel very weak or you pass out (faint). Summary  Pyelonephritis is an infection that occurs in the kidney.  In most cases, this infection clears up with treatment and does not cause other problems.  Take your antibiotic medicine as told by your doctor. Do not stop taking the antibiotic even if you start to feel better.  Drink enough fluid to keep your pee pale yellow. This information is not intended to replace advice given to you by your health care provider. Make sure you discuss any questions you have with  your health care provider. Document Revised: 11/02/2017 Document Reviewed: 11/02/2017 Elsevier Patient Education  2020 Elsevier Inc.  

## 2019-06-02 LAB — PSA, MEDICARE: PSA: 2.05 ng/ml (ref 0.10–4.00)

## 2019-06-02 LAB — CBC
HCT: 34.5 % — ABNORMAL LOW (ref 39.0–52.0)
Hemoglobin: 11.6 g/dL — ABNORMAL LOW (ref 13.0–17.0)
MCHC: 33.6 g/dL (ref 30.0–36.0)
MCV: 91.6 fl (ref 78.0–100.0)
Platelets: 167 10*3/uL (ref 150.0–400.0)
RBC: 3.77 Mil/uL — ABNORMAL LOW (ref 4.22–5.81)
RDW: 14.9 % (ref 11.5–15.5)
WBC: 8 10*3/uL (ref 4.0–10.5)

## 2019-06-02 LAB — BASIC METABOLIC PANEL
BUN: 36 mg/dL — ABNORMAL HIGH (ref 6–23)
CO2: 29 mEq/L (ref 19–32)
Calcium: 9.3 mg/dL (ref 8.4–10.5)
Chloride: 102 mEq/L (ref 96–112)
Creatinine, Ser: 1.64 mg/dL — ABNORMAL HIGH (ref 0.40–1.50)
GFR: 40.67 mL/min — ABNORMAL LOW (ref >60.00)
Glucose, Bld: 89 mg/dL (ref 70–99)
Potassium: 5 mEq/L (ref 3.5–5.1)
Sodium: 136 mEq/L (ref 135–145)

## 2019-06-19 ENCOUNTER — Other Ambulatory Visit: Payer: Self-pay | Admitting: Cardiology

## 2019-06-26 ENCOUNTER — Other Ambulatory Visit: Payer: Self-pay | Admitting: Internal Medicine

## 2019-06-28 NOTE — Telephone Encounter (Signed)
This was prescribed by ED physician, does pt need to continue? Please advise

## 2019-07-06 DIAGNOSIS — N281 Cyst of kidney, acquired: Secondary | ICD-10-CM | POA: Diagnosis not present

## 2019-07-06 DIAGNOSIS — R3915 Urgency of urination: Secondary | ICD-10-CM | POA: Diagnosis not present

## 2019-07-06 DIAGNOSIS — N3000 Acute cystitis without hematuria: Secondary | ICD-10-CM | POA: Diagnosis not present

## 2019-07-06 DIAGNOSIS — N183 Chronic kidney disease, stage 3 unspecified: Secondary | ICD-10-CM | POA: Diagnosis not present

## 2019-09-16 ENCOUNTER — Other Ambulatory Visit: Payer: Self-pay | Admitting: Cardiology

## 2019-10-03 DIAGNOSIS — N3000 Acute cystitis without hematuria: Secondary | ICD-10-CM | POA: Diagnosis not present

## 2019-10-03 DIAGNOSIS — R3915 Urgency of urination: Secondary | ICD-10-CM | POA: Diagnosis not present

## 2019-10-03 DIAGNOSIS — N281 Cyst of kidney, acquired: Secondary | ICD-10-CM | POA: Diagnosis not present

## 2019-11-23 ENCOUNTER — Telehealth: Payer: Self-pay | Admitting: *Deleted

## 2019-11-23 NOTE — Telephone Encounter (Signed)
A message was left, re: his follow up visit. 

## 2020-01-08 ENCOUNTER — Telehealth: Payer: Self-pay

## 2020-01-08 ENCOUNTER — Encounter (HOSPITAL_COMMUNITY): Payer: Self-pay | Admitting: Emergency Medicine

## 2020-01-08 ENCOUNTER — Other Ambulatory Visit: Payer: Self-pay

## 2020-01-08 ENCOUNTER — Ambulatory Visit: Payer: Medicare Other | Admitting: Family Medicine

## 2020-01-08 ENCOUNTER — Ambulatory Visit (HOSPITAL_COMMUNITY)
Admission: EM | Admit: 2020-01-08 | Discharge: 2020-01-08 | Disposition: A | Discharge status: Home / Self Care | Payer: Medicare Other | Attending: Student | Admitting: Student

## 2020-01-08 DIAGNOSIS — Z23 Encounter for immunization: Secondary | ICD-10-CM

## 2020-01-08 DIAGNOSIS — S40811A Abrasion of right upper arm, initial encounter: Secondary | ICD-10-CM

## 2020-01-08 HISTORY — DX: Urinary tract infection, site not specified: N39.0

## 2020-01-08 MED ORDER — TETANUS-DIPHTH-ACELL PERTUSSIS 5-2.5-18.5 LF-MCG/0.5 IM SUSY
0.5000 mL | PREFILLED_SYRINGE | Freq: Once | INTRAMUSCULAR | Status: AC
  Administered 2020-01-08: 0.5 mL via INTRAMUSCULAR

## 2020-01-08 MED ORDER — TETANUS-DIPHTH-ACELL PERTUSSIS 5-2.5-18.5 LF-MCG/0.5 IM SUSY
PREFILLED_SYRINGE | INTRAMUSCULAR | Status: AC
  Filled 2020-01-08: qty 0.5

## 2020-01-08 NOTE — Telephone Encounter (Signed)
I  was going to call and triage pt after Lupita Leash CMA brought to my attention that pt had cut from chainsaw;but line is staying busy and no answer on cell.I spoke with pts wife (DPR signed) pt wife gave phone to pt; pt said was cut with chainsaw on 01/07/20 early evening while cutting a tree the chainsaw kicked back and cut 4" cut to arm;  Pt said could lay his finger in the gash and the area is still bleeding today. Not sure last tetanus shot. Advised pt would need to go to ED or UC; pt gave wife the phone and she said they are not going to sit in ED for 2 days; pt's wife will take pt to an UC in [place]; pts wife understood and will take pt now to UC. FYI to Pamala Hurry NP as PCP and Dr Patsy Lager who the appt was scheduled with but is now cancelled.

## 2020-01-08 NOTE — ED Triage Notes (Signed)
Pt cut right forearm yesterday at 4pm when a chainsaw kicked back and struck his arm. On blood thinners, bleeding controlled. Last tetanus unknown.

## 2020-01-08 NOTE — Telephone Encounter (Signed)
This sounds like a big injury if he can put his finger in the gash.  My recommendation was for ER evaluation, but he can always decline.

## 2020-01-08 NOTE — Telephone Encounter (Signed)
noted 

## 2020-01-08 NOTE — ED Provider Notes (Signed)
[facility]    CSN: [id number] Arrival date & time: 01/08/20  1138      History   Chief Complaint Chief Complaint  Patient presents with  . Laceration    HPI Colin Lopez is a 79 y.o. male presenting with abrasion. History of CAD, dyslipidemia, hypertension, NSTEMI, stroke, TIA, UTI. Presenting today with abrasion sustained 24 hours prior. States he was using chainsaw when this kicked back and hit his right forearm. He is on blood thinners but was able to stop bleeding by applying pressure. Cleaned wound at home and applied bandaid. Denies significant pain of abrasion. Denies arm pain. Able to move fingers, wrist and elbow without pain. States he needs tdap and his PCP couldn't fit him in today. Feeling well otherwise.   HPI  Past Medical History:  Diagnosis Date  . CAD (coronary artery disease), native coronary artery    09/09/16 PCI/DES x3 to mRCA, OM1 and dLcx/OM2.   . Coronary disease    Status post stenting of the left circumflex coronary in 2009 with a bare-metal stent (with a 3.5x50mm Liberte stent)  . Dyslipidemia   . 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 Hyperlipidemia   . Hypertension   . NSTEMI (non-ST elevated myocardial infarction) ([place]) 2009   BMS CFX  . Stroke ([place])   . TIA (transient ischemic attack)    history of tia  . Urinary tract infection     Patient Active Problem List   Diagnosis Date Noted  . Upper airway cough syndrome 12/31/2016  . 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 Angina pectoris ([place]) 09/09/2016  . 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 Insomnia 12/17/2014  . Neuropathic pain of right foot 12/17/2014  . Coronary disease   . Dyslipidemia   . Essential hypertension     Past Surgical History:  Procedure Laterality Date  . CARDIOVASCULAR STRESS TEST  10-03-08   EF 59%  . CORONARY ANGIOPLASTY WITH STENT PLACEMENT  09/09/2016  . CORONARY STENT INTERVENTION N/A 09/09/2016   Procedure: CORONARY STENT  INTERVENTION;  Surgeon: [name], [name] M, MD;  Location: [facility];  Service: Cardiovascular;  Laterality: N/A;  . LEFT HEART CATH AND CORONARY ANGIOGRAPHY N/A 09/09/2016   Procedure: LEFT HEART CATH AND CORONARY ANGIOGRAPHY;  Surgeon: [name], [name] M, MD;  Location: [facility];  Service: Cardiovascular;  Laterality: N/A;  . US ECHOCARDIOGRAPHY  09-21-08   EF 55-60%       Home Medications    Prior to Admission medications   Medication Sig Start Date End Date Taking? Authorizing Provider  acetaminophen (TYLENOL) 500 MG tablet Take 1,000 mg by mouth every 6 (six) hours as needed for headache (pain).   Yes [provider]  aspirin EC 81 MG tablet Take 1 tablet (81 mg total) by mouth daily. 11/06/13  Yes [name], [name] M, MD  clopidogrel (PLAVIX) 75 MG tablet Take 1 tablet by mouth once daily with breakfast 12/29/18  Yes [name], [name] M, MD  fenofibrate 160 MG tablet Take 1 tablet (160 mg total) by mouth daily. 12/29/18  Yes [name], [name] M, MD  Krill Oil 1000 MG CAPS Take 1,000 mg by mouth at bedtime.   Yes [provider]  nebivolol (BYSTOLIC) 10 MG tablet Take 1 tablet (10 mg total) by mouth daily. 12/29/18  Yes [name], [name] M, MD  Omega-3 1000 MG CAPS Take 4,000 mg by mouth daily.   Yes [provider]  pantoprazole (PROTONIX) 40 MG tablet TAKE 1 TABLET BY MOUTH EVERY DAY NEEDS APPT FOR REFILLS 09/19/19  Yes [name], [name] M, MD  tamsulosin (FLOMAX) 0.4 MG CAPS capsule TAKE 1 CAPSULE BY MOUTH DAILY AFTER SUPPER. 06/28/19  Yes Baity, Coralie Keens, NP  telmisartan (MICARDIS) 80 MG tablet Take 1 tablet (80 mg total) by mouth daily. 12/29/18  Yes [name], [name] M, MD  atorvastatin (LIPITOR) 80 MG tablet Take 1 tablet (80 mg total) by mouth daily. 12/30/18 05/25/19  [name], [name] M, MD  ezetimibe (ZETIA) 10 MG tablet Take 1 tablet (10 mg total) by mouth daily. 04/13/19 07/12/19  [name], [name] M, MD  nitroGLYCERIN (NITROSTAT) 0.4 MG SL tablet Place 1 tablet (0.4 mg  total) under the tongue every 5 (five) minutes as needed for chest pain. 12/29/18   [name], [name] M, MD    Family History Family History  Problem Relation Age of Onset  . Diabetes Mother   . CAD Sister 50       MI, obese  . Cancer Brother        stomach    Social History Social History   Tobacco Use  . Smoking status: Never Smoker  . Smokeless tobacco: Never Used  Vaping Use  . Vaping Use: Never used  Substance Use Topics  . Alcohol use: Yes    Comment: Rarely   . Drug use: No     Allergies   Penicillins, Doxycycline, and Levaquin [levofloxacin in d5w]   Review of Systems Review of Systems  Skin: Positive for wound.  All other systems reviewed and are negative.    Physical Exam Triage Vital Signs ED Triage Vitals  Enc Vitals Group     BP 01/08/20 1416 (!) 147/77     Pulse Rate 01/08/20 1416 71     Resp 01/08/20 1416 20     Temp 01/08/20 1416 97.6 F (36.4 C)     Temp Source 01/08/20 1416 Oral     SpO2 01/08/20 1416 98 %     Weight --      Height --      Head Circumference --      Peak Flow --      Pain Score 01/08/20 1411 0     Pain Loc --      Pain Edu? --      Excl. in [place]? --    No data found.  Updated Vital Signs BP (!) 147/77 (BP Location: Left Arm)   Pulse 71   Temp 97.6 F (36.4 C) (Oral)   Resp 20   SpO2 98%   Visual Acuity Right Eye Distance:   Left Eye Distance:   Bilateral Distance:    Right Eye Near:   Left Eye Near:    Bilateral Near:     Physical Exam Vitals reviewed.  Constitutional:      Appearance: Normal appearance.  HENT:     Head: Normocephalic and atraumatic.  Musculoskeletal:     Comments: ROM right arm intact. ROM right elbow and wrist and fingers intact. Neurovascularly intact.   Skin:    Comments: Right forearm with 2cm x3cm abrasion. Not actively bleeding. No erythema or warmth. Moderately tender to the touch. No bony deformity appreciated.  Neurological:     General: No focal deficit present.      Mental Status: He is alert and oriented to person, place, and time.  Psychiatric:        Mood and Affect: Mood normal.  Behavior: Behavior normal.        Thought Content: Thought content normal.        Judgment: Judgment normal.      UC Treatments / Results  Labs (all labs ordered are listed, but only abnormal results are displayed) Labs Reviewed - No data to display  EKG   Radiology No results found.  Procedures Procedures (including critical care time)  Medications Ordered in UC Medications  Tdap (BOOSTRIX) injection 0.5 mL (0.5 mLs Intramuscular Given 01/08/20 1509)    Initial Impression / Assessment and Plan / UC Course  I have reviewed the triage vital signs and the nursing notes.  Pertinent labs & imaging results that were available during my care of the patient were reviewed by me and considered in my medical decision making (see chart for details).     Would care as below. Tdap administered today.  Final Clinical Impressions(s) / UC Diagnoses   Final diagnoses:  Abrasion of right upper extremity, initial encounter     Discharge Instructions     Wound care 1. Clean the wound 1-2 times a day. To do this: ? Wash the wound with mild soap and water. ? Rinse off the soap. ? Pat a clean towel on the wound to dry it. Do not rub it. 2. Replace with a nonstick bandage or gauze. Keep the bandage (dressing) clean and dry  3. Check your wound every day for signs of infection. Check for: ? Redness, swelling, or pain. ? Fluid or blood. ? Warmth. ? Pus or a bad smell. ? Follow-up with [place] or your PCP if you think your wound is becoming infected.    ED Prescriptions    None     PDMP not reviewed this encounter.   Rhys Martini, PA-C 01/08/20 [phone]

## 2020-01-08 NOTE — Discharge Instructions (Signed)
Wound care Clean the wound 1-2 times a day. To do this: Wash the wound with mild soap and water. Rinse off the soap. Pat a clean towel on the wound to dry it. Do not rub it. Replace with a nonstick bandage or gauze. Keep the bandage (dressing) clean and dry  Check your wound every day for signs of infection. Check for: Redness, swelling, or pain. Fluid or blood. Warmth. Pus or a bad smell. Follow-up with [place] or your PCP if you think your wound is becoming infected.

## 2020-01-09 ENCOUNTER — Other Ambulatory Visit: Payer: Self-pay | Admitting: Cardiology

## 2020-01-10 ENCOUNTER — Other Ambulatory Visit: Payer: Self-pay | Admitting: Cardiology

## 2020-01-17 ENCOUNTER — Other Ambulatory Visit: Payer: Self-pay | Admitting: Cardiology

## 2020-01-19 ENCOUNTER — Other Ambulatory Visit: Payer: Self-pay | Admitting: Cardiology

## 2020-01-23 ENCOUNTER — Other Ambulatory Visit: Payer: Self-pay | Admitting: Cardiology

## 2020-01-31 ENCOUNTER — Other Ambulatory Visit: Payer: Self-pay | Admitting: Cardiology

## 2020-02-05 ENCOUNTER — Other Ambulatory Visit: Payer: Self-pay | Admitting: Cardiology

## 2020-02-08 ENCOUNTER — Other Ambulatory Visit: Payer: Self-pay | Admitting: Cardiology

## 2020-02-13 ENCOUNTER — Telehealth (INDEPENDENT_AMBULATORY_CARE_PROVIDER_SITE_OTHER): Payer: Medicare Other | Admitting: Family Medicine

## 2020-02-13 DIAGNOSIS — R059 Cough, unspecified: Secondary | ICD-10-CM

## 2020-02-13 MED ORDER — AZITHROMYCIN 250 MG PO TABS
ORAL_TABLET | ORAL | 0 refills | Status: DC
Start: 2020-02-13 — End: 2020-03-14

## 2020-02-13 MED ORDER — BENZONATATE 100 MG PO CAPS
100.0000 mg | ORAL_CAPSULE | Freq: Three times a day (TID) | ORAL | 0 refills | Status: DC | PRN
Start: 2020-02-13 — End: 2020-03-14

## 2020-02-13 NOTE — Progress Notes (Signed)
Virtual Visit via Telephone Note  I connected with Colin Lopez on 02/13/20 at 12:00 PM EST by telephone and verified that I am speaking with the correct person using two identifiers.   I discussed the limitations, risks, security and privacy concerns of performing an evaluation and management service by telephone and the availability of in person appointments. I also discussed with the patient that there may be a patient responsible charge related to this service. The patient expressed understanding and agreed to proceed.  Location patient: home, [place] Location provider: work or home office Participants present for the call: patient, provider Patient did not have a visit with me in the prior 7 days to address this/these issue(s).   History of Present Illness:  Acute telemedicine visit for cough and congestion: -Onset: 11 days ago -Symptoms include: fever initially, cough, congestion, lots of sinus congestion, coughing up a lot of mucus, feels more tired than usually, sinusheadache, diarrhea, body aches, poor taste  - lots of thick phlegm -reports has had pneumonia 3 x in the past -Denies: CP, SOB, nausea, vomiting, inability to eat/drink/get out of bed -a friend told him today he might have covid, he is worried about pneumonia  -Has tried:vit c, vitamin D -Pertinent past medical history: HTN, CAD, hx stroke, overweight -Pertinent medication allergies: penicillin, doxy, levaquin -COVID-19 vaccine status: not vaccinated   Observations/Objective: Patient sounds cheerful and well on the phone. I do not appreciate any SOB. Speech and thought processing are grossly intact. Patient reported vitals:  Assessment and Plan:  Cough  -we discussed possible serious and likely etiologies, options for evaluation and workup, limitations of telemedicine visit vs in person visit, treatment, treatment risks and precautions. Pt prefers to treat via telemedicine empirically rather than in person at this  moment.  Discussed treatment options, ideal treatment window, potential complications, isolation and precautions for COVID-19.  Also discussed other potential etiologies including influenza, pneumonia, sinusitis versus other.  He prefers to try empiric treatment with azithromycin and Tessalon for cough.  Other symptomatic care measures summarized in patient instructions.  Advised low threshold to seek prompt in person care if worsening, new symptoms arise or if not improving with treatment. Work/School slipped offered:declined Scheduled follow up with PCP offered: Agrees to call to set up follow-up if needed with primary care office. Advised to seek prompt in person care if worsening, new symptoms arise, or if is not improving with treatment. Advised of options for inperson care in case PCP office not available. Did let the patient know that I only do telemedicine shifts for  on Tuesdays and Thursdays and advised a follow up visit with PCP or at an [facility] if has further questions or concerns.   Follow Up Instructions:  I did not refer this patient for an OV with me in the next 24 hours for this/these issue(s).  I discussed the assessment and treatment plan with the patient. The patient was provided an opportunity to ask questions and all were answered. The patient agreed with the plan and demonstrated an understanding of the instructions.   I spent 16 minutes on the date of this visit in the care of this patient. See summary of tasks completed to properly care for this patient in the detailed notes above which also included counseling of above, review of PMH, medications, allergies, evaluation of the patient and ordering and/or  instructing patient on testing and care options.     Lucretia Kern, DO

## 2020-02-13 NOTE — Patient Instructions (Signed)
  HOME CARE TIPS:  -[place] testing information: [email or web address]/ OR [phone] Most pharmacies also offer testing and home test kits.  -I sent the medication(s) we discussed to your pharmacy: Meds ordered this encounter  Medications  . azithromycin (ZITHROMAX) 250 MG tablet    Sig: 2 tabs day 1, then one tab daily    Dispense:  6 tablet    Refill:  0  . benzonatate (TESSALON PERLES) 100 MG capsule    Sig: Take 1 capsule (100 mg total) by mouth 3 (three) times daily as needed.    Dispense:  20 capsule    Refill:  0    -Can use Imodium if any further diarrhea, follow instructions on the packaging  -can use tylenol  if needed for fevers, aches and pains per instructions  -can use nasal saline a few times per day if you have nasal congestion  -stay hydrated, drink plenty of fluids and eat small healthy meals - avoid dairy  -can take 1000 IU (17mcg) Vit D3 and 100-500 mg of Vit C daily per instructions  -If the Covid test is positive, check out the CDC website for more information on home care, transmission and treatment for COVID19  -follow up with your doctor in 2-3 days unless improving and feeling better  -stay home while sick, except to seek medical care, and if you have COVID19 ideally it would be best to stay home for a full 10 days since the onset of symptoms PLUS one day of no fever and feeling better. Wear a good mask (such as N95 or KN95) if around others to reduce the risk of transmission.  It was nice to meet you today, and I really hope you are feeling better soon. I help [facility] out with telemedicine visits on Tuesdays and Thursdays and am available for visits on those days. If you have any concerns or questions following this visit please schedule a follow up visit with your Primary Care doctor or seek care at a local urgent care clinic to avoid delays in care.    Seek in person care or schedule a follow up  video visit promptly if your symptoms worsen, new concerns arise or you are not improving with treatment. Call 911 and/or seek emergency care if your symptoms are severe or life threatening.

## 2020-02-14 ENCOUNTER — Other Ambulatory Visit: Payer: Self-pay | Admitting: Cardiology

## 2020-02-14 ENCOUNTER — Telehealth: Payer: Self-pay | Admitting: Cardiology

## 2020-02-14 NOTE — Telephone Encounter (Signed)
*  STAT* If patient is at the pharmacy, call can be transferred to refill team.   1. Which medications need to be refilled? (please list name of each medication and dose if known)   nebivolol (BYSTOLIC) 10 MG tablet  fenofibrate 160 MG tablet atorvastatin (LIPITOR) 80 MG tablet   2. Which pharmacy/location (including street and city if local pharmacy) is medication to be sent to? [id number] [place], [place] - [address]  3. Do they need a 30 day or 90 day supply? 90 day   Patient is scheduled 04/18/2020

## 2020-02-16 ENCOUNTER — Other Ambulatory Visit: Payer: Self-pay | Admitting: Cardiology

## 2020-02-21 ENCOUNTER — Other Ambulatory Visit: Payer: Self-pay | Admitting: Cardiology

## 2020-02-29 DIAGNOSIS — N3000 Acute cystitis without hematuria: Secondary | ICD-10-CM | POA: Diagnosis not present

## 2020-03-01 ENCOUNTER — Other Ambulatory Visit: Payer: Self-pay | Admitting: Cardiology

## 2020-03-11 ENCOUNTER — Other Ambulatory Visit: Payer: Self-pay | Admitting: Cardiology

## 2020-03-11 ENCOUNTER — Telehealth: Payer: Self-pay | Admitting: Internal Medicine

## 2020-03-11 NOTE — Telephone Encounter (Signed)
Called and spoke with the pt  He is c/o increased cough x 3 months  He is coughing up large amounts of white, thick sputum "like glue"  He states he has SOB when he coughs  Denies f/c/s, aches, wheezing, chest tightness  Has not had covid vaccine yet  He states he tested negative 1 wk ago  Pt last seen in 2020  Appt with MW scheduled for 03/13/20 at 9 am

## 2020-03-13 ENCOUNTER — Ambulatory Visit (INDEPENDENT_AMBULATORY_CARE_PROVIDER_SITE_OTHER): Payer: Medicare Other

## 2020-03-13 ENCOUNTER — Encounter: Payer: Self-pay | Admitting: Internal Medicine

## 2020-03-13 ENCOUNTER — Ambulatory Visit: Payer: Medicare Other | Admitting: Internal Medicine

## 2020-03-13 ENCOUNTER — Other Ambulatory Visit: Payer: Self-pay

## 2020-03-13 VITALS — BP 128/60 | HR 74 | Temp 97.2°F | Ht 72.0 in | Wt 207.2 lb

## 2020-03-13 DIAGNOSIS — R059 Cough, unspecified: Secondary | ICD-10-CM | POA: Diagnosis not present

## 2020-03-13 DIAGNOSIS — R0609 Other forms of dyspnea: Secondary | ICD-10-CM

## 2020-03-13 DIAGNOSIS — R0600 Dyspnea, unspecified: Secondary | ICD-10-CM | POA: Diagnosis not present

## 2020-03-13 DIAGNOSIS — R911 Solitary pulmonary nodule: Secondary | ICD-10-CM

## 2020-03-13 DIAGNOSIS — R058 Other specified cough: Secondary | ICD-10-CM | POA: Diagnosis not present

## 2020-03-13 DIAGNOSIS — R0602 Shortness of breath: Secondary | ICD-10-CM | POA: Insufficient documentation

## 2020-03-13 LAB — BRAIN NATRIURETIC PEPTIDE: Pro B Natriuretic peptide (BNP): 43 pg/mL (ref 0.0–100.0)

## 2020-03-13 LAB — BASIC METABOLIC PANEL
BUN: 29 mg/dL — ABNORMAL HIGH (ref 6–23)
CO2: 28 mEq/L (ref 19–32)
Calcium: 9.1 mg/dL (ref 8.4–10.5)
Chloride: 105 mEq/L (ref 96–112)
Creatinine, Ser: 1.36 mg/dL (ref 0.40–1.50)
GFR: 49.22 mL/min — ABNORMAL LOW (ref >60.00)
Glucose, Bld: 113 mg/dL — ABNORMAL HIGH (ref 70–99)
Potassium: 4.6 mEq/L (ref 3.5–5.1)
Sodium: 137 mEq/L (ref 135–145)

## 2020-03-13 LAB — CBC WITH DIFFERENTIAL/PLATELET
Basophils Absolute: 0.1 10*3/uL (ref 0.0–0.1)
Basophils Relative: 1.1 % (ref 0.0–3.0)
Eosinophils Absolute: 0.1 10*3/uL (ref 0.0–0.7)
Eosinophils Relative: 1.4 % (ref 0.0–5.0)
HCT: 32.4 % — ABNORMAL LOW (ref 39.0–52.0)
Hemoglobin: 10.9 g/dL — ABNORMAL LOW (ref 13.0–17.0)
Lymphocytes Relative: 23.3 % (ref 12.0–46.0)
Lymphs Abs: 1.4 10*3/uL (ref 0.7–4.0)
MCHC: 33.5 g/dL (ref 30.0–36.0)
MCV: 91.8 fl (ref 78.0–100.0)
Monocytes Absolute: 0.5 10*3/uL (ref 0.1–1.0)
Monocytes Relative: 8.3 % (ref 3.0–12.0)
Neutro Abs: 3.9 10*3/uL (ref 1.4–7.7)
Neutrophils Relative %: 65.9 % (ref 43.0–77.0)
Platelets: 121 10*3/uL — ABNORMAL LOW (ref 150.0–400.0)
RBC: 3.53 Mil/uL — ABNORMAL LOW (ref 4.22–5.81)
RDW: 18.2 % — ABNORMAL HIGH (ref 11.5–15.5)
WBC: 5.8 10*3/uL (ref 4.0–10.5)

## 2020-03-13 LAB — CK: Total CK: 268 U/L — ABNORMAL HIGH (ref 7–232)

## 2020-03-13 MED ORDER — TRAMADOL HCL 50 MG PO TABS: 50.0000 mg | ORAL_TABLET | ORAL | 0 refills | Status: AC | PRN

## 2020-03-13 MED ORDER — PREDNISONE 10 MG PO TABS
ORAL_TABLET | ORAL | 0 refills | Status: DC
Start: 2020-03-13 — End: 2020-03-29

## 2020-03-13 NOTE — Progress Notes (Signed)
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 Pulmonary Consult    Referred by Dr. Danise Mina for eval of lung mass. Pt states had PNA recently and today his breathing is at baseline for him. He states before his PNA he painted a car and he wore a mask, but did not realize there was a hole in it. 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   onset of symptoms was abrupt one day p painting car with new acetone based paint then w/in 24h L posterolateral pleuritic cp/nasal congestion with epistaxis and hemoptysis >  eval 04/20/16 with cxr /CT? 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 Follow-up    CXR repeated today. He states "my breathing is fine". No new co's today. He is down 2 lbs since last ov 05/21/16.   ha is worse in pm's variable seems worse in pm  L cp has not recurred/ no sob or cough rec bystolic 10 mg daily until you see your primary care doctor w/in next couple of weeks Please  see patient coordinator before you leave today  to schedule PET scan - neg           12/30/2016  f/u ov/[place] re: "worse cold I've ever had" p flu/pna shots in oct 2018  Chief Complaint  Patient presents with  . Follow-up    Pt c/o cough, non prod that started after he had flu and pna vaccine 10/2016.  Cough does occ wake him up in the night.   coughing fits can be severe day > noct / paroxysmal s pattern and completely dry but feels like he's "finally getting over his cold"  Not limited by breathing from desired activities   Sleeping ok rec We will call you to schedule the CT chest in 04/2017   Late add: move up ct to prior to 02/2017 > did not do     02/22/2018  f/u ov/Symiah Nowotny re:  spn LLL / chronic cough on acei  Chief Complaint  Patient presents with  . Acute Visit    Pt c/o left chest soreness since had a fall on 02/17/2018.  Dyspnea:  MMRC1 = can walk nl pace, flat grade, can't hurry or go uphills or steps s sob  Cough: feels like getting choked  Sleeping: fine bed flat 4 pillows  SABA use: none  02: none  rec Stop prinivil (lisinopril) and start micardis 80  mg one daily  - if too strong break it in half     03/13/2020  Acute ov/Shahira Fiske re: uacs/ spn   / already zpak and tessalon since  02/13/20 no better Chief Complaint  Patient presents with  . Acute Visit    C/o cough sticky white and sob x 63mths, wheezing occass.  Dyspnea: limited by legs aching (calves) Wynelle Link paint s radicular features  Cough: flared p exp to sick fm members, did not test for covid last week  With thick white mucus Sleeping: flat bed, lots of pillows or recliner or can't sleep s cough  SABA use: none  02: none Covid status:   Unvaccinated  With urinary problems since admit cone> to see [facility] 02/15/20   No obvious day to day or daytime variability or assoc   purulent sputum or mucus plugs or hemoptysis or cp or chest tightness,  overt sinus or hb symptoms.   Also denies any obvious fluctuation of  symptoms with weather or environmental changes or other aggravating or alleviating factors except as outlined above   No unusual exposure hx or h/o childhood pna/ asthma or knowledge of premature birth.  Current Allergies, Complete Past Medical History, Past Surgical History, Family History, and Social History were reviewed in [employer] record.  ROS  The following are not active complaints unless bolded Hoarseness, sore throat, dysphagia, dental problems, itching, sneezing,  nasal congestion or discharge of excess mucus or purulent secretions, ear ache,   fever, chills, sweats, unintended wt loss or wt gain, classically pleuritic or exertional cp,  orthopnea pnd or arm/hand swelling  or leg swelling, presyncope, palpitations, abdominal pain, anorexia, nausea, vomiting, diarrhea  or change in bowel habits or change in bladder habits, change in stools or change in urine, dysuria, hematuria,  rash, arthralgias myalgias, visual complaints, headache, numbness, weakness or ataxia or problems with walking or coordination,  change in mood or  memory.        Current Meds  Medication Sig  . acetaminophen (TYLENOL) 500 MG tablet Take 1,000 mg by mouth every 6 (six) hours as needed for headache (pain).  [name] aspirin EC 81 MG tablet Take 1 tablet (81 mg total) by mouth daily.  [name] atorvastatin (LIPITOR) 80 MG tablet TAKE 1 TABLET BY MOUTH EVERY DAY  . clopidogrel (PLAVIX) 75 MG tablet TAKE 1 TABLET BY MOUTH ONCE DAILY WITH BREAKFAST  . fenofibrate 160 MG tablet TAKE 1 TABLET BY MOUTH EVERY DAY  . Krill Oil 1000 MG CAPS Take 1,000 mg by mouth at bedtime.  . nebivolol (BYSTOLIC) 10 MG tablet TAKE 1 TABLET BY MOUTH EVERY DAY  . nitroGLYCERIN (NITROSTAT) 0.4 MG SL tablet Place 1 tablet (0.4 mg total) under the tongue every 5 (five) minutes as needed for chest pain.  . Omega-3 1000 MG CAPS Take 4,000 mg by mouth daily.  . pantoprazole (PROTONIX) 40 MG tablet TAKE 1 TABLET BY MOUTH EVERY DAY  NEEDS APPT FOR REFILLS  . tamsulosin (FLOMAX) 0.4 MG CAPS capsule TAKE 1 CAPSULE BY MOUTH DAILY AFTER SUPPER. (Patient taking differently: Take 0.4 mg by mouth. Take 1 tablet twice a day by mouth.)  . telmisartan (MICARDIS) 80 MG tablet Take 1 tablet (80 mg total) by mouth daily.  Objective:   Physical Exam    03/13/2020          207 02/22/2018        215  12/30/2016      207  06/19/2016          209  05/21/16 211 lb (95.7 kg)  05/05/16 214 lb (97.1 kg)  04/20/16 212 lb 4 oz (96.3 kg)     Vital signs reviewed  03/13/2020  - Note at rest 02 sats  128% on RA   General appearance:    amb wm nad     Reports:  Edentulous     HEENT : pt wearing mask not removed for exam due to covid -19 concerns.    NECK :  without JVD/Nodes/TM/ nl carotid upstrokes bilaterally   LUNGS: no acc muscle use,  Nl contour chest which is clear to A and P bilaterally without cough on insp or exp maneuvers   CV:  RRR  no s3 or murmur or increase in P2, and trace bilateral pedal  edema   ABD:  Obese, soft and nontender with nl inspiratory excursion in the supine position. No bruits or organomegaly appreciated, bowel sounds nl  MS:  Nl gait/ ext warm without deformities, calf tenderness, cyanosis or clubbing No obvious joint restrictions   SKIN: warm and dry without lesions    NEURO:  alert, approp, nl sensorium with  no motor or cerebellar deficits apparent.         CXR PA and Lateral:   03/13/2020 :    I personally reviewed images and agree with radiology impression as follows:    Enlarging left mid lung zone pulmonary nodule with possible development of a small satellite nodule centrally. A contrast enhanced CT examination may be more helpful for further evaluation.  Labs ordered/ reviewed:      Chemistry      Component Value Date/Time   NA 137 03/13/2020 1008   NA 140 12/29/2018 1553   NA 138 11/24/2012 1910   K 4.6 03/13/2020 1008   K 3.8 11/24/2012 1910    CL 105 03/13/2020 1008   CL 106 11/24/2012 1910   CO2 28 03/13/2020 1008   CO2 28 11/24/2012 1910   BUN 29 (H) 03/13/2020 1008   BUN 32 (H) 12/29/2018 1553   BUN 20 (H) 11/24/2012 1910   CREATININE 1.36 03/13/2020 1008   CREATININE 0.95 11/24/2012 1910      Component Value Date/Time   CALCIUM 9.1 03/13/2020 1008   CALCIUM 8.8 11/24/2012 1910   ALKPHOS 47 05/25/2019 1636   ALKPHOS 102 11/24/2012 1910   AST 30 05/25/2019 1636   AST 25 11/24/2012 1910   ALT 24 05/25/2019 1636   ALT 35 11/24/2012 1910   BILITOT 1.1 05/25/2019 1636   BILITOT 0.5 03/30/2019 1107   BILITOT 0.4 11/24/2012 1910        Lab Results  Component Value Date   WBC 5.8 03/13/2020   HGB 10.9 (L) 03/13/2020   HCT 32.4 (L) 03/13/2020   MCV 91.8 03/13/2020   PLT 121.0 (L) 03/13/2020        EOS                                                              0.1  03/13/2020   Lab Results  Component Value Date   DDIMER 1.35 (H) 03/13/2020      Lab Results  Component Value Date   TSH 2.20 03/13/2020     Lab Results  Component Value Date   PROBNP 43.0 03/13/2020                Assessment:

## 2020-03-13 NOTE — Patient Instructions (Addendum)
Pantoprazole (protonix) 40 mg   Take  30-60 min before first meal of the day and Pepcid (famotidine)  20 mg one after supper  until return to office - this is the best way to tell whether stomach acid is contributing to your problem.    Stop all fish oil and the lipitor for 2 weeks  - if aches improve, start back on one half of the lipitor   Prednisone 10 mg take  4 each am x 2 days,   2 each am x 2 days,  1 each am x 2 days and stop   For cough > mucinex dm 1200 mg every 12 hours and supplement if still coughing supplement tramadol 50 mg every 4 hours    AFTER you see Dr Tresa Moore  >>> for  drainage / throat tickle try take CHLORPHENIRAMINE  4 mg  (Chlortab 4mg   at [employer] should be easiest to find in the green box)  take one every 4 hours as needed - available over the counter- may cause drowsiness so start with just a bedtime dose or two and see how you tolerate it before trying in daytime    Please remember to go to the lab and x-ray department  for your tests - we will call you with the results when they are available.      Please schedule a follow up office visit in 2 weeks, sooner if needed  with all medications /inhalers/ solutions in hand so we can verify exactly what you are taking. This includes all medications from all doctors and over the counters

## 2020-03-14 ENCOUNTER — Other Ambulatory Visit: Payer: Self-pay | Admitting: Cardiology

## 2020-03-14 ENCOUNTER — Encounter: Payer: Self-pay | Admitting: Internal Medicine

## 2020-03-14 DIAGNOSIS — N3000 Acute cystitis without hematuria: Secondary | ICD-10-CM | POA: Diagnosis not present

## 2020-03-14 LAB — IGE: IgE (Immunoglobulin E), Serum: 6 kU/L (ref <=114)

## 2020-03-14 LAB — TSH: TSH: 2.2 u[IU]/mL (ref 0.35–4.50)

## 2020-03-14 LAB — D-DIMER, QUANTITATIVE: D-Dimer, Quant: 1.35 mcg/mL FEU — ABNORMAL HIGH (ref <0.50)

## 2020-03-14 NOTE — Progress Notes (Signed)
Tried calling again and still no answer- left another msg to call back.

## 2020-03-14 NOTE — Assessment & Plan Note (Signed)
Developed on ACEi p flu/pna shots 10/2016  - persistent sense of globus repored 02/22/2018 rec trial off acei >  Resolved - recurrent p uri from fm member in early Jan 2022 / not tested for covid until late Feb 2022 neg  Of the three most common causes of  Sub-acute / recurrent or chronic cough, only one (GERD)  can actually contribute to/ trigger  the other two (asthma and post nasal drip syndrome)  and perpetuate the cylce of cough.  While not intuitively obvious, many patients with chronic low grade reflux do not cough until there is a primary insult that disturbs the protective epithelial barrier and exposes sensitive nerve endings.   This is typically viral but can due to PNDS and  either may apply here.   The point is that once this occurs, it is difficult to eliminate the cycle  using anything but a maximally effective acid suppression regimen at least in the short run, accompanied by an appropriate diet to address non acid GERD and control / eliminate the cough itself for at least 3 days with tramadol and eliminate pnds with 1st gen H1 blockers per guidelines >>> also so added 6 day taper off  Prednisone starting at 40 mg per day in case of component of Th-2 driven upper or lower airways inflammation (if cough responds short term only to relapse before return while will on full rx for uacs (as above), then  that would point to allergic rhinitis/ asthma or eos bronchitis as alternative dx)

## 2020-03-14 NOTE — Assessment & Plan Note (Signed)
Onset early jan 2022 p uri/ covid not excluded at the time  No evidence chf or thyroid dz, does have mild elevation ddimer and mild anemia as well so likely multifactorial   Rec:  CTa next step

## 2020-03-14 NOTE — Assessment & Plan Note (Addendum)
Never smoker  CTa Chest  04/21/16 1. No evidence of acute pulmonary embolus. 2. Superior segment left lower lobe rounded 2.4 cm opacity is nonspecific and corresponds to the radiographic finding one day prior - cxr 06/19/2016 more dense, localized > rec PET 06/30/2016 >>> althhough there is a persistent subpleural nodule in the left lower lobe, this demonstrates only low level hypermetabolic activity, predominately along the pleural surface. A benign etiology such as rounded atelectasis is favored, although low grade neoplasm cannot be completely excluded > rec ov with cxr in 3 months  - cxr 09/30/2016 c/w regression - only seen on PA view with rib superimposed making it look denser - 12/30/2016 no change > rec CT by 02/12/17 > did not do  - 02/22/2018 cxr increasing nodularity > rec CT s contrast 02/24/2018 >>> 1. Grossly unchanged subpleural left lower lobe pulmonary nodule, potentially representing rounded atelectasis > rec ov one year as lesion stable  x 2 years and he is low risk    - cxr 03/13/2020 shows growth > CT chest ordered 03/14/2020           Each maintenance medication was reviewed in detail including emphasizing most importantly the difference between maintenance and prns and under what circumstances the prns are to be triggered using an action plan format where appropriate.  Total time for H and P, chart review, counseling, reviewing s) and generating customized AVS unique to this acute office visit / same day charting = 42 min

## 2020-03-14 NOTE — Progress Notes (Signed)
LMTCB

## 2020-03-15 ENCOUNTER — Telehealth: Payer: Self-pay | Admitting: Internal Medicine

## 2020-03-15 ENCOUNTER — Other Ambulatory Visit: Payer: Self-pay

## 2020-03-15 ENCOUNTER — Ambulatory Visit
Admission: RE | Admit: 2020-03-15 | Discharge: 2020-03-15 | Disposition: A | Discharge status: Home / Self Care | Payer: Medicare Other | Source: Ambulatory Visit | Attending: Internal Medicine | Admitting: Internal Medicine

## 2020-03-15 DIAGNOSIS — R0600 Dyspnea, unspecified: Secondary | ICD-10-CM

## 2020-03-15 DIAGNOSIS — R0609 Other forms of dyspnea: Secondary | ICD-10-CM

## 2020-03-15 DIAGNOSIS — I700 Atherosclerosis of aorta: Secondary | ICD-10-CM | POA: Diagnosis not present

## 2020-03-15 DIAGNOSIS — R0602 Shortness of breath: Secondary | ICD-10-CM | POA: Diagnosis not present

## 2020-03-15 DIAGNOSIS — R079 Chest pain, unspecified: Secondary | ICD-10-CM

## 2020-03-15 DIAGNOSIS — R911 Solitary pulmonary nodule: Secondary | ICD-10-CM | POA: Diagnosis not present

## 2020-03-15 MED ORDER — IOHEXOL 350 MG/ML SOLN
75.0000 mL | Freq: Once | INTRAVENOUS | Status: AC | PRN
  Administered 2020-03-15: 75 mL via INTRAVENOUS

## 2020-03-15 NOTE — Telephone Encounter (Signed)
ATC x1.  LVM to return call. 

## 2020-03-15 NOTE — Telephone Encounter (Signed)
Tanda Rockers, MD  Rosana Berger, CMA Call pt: Reviewed cxr the spot we were following looks a bit larger but would not explain the sob so needs CTa chest dx doe / pos dimer   Spoke with the pt's spouse ok per DPR and notified of results/recs per MW  She verbalized understanding and states pt okay with CTA so I have ordered this  Nothing further needed

## 2020-03-15 NOTE — Progress Notes (Signed)
Pt notified- see phone note dated 03/15/20

## 2020-03-16 ENCOUNTER — Other Ambulatory Visit: Payer: Self-pay | Admitting: Cardiology

## 2020-03-18 ENCOUNTER — Other Ambulatory Visit: Payer: Self-pay

## 2020-03-18 MED ORDER — FUROSEMIDE 20 MG PO TABS: 20.0000 mg | ORAL_TABLET | Freq: Every day | ORAL | 0 refills | Status: DC

## 2020-03-18 MED ORDER — FENOFIBRATE 160 MG PO TABS: 160.0000 mg | ORAL_TABLET | Freq: Every day | ORAL | 2 refills | Status: DC

## 2020-03-18 NOTE — Telephone Encounter (Signed)
ATC patient about message of swelling LMTCB

## 2020-03-18 NOTE — Telephone Encounter (Signed)
Called and spoke with [name] letting her know the info stated by MW and she verbalized understanding. Stated to her after pt has cardiology appt in April, that should also help sort things out but stated to her that MW wanted to see if the lasix would help with excess fluid pt is having. Verified preferred pharmacy and sent Rx to pharmacy for pt. Nothing further needed.

## 2020-03-18 NOTE — Progress Notes (Signed)
LMTCB for the pt 

## 2020-03-18 NOTE — Telephone Encounter (Signed)
Go ahead and try lasix 20 mg daily pending f/u with cards planned as not clear where the fluid is coming from at this point and cards will hep sort this out

## 2020-03-18 NOTE — Telephone Encounter (Signed)
Called and spoke with pt's wife [name] who states pt has had increased swelling in feet and ankles mainly with some swelling in legs. Asked her if pt had this when pt saw MW 3/2 and she said that he did but stated that they forgot to discuss it with MW at [place].  While speaking with [name], she asked about pt's CT results and I went over the results of pt's CT with her and she verbalized understanding.  [name] also stated that pt does have an upcoming follow up with cardiology in April and is unsure if the swelling could be coming from pt's heart.  [name] wants to know if anything could be recommended. Dr. Melvyn Novas, please advise.

## 2020-03-18 NOTE — Telephone Encounter (Signed)
OK to refill fenofibrate with refills  Tessah Patchen [name] MD, [facility]

## 2020-03-23 ENCOUNTER — Other Ambulatory Visit: Payer: Self-pay | Admitting: Cardiology

## 2020-03-25 ENCOUNTER — Other Ambulatory Visit: Payer: Self-pay | Admitting: Cardiology

## 2020-03-29 ENCOUNTER — Ambulatory Visit: Payer: Medicare Other | Admitting: Internal Medicine

## 2020-03-29 ENCOUNTER — Other Ambulatory Visit: Payer: Self-pay

## 2020-03-29 ENCOUNTER — Encounter: Payer: Self-pay | Admitting: Internal Medicine

## 2020-03-29 DIAGNOSIS — R0609 Other forms of dyspnea: Secondary | ICD-10-CM

## 2020-03-29 DIAGNOSIS — R911 Solitary pulmonary nodule: Secondary | ICD-10-CM | POA: Diagnosis not present

## 2020-03-29 DIAGNOSIS — E785 Hyperlipidemia, unspecified: Secondary | ICD-10-CM | POA: Diagnosis not present

## 2020-03-29 DIAGNOSIS — R058 Other specified cough: Secondary | ICD-10-CM

## 2020-03-29 DIAGNOSIS — R0600 Dyspnea, unspecified: Secondary | ICD-10-CM

## 2020-03-29 NOTE — Progress Notes (Signed)
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 Pulmonary Consult    Referred by Dr. Danise Mina for eval of lung mass. Pt states had PNA recently and today his breathing is at baseline for him. He states before his PNA he painted a car and he wore a mask, but did not realize there was a hole in it. 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   onset of symptoms was abrupt one day p painting car with new acetone based paint then w/in 24h L posterolateral pleuritic cp/nasal congestion with epistaxis and hemoptysis >  eval 04/20/16 with cxr /CT? pna neg PE >rx Doxy x 10 DAYS > ALL BETTER symptomatically in terms of the cp/ hemoptysis  Still has a little bloody nasal discharge  Has h/o TB exp but took INH x 1.5 years  X decades prior to OV   rec Take the fish oil if you must with breakfast  Or stop it and eat more baked/grilled fish (especially salmon)  to help you lose weight by replacing the calories in the fish oil  And unhealthy foods with lower calorie alternatives (like salmon)  Call if nosebleeds continue and you may need a sinus CT before next visit - hold aspirin if gets worse     06/19/2016  f/u ov/Unknown Flannigan re: spn LLL /hbp Chief Complaint  Patient presents with  . Follow-up    CXR repeated today. He states "my breathing is fine". No new co's today. He is down 2 lbs since last ov 05/21/16.   ha is worse in pm's variable seems worse in pm  L cp has not recurred/ no sob or cough rec bystolic 10 mg daily until you see your primary care doctor w/in next couple of weeks Please  see patient coordinator before you leave today  to schedule PET scan - neg           12/30/2016  f/u ov/Alechia Lezama re: "worse cold I've ever had" p flu/pna shots in oct 2018  Chief Complaint  Patient presents with  . Follow-up    Pt c/o cough, non prod that started after he had flu and pna vaccine 10/2016.  Cough does occ wake him up in the night.   coughing fits can be severe day > noct / paroxysmal s pattern and completely dry but feels like he's "finally getting over his cold"  Not limited by breathing from desired activities   Sleeping ok rec We will call you to schedule the CT chest in 04/2017   Late add: move up ct to prior to 02/2017 > did not do     02/22/2018  f/u ov/Olukemi Panchal re:  spn LLL / chronic cough on acei  Chief Complaint  Patient presents with  . Acute Visit    Pt c/o left chest soreness since had a fall on 02/17/2018.  Dyspnea:  MMRC1 = can walk nl pace, flat grade, can't hurry or go uphills or steps s sob  Cough: feels like getting choked  Sleeping: fine bed flat 4 pillows  SABA use: none  02: none  rec Stop prinivil (lisinopril) and start micardis 80  mg one daily  - if too strong break it in half     03/13/2020  Acute ov/Tashunda Vandezande re: uacs/ spn   / already zpak and tessalon since  02/13/20 no better Chief Complaint  Patient presents with  . Acute Visit    C/o cough sticky white and sob x 14mths, wheezing occass.  Dyspnea: limited by legs aching (calves) /back pain  s radicular features  Cough: flared p exp to sick fm members, did not test for covid last week  With thick white mucus Sleeping: flat bed, lots of pillows or recliner or can't sleep s cough  SABA use: none  02: none Covid status:   Unvaccinated  With urinary problems since admit cone> to see Manny 02/15/20 rec Pantoprazole (protonix) 40 mg   Take  30-60 min before first meal of the day and Pepcid (famotidine)  20 mg one after supper  until return to office - this is the best way to tell whether stomach acid is  contributing to your problem.   Stop all fish oil and the lipitor for 2 weeks  - if aches improve, start back on one half of the lipitor  Prednisone 10 mg take  4 each am x 2 days,   2 each am x 2 days,  1 each am x 2 days and stop  For cough > mucinex dm 1200 mg every 12 hours and supplement if still coughing supplement tramadol 50 mg every 4 hours  AFTER you see Dr Tresa Moore  >>> for  drainage / throat tickle try take CHLORPHENIRAMINE  4 mg  (Chlortab 4mg  ) Please schedule a follow up office visit in 2 weeks, sooner if needed  with all medications /inhalers/ solutions in hand so we can verify exactly what you are taking. This includes all medications from all doctors and over the counters     03/29/2020  f/u ov/Erial Fikes re:  Cyclical cough/ unexplained sob/ calf pain ? lipitor related  No chief complaint on file. 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 Helping calf pain?    No obvious day to day or daytime variability or assoc excess/ purulent sputum or mucus plugs or hemoptysis or cp or chest tightness, subjective wheeze or overt sinus or hb symptoms.   Sleeping back to baseline  without nocturnal  or early am exacerbation  of respiratory  c/o's or need for noct saba. Also denies any obvious fluctuation of symptoms with weather or environmental changes or other aggravating or alleviating factors except as outlined above   No unusual exposure hx or h/o childhood pna/ asthma or knowledge of premature birth.  Current Allergies, Complete Past Medical History, Past Surgical History, Family History, and Social History were reviewed in [employer] record.  ROS  The following are not active complaints unless bolded Hoarseness, sore throat, dysphagia, dental problems, itching,  sneezing,  nasal congestion or discharge of excess mucus or purulent secretions, ear ache,   fever, chills, sweats, unintended wt loss or wt gain, classically pleuritic or exertional cp,  orthopnea pnd or arm/hand swelling  or leg swelling, presyncope, palpitations, abdominal pain, anorexia, nausea, vomiting, diarrhea  or change in bowel habits or change in bladder habits, change in stools or change in urine, dysuria, hematuria,  rash, arthralgias, visual complaints, headache, numbness, weakness or ataxia or problems with walking or coordination,  change in mood or  memory.        Current Meds  Medication Sig  . acetaminophen (TYLENOL) 500 MG tablet Take 1,000 mg by mouth every 6 (six) hours as needed for headache (pain).  [name] aspirin EC 81 MG tablet Take 1 tablet (81 mg total) by mouth daily.  [name] atorvastatin (LIPITOR) 80 MG tablet TAKE 1 TABLET BY MOUTH EVERY DAY  . clopidogrel (PLAVIX) 75 MG tablet Take 1 tablet by mouth once daily with breakfast. Keep upcoming appointment  . dextromethorphan-guaiFENesin (MUCINEX DM) 30-600 MG 12hr tablet Take 1 tablet by mouth 2 (two) times daily as needed for cough.  . famotidine (PEPCID) 20 MG tablet Take 20 mg by mouth daily.  . fenofibrate 160 MG tablet Take 1 tablet (160 mg total) by mouth daily.  . furosemide (LASIX) 20 MG tablet Take 1 tablet (20 mg total) by mouth daily.  . nebivolol (BYSTOLIC) 10 MG tablet TAKE 1 TABLET BY MOUTH EVERY DAY  . nitroGLYCERIN (NITROSTAT) 0.4 MG SL tablet Place 1 tablet (0.4 mg total) under the tongue every 5 (five) minutes as needed for chest pain.  . pantoprazole (PROTONIX) 40 MG tablet TAKE 1 TABLET BY MOUTH EVERY DAY NEEDS APPT FOR REFILLS  . tamsulosin (FLOMAX) 0.4 MG CAPS capsule TAKE 1 CAPSULE BY MOUTH DAILY AFTER SUPPER. (Patient taking differently: Take 0.4 mg by mouth. Take 1 tablet twice a day by mouth.)  . telmisartan (MICARDIS) 80 MG tablet Take 1 tablet (80 mg total) by mouth daily.  . traMADol (ULTRAM) 50 MG  tablet Take 50 mg by mouth every 6 (six) hours as needed.              Objective:   Physical Exam   03/29/2020       203 03/13/2020          207 02/22/2018        215  12/30/2016      207  06/19/2016          209  05/21/16 211 lb (95.7 kg)  05/05/16 214 lb (97.1 kg)  04/20/16 212 lb 4 oz (96.3 kg)    Vital signs reviewed  03/29/2020  - Note at rest 02 sats  95% on RA   General appearance:    amb mod obese wm nad easily confused with details of care    Reports:  Edentulous       HEENT : pt wearing mask not removed for exam due to covid -19 concerns.    NECK :  without JVD/Nodes/TM/ nl carotid upstrokes bilaterally   LUNGS: no acc muscle use,  Nl contour chest which is clear to A and P bilaterally without cough on insp or exp maneuvers   CV:  RRR  no s3 or murmur or increase in P2, and no edema   ABD:  soft and nontender with nl inspiratory excursion in the supine position. No bruits or organomegaly appreciated, bowel sounds nl  MS:  Nl gait/ ext warm without deformities, calf tenderness, cyanosis or clubbing No obvious joint restrictions   SKIN: warm and dry without lesions    NEURO:  alert, approp, nl sensorium with  no motor or cerebellar deficits apparent.  I personally reviewed images and agree with radiology impression as follows:   Chest CTa 03/15/20  1. No evidence of pulmonary embolus. 2. Dependent ground-glass attenuation within the lungs felt to represent hypoventilatory change rather than airspace disease. 3. Stable 19 x 14 mm left lower lobe pulmonary nodule. Given long-term stability, this can be considered benign.           Assessment:

## 2020-03-29 NOTE — Assessment & Plan Note (Addendum)
Muscle aches reported 03/13/20 - 03/29/2020  ? Better off high dose lipitor but still using tramadol p 2 weeks off it with cpk 268 on 80 mg  -  03/29/2020 rec wean off tramadol and see if muscle aches really are better before rechallenging with lipitor

## 2020-03-29 NOTE — Assessment & Plan Note (Signed)
Developed on ACEi p flu/pna shots 10/2016  - persistent sense of globus repored 02/22/2018 rec trial off acei >  Resolved - recurrent p uri from fm member in early Jan 2022 / not tested for covid until late Feb 2022 neg  - CTa chest 03/15/20 neg - resolved 03/29/2020 on gerd rx/ short course prednisone so rec continue gerd rx

## 2020-03-29 NOTE — Assessment & Plan Note (Signed)
Never smoker  CTa Chest  04/21/16 1. No evidence of acute pulmonary embolus. 2. Superior segment left lower lobe rounded 2.4 cm opacity is nonspecific and corresponds to the radiographic finding one day prior - cxr 06/19/2016 more dense, localized > rec PET 06/30/2016 >>> althhough there is a persistent subpleural nodule in the left lower lobe, this demonstrates only low level hypermetabolic activity, predominately along the pleural surface. A benign etiology such as rounded atelectasis is favored, although low grade neoplasm cannot be completely excluded > rec ov with cxr in 3 months  - cxr 09/30/2016 c/w regression - only seen on PA view with rib superimposed making it look denser - 12/30/2016 no change > rec CT by 02/12/17 > did not do  - 02/22/2018 cxr increasing nodularity > rec CT s contrast 02/24/2018 >>> 1. Grossly unchanged subpleural left lower lobe pulmonary nodule, potentially representing rounded atelectasis > rec ov one year as lesion stable  x 2 years and he is low risk  - cxr 03/13/2020 shows growth > CT chest  03/15/20  >>> no change, no further studies needed

## 2020-03-29 NOTE — Patient Instructions (Signed)
Continue acid Pantoprazole (protonix) 40 mg   Take  30-60 min before first meal of the day and Pepcid (famotidine)  20 mg one @  Supper  until return to office - this is the best way to tell whether stomach acid is contributing to your problem.    Try to wean off the tramadol to see what if any symptoms flare   Please schedule a follow up office visit in 6 weeks, call sooner if needed

## 2020-03-29 NOTE — Assessment & Plan Note (Addendum)
Onset early jan 2022 p uri/ covid not excluded at the time -  Chest CTa 03/15/20  1. No evidence of pulmonary embolus. 2. Dependent ground-glass attenuation within the lungs felt to represent hypoventilatory change rather than airspace disease. 3. Stable 19 x 14 mm left lower lobe pulmonary nodule. Given long-term stability, this can be considered benign -  03/29/2020   [facility] RA  2 laps @ approx 288ft each @ moderately fast  pace  stopped due to end of study, min sob and sats 92% at end   I strongly suspect he had covid and having a normal recovery so should reconsider going ahead with vaccine before the next variant arrives.   Each maintenance medication was reviewed in detail including emphasizing most importantly the difference between maintenance and prns and under what circumstances the prns are to be triggered using an action plan format where appropriate.  Total time for H and P, chart review, counseling,  directly observing portions of ambulatory 02 saturation study/ and generating customized AVS unique to this office visit / same day charting = 35 min

## 2020-04-09 ENCOUNTER — Other Ambulatory Visit: Payer: Self-pay | Admitting: Internal Medicine

## 2020-04-09 ENCOUNTER — Other Ambulatory Visit: Payer: Self-pay | Admitting: Cardiology

## 2020-04-12 ENCOUNTER — Other Ambulatory Visit: Payer: Self-pay | Admitting: Cardiology

## 2020-04-12 NOTE — Progress Notes (Signed)
Colin Lopez Date of Birth: 02-04-1940 Medical Record #[id number]  History of Present Illness: Colin Lopez is seen for follow up CAD.  He has a history of coronary disease with a non-ST elevation myocardial infarction in 2009. He underwent stenting of the left circumflex coronary with a 3.5 x 16 mm Liberte stent. He does have a history of hypertension and hyperlipidemia. He is intolerant to Crestor.  He was seen in February 2018 with some atypical left shoulder and neck pain.  A Myoview study was ordered but was never done. He had a CT chest  which was negative for PE. There was a rounded density in the LLL of unclear etiology. Later PET scan in June showed low metabolic activity and low risk.    He underwent a Lexiscan myoview study which showed multiple perfusion defects and EF 37%. This led to a cardiac cath in late August 2018  that showed 3 vessel disease with successful management with DES x 3 to OM1, distal LCx, and mid RCA.   He was admitted in late September 2018. He presented with headache, blurred vision, generalized weakness, dizziness.  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  CT angiogram of the head and neck showed both ICA widely patent, atherosclerotic disease of the supraclinoid internal carotid arteries. No intracranial branch vessel disease.  2-D echo on 09/16/16 showed EF of 55-60% with grade 1 diastolic dysfunction, no wall motion abnormalities. Neurology consulted, continue aspirin, Plavix.   He was admitted in May 2021 with AKI and urinary infection.  Earlier this year he had apparent PNA. Treated at home. Also had a UTI. Afterwards was SOB. Seen by Dr Melvyn Novas. CT negative for PE or acute process. Was having some muscle aches so lipitor dose reduced. CK was 268. Now reports his breathing is better. Notes pain in his knees but not much myalgias. 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 He had some LE edema when he had UTI. This resolved with lasix.   Current Outpatient Medications on File Prior to Visit  Medication Sig Dispense Refill  . acetaminophen (TYLENOL) 500 MG tablet Take 1,000 mg by mouth every 6 (six) hours as needed for headache (pain).    [name] aspirin EC 81 MG tablet Take 1 tablet (81 mg total) by mouth daily. 90 tablet 3  . atorvastatin (LIPITOR) 80 MG tablet TAKE 1 TABLET BY MOUTH EVERY DAY (Patient taking differently: Pt taking 1/2) 15 tablet 0  . clopidogrel (PLAVIX) 75 MG tablet Take 1 tablet by mouth once daily with breakfast. Keep upcoming appointment 60 tablet 0  . ezetimibe (ZETIA) 10 MG tablet TAKE 1 TABLET BY MOUTH EVERY DAY 30 tablet 0  . famotidine (PEPCID) 20 MG tablet Take 20 mg by mouth daily.    . fenofibrate 160 MG tablet Take 1 tablet (160 mg total) by mouth daily. 30 tablet 2  . nitroGLYCERIN (NITROSTAT) 0.4 MG SL tablet Place 1 tablet (0.4 mg total) under the tongue every 5 (five) minutes as needed for chest pain. 25 tablet 3  . pantoprazole (PROTONIX) 40 MG tablet TAKE 1 TABLET BY MOUTH EVERY DAY NEEDS APPT FOR REFILLS 90 tablet 2  . tamsulosin (FLOMAX) 0.4 MG CAPS capsule TAKE 1 CAPSULE BY MOUTH DAILY AFTER SUPPER. (Patient taking differently: Take 0.4 mg by mouth. Take 1 tablet twice a day  by mouth.) 90 capsule 1  . telmisartan (MICARDIS) 80 MG tablet Take 1 tablet (80 mg total) by mouth daily. 90 tablet 3  . traMADol (ULTRAM) 50 MG tablet Take 50 mg by mouth every 6 (six) hours as needed.    [name] dextromethorphan-guaiFENesin (MUCINEX DM) 30-600 MG 12hr tablet Take 1 tablet by mouth 2 (two) times daily as needed for cough.    . nebivolol (BYSTOLIC) 10 MG tablet TAKE 1 TABLET BY MOUTH EVERY DAY 35 tablet 0   No current facility-administered medications on file prior to visit.    Allergies  Allergen Reactions  . Penicillins Hives    Has patient had a PCN reaction causing  immediate rash, facial/tongue/throat swelling, SOB or lightheadedness with hypotension: No Has patient had a PCN reaction causing severe rash involving mucus membranes or skin necrosis: Yes Has patient had a PCN reaction that required hospitalization: No Has patient had a PCN reaction occurring within the last 10 years: No If all of the above answers are "NO", then may proceed with Cephalosporin use. Ceftriaxone Ok  . Doxycycline Rash    Water blisters  . Levaquin [Levofloxacin In D5w] Rash    Past Medical History:  Diagnosis Date  . CAD (coronary artery disease), native coronary artery    09/09/16 PCI/DES x3 to mRCA, OM1 and dLcx/OM2.   . Coronary disease    Status post stenting of the left circumflex coronary in 2009 with a bare-metal stent (with a 3.5x26mm Liberte stent)  . Dyslipidemia   . 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 Hyperlipidemia   . Hypertension   . NSTEMI (non-ST elevated myocardial infarction) ([place]) 2009   BMS CFX  . Stroke ([place])   . TIA (transient ischemic attack)    history of tia  . Urinary tract infection     Past Surgical History:  Procedure Laterality Date  . CARDIOVASCULAR STRESS TEST  10-03-08   EF 59%  . CORONARY ANGIOPLASTY WITH STENT PLACEMENT  09/09/2016  . CORONARY STENT INTERVENTION N/A 09/09/2016   Procedure: CORONARY STENT INTERVENTION;  Surgeon: [name], Jasmane Brockway M, MD;  Location: [facility];  Service: Cardiovascular;  Laterality: N/A;  . LEFT HEART CATH AND CORONARY ANGIOGRAPHY N/A 09/09/2016   Procedure: LEFT HEART CATH AND CORONARY ANGIOGRAPHY;  Surgeon: [name], Kivon Aprea M, MD;  Location: [facility];  Service: Cardiovascular;  Laterality: N/A;  . US ECHOCARDIOGRAPHY  09-21-08   EF 55-60%    Social History   Tobacco Use  Smoking Status Never Smoker  Smokeless Tobacco Never Used    Social History   Substance and Sexual Activity  Alcohol Use Yes   Comment: Rarely     Family History  Problem Relation Age of Onset  . Diabetes Mother   .  CAD Sister 37       MI, obese  . Cancer Brother        stomach    Review of Systems: As noted in history of present illness.  All other systems were reviewed and are negative.  Physical Exam: BP 112/64 (BP Location: Left Arm, Patient Position: Sitting)   Pulse 70   Ht 6' (1.829 m)   Wt 206 lb 3.2 oz (93.5 kg)   SpO2 95%   BMI 27.97 kg/m  GENERAL:  Well appearing WM in NAD HEENT:  PERRL, EOMI, sclera are clear. Oropharynx is clear. NECK:  No jugular venous distention, carotid upstroke brisk and symmetric, no bruits, no thyromegaly or adenopathy LUNGS:  Clear to auscultation  bilaterally CHEST:  Unremarkable HEART:  RRR,  PMI not displaced or sustained,S1 and S2 within normal limits, no S3, no S4: no clicks, no rubs, no murmurs ABD:  Soft, nontender. BS +, no masses or bruits. No hepatomegaly, no splenomegaly EXT:  2 + pulses throughout, no edema, no cyanosis no clubbing SKIN:  Warm and dry.  No rashes NEURO:  Alert and oriented x 3. Cranial nerves II through XII intact. PSYCH:  Cognitively intact      LABORATORY DATA:   Lab Results  Component Value Date   WBC 5.8 03/13/2020   HGB 10.9 (L) 03/13/2020   HCT 32.4 (L) 03/13/2020   PLT 121.0 (L) 03/13/2020   GLUCOSE 113 (H) 03/13/2020   CHOL 156 03/30/2019   TRIG 253 (H) 03/30/2019   HDL 21 (L) 03/30/2019   LDLDIRECT 74.0 12/17/2014   LDLCALC 92 03/30/2019   ALT 24 05/25/2019   AST 30 05/25/2019   NA 137 03/13/2020   K 4.6 03/13/2020   CL 105 03/13/2020   CREATININE 1.36 03/13/2020   BUN 29 (H) 03/13/2020   CO2 28 03/13/2020   TSH 2.20 03/13/2020   PSA 2.05 06/01/2019   INR 0.9 09/08/2016   HGBA1C 5.1 10/12/2016    Dated 05/27/17: cholesterol 151, triglycerides 129, HDL 57, LDL 69. CBC, TSH, CMET, PSA all normal.  Ecg today shows NSR normal Ecg. Rate 70. I have personally reviewed and interpreted this study.    Myoview 09/03/16: Study Highlights     The left ventricular ejection fraction is moderately  decreased (30-44%).  Nuclear stress EF: 37%.  There was no ST segment deviation noted during stress.  This is an intermediate risk study.   1. EF 37%, diffuse hypokinesis.  2. Partially reversible medium-sized, moderate intensity basal inferior and inferoseptal and mid inferior perfusion defect. This suggests prior infarction with significant peri-infarct ischemia.   Intermediate risk study.     Cardiac cath/PCI 09/09/16: Procedures   CORONARY STENT INTERVENTION  LEFT HEART CATH AND CORONARY ANGIOGRAPHY  Conclusion     Mid LAD to Dist LAD lesion, 30 %stenosed.  Prox Cx lesion, 25 %stenosed.  Mid RCA lesion, 95 %stenosed.  A STENT SIERRA 3.50 X 23 MM drug eluting stent was successfully placed.  Post intervention, there is a 0% residual stenosis.  Mid Cx lesion, 70 %stenosed.  A STENT SIERRA 2.75 X 12 MM drug eluting stent was successfully placed.  Post intervention, there is a 0% residual stenosis.  Ost 1st Mrg lesion, 90 %stenosed.  A STENT SIERRA 3.00 X 12 MM drug eluting stent was successfully placed.  Post intervention, there is a 0% residual stenosis.   1. Severe 2 vessel obstructive CAD    - 90% ostial OM1    - 70% distal LCx/OM2    - 95% mid RCA 2. Successful stenting of the mid RCA with DES 3. Successful stenting of the ostium of OM1 with DES 4. Successful stenting of the distal LCx/OM2  Plan: DAPT for at least one year. Anticipate DC in am.       Coronary Diagrams   Diagnostic Diagram       Post-Intervention Diagram          Echo 09/16/16: Study Conclusions  - Left ventricle: The cavity size was normal. Wall thickness was   increased in a pattern of severe LVH. Systolic function was   normal. The estimated ejection fraction was in the range of 55%   to 60%. Wall motion was normal; there  were no regional wall   motion abnormalities. Doppler parameters are consistent with   abnormal left ventricular relaxation (grade 1 diastolic    dysfunction). - Aortic valve: Trileaflet; moderately calcified leaflets.   Sclerosis without stenosis. - Aorta: Mildly dilated aortic root. Aortic root dimension: 38 mm   (ED). - Mitral valve: Mildly calcified annulus. There was no significant   regurgitation. - Right ventricle: The cavity size was normal. Systolic function   was normal. - Tricuspid valve: Peak RV-RA gradient (S): 22 mm Hg. - Pulmonary arteries: PA peak pressure: 25 mm Hg (S). - Inferior vena cava: The vessel was normal in size. The   respirophasic diameter changes were in the normal range (= 50%),   consistent with normal central venous pressure.  Impressions:  - Normal LV size with severe LV hypertrophy. EF 55-60%. Normal RV   size and systolic function. Aortic valve sclerosis without   significant stenosis.  Echo 09/16/16: Study Conclusions  - Left ventricle: The cavity size was normal. Wall thickness was   increased in a pattern of severe LVH. Systolic function was   normal. The estimated ejection fraction was in the range of 55%   to 60%. Wall motion was normal; there were no regional wall   motion abnormalities. Doppler parameters are consistent with   abnormal left ventricular relaxation (grade 1 diastolic   dysfunction). - Aortic valve: Trileaflet; moderately calcified leaflets.   Sclerosis without stenosis. - Aorta: Mildly dilated aortic root. Aortic root dimension: 38 mm   (ED). - Mitral valve: Mildly calcified annulus. There was no significant   regurgitation. - Right ventricle: The cavity size was normal. Systolic function   was normal. - Tricuspid valve: Peak RV-RA gradient (S): 22 mm Hg. - Pulmonary arteries: PA peak pressure: 25 mm Hg (S). - Inferior vena cava: The vessel was normal in size. The   respirophasic diameter changes were in the normal range (= 50%),   consistent with normal central venous pressure.  Impressions:  - Normal LV size with severe LV hypertrophy. EF 55-60%.  Normal RV   size and systolic function. Aortic valve sclerosis without   significant stenosis.  Assessment / Plan: 1. Coronary disease with prior stenting of the left circumflex coronary in 2009 with a bare-metal stent. S/p stenting of the first OM, distal LCx and mid RCA with DES in August 2018.  He has no significant angina.  Continue DAPT with ASA and Plavix. Given extensive amount of stenting he has had done I would favor continuing DAPT indefinitely unless he has bleeding issues.     2. 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 No CHF. BP is well controlled on Bystolic and lisinopril.   3. Dyslipidemia. Lipitor dose reduced to 40 mg daily. Also on Zetia and fenofibrate. Will update lab work today and repeat CK as well.  4. 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 S/p CVA.    I will follow up in one year.

## 2020-04-18 ENCOUNTER — Ambulatory Visit: Payer: Medicare Other | Admitting: Cardiology

## 2020-04-18 ENCOUNTER — Other Ambulatory Visit: Payer: Self-pay

## 2020-04-18 ENCOUNTER — Encounter: Payer: Self-pay | Admitting: Cardiology

## 2020-04-18 VITALS — BP 112/64 | HR 70 | Ht 72.0 in | Wt 206.2 lb

## 2020-04-18 DIAGNOSIS — I10 Essential (primary) hypertension: Secondary | ICD-10-CM

## 2020-04-18 DIAGNOSIS — I2510 Atherosclerotic heart disease of native coronary artery without angina pectoris: Secondary | ICD-10-CM

## 2020-04-18 DIAGNOSIS — E785 Hyperlipidemia, unspecified: Secondary | ICD-10-CM

## 2020-04-19 ENCOUNTER — Other Ambulatory Visit: Payer: Self-pay

## 2020-04-19 DIAGNOSIS — R7989 Other specified abnormal findings of blood chemistry: Secondary | ICD-10-CM

## 2020-04-19 DIAGNOSIS — I10 Essential (primary) hypertension: Secondary | ICD-10-CM

## 2020-04-19 LAB — BASIC METABOLIC PANEL
BUN/Creatinine Ratio: 17 (ref 10–24)
BUN: 38 mg/dL — ABNORMAL HIGH (ref 8–27)
CO2: 21 mmol/L (ref 20–29)
Calcium: 9.3 mg/dL (ref 8.6–10.2)
Chloride: 101 mmol/L (ref 96–106)
Creatinine, Ser: 2.27 mg/dL — ABNORMAL HIGH (ref 0.76–1.27)
Glucose: 96 mg/dL (ref 65–99)
Potassium: 4.9 mmol/L (ref 3.5–5.2)
Sodium: 141 mmol/L (ref 134–144)
eGFR: 28 mL/min/{1.73_m2} — ABNORMAL LOW (ref >59)

## 2020-04-19 LAB — HEPATIC FUNCTION PANEL
ALT: 23 IU/L (ref 0–44)
AST: 27 IU/L (ref 0–40)
Albumin: 4.1 g/dL (ref 3.7–4.7)
Alkaline Phosphatase: 45 IU/L (ref 44–121)
Bilirubin Total: 0.6 mg/dL (ref 0.0–1.2)
Bilirubin, Direct: 0.23 mg/dL (ref 0.00–0.40)
Total Protein: 6.2 g/dL (ref 6.0–8.5)

## 2020-04-19 LAB — LIPID PANEL
Chol/HDL Ratio: 5.4 ratio — ABNORMAL HIGH (ref 0.0–5.0)
Cholesterol, Total: 146 mg/dL (ref 100–199)
HDL: 27 mg/dL — ABNORMAL LOW (ref >39)
LDL Chol Calc (NIH): 85 mg/dL (ref 0–99)
Triglycerides: 196 mg/dL — ABNORMAL HIGH (ref 0–149)
VLDL Cholesterol Cal: 34 mg/dL (ref 5–40)

## 2020-04-19 LAB — CK: Total CK: 78 U/L (ref 41–331)

## 2020-04-29 ENCOUNTER — Other Ambulatory Visit: Payer: Self-pay | Admitting: Cardiology

## 2020-05-06 DIAGNOSIS — I10 Essential (primary) hypertension: Secondary | ICD-10-CM | POA: Diagnosis not present

## 2020-05-06 DIAGNOSIS — R7989 Other specified abnormal findings of blood chemistry: Secondary | ICD-10-CM | POA: Diagnosis not present

## 2020-05-06 LAB — BASIC METABOLIC PANEL
BUN/Creatinine Ratio: 18 (ref 10–24)
BUN: 23 mg/dL (ref 8–27)
CO2: 21 mmol/L (ref 20–29)
Calcium: 9.1 mg/dL (ref 8.6–10.2)
Chloride: 105 mmol/L (ref 96–106)
Creatinine, Ser: 1.28 mg/dL — ABNORMAL HIGH (ref 0.76–1.27)
Glucose: 105 mg/dL — ABNORMAL HIGH (ref 65–99)
Potassium: 5 mmol/L (ref 3.5–5.2)
Sodium: 139 mmol/L (ref 134–144)
eGFR: 57 mL/min/{1.73_m2} — ABNORMAL LOW (ref >59)

## 2020-05-14 ENCOUNTER — Other Ambulatory Visit: Payer: Self-pay | Admitting: Cardiology

## 2020-05-20 ENCOUNTER — Ambulatory Visit: Payer: Medicare Other | Admitting: Internal Medicine

## 2020-05-20 ENCOUNTER — Encounter: Payer: Self-pay | Admitting: Internal Medicine

## 2020-05-20 ENCOUNTER — Other Ambulatory Visit: Payer: Self-pay

## 2020-05-20 DIAGNOSIS — R911 Solitary pulmonary nodule: Secondary | ICD-10-CM | POA: Diagnosis not present

## 2020-05-20 DIAGNOSIS — R058 Other specified cough: Secondary | ICD-10-CM | POA: Diagnosis not present

## 2020-05-20 NOTE — Patient Instructions (Addendum)
Stop pantoprazole and just pepcid 20 mg twice daily after bfast and supper x one weeks then just take night time dose x one week and then stop and if cough flares then that would suggest this is a reflux problem and we can do GI referral or let [name] manage the problem   If you are satisfied with your treatment plan,  let your doctor know and he/she can either refill your medications or you can return here when your prescription runs out.     If in any way you are not 100% satisfied,  please tell us.  If 100% better, tell your friends!  Pulmonary follow up is as needed

## 2020-05-20 NOTE — Progress Notes (Signed)
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 Pulmonary Consult    Referred by Dr. Danise Mina for eval of lung mass. Pt states had PNA recently and today his breathing is at baseline for him. He states before his PNA he painted a car and he wore a mask, but did not realize there was a hole in it. 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   onset of symptoms was abrupt one day p painting car with new acetone based paint then w/in 24h L posterolateral pleuritic cp/nasal congestion with epistaxis and hemoptysis >  eval 04/20/16 with cxr /CT? pna neg PE >rx Doxy x 10 DAYS > ALL BETTER symptomatically in terms of the cp/ hemoptysis  Still has a little bloody nasal discharge  Has h/o TB exp but took INH x 1.5 years  X decades prior to OV   rec Take the fish oil if you must with breakfast  Or stop it and eat more baked/grilled fish (especially salmon)  to help you lose weight by replacing the calories in the fish oil  And unhealthy foods with lower calorie alternatives (like salmon)  Call if nosebleeds continue and you may need a sinus CT before next visit - hold aspirin if gets worse     06/19/2016  f/u ov/[name] re: spn LLL /hbp Chief Complaint  Patient presents with  . Follow-up    CXR repeated today. He states "my breathing is fine". No new co's today. He is down 2 lbs since last ov 05/21/16.   ha is worse in pm's variable seems worse in pm  L cp has not recurred/ no sob or cough rec bystolic 10 mg daily until you see your primary care doctor w/in next couple of weeks Please  see patient coordinator before you leave today  to schedule PET scan - neg           12/30/2016  f/u ov/Colin Lopez re: "worse cold I've ever had" p flu/pna shots in oct 2018  Chief Complaint  Patient presents with  . Follow-up    Pt c/o cough, non prod that started after he had flu and pna vaccine 10/2016.  Cough does occ wake him up in the night.   coughing fits can be severe day > noct / paroxysmal s pattern and completely dry but feels like he's "finally getting over his cold"  Not limited by breathing from desired activities   Sleeping ok rec We will call you to schedule the CT chest in 04/2017   Late add: move up ct to prior to 02/2017 > did not do     02/22/2018  f/u ov/Colin Lopez re:  spn LLL / chronic cough on acei  Chief Complaint  Patient presents with  . Acute Visit    Pt c/o left chest soreness since had a fall on 02/17/2018.  Dyspnea:  MMRC1 = can walk nl pace, flat grade, can't hurry or go uphills or steps s sob  Cough: feels like getting choked  Sleeping: fine bed flat 4 pillows  SABA use: none  02: none  rec Stop prinivil (lisinopril) and start micardis 80  mg one daily  - if too strong break it in half     03/13/2020  Acute ov/Colin Lopez re: uacs/ spn   / already zpak and tessalon since  02/13/20 no better Chief Complaint  Patient presents with  . Acute Visit    C/o cough sticky white and sob x 59mths, wheezing occass.  Dyspnea: limited by legs aching (calves) /back pain  s radicular features  Cough: flared p exp to sick fm members, did not test for covid last week  With thick white mucus Sleeping: flat bed, lots of pillows or recliner or can't sleep s cough  SABA use: none  02: none Covid status:   Unvaccinated  With urinary problems since admit cone> to see Manny 02/15/20 rec Pantoprazole (protonix) 40 mg   Take  30-60 min before first meal of the day and Pepcid (famotidine)  20 mg one after supper  until return to office - this is the best way to tell whether stomach acid is  contributing to your problem.   Stop all fish oil and the lipitor for 2 weeks  - if aches improve, start back on one half of the lipitor  Prednisone 10 mg take  4 each am x 2 days,   2 each am x 2 days,  1 each am x 2 days and stop  For cough > mucinex dm 1200 mg every 12 hours and supplement if still coughing supplement tramadol 50 mg every 4 hours  AFTER you see Dr Tresa Moore  >>> for  drainage / throat tickle try take CHLORPHENIRAMINE  4 mg  (Chlortab 4mg  ) Please schedule a follow up office visit in 2 weeks, sooner if needed  with all medications /inhalers/ solutions in hand so we can verify exactly what you are taking. This includes all medications from all doctors and over the counters     03/29/2020  f/u ov/Colin Lopez re:  Cyclical cough/ unexplained sob/ calf pain ? lipitor related  No chief complaint on file. 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 Helping calf pain?  rec Continue acid Pantoprazole (protonix) 40 mg   Take  30-60 min before first meal of the day and Pepcid (famotidine)  20 mg one @  Supper  until return to office - this is the best way to tell whether stomach acid is contributing to your problem. Try to wean off the tramadol to see what if any symptoms flare      05/20/2020  f/u ov/Colin Lopez re:  Recurrent cough c/w UACS Chief Complaint  Patient presents with  . Follow-up    Breathing has improved since the last visit.    Dyspnea: no limitations  Cough: no cough  Sleeping: in bed /2pillows  SABA use: none  02: no Covid status:   No vaccinated    No obvious day to day or daytime variability or assoc excess/ purulent sputum or mucus plugs or hemoptysis or cp or chest tightness, subjective wheeze or overt sinus or hb symptoms.   Sleeping as aboe without nocturnal   or early am  exacerbation  of respiratory  c/o's or need for noct saba. Also denies any obvious fluctuation of symptoms with weather or environmental changes or other aggravating or alleviating factors except as outlined above   No unusual exposure hx or h/o childhood pna/ asthma or knowledge of premature birth.  Current Allergies, Complete Past Medical History, Past Surgical History, Family History, and Social History were reviewed in [employer] record.  ROS  The following are not active complaints unless bolded Hoarseness, sore throat, dysphagia, dental problems, itching, sneezing,  nasal congestion or discharge of excess mucus or purulent secretions, ear ache,   fever, chills, sweats, unintended wt loss or wt gain, classically pleuritic or exertional cp,  orthopnea pnd or arm/hand swelling  or leg swelling, presyncope, palpitations, abdominal pain, anorexia, nausea, vomiting, diarrhea  or change in bowel habits or change in bladder habits, change in stools or change in urine, dysuria, hematuria,  rash, arthralgias, visual complaints, headache, numbness, weakness or ataxia or problems with walking or coordination,  change in mood or  memory.        Current Meds  Medication Sig  . acetaminophen (TYLENOL) 500 MG tablet Take 1,000 mg by mouth every 6 (six) hours as needed for headache (pain).  [name] aspirin EC 81 MG tablet Take 1 tablet (81 mg total) by mouth daily.  [name] atorvastatin (LIPITOR) 40 MG tablet Take 1 tablet (40 mg total) by mouth daily.  . clopidogrel (PLAVIX) 75 MG tablet Take 1 tablet by mouth once daily with breakfast. Keep upcoming appointment  . ezetimibe (ZETIA) 10 MG tablet TAKE 1 TABLET BY MOUTH EVERY DAY  . fenofibrate 160 MG tablet Take 1 tablet (160 mg total) by mouth daily.  . nebivolol (BYSTOLIC) 10 MG tablet TAKE 1 TABLET BY MOUTH EVERY DAY  . nitroGLYCERIN (NITROSTAT) 0.4 MG SL tablet Place 1 tablet (0.4 mg total) under the tongue every 5 (five) minutes  as needed for chest pain.  . pantoprazole (PROTONIX) 40 MG tablet TAKE 1 TABLET BY MOUTH EVERY DAY NEEDS APPT FOR REFILLS  . tamsulosin (FLOMAX) 0.4 MG CAPS capsule TAKE 1 CAPSULE BY MOUTH DAILY AFTER SUPPER. (Patient taking differently: Take 0.4 mg by mouth. Take 1 tablet twice a day by mouth.)  . traMADol (ULTRAM) 50 MG tablet Take 50 mg by mouth every 6 (six) hours as needed.                 Objective:   Physical Exam   05/20/2020         211 03/29/2020       203 03/13/2020          207 02/22/2018        215  12/30/2016      207  06/19/2016          209  05/21/16 211 lb (95.7 kg)  05/05/16 214 lb (97.1 kg)  04/20/16 212 lb 4 oz (96.3 kg)     Vital signs reviewed  05/20/2020  - Note at rest 02 sats  95% on RA   General appearance:    amb wm nad      Reports:  Edentulous     HEENT : pt wearing mask not removed for exam due to covid -19 concerns.    NECK :  without JVD/Nodes/TM/ nl carotid upstrokes bilaterally   LUNGS: no acc muscle use,  Nl contour chest which is clear to A and P bilaterally without cough on insp or exp maneuvers  CV:  RRR  no s3 or murmur or increase in P2, and no edema   ABD:  soft and nontender with nl inspiratory excursion in the supine position. No bruits or organomegaly appreciated, bowel sounds nl  MS:  Nl gait/ ext warm without deformities, calf tenderness, cyanosis or clubbing No obvious joint restrictions   SKIN: warm and dry without lesions    NEURO:  alert, approp, nl sensorium with  no motor or cerebellar deficits apparent.                  Assessment:

## 2020-05-21 ENCOUNTER — Encounter: Payer: Self-pay | Admitting: Internal Medicine

## 2020-05-21 NOTE — Assessment & Plan Note (Signed)
Developed on ACEi p flu/pna shots 10/2016  - persistent sense of globus reported 02/22/2018 rec trial off acei >  Resolved - recurrent p uri from fm member in early Jan 2022 / not tested for covid until late Feb 2022 neg  - CTa chest 03/15/20 neg - resolved 03/29/2020 on gerd rx/ short course prednisone so rec continue gerd rx  - try taper off gerd rx 05/20/2020 and f/u PCP/GI prn   NB the  ramp to expected improvement in symptoms from an empiric trial of PPI (and for that matter, worsening, if a chronic effective medication is stopped)  can be measured in weeks, not days, a common misconception because this is not the same as treating heartburn (no immediate cause and effect relationship)  so that response to therapy or lack thereof can be very difficult to assess especially if the patient is not adherent to the treatment plan which includes dietary restrictions.   rec  Diet/ taper off gerd rx/ f/u prn           Each maintenance medication was reviewed in detail including emphasizing most importantly the difference between maintenance and prns and under what circumstances the prns are to be triggered using an action plan format where appropriate.  Total time for H and P, chart review, counseling, reviewing  and generating customized AVS unique to this office visit / same day charting = 15 min

## 2020-05-21 NOTE — Assessment & Plan Note (Signed)
Never smoker  CTa Chest  04/21/16 1. No evidence of acute pulmonary embolus. 2. Superior segment left lower lobe rounded 2.4 cm opacity is nonspecific and corresponds to the radiographic finding one day prior - cxr 06/19/2016 more dense, localized > rec PET 06/30/2016 >>> althhough there is a persistent subpleural nodule in the left lower lobe, this demonstrates only low level hypermetabolic activity, predominately along the pleural surface. A benign etiology such as rounded atelectasis is favored, although low grade neoplasm cannot be completely excluded > rec ov with cxr in 3 months  - cxr 09/30/2016 c/w regression - only seen on PA view with rib superimposed making it look denser - 12/30/2016 no change > rec CT by 02/12/17 > did not do  - 02/22/2018 cxr increasing nodularity > rec CT s contrast 02/24/2018 >>> 1. Grossly unchanged subpleural left lower lobe pulmonary nodule, potentially representing rounded atelectasis > rec ov one year as lesion stable  x 2 years and he is low risk  - cxr 03/13/2020 shows growth > CT chest  03/15/20  >>> no change, no further studies needed 

## 2020-05-23 ENCOUNTER — Other Ambulatory Visit: Payer: Self-pay | Admitting: Cardiology

## 2020-06-07 ENCOUNTER — Other Ambulatory Visit: Payer: Self-pay | Admitting: Cardiology

## 2020-06-11 ENCOUNTER — Other Ambulatory Visit: Payer: Self-pay | Admitting: Cardiology

## 2020-10-08 ENCOUNTER — Telehealth: Payer: Self-pay | Admitting: Cardiology

## 2020-10-08 MED ORDER — ATORVASTATIN CALCIUM 40 MG PO TABS: 40.0000 mg | ORAL_TABLET | Freq: Every day | ORAL | 3 refills | Status: DC

## 2020-10-08 NOTE — Telephone Encounter (Signed)
*  STAT* If patient is at the pharmacy, call can be transferred to refill team.   1. Which medications need to be refilled? (please list name of each medication and dose if known)  atorvastatin (LIPITOR) 40 MG tablet (Expired)  2. Which pharmacy/location (including street and city if local pharmacy) is medication to be sent to?  [id number] [place], [place] - [address]  3. Do they need a 30 day or 90 day supply? [place]

## 2020-10-08 NOTE — Telephone Encounter (Signed)
Medication sent to pharmacy  

## 2021-02-04 DIAGNOSIS — X32XXXA Exposure to sunlight, initial encounter: Secondary | ICD-10-CM | POA: Diagnosis not present

## 2021-02-04 DIAGNOSIS — L570 Actinic keratosis: Secondary | ICD-10-CM | POA: Diagnosis not present

## 2021-02-04 DIAGNOSIS — C44229 Squamous cell carcinoma of skin of left ear and external auricular canal: Secondary | ICD-10-CM | POA: Diagnosis not present

## 2021-02-04 DIAGNOSIS — L820 Inflamed seborrheic keratosis: Secondary | ICD-10-CM | POA: Diagnosis not present

## 2021-02-04 DIAGNOSIS — C44219 Basal cell carcinoma of skin of left ear and external auricular canal: Secondary | ICD-10-CM | POA: Diagnosis not present

## 2021-02-17 ENCOUNTER — Other Ambulatory Visit: Payer: Self-pay | Admitting: Cardiology

## 2021-03-17 ENCOUNTER — Telehealth: Payer: Self-pay | Admitting: Cardiology

## 2021-03-17 NOTE — Telephone Encounter (Signed)
New Message: ? ? ?Wife called and said patient started on Thursday with a runny nose, congested in his head and some in his chest. He started coughing over the weekend. Patient primary doctor left the practice, at this time does not have a primary doctor. She wanted to know if Dr [name] could call something in for him,he/him/his does not want it to get worse. ?

## 2021-03-17 NOTE — Telephone Encounter (Signed)
Spoke to patient's wife Dr.Jordan is out of office this week.Advised to call PCP.1 year follow up appointment scheduled with Dr.Jordan 04/14/21 at 3:40 pm. ?

## 2021-04-08 NOTE — Progress Notes (Signed)
? ?[name] ?Date of Birth: 1940/04/28 ?Medical Record #[id number] ? ?History of Present Illness: ?Mr. Klostermann is seen for follow up CAD.  He has a history of coronary disease with a non-ST elevation myocardial infarction in 2009. He underwent stenting of the left circumflex coronary with a 3.5 x 16 mm Liberte stent. He does have a history of hypertension and hyperlipidemia. He is intolerant to Crestor.  ?He was seen in February 2018 with some atypical left shoulder and neck pain.  A Myoview study was ordered but was never done. He had a CT chest  which was negative for PE. There was a rounded density in the LLL of unclear etiology. Later PET scan in June showed low metabolic activity and low risk.   ? ?He underwent a Lexiscan myoview study which showed multiple perfusion defects and EF 37%. This led to a cardiac cath in late August 2018  that showed 3 vessel disease with successful management with DES x 3 to OM1, distal LCx, and mid RCA.  ? ?He was admitted in late September 2018. He presented with headache, blurred vision, generalized weakness, dizziness.  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  CT angiogram of the head and neck showed both ICA widely patent, atherosclerotic disease of the supraclinoid internal carotid arteries. No intracranial branch vessel disease.  2-D echo on 09/16/16 showed EF of 55-60% with grade 1 diastolic dysfunction, no wall motion abnormalities. Neurology consulted, continue aspirin, Plavix.  ? ?He was admitted in May 2021 with AKI and urinary infection. ? ?On follow up today he is doing well. Notes some SOB due to "asthma". No chest pain, dizziness or palpitations. Tolerating medication well. Had elevated CPK in past that resolved with reduction in lipitor dose. Is active building old cars and deer hunting in the fall.  ? ?Current Outpatient Medications on File Prior  to Visit  ?Medication Sig Dispense Refill  ? acetaminophen (TYLENOL) 500 MG tablet Take 1,000 mg by mouth every 6 (six) hours as needed for headache (pain).    ? aspirin EC 81 MG tablet Take 1 tablet (81 mg total) by mouth daily. 90 tablet 3  ? atorvastatin (LIPITOR) 40 MG tablet Take 1 tablet (40 mg total) by mouth daily. 90 tablet 3  ? clopidogrel (PLAVIX) 75 MG tablet TAKE 1 TABLET BY MOUTH EVERY DAY WITH BREAKFAST MUST KEEP APPT 90 tablet 3  ? ezetimibe (ZETIA) 10 MG tablet TAKE 1 TABLET BY MOUTH EVERY DAY 90 tablet 3  ? fenofibrate 160 MG tablet TAKE 1 TABLET BY MOUTH EVERY DAY 90 tablet 3  ? KRILL OIL PO Take by mouth 2 (two) times daily.    ? nebivolol (BYSTOLIC) 10 MG tablet TAKE 1 TABLET BY MOUTH EVERY DAY 90 tablet 3  ? pantoprazole (PROTONIX) 40 MG tablet TAKE 1 TABLET BY MOUTH EVERY DAY 90 tablet 2  ? tamsulosin (FLOMAX) 0.4 MG CAPS capsule TAKE 1 CAPSULE BY MOUTH DAILY AFTER SUPPER. (Patient taking differently: Take 0.4 mg by mouth. Take 1 tablet twice a day by mouth.) 90 capsule 1  ? nitroGLYCERIN (NITROSTAT) 0.4 MG SL tablet Place 1 tablet (0.4 mg total) under the tongue every 5 (five) minutes as needed for chest pain. (Patient taking differently: Place 0.4 mg under the tongue every 5 (five) minutes as needed for chest pain. Patient has never had to use.) 25 tablet 3  ? traMADol (  ULTRAM) 50 MG tablet Take 50 mg by mouth every 6 (six) hours as needed. (Patient not taking: Reported on 04/14/2021)    ? ?No current facility-administered medications on file prior to visit.  ? ? ?Allergies  ?Allergen Reactions  ? Penicillins Hives  ?  Has patient had a PCN reaction causing immediate rash, facial/tongue/throat swelling, SOB or lightheadedness with hypotension: No ?Has patient had a PCN reaction causing severe rash involving mucus membranes or skin necrosis: Yes ?Has patient had a PCN reaction that required hospitalization: No ?Has patient had a PCN reaction occurring within the last 10 years: No ?If all of the  above answers are "NO", then may proceed with Cephalosporin use. ?Ceftriaxone Ok  ? Doxycycline Rash  ?  Water blisters  ? Levaquin [Levofloxacin In D5w] Rash  ? ? ?Past Medical History:  ?Diagnosis Date  ? CAD (coronary artery disease), native coronary artery   ? 09/09/16 PCI/DES x3 to mRCA, OM1 and dLcx/OM2.   ? Coronary disease   ? Status post stenting of the left circumflex coronary in 2009 with a bare-metal stent (with a 3.5x36m Liberte stent)  ? Dyslipidemia   ? Exposure to TB   ? Hyperlipidemia   ? Hypertension   ? NSTEMI (non-ST elevated myocardial infarction) ([facility] 2009  ? BMS CFX  ? Stroke ([facility]   ? TIA (transient ischemic attack)   ? history of tia  ? Urinary tract infection   ? ? ?Past Surgical History:  ?Procedure Laterality Date  ? CARDIOVASCULAR STRESS TEST  10-03-08  ? EF 59%  ? CORONARY ANGIOPLASTY WITH STENT PLACEMENT  09/09/2016  ? CORONARY STENT INTERVENTION N/A 09/09/2016  ? Procedure: CORONARY STENT INTERVENTION;  Surgeon: JMartinique Michi Herrmann M, MD;  [facility];  Service: Cardiovascular;  Laterality: N/A;  ? LEFT HEART CATH AND CORONARY ANGIOGRAPHY N/A 09/09/2016  ? Procedure: LEFT HEART CATH AND CORONARY ANGIOGRAPHY;  Surgeon: JMartinique Raider Valbuena M, MD;  [facility];  Service: Cardiovascular;  Laterality: N/A;  ? [place]ARDIOGRAPHY  09-21-08  ? EF 55-60%  ? ? ?Social History  ? ?Tobacco Use  ?Smoking Status Never  ?Smokeless Tobacco Never  ? ? ?Social History  ? ?Substance and Sexual Activity  ?Alcohol Use Yes  ? Comment: Rarely   ? ? ?Family History  ?Problem Relation Age of Onset  ? Diabetes Mother   ? CAD Sister 41 ?     MI, obese  ? Cancer Brother   ?     stomach  ? ? ?Review of Systems: ?As noted in history of present illness.  All other systems were reviewed and are negative. ? ?Physical Exam: ?BP 136/82   Pulse 62   Ht '6\' 1"'$  (1.854 m)   Wt 210 lb 9.6 oz (95.5 kg)   SpO2 97%   BMI 27.79 kg/m?  ?GENERAL:  Well appearing WM in NAD ?HEENT:  PERRL, EOMI, sclera are  clear. Oropharynx is clear. ?NECK:  No jugular venous distention, carotid upstroke brisk and symmetric, no bruits, no thyromegaly or adenopathy ?LUNGS:  Clear to auscultation bilaterally ?CHEST:  Unremarkable ?HEART:  RRR,  PMI not displaced or sustained,S1 and S2 within normal limits, no S3, no S4: no clicks, no rubs, no murmurs ?ABD:  Soft, nontender. BS +, no masses or bruits. No hepatomegaly, no splenomegaly ?EXT:  2 + pulses throughout, no edema, no cyanosis no clubbing ?SKIN:  Warm and dry.  No rashes ?NEURO:  Alert and oriented x 3. Cranial  nerves II through XII intact. ?PSYCH:  Cognitively intact ? ? ? ? ? ?LABORATORY DATA: ? ? ?Lab Results  ?Component Value Date  ? WBC 5.8 03/13/2020  ? HGB 10.9 (L) 03/13/2020  ? HCT 32.4 (L) 03/13/2020  ? PLT 121.0 (L) 03/13/2020  ? GLUCOSE 105 (H) 05/06/2020  ? CHOL 146 04/18/2020  ? TRIG 196 (H) 04/18/2020  ? HDL 27 (L) 04/18/2020  ? LDLDIRECT 74.0 12/17/2014  ? [place] 85 04/18/2020  ? ALT 23 04/18/2020  ? AST 27 04/18/2020  ? NA 139 05/06/2020  ? K 5.0 05/06/2020  ? CL 105 05/06/2020  ? CREATININE 1.28 (H) 05/06/2020  ? BUN 23 05/06/2020  ? CO2 21 05/06/2020  ? TSH 2.20 03/13/2020  ? PSA 2.05 06/01/2019  ? INR 0.9 09/08/2016  ? HGBA1C 5.1 10/12/2016  ? ? ?Dated 05/27/17: cholesterol 151, triglycerides 129, HDL 57, LDL 69. CBC, TSH, CMET, PSA all normal. ? ?Ecg today shows NSR normal Ecg. Rate 62. I have personally reviewed and interpreted this study. ? ? ? ?Myoview 09/03/16: Study Highlights  ? ?  ?The left ventricular ejection fraction is moderately decreased (30-44%). ?Nuclear stress EF: 37%. ?There was no ST segment deviation noted during stress. ?This is an intermediate risk study. ?  ?1. EF 37%, diffuse hypokinesis.  ?2. Partially reversible medium-sized, moderate intensity basal inferior and inferoseptal and mid inferior perfusion defect. This suggests prior infarction with significant peri-infarct ischemia.  ?  ?Intermediate risk study.  ?   ? ?Cardiac cath/PCI  09/09/16: Procedures  ? ?CORONARY STENT INTERVENTION  ?LEFT HEART CATH AND CORONARY ANGIOGRAPHY  ?Conclusion  ? ?  ?Mid LAD to Dist LAD lesion, 30 %stenosed. ?Prox Cx lesion, 25 %stenosed. ?Mid RCA lesion, 95

## 2021-04-14 ENCOUNTER — Ambulatory Visit: Payer: Medicare Other | Admitting: Cardiology

## 2021-04-14 ENCOUNTER — Encounter: Payer: Self-pay | Admitting: Cardiology

## 2021-04-14 VITALS — BP 136/82 | HR 62 | Ht 73.0 in | Wt 210.6 lb

## 2021-04-14 DIAGNOSIS — E785 Hyperlipidemia, unspecified: Secondary | ICD-10-CM | POA: Diagnosis not present

## 2021-04-14 DIAGNOSIS — I10 Essential (primary) hypertension: Secondary | ICD-10-CM | POA: Diagnosis not present

## 2021-04-14 DIAGNOSIS — I2510 Atherosclerotic heart disease of native coronary artery without angina pectoris: Secondary | ICD-10-CM | POA: Diagnosis not present

## 2021-04-15 LAB — LIPID PANEL
Chol/HDL Ratio: 5.1 ratio — ABNORMAL HIGH (ref 0.0–5.0)
Cholesterol, Total: 152 mg/dL (ref 100–199)
HDL: 30 mg/dL — ABNORMAL LOW (ref >39)
LDL Chol Calc (NIH): 90 mg/dL (ref 0–99)
Triglycerides: 182 mg/dL — ABNORMAL HIGH (ref 0–149)
VLDL Cholesterol Cal: 32 mg/dL (ref 5–40)

## 2021-04-15 LAB — BASIC METABOLIC PANEL
BUN/Creatinine Ratio: 19 (ref 10–24)
BUN: 24 mg/dL (ref 8–27)
CO2: 23 mmol/L (ref 20–29)
Calcium: 9.5 mg/dL (ref 8.6–10.2)
Chloride: 102 mmol/L (ref 96–106)
Creatinine, Ser: 1.26 mg/dL (ref 0.76–1.27)
Glucose: 84 mg/dL (ref 70–99)
Potassium: 4.5 mmol/L (ref 3.5–5.2)
Sodium: 139 mmol/L (ref 134–144)
eGFR: 57 mL/min/{1.73_m2} — ABNORMAL LOW (ref >59)

## 2021-04-15 LAB — HEPATIC FUNCTION PANEL
ALT: 17 IU/L (ref 0–44)
AST: 22 IU/L (ref 0–40)
Albumin: 4.4 g/dL (ref 3.6–4.6)
Alkaline Phosphatase: 57 IU/L (ref 44–121)
Bilirubin Total: 0.6 mg/dL (ref 0.0–1.2)
Bilirubin, Direct: 0.26 mg/dL (ref 0.00–0.40)
Total Protein: 6.5 g/dL (ref 6.0–8.5)

## 2021-04-21 ENCOUNTER — Other Ambulatory Visit: Payer: Self-pay

## 2021-04-21 DIAGNOSIS — I2510 Atherosclerotic heart disease of native coronary artery without angina pectoris: Secondary | ICD-10-CM

## 2021-04-21 DIAGNOSIS — E785 Hyperlipidemia, unspecified: Secondary | ICD-10-CM

## 2021-04-21 NOTE — Progress Notes (Signed)
amb  

## 2021-04-23 DIAGNOSIS — Z85828 Personal history of other malignant neoplasm of skin: Secondary | ICD-10-CM | POA: Diagnosis not present

## 2021-04-23 DIAGNOSIS — L570 Actinic keratosis: Secondary | ICD-10-CM | POA: Diagnosis not present

## 2021-04-23 DIAGNOSIS — X32XXXD Exposure to sunlight, subsequent encounter: Secondary | ICD-10-CM | POA: Diagnosis not present

## 2021-04-23 DIAGNOSIS — Z08 Encounter for follow-up examination after completed treatment for malignant neoplasm: Secondary | ICD-10-CM | POA: Diagnosis not present

## 2021-05-08 ENCOUNTER — Ambulatory Visit: Payer: Medicare Other

## 2021-05-14 ENCOUNTER — Other Ambulatory Visit: Payer: Self-pay | Admitting: Cardiology

## 2021-05-16 ENCOUNTER — Other Ambulatory Visit: Payer: Self-pay | Admitting: Cardiology

## 2021-06-10 ENCOUNTER — Encounter: Payer: Self-pay | Admitting: *Deleted

## 2021-06-17 DIAGNOSIS — M5136 Other intervertebral disc degeneration, lumbar region: Secondary | ICD-10-CM | POA: Diagnosis not present

## 2021-06-17 DIAGNOSIS — M5451 Vertebrogenic low back pain: Secondary | ICD-10-CM | POA: Diagnosis not present

## 2021-06-17 DIAGNOSIS — M25511 Pain in right shoulder: Secondary | ICD-10-CM | POA: Diagnosis not present

## 2021-07-31 DIAGNOSIS — M25511 Pain in right shoulder: Secondary | ICD-10-CM | POA: Diagnosis not present

## 2021-08-13 ENCOUNTER — Other Ambulatory Visit: Payer: Self-pay | Admitting: Cardiology

## 2021-08-13 NOTE — Telephone Encounter (Signed)
Rx(s) sent to pharmacy electronically.  

## 2021-11-26 ENCOUNTER — Telehealth: Payer: Self-pay | Admitting: Cardiology

## 2021-11-26 NOTE — Telephone Encounter (Signed)
Called both cell and home numbers.  No answer on home phone. Left message on cell for patient to call 911 for sob and chest pain. Also, left number [phone] if they need to speak with cardiologist on call.

## 2021-11-26 NOTE — Telephone Encounter (Signed)
Pt c/o Shortness Of Breath: STAT if SOB developed within the last 24 hours or pt is noticeably SOB on the phone  1. Are you currently SOB (can you hear that pt is SOB on the phone)?  No  2. How long have you been experiencing SOB?  Past several months per wife   3. Are you SOB when sitting or when up moving around?  When up and moving around   4. Are you currently experiencing any other symptoms?  (Not currently) - Tingling in lips, occasional CP usually with exertion

## 2021-11-27 NOTE — Progress Notes (Unsigned)
Cardiology Office Note:    Date:  11/28/2021  ID:  Colin Lopez, DOB 12/25/1940, MRN [id number]  PCP:  Jearld Fenton, NP   [place] [facility] Cardiologist:  [name] [name], MD     Referring MD: Jearld Fenton, NP   CC: Chest pain and shortness of breath   History of Present Illness:    Colin Lopez is a 81 y.o. male with a hx of the following:  Coronary artery disease, status post NSTEMI in 2009 with past stenting and DES to OM1, distal left Cx, and mid RCA in 2018 Hypertension Dyslipidemia (history of intolerance to rosuvastatin) History of stroke Benign pulmonary nodule (as seen on CT scan in 2022) Dyspnea on exertion  He was evaluated in 2018 with some atypical left shoulder and neck pain, Myoview was ordered but was never done.  CT of the chest was negative for PE.  It was found that there was a rounded density in the left lower lobe of uncertain etiology.  PET scan in June was low risk and showed low metabolic activity.  Later on he underwent Myoview that showed multiple perfusion defects with a EF at 37%.  Heart cath in August 2018 revealed three-vessel disease with successful management with drug-eluting stent x3 to distal left circumflex, OM1, and mid RCA.  Was admitted in September 2018 and presented with blurred vision, headache, dizziness, and generalized weakness.  MRI of brain showed acute/subacute infarct along the right's forceps of splenium of corpus callosum, punctate foci of reduced diffusion in the frontal lobes.  CT angiogram of head and neck revealed widely patent bilateral ICA, atherosclerotic disease of supraclinoid internal carotid arteries.  No intracranial branch disease was noted.  TTE revealed EF 55 to 60%, grade 1 DD, no RWMA.  Neurology was consulted and he was continued on Plavix and aspirin.  Most recently, he was admitted in May 2021 with urinary infection and AKI.  He was last seen by Dr. [name] on April 14, 2021.  He was reportedly  doing well and noted some shortness of breath that he attributed to asthma, denied any chest pain, palpitations, or dizziness.  Was staying pretty active at the time.  Was told to follow-up in 1 year.  Most recently he contacted our nurse triage line on November 26, 2021 reporting shortness of breath with exertion.  Also noted chest pain usually with exertion and tingling of the lips.  Told the nurse that he was having also pain across shoulders and numbness on the left side of his face that was intermittent for the past 8 to 9 weeks, was currently pain-free at the time of the call with the nurse yesterday.  Today he presents for follow-up evaluation of shortness of breath and chest pain with exertion.  He states he is recently experienced chest pain with exertion, located in the middle of his chest and under left armpit.  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  Patient states alleviating factors are rest and Tylenol.  Nothing seems to make the pain worse.  Describes the pain as a mild heaviness, non-radiating. Had an episode yesterday, typically lasts for about 45 minutes, then goes away. Associated symptom includes shortness of breath with exertion, wife notices this when he goes up and down the stairs, will typically breathe harder, says this has been going on for a while, chronic and stable. Denies any recurring chest pain since yesterday.  Patient also admits to tingling and numbness of left side of mouth and cheek, admits to left lip droop.  Patient states it feels similar to when someone receives Novocain at the dentist office, denies any slurred speech or confusion or any unilateral weakness, has not sought care or ED evaluation for this.  Patient admits to significant stress lately, only sleeping 2 to 3 hours every night as he is up several times per day going to the bathroom, denies any recent sick contacts, fever, chills, nausea, or vomiting.  Also  admits stress to providing care for 19 year old grandson, denies any abuse.  Denies any palpitations, syncope, presyncope, dizziness, orthopnea, PND, swelling, significant weight changes, acute bleeding, or claudication.  Past Medical History:  Diagnosis Date   CAD (coronary artery disease), native coronary artery    09/09/16 PCI/DES x3 to mRCA, OM1 and dLcx/OM2.    Coronary disease    Status post stenting of the left circumflex coronary in 2009 with a bare-metal stent (with a 3.5x46m Liberte stent)   Dyslipidemia    Dyspnea on exertion    Exertional chest pain    Exposure to TB    Hyperlipidemia    Hypertension    Hypertriglyceridemia    [place]   BMS CFX   Numbness and tingling of left side of face    with left lip/face droop (Concern for Bell's Palsy)   Rash and nonspecific skin eruption    Lower right anterior leg   [place]    TIA (transient ischemic attack)    history of tia   Urinary tract infection     Past Surgical History:  Procedure Laterality Date   CARDIOVASCULAR STRESS TEST  10-03-08   EF 59%   CORONARY ANGIOPLASTY WITH STENT PLACEMENT  09/09/2016   CORONARY STENT INTERVENTION N/A 09/09/2016   Procedure: CORONARY STENT INTERVENTION;  Surgeon: JMartinique [name] M, MD;  [facility];  Service: Cardiovascular;  Laterality: N/A;   LEFT HEART CATH AND CORONARY ANGIOGRAPHY N/A 09/09/2016   Procedure: LEFT HEART CATH AND CORONARY ANGIOGRAPHY;  Surgeon: JMartinique [name] M, MD;  [facility];  Service: Cardiovascular;  Laterality: N/A;   [place]GR[place]HY  09-21-08   EF 55-60%    Current Medications: Current Meds  Medication Sig   acetaminophen (TYLENOL) 500 MG tablet Take 1,000 mg by mouth every 6 (six) hours as needed for headache (pain).   aspirin EC 81 MG tablet Take 1 tablet (81 mg total) by mouth daily.   atorvastatin (LIPITOR) 40 MG tablet TAKE 1 TABLET BY MOUTH EVERY DAY    clopidogrel (PLAVIX) 75 MG tablet TAKE 1 TABLET BY MOUTH EVERY DAY WITH BREAKFAST MUST KEEP APPT   ezetimibe (ZETIA) 10 MG tablet TAKE 1 TABLET BY MOUTH EVERY DAY   fenofibrate 160 MG tablet TAKE 1 TABLET BY MOUTH EVERY DAY   KRILL OIL PO Take by mouth 2 (two) times daily.   nebivolol (BYSTOLIC) 10 MG tablet TAKE 1 TABLET BY MOUTH EVERY DAY   nitroGLYCERIN (NITROSTAT) 0.4 MG SL tablet Place 1 tablet (0.4 mg total) under the tongue every 5 (five) minutes as needed for chest pain. (Patient taking differently: Place 0.4 mg under the tongue every 5 (five) minutes as needed for chest pain. Patient has never had to use.)   pantoprazole (PROTONIX) 40 MG tablet TAKE 1 TABLET BY MOUTH EVERY DAY   tamsulosin (FLOMAX) 0.4 MG CAPS capsule TAKE 1 CAPSULE BY MOUTH DAILY  AFTER SUPPER. (Patient taking differently: Take 0.4 mg by mouth. Take 1 tablet twice a day by mouth.)     Allergies:   Penicillins, Doxycycline, and Levaquin [levofloxacin in d5w]   Social History   Socioeconomic History   Marital status: Married    Spouse name: [name]   Number of children: 6   Years of education: 9   Highest education level: 9th grade  Occupational History   Occupation: Archivist  Tobacco Use   Smoking status: Never   Smokeless tobacco: Never  Vaping Use   Vaping Use: Never used  Substance and Sexual Activity   Alcohol use: Yes    Comment: Rarely    Drug use: No   Sexual activity: Not Currently  Other Topics Concern   Not on file  Social History Narrative   Not on file   Social Determinants of Health   Financial Resource Strain: Not on file  Food Insecurity: Not on file  Transportation Needs: Not on file  Physical Activity: Not on file  Stress: Not on file  Social Connections: Not on file     Family History: The patient's family history includes CAD (age of onset: 23) in his sister; Cancer in his brother; Diabetes in his mother.  ROS:   Review of Systems  Constitutional: Negative.   HENT:  Negative.         See HPI.   Eyes: Negative.   Respiratory:  Positive for shortness of breath. Negative for cough, hemoptysis, sputum production and wheezing.        See HPI.   Cardiovascular:  Positive for chest pain. Negative for palpitations, orthopnea, claudication, leg swelling and PND.       See HPI.   Gastrointestinal: Negative.   Genitourinary:  Positive for frequency. Negative for dysuria, flank pain, hematuria and urgency.  Musculoskeletal:  Positive for back pain and joint pain. Negative for falls, myalgias and neck pain.  Skin: Negative.   Neurological:  Positive for tingling, sensory change and headaches. Negative for dizziness, tremors, speech change, focal weakness, seizures, loss of consciousness and weakness.       See HPI. Admits to hx of sinus headache, no red flag or atypical symptoms.   Endo/Heme/Allergies: Negative.   Psychiatric/Behavioral:  Negative for depression, hallucinations, memory loss, substance abuse and suicidal ideas. The patient is nervous/anxious and has insomnia.        See HPI. Says has been under significant stress.     Please see the history of present illness.     All other systems reviewed and are negative.  EKGs/Labs/Other Studies Reviewed:    The following studies were reviewed today:   EKG:  EKG is ordered today.  The ekg ordered today demonstrates NSR, 77 bpm, with nonspecific ST segment changes, otherwise nothing acute.   CT Angio Chest on March 15, 2020: IMPRESSION: 1. No evidence of pulmonary embolus. 2. Dependent ground-glass attenuation within the lungs felt to represent hypoventilatory change rather than airspace disease. 3. Stable 19 x 14 mm left lower lobe pulmonary nodule. Given long-term stability, this can be considered benign. 4.  Aortic Atherosclerosis (ICD10-I70.0).  Vascular ultrasound bilateral carotid duplex on October 12, 2016: - The vertebral arteries appear patent with antegrade    flow.Incidental multiple cysts  noted in right lobe of thyroid    gland  - Findings consistent with a 1- 39 percent stenosis involving the    right internal carotid artery and the left internal carotid    artery.  2D  echocardiogram on September 16, 2016: Left ventricle: The cavity size was normal. Wall thickness was    increased in a pattern of severe LVH. Systolic function was    normal. The estimated ejection fraction was in the range of 55%    to 60%. Wall motion was normal; there were no regional wall    motion abnormalities. Doppler parameters are consistent with    abnormal left ventricular relaxation (grade 1 diastolic    dysfunction).  - Aortic valve: Trileaflet; moderately calcified leaflets.    Sclerosis without stenosis.  - Aorta: Mildly dilated aortic root. Aortic root dimension: 38 mm    (ED).  - Mitral valve: Mildly calcified annulus. There was no significant    regurgitation.  - Right ventricle: The cavity size was normal. Systolic function    was normal.  - Tricuspid valve: Peak RV-RA gradient (S): 22 mm Hg.  - Pulmonary arteries: PA peak pressure: 25 mm Hg (S).  - Inferior vena cava: The vessel was normal in size. The    respirophasic diameter changes were in the normal range (= 50%),    consistent with normal central venous pressure.    Left heart cath and coronary angiography on September 09, 2016: Mid LAD to [facility] LAD lesion, 30 %stenosed. Prox Cx lesion, 25 %stenosed. Mid RCA lesion, 95 %stenosed. A STENT SIERRA 3.50 X 23 MM drug eluting stent was successfully placed. Post intervention, there is a 0% residual stenosis. Mid Cx lesion, 70 %stenosed. A STENT SIERRA 2.75 X 12 MM drug eluting stent was successfully placed. Post intervention, there is a 0% residual stenosis. Ost 1st Mrg lesion, 90 %stenosed. A STENT SIERRA 3.00 X 12 MM drug eluting stent was successfully placed. Post intervention, there is a 0% residual stenosis.   1. Severe 2 vessel obstructive CAD    - 90% ostial OM1    - 70%  distal LCx/OM2    - 95% mid RCA 2. Successful stenting of the mid RCA with DES 3. Successful stenting of the ostium of OM1 with DES 4. Successful stenting of the distal LCx/OM2   Plan: DAPT for at least one year. Anticipate DC in am.  Myoview on September 03, 2016: The left ventricular ejection fraction is moderately decreased (30-44%). Nuclear stress EF: 37%. There was no ST segment deviation noted during stress. This is an intermediate risk study.   1. EF 37%, diffuse hypokinesis.  2. Partially reversible medium-sized, moderate intensity basal inferior and inferoseptal and mid inferior perfusion defect. This suggests prior infarction with significant peri-infarct ischemia.    Intermediate risk study.  Recent Labs: 04/14/2021: ALT 17; BUN 24; Creatinine, Ser 1.26; Potassium 4.5; Sodium 139 11/28/2021: Hemoglobin 12.9; Platelets 142  Recent Lipid Panel    Component Value Date/Time   CHOL 152 04/14/2021 1626   TRIG 182 (H) 04/14/2021 1626   HDL 30 (L) 04/14/2021 1626   CHOLHDL 5.1 (H) 04/14/2021 1626   CHOLHDL 5 11/13/2016 0838   VLDL 39.0 11/13/2016 0838   LDLCALC 90 04/14/2021 1626   LDLDIRECT 74.0 12/17/2014 1505    Physical Exam:    VS:  BP 138/68 (BP Location: Left Arm, Patient Position: Sitting, Cuff Size: Large)   Pulse 77   Ht 6' (1.829 m)   Wt 209 lb 6.4 oz (95 kg)   SpO2 94%   BMI 28.40 kg/m     Wt Readings from Last 3 Encounters:  11/28/21 209 lb 6.4 oz (95 kg)  04/14/21 210 lb 9.6 oz (95.5 kg)  05/20/20 211 lb (95.7 kg)     GEN: Well nourished, well developed 81 y.o. male in no acute distress HEENT: EOMI, left lip/left side facial droop, no pain on palpation, EOMI, otherwise normal NECK: No JVD; No carotid bruits CARDIAC: S1/S2, RRR, no murmurs, rubs, gallops; 2+ peripheral pulses throughout, strong and equal bilaterally RESPIRATORY:  Clear and diminished to auscultation without rales, wheezing or rhonchi  MUSCULOSKELETAL:  No edema; No deformity  SKIN:  Thin skin, warm and dry, rash/small pinpoint red spots along lower right shin of right leg NEUROLOGIC:  Alert and oriented x 3, able to follow commands, equal and strong strength bilaterally, speech overall clear, appropriate judgment and short-term memory and long-term memory intact. PSYCHIATRIC:  Normal affect   ASSESSMENT:    1. Exertional chest pain   2. Coronary artery disease involving native heart with angina pectoris, unspecified vessel or lesion type ([place])   3. DOE (dyspnea on exertion)   4. Hyperlipidemia, unspecified hyperlipidemia type   5. Hypertriglyceridemia   6. Hypertension, unspecified type   7. History of CVA (cerebrovascular accident)   8. Facial numbness   9. Facial droop   10. Rash and nonspecific skin eruption    PLAN:    In order of problems listed above:  Exertional chest pain Last Myoview in 2018 was intermediate risk as revealed nuclear stress EF at 37% with moderately decreased LVEF at 30 to 44%, there was a partially reversible medium size, moderate intensity basal inferior and anteroseptal and mid inferior perfusion defect, that suggested a prior infarct with significant peri-infarct ischemia.  This prompted a left heart cath to be performed in 2018 that revealed severe two-vessel obstructive CAD, therefore he received successful stenting to OM1, mid RCA, and distal left circumflex.  EKG today revealed normal sinus rhythm with nonspecific ST segment changes.  Because of his symptoms, I suggested repeating ischemic evaluation for further evaluation and discussed several options and he is agreeable to proceed with Lexiscan stress test.  Risk and benefits discussed, and he is agreeable to proceed.  Continue aspirin, Lipitor, Plavix, Zetia, fenofibrate, nebivolol, and nitroglycerin as needed.  ED precautions discussed.     Shared Decision Making/Informed Consent The risks [chest pain, shortness of breath, cardiac arrhythmias, dizziness, blood pressure fluctuations,  myocardial infarction, stroke/transient ischemic attack, nausea, vomiting, allergic reaction, radiation exposure, metallic taste sensation and life-threatening complications (estimated to be 1 in 10,000)], benefits (risk stratification, diagnosing coronary artery disease, treatment guidance) and alternatives of a nuclear stress test were discussed in detail with Mr. Gulbranson and he agrees to proceed.  2.  Coronary artery disease, status post past hx of stenting Left heart cath in 2018 revealed severe two-vessel obstructive CAD, therefore he received successful stenting to OM1, mid RCA, and distal left circumflex.  History of NSTEMI in 2009 with past history of stent.  Has a total of 4 drug-eluting stents.  Recent exertional chest pain as mentioned above.  We will arrange Lexiscan stress test as mentioned above.  Continue aspirin, Lipitor, Plavix, Zetia, fenofibrate, nebivolol, and nitroglycerin as needed.  ED precautions discussed.  Heart healthy diet and regular physical activity as tolerated recommended.  3.  Dyspnea on exertion Chronic, stable according to patient's report.  Wife notices this when he goes up and down stairs.  Last echocardiogram in 2018 revealed EF 55 to 60%, no RWMA, grade 1 DD.  We will update TTE at this time.  ED precautions discussed.  4.  Hyperlipidemia, hypertriglyceridemia Labs from April 2023 revealed total  cholesterol 152, triglycerides 182, HDL 30, and LDL 90.  Continue Lipitor, Zetia, and fenofibrate. Heart healthy diet and regular cardiovascular exercise as tolerated encouraged.   5.  Hypertension Blood pressure today 138/68.  Denies any recent blood pressure issues at home.  Continue nebivolol. Discussed to monitor BP at home at least 2 hours after medications and sitting for 5-10 minutes. Heart healthy diet and regular cardiovascular as tolerated exercise encouraged.   6.  History of CVA Remote CVA in 2018.  Continue aspirin, Lipitor, Zetia, and Plavix.  Denies any  issues of bleeding. Heart healthy diet and regular cardiovascular exercise as tolerated encouraged. Continue to follow with PCP.  7.  Left-sided face numbness/tingling and left facial/lip droop, Bell's palsy? No alarming symptoms of stroke indicated on exam.  Denies any recent sick contacts or any recent viral infection.  Denies any flulike or sick symptoms, except for a sinus headache.  Has been under significant stress lately, politely declines any SW referral. Does not indicate sign of allergic reaction.  Concern for Bell's palsy presentation.  We will place referral to neurologist. Patient has had a stroke in 2018, he is on appropriate medical therapy for stroke and I highly doubt this is a stroke.  Continue aspirin, Lipitor, Plavix, and Zetia.  ED precautions discussed.  Discussed warning signs for stroke including acronym FAST, and if present, call 911 immediately.  8. Rash and nonspecific skin eruption Michela Pitcher he has had this before and this skin eruption started again recently. Located along right lower shin of right leg. Etiology unclear, does not appear to be shingles, wondering that this may be petechiae. Will obtain CBC today as pt has chronic history of low platelets. Continue to follow up with PCP.  9. Disposition: Follow-up with APP in 4 to 6 weeks or sooner if anything changes. He was given a list of PCPs to establish care.    Medication Adjustments/Labs and Tests Ordered: Current medicines are reviewed at length with the patient today.  Concerns regarding medicines are outlined above.  Orders Placed This Encounter  Procedures   CBC   Ambulatory referral to Neurology   MYOCARDIAL PERFUSION IMAGING   EKG 12-Lead   ECHOCARDIOGRAM COMPLETE   No orders of the defined types were placed in this encounter.   Patient Instructions  Medication Instructions:  The current medical regimen is effective;  continue present plan and medications as directed. Please refer to the Current  Medication list given to you today.  *If you need a refill on your cardiac medications before your next appointment, please call your pharmacy*   Lab Work: CBC TODAY If you have labs (blood work) drawn today and your tests are completely normal, you will receive your results only by:  [place] (if you have MyChart) OR  A paper copy in the mail  If you have any lab test that is abnormal or we need to change your treatment, we will call you to review the results.   Testing/Procedures: Finis Bud, NP has ordered a Myocardial Perfusion Imaging Study.   The test will take approximately 3 to 4 hours to complete; you may bring reading material.  If someone comes with you to your appointment, they will need to remain in the main lobby due to limited space in the testing area.   You will need to hold the following medications prior to your stress test: beta-blockers (24 hours prior to test)   How to prepare for your Myocardial Perfusion Test: Do not eat  or drink 3 hours prior to your test, except you may have water. Do not consume products containing caffeine (regular or decaffeinated) 12 hours prior to your test. (ex: coffee, chocolate, sodas, tea). Do wear comfortable clothes (no dresses or overalls) and walking shoes, tennis shoes preferred (No heels or open toe shoes are allowed). Do NOT wear cologne, perfume, aftershave, or lotions (deodorant is allowed). If these instructions are not followed, your test will have to be rescheduled.  Echocardiogram - Your physician has requested that you have an echocardiogram. Echocardiography is a painless test that uses sound waves to create images of your heart. It provides your doctor with information about the size and shape of your heart and how well your heart's chambers and valves are working. This procedure takes approximately one hour. There are no restrictions for this procedure.   Follow-Up: At [facility], you and your  health needs are our priority.  As part of our continuing mission to provide you with exceptional heart care, we have created designated Provider Care Teams.  These Care Teams include your primary Cardiologist (physician) and Advanced Practice Providers (APPs -  Physician Assistants and Nurse Practitioners) who all work together to provide you with the care you need, when you need it.  We recommend signing up for the patient portal called "MyChart".  Sign up information is provided on this After Visit Summary.  MyChart is used to connect with patients for Virtual Visits (Telemedicine).  Patients are able to view lab/test results, encounter notes, upcoming appointments, etc.  Non-urgent messages can be sent to your provider as well.   To learn more about what you can do with MyChart, go to [email or web address].    Your next appointment:   4-6 week(s)  The format for your next appointment:   In Person  Provider:   Coletta Memos, [place], Fabian Sharp, PA-C, Sande Rives, PA-C, Vikki Ports, PA-C, Caron Presume, PA-C, Jory Sims, DNP, ANP, Almyra Deforest, PA-C, or Diona Browner, NP        Other Instructions   Important Information About Sugar           Signed, Finis Bud, NP  11/29/2021 2:24 PM    [place]

## 2021-11-27 NOTE — Telephone Encounter (Signed)
Called patient left message on personal voice mail to call back. 

## 2021-11-27 NOTE — Telephone Encounter (Signed)
Received call from patient he stated he has been having chest pain,pain across shoulders,numbness in left side of face off and on for the past 8 to 9 weeks.No pain at present.Appointment scheduled with Coletta Memos NP tomorrow 11/17 at 9:15 am.Advised to go to ED if he has any more chest pain.I will make Dr.Jordan aware.

## 2021-11-28 ENCOUNTER — Encounter: Payer: Self-pay | Admitting: General Practice

## 2021-11-28 ENCOUNTER — Ambulatory Visit: Payer: Medicare Other | Attending: General Practice | Admitting: Nurse Practitioner

## 2021-11-28 VITALS — BP 138/68 | HR 77 | Ht 72.0 in | Wt 209.4 lb

## 2021-11-28 DIAGNOSIS — R0609 Other forms of dyspnea: Secondary | ICD-10-CM

## 2021-11-28 DIAGNOSIS — R079 Chest pain, unspecified: Secondary | ICD-10-CM | POA: Diagnosis not present

## 2021-11-28 DIAGNOSIS — R200 Anesthesia of skin: Secondary | ICD-10-CM | POA: Diagnosis not present

## 2021-11-28 DIAGNOSIS — E785 Hyperlipidemia, unspecified: Secondary | ICD-10-CM | POA: Diagnosis not present

## 2021-11-28 DIAGNOSIS — E781 Pure hyperglyceridemia: Secondary | ICD-10-CM | POA: Diagnosis not present

## 2021-11-28 DIAGNOSIS — R29810 Facial weakness: Secondary | ICD-10-CM

## 2021-11-28 DIAGNOSIS — I10 Essential (primary) hypertension: Secondary | ICD-10-CM | POA: Diagnosis not present

## 2021-11-28 DIAGNOSIS — I25119 Atherosclerotic heart disease of native coronary artery with unspecified angina pectoris: Secondary | ICD-10-CM

## 2021-11-28 DIAGNOSIS — R21 Rash and other nonspecific skin eruption: Secondary | ICD-10-CM

## 2021-11-28 DIAGNOSIS — Z8673 Personal history of transient ischemic attack (TIA), and cerebral infarction without residual deficits: Secondary | ICD-10-CM

## 2021-11-28 NOTE — Patient Instructions (Addendum)
Medication Instructions:  The current medical regimen is effective;  continue present plan and medications as directed. Please refer to the Current Medication list given to you today.  *If you need a refill on your cardiac medications before your next appointment, please call your pharmacy*   Lab Work: CBC TODAY If you have labs (blood work) drawn today and your tests are completely normal, you will receive your results only by:  [place] (if you have MyChart) OR  A paper copy in the mail  If you have any lab test that is abnormal or we need to change your treatment, we will call you to review the results.   Testing/Procedures: Coletta Memos, NP has ordered a Myocardial Perfusion Imaging Study.   The test will take approximately 3 to 4 hours to complete; you may bring reading material.  If someone comes with you to your appointment, they will need to remain in the main lobby due to limited space in the testing area.   You will need to hold the following medications prior to your stress test: beta-blockers (24 hours prior to test)   How to prepare for your Myocardial Perfusion Test: Do not eat or drink 3 hours prior to your test, except you may have water. Do not consume products containing caffeine (regular or decaffeinated) 12 hours prior to your test. (ex: coffee, chocolate, sodas, tea). Do wear comfortable clothes (no dresses or overalls) and walking shoes, tennis shoes preferred (No heels or open toe shoes are allowed). Do NOT wear cologne, perfume, aftershave, or lotions (deodorant is allowed). If these instructions are not followed, your test will have to be rescheduled.  Echocardiogram - Your physician has requested that you have an echocardiogram. Echocardiography is a painless test that uses sound waves to create images of your heart. It provides your doctor with information about the size and shape of your heart and how well your heart's chambers and valves are working.  This procedure takes approximately one hour. There are no restrictions for this procedure.   Follow-Up: At [facility], you and your health needs are our priority.  As part of our continuing mission to provide you with exceptional heart care, we have created designated Provider Care Teams.  These Care Teams include your primary Cardiologist (physician) and Advanced Practice Providers (APPs -  Physician Assistants and Nurse Practitioners) who all work together to provide you with the care you need, when you need it.  We recommend signing up for the patient portal called "MyChart".  Sign up information is provided on this After Visit Summary.  MyChart is used to connect with patients for Virtual Visits (Telemedicine).  Patients are able to view lab/test results, encounter notes, upcoming appointments, etc.  Non-urgent messages can be sent to your provider as well.   To learn more about what you can do with MyChart, go to [email or web address].    Your next appointment:   4-6 week(s)  The format for your next appointment:   In Person  Provider:   Coletta Memos, [place], Fabian Sharp, PA-C, Sande Rives, PA-C, Vikki Ports, PA-C, Caron Presume, PA-C, Jory Sims, DNP, ANP, Almyra Deforest, PA-C, or Diona Browner, NP        Other Instructions   Important Information About Sugar

## 2021-11-29 ENCOUNTER — Encounter: Payer: Self-pay | Admitting: Nurse Practitioner

## 2021-11-29 LAB — CBC
Hematocrit: 39.9 % (ref 37.5–51.0)
Hemoglobin: 12.9 g/dL — ABNORMAL LOW (ref 13.0–17.7)
MCH: 29.9 pg (ref 26.6–33.0)
MCHC: 32.3 g/dL (ref 31.5–35.7)
MCV: 92 fL (ref 79–97)
Platelets: 142 10*3/uL — ABNORMAL LOW (ref 150–450)
RBC: 4.32 x10E6/uL (ref 4.14–5.80)
RDW: 13.1 % (ref 11.6–15.4)
WBC: 5.2 10*3/uL (ref 3.4–10.8)

## 2021-12-02 ENCOUNTER — Ambulatory Visit: Payer: Medicare Other | Admitting: Nurse Practitioner

## 2021-12-18 ENCOUNTER — Telehealth: Payer: Self-pay | Admitting: *Deleted

## 2021-12-18 NOTE — Telephone Encounter (Signed)
Per DPR spoke to pt's wife [name] and gave instructions for MPI study scheduled on 12/25/21.

## 2021-12-25 ENCOUNTER — Ambulatory Visit (HOSPITAL_COMMUNITY): Payer: Medicare Other | Attending: Internal Medicine

## 2021-12-25 ENCOUNTER — Ambulatory Visit (HOSPITAL_BASED_OUTPATIENT_CLINIC_OR_DEPARTMENT_OTHER): Payer: Medicare Other

## 2021-12-25 DIAGNOSIS — R079 Chest pain, unspecified: Secondary | ICD-10-CM

## 2021-12-25 DIAGNOSIS — E785 Hyperlipidemia, unspecified: Secondary | ICD-10-CM | POA: Diagnosis not present

## 2021-12-25 DIAGNOSIS — R0609 Other forms of dyspnea: Secondary | ICD-10-CM | POA: Diagnosis not present

## 2021-12-25 DIAGNOSIS — I2510 Atherosclerotic heart disease of native coronary artery without angina pectoris: Secondary | ICD-10-CM | POA: Insufficient documentation

## 2021-12-25 DIAGNOSIS — I3481 Nonrheumatic mitral (valve) annulus calcification: Secondary | ICD-10-CM | POA: Diagnosis not present

## 2021-12-25 DIAGNOSIS — I10 Essential (primary) hypertension: Secondary | ICD-10-CM | POA: Diagnosis not present

## 2021-12-25 DIAGNOSIS — I252 Old myocardial infarction: Secondary | ICD-10-CM | POA: Insufficient documentation

## 2021-12-25 LAB — ECHOCARDIOGRAM COMPLETE
Area-P 1/2: 2.66 cm2
Height: 72 in
S' Lateral: 3.5 cm
Weight: 3344 oz

## 2021-12-25 LAB — MYOCARDIAL PERFUSION IMAGING
LV dias vol: 101 mL (ref 62–150)
LV sys vol: 46 mL
Nuc Stress EF: 54 %
Peak HR: 78 {beats}/min
Rest HR: 52 {beats}/min
Rest Nuclear Isotope Dose: 10.1 mCi
SDS: 3
SRS: 0
SSS: 3
ST Depression (mm): 0 mm
Stress Nuclear Isotope Dose: 31.8 mCi
TID: 1.01

## 2021-12-25 MED ORDER — TECHNETIUM TC 99M TETROFOSMIN IV KIT
31.8000 | PACK | Freq: Once | INTRAVENOUS | Status: AC | PRN
  Administered 2021-12-25: 31.8 via INTRAVENOUS

## 2021-12-25 MED ORDER — TECHNETIUM TC 99M TETROFOSMIN IV KIT
10.1000 | PACK | Freq: Once | INTRAVENOUS | Status: AC | PRN
  Administered 2021-12-25: 10.1 via INTRAVENOUS

## 2021-12-25 MED ORDER — REGADENOSON 0.4 MG/5ML IV SOLN
0.4000 mg | Freq: Once | INTRAVENOUS | Status: AC
  Administered 2021-12-25: 0.4 mg via INTRAVENOUS

## 2021-12-28 NOTE — Progress Notes (Unsigned)
Cardiology Clinic Note   Patient Name: Colin Lopez Date of Encounter: 12/30/2021  Primary Care Provider:  Jearld Fenton, NP Primary Cardiologist:  [name] [name], MD  Patient Profile    Colin Lopez 81 year old male presents to the clinic today for follow-up evaluation of his exertional chest pain and DOE.  Past Medical History    Past Medical History:  Diagnosis Date   CAD (coronary artery disease), native coronary artery    09/09/16 PCI/DES x3 to mRCA, OM1 and dLcx/OM2.    Coronary disease    Status post stenting of the left circumflex coronary in 2009 with a bare-metal stent (with a 3.5x18m Liberte stent)   Dyslipidemia    Dyspnea on exertion    Exertional chest pain    Exposure to TB    Hyperlipidemia    Hypertension    Hypertriglyceridemia    [place]   BMS CFX   Numbness and tingling of left side of face    with left lip/face droop (Concern for Bell's Palsy)   Rash and nonspecific skin eruption    Lower right anterior leg   Stroke ([place]    TIA (transient ischemic attack)    history of tia   Urinary tract infection    Past Surgical History:  Procedure Laterality Date   CARDIOVASCULAR STRESS TEST  10-03-08   EF 59%   CORONARY ANGIOPLASTY WITH STENT PLACEMENT  09/09/2016   CORONARY STENT INTERVENTION N/A 09/09/2016   Procedure: CORONARY STENT INTERVENTION;  Surgeon: JMartinique [name] M, MD;  [facility];  Service: Cardiovascular;  Laterality: N/A;   LEFT HEART CATH AND CORONARY ANGIOGRAPHY N/A 09/09/2016   Procedure: LEFT HEART CATH AND CORONARY ANGIOGRAPHY;  Surgeon: JMartinique [name] M, MD;  [facility];  Service: Cardiovascular;  Laterality: N/A;   [place]  09-21[place]08   EF 55-60%    Allergies  Allergies  Allergen Reactions   Penicillins Hives    Has patient had a PCN reaction causing immediate rash, facial/tongue/throat swelling, SOB or lightheadedness with  hypotension: No Has patient had a PCN reaction causing severe rash involving mucus membranes or skin necrosis: Yes Has patient had a PCN reaction that required hospitalization: No Has patient had a PCN reaction occurring within the last 10 years: No If all of the above answers are "NO", then may proceed with Cephalosporin use. Ceftriaxone Ok   Doxycycline Rash    Water blisters   Levaquin [Levofloxacin In D5w] Rash    History of Present Illness    Colin BUENGERhas a PMH of NSTEMI 2009 with stenting of his DES-OM and distal circumflex, mid RCA in 2018, HTN, HLD, CVA, pulmonary nodule seen on CT 2022, and DOE.  He was seen and evaluated in 2019 for atypical left shoulder and neck pain.  Nuclear stress test was ordered at that time but never done.  CT of his chest was negative for PE.  He was found to have a rounded density in his left lower lobe of uncertain etiology.  PET scan in June showed low risk and low metabolic activity.  He underwent nuclear stress test which showed an EF of 37% and he underwent cardiac catheterization 8/18 which showed three-vessel CAD.  He was admitted 9/18 with blurred vision, headache, dizziness, and generalized weakness.  MRI brain showed acute/subacute infarct.  CT angio head and neck showed widely patent bilateral ICA, atherosclerotic disease of his internal carotid arteries.  Echocardiogram showed an EF of 55 to 60%, G1 DD and no regional wall motion abnormalities.  Neurology was consulted and he was continued on aspirin and Plavix.  He was admitted 5/21 with urinary infection and AKI.  He followed up with Dr. [name] 4/23 and was doing well.  He did note some shortness of breath which was attributed to his asthma.  He denied chest pain palpitations and dizziness.  He continued to be fairly active.  Follow-up was planned for 1 year.  He contacted the nurse triage line 11/23 and reported shortness of breath with exertion.  He was seen by Finis Bud, NP-C on  11/28/2021.  Echocardiogram was ordered 12/25/2021 which showed normal EF and G1 DD.  Stress testing 12/25/2021 showed low risk and no evidence of ischemia.  He presents to the clinic today for follow-up evaluation states he continues to have some shortness of breath.  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  He does not do any formal exercise.  His blood pressure today initially is 178/82 and on recheck is 136/78.  He reports that he is under increased stress related to his rental properties time of year.  We reviewed his echocardiogram and his stress testing as well as his lab work.  He and his wife expressed understanding.  It appears that his shortness of breath is related to his pulmonary issues.  I recommended that he follow-up with his pulmonologist.  I also recommended that he increase his physical activity as tolerated sheet for goal of 150 minutes of moderate physical activity per week.  We will plan follow-up in 9 to 12 months.  Today denies chest pain, shortness of breath, lower extremity edema, fatigue, palpitations, melena, hematuria, hemoptysis, diaphoresis, weakness, presyncope, syncope, orthopnea, and PND.    Home Medications    Prior to Admission medications   Medication Sig Start Date End Date Taking? Authorizing Provider  acetaminophen (TYLENOL) 500 MG tablet Take 1,000 mg by mouth every 6 (six) hours as needed for headache (pain).    [provider]  aspirin EC 81 MG tablet Take 1 tablet (81 mg total) by mouth daily. 11/06/13   [name], [name] M, MD  atorvastatin (LIPITOR) 40 MG tablet TAKE 1 TABLET BY MOUTH EVERY DAY 08/13/21   [name], [name] M, MD  clopidogrel (PLAVIX) 75 MG tablet TAKE 1 TABLET BY MOUTH EVERY DAY WITH BREAKFAST MUST KEEP APPT 02/17/21   [name], [name] M, MD  ezetimibe (ZETIA) 10 MG tablet TAKE 1 TABLET BY MOUTH EVERY DAY 05/14/21   [name], [name] M, MD  fenofibrate 160 MG tablet TAKE 1 TABLET BY MOUTH EVERY DAY 05/19/21   [name], [name] M,  MD  KRILL OIL PO Take by mouth 2 (two) times daily.    [provider]  nebivolol (BYSTOLIC) 10 MG tablet TAKE 1 TABLET BY MOUTH EVERY DAY 02/17/21   [name], [name] M, MD  nitroGLYCERIN (NITROSTAT) 0.4 MG SL tablet Place 1 tablet (0.4 mg total) under the tongue every 5 (five) minutes as needed for chest pain. Patient taking differently: Place 0.4 mg under the tongue every 5 (five) minutes as needed for chest pain. Patient has never had to use. 12/29/18   [name], [name] M, MD  pantoprazole (PROTONIX) 40 MG tablet TAKE 1 TABLET BY MOUTH EVERY DAY 05/19/21   [name], [name] M, MD  tamsulosin (FLOMAX) 0.4 MG CAPS capsule TAKE 1 CAPSULE BY MOUTH DAILY AFTER SUPPER. Patient taking differently: Take 0.4 mg by mouth. Take 1 tablet  twice a day by mouth. 06/28/19   Jearld Fenton, NP  traMADol (ULTRAM) 50 MG tablet Take 50 mg by mouth every 6 (six) hours as needed. Patient not taking: Reported on 11/28/2021    [provider]    Family History    Family History  Problem Relation Age of Onset   Diabetes Mother    CAD Sister 21       MI, obese   Cancer Brother        stomach   He indicated that his mother is deceased. He indicated that his father is deceased. He indicated that both of his sisters are deceased. He indicated that his brother is deceased. He indicated that his maternal grandmother is deceased. He indicated that his maternal grandfather is deceased. He indicated that his paternal grandmother is deceased. He indicated that his paternal grandfather is deceased. He indicated that his son is deceased. He indicated that the status of his other is unknown and reported the following: Pt is an orphan and does not know his parents hx.  Social History    Social History   Socioeconomic History   Marital status: Married    Spouse name: [name]   Number of children: 6   Years of education: 9   Highest education level: 9th grade  Occupational History   Occupation: Archivist  Tobacco  Use   Smoking status: Never   Smokeless tobacco: Never  Vaping Use   Vaping Use: Never used  Substance and Sexual Activity   Alcohol use: Yes    Comment: Rarely    Drug use: No   Sexual activity: Not Currently  Other Topics Concern   Not on file  Social History Narrative   Not on file   Social Determinants of Health   Financial Resource Strain: Not on file  Food Insecurity: Not on file  Transportation Needs: Not on file  Physical Activity: Not on file  Stress: Not on file  Social Connections: Not on file  Intimate Partner Violence: Not on file     Review of Systems    General:  No chills, fever, night sweats or weight changes.  Cardiovascular:  No chest pain, dyspnea on exertion, edema, orthopnea, palpitations, paroxysmal nocturnal dyspnea. Dermatological: No rash, lesions/masses Respiratory: No cough, dyspnea Urologic: No hematuria, dysuria Abdominal:   No nausea, vomiting, diarrhea, bright red blood per rectum, melena, or hematemesis Neurologic:  No visual changes, wkns, changes in mental status. All other systems reviewed and are otherwise negative except as noted above.  Physical Exam    VS:  BP 136/78   Pulse 63   Ht 6' (1.829 m)   Wt 210 lb (95.3 kg)   SpO2 95%   BMI 28.48 kg/m  , BMI Body mass index is 28.48 kg/m. GEN: Well nourished, well developed, in no acute distress. HEENT: normal. Neck: Supple, no JVD, carotid bruits, or masses. Cardiac: RRR, no murmurs, rubs, or gallops. No clubbing, cyanosis, edema.  Radials/DP/PT 2+ and equal bilaterally.  Respiratory:  Respirations regular and unlabored, clear to auscultation bilaterally. GI: Soft, nontender, nondistended, BS + x 4. MS: no deformity or atrophy. Skin: warm and dry, no rash. Neuro:  Strength and sensation are intact. Psych: Normal affect.  Accessory Clinical Findings    Recent Labs: 04/14/2021: ALT 17; BUN 24; Creatinine, Ser 1.26; Potassium 4.5; Sodium 139 11/28/2021: Hemoglobin 12.9;  Platelets 142   Recent Lipid Panel    Component Value Date/Time   CHOL 152 04/14/2021 1626  TRIG 182 (H) 04/14/2021 1626   HDL 30 (L) 04/14/2021 1626   CHOLHDL 5.1 (H) 04/14/2021 1626   CHOLHDL 5 11/13/2016 0838   VLDL 39.0 11/13/2016 0838   LDLCALC 90 04/14/2021 1626   LDLDIRECT 74.0 12/17/2014 1505         ECG personally reviewed by me today-none today.  Echocardiogram 12/25/2021 IMPRESSIONS     1. Left ventricular ejection fraction, by estimation, is 60 to 65%. Left  ventricular ejection fraction by 3D volume is 59 %. The left ventricle has  normal function. The left ventricle has no regional wall motion  abnormalities. Left ventricular diastolic   parameters are consistent with Grade I diastolic dysfunction (impaired  relaxation).   2. Right ventricular systolic function is normal. The right ventricular  size is normal. There is normal pulmonary artery systolic pressure. The  estimated right ventricular systolic pressure is 01.7 mmHg.   3. The mitral valve is degenerative. Trivial mitral valve regurgitation.  No evidence of mitral stenosis. Moderate mitral annular calcification.   4. The aortic valve is grossly normal. There is mild calcification of the  aortic valve. Aortic valve regurgitation is not visualized. No aortic  stenosis is present.   5. There is borderline dilatation of the ascending aorta, measuring 42  mm.   6. The inferior vena cava is normal in size with greater than 50%  respiratory variability, suggesting right atrial pressure of 3 mmHg.   FINDINGS   Left Ventricle: Left ventricular ejection fraction, by estimation, is 60  to 65%. Left ventricular ejection fraction by 3D volume is 59 %. The left  ventricle has normal function. The left ventricle has no regional wall  motion abnormalities. The left  ventricular internal cavity size was normal in size. There is no left  ventricular hypertrophy. Left ventricular diastolic parameters are   consistent with Grade I diastolic dysfunction (impaired relaxation).   Right Ventricle: The right ventricular size is normal. No increase in  right ventricular wall thickness. Right ventricular systolic function is  normal. There is normal pulmonary artery systolic pressure. The tricuspid  regurgitant velocity is 2.08 m/s, and   with an assumed right atrial pressure of 3 mmHg, the estimated right  ventricular systolic pressure is 49.4 mmHg.   Left Atrium: Left atrial size was normal in size.   Right Atrium: Right atrial size was normal in size.   Pericardium: There is no evidence of pericardial effusion.   Mitral Valve: The mitral valve is degenerative in appearance. Moderate  mitral annular calcification. Trivial mitral valve regurgitation. No  evidence of mitral valve stenosis.   Tricuspid Valve: The tricuspid valve is normal in structure. Tricuspid  valve regurgitation is trivial. No evidence of tricuspid stenosis.   Aortic Valve: The aortic valve is grossly normal. There is mild  calcification of the aortic valve. Aortic valve regurgitation is not  visualized. No aortic stenosis is present.   Pulmonic Valve: The pulmonic valve was normal in structure. Pulmonic valve  regurgitation is trivial. No evidence of pulmonic stenosis.   Aorta: The aortic root is normal in size and structure. There is  borderline dilatation of the ascending aorta, measuring 42 mm.   Venous: The inferior vena cava is normal in size with greater than 50%  respiratory variability, suggesting right atrial pressure of 3 mmHg.   IAS/Shunts: No atrial level shunt detected by color flow Doppler.   Nuclear stress test 12/25/2021   The study is normal. The study is low risk.  No ST deviation was noted.   LV perfusion is normal. There is no evidence of ischemia. There is no evidence of infarction.   Left ventricular function is normal. Nuclear stress EF: 54 %. The left ventricular ejection fraction is  mildly decreased (45-54%). End diastolic cavity size is normal. End systolic cavity size is normal.   Prior study available for comparison from 09/03/2016.  Assessment & Plan   1.  Coronary artery disease-no chest pain today.  Recent stress test reassuring.  Stress testing showed low risk and no ischemia Continue atorvastatin, aspirin, ezetimibe, fenofibrate, nebivolol Heart healthy low-sodium diet-salty 6 given Increase physical activity as tolerated  Dyspnea on exertion-breathing stable.  Echocardiogram showed normal LVEF G1 DD and no wall motion abnormalities.  DOE appears to be multifactorial in the setting of deconditioning, pulmonary, and CVA. Increase physical activity as tolerated Follow-up with pulmonology  Hyperlipidemia-LDL 90 on 04/14/21 Continue atorvastatin, ezetimibe Heart healthy low-sodium high-fiber diet Increase physical activity as tolerated   History of CVA-reports compliance with Plavix and aspirin Increase physical activity as tolerated Follows with neurology  Disposition: Follow-up with Dr. [name] in 9-12 months.   Jossie Ng. Caylon Saine NP-C     12/30/2021, 3:28 PM [facility] [address] Office [phone] Fax [phone]    I spent 14 minutes examining this patient, reviewing medications, and using patient centered shared decision making involving her cardiac care.  Prior to her visit I spent greater than 20 minutes reviewing her past medical history,  medications, and prior cardiac tests.

## 2021-12-30 ENCOUNTER — Ambulatory Visit: Payer: Medicare Other | Attending: General Practice | Admitting: General Practice

## 2021-12-30 ENCOUNTER — Encounter: Payer: Self-pay | Admitting: General Practice

## 2021-12-30 VITALS — BP 136/78 | HR 63 | Ht 72.0 in | Wt 210.0 lb

## 2021-12-30 DIAGNOSIS — E785 Hyperlipidemia, unspecified: Secondary | ICD-10-CM | POA: Diagnosis not present

## 2021-12-30 DIAGNOSIS — Z8673 Personal history of transient ischemic attack (TIA), and cerebral infarction without residual deficits: Secondary | ICD-10-CM

## 2021-12-30 DIAGNOSIS — I25119 Atherosclerotic heart disease of native coronary artery with unspecified angina pectoris: Secondary | ICD-10-CM | POA: Diagnosis not present

## 2021-12-30 DIAGNOSIS — R0609 Other forms of dyspnea: Secondary | ICD-10-CM | POA: Diagnosis not present

## 2021-12-30 NOTE — Patient Instructions (Signed)
Medication Instructions:  The current medical regimen is effective;  continue present plan and medications as directed. Please refer to the Current Medication list given to you today.  *If you need a refill on your cardiac medications before your next appointment, please call your pharmacy*   Lab Work: NONE If you have labs (blood work) drawn today and your tests are completely normal, you will receive your results only by: [place] (if you have MyChart) OR A paper copy in the mail If you have any lab test that is abnormal or we need to change your treatment, we will call you to review the results.   Testing/Procedures: NONE   Follow-Up: At [facility], you and your health needs are our priority.  As part of our continuing mission to provide you with exceptional heart care, we have created designated Provider Care Teams.  These Care Teams include your primary Cardiologist (physician) and Advanced Practice Providers (APPs -  Physician Assistants and Nurse Practitioners) who all work together to provide you with the care you need, when you need it.  We recommend signing up for the patient portal called "MyChart".  Sign up information is provided on this After Visit Summary.  MyChart is used to connect with patients for Virtual Visits (Telemedicine).  Patients are able to view lab/test results, encounter notes, upcoming appointments, etc.  Non-urgent messages can be sent to your provider as well.   To learn more about what you can do with MyChart, go to [email or web address].    Your next appointment:   9-12 month(s)  The format for your next appointment:   In Person  Provider:   [name] [name], MD     Other Instructions INCREASE PHYSICAL ACTIVITY AS TOLERATED GOAL IS 150 MINUTES OF MODERATED PHYSICAL ACTIVITY-WEEKLY  Important Information About Sugar

## 2022-02-21 ENCOUNTER — Other Ambulatory Visit: Payer: Self-pay | Admitting: Cardiology

## 2022-02-22 ENCOUNTER — Other Ambulatory Visit: Payer: Self-pay | Admitting: Cardiology

## 2022-03-21 ENCOUNTER — Other Ambulatory Visit: Payer: Self-pay | Admitting: Cardiology

## 2022-05-20 ENCOUNTER — Other Ambulatory Visit: Payer: Self-pay | Admitting: Cardiology

## 2022-06-18 ENCOUNTER — Other Ambulatory Visit: Payer: Self-pay | Admitting: Cardiology

## 2022-07-12 ENCOUNTER — Ambulatory Visit (INDEPENDENT_AMBULATORY_CARE_PROVIDER_SITE_OTHER): Payer: Medicare Other

## 2022-07-12 ENCOUNTER — Ambulatory Visit
Admission: EM | Admit: 2022-07-12 | Discharge: 2022-07-12 | Disposition: A | Discharge status: Home / Self Care | Payer: Medicare Other | Attending: Emergency Medicine | Admitting: Emergency Medicine

## 2022-07-12 DIAGNOSIS — J189 Pneumonia, unspecified organism: Secondary | ICD-10-CM

## 2022-07-12 DIAGNOSIS — R0602 Shortness of breath: Secondary | ICD-10-CM

## 2022-07-12 DIAGNOSIS — R059 Cough, unspecified: Secondary | ICD-10-CM | POA: Diagnosis not present

## 2022-07-12 DIAGNOSIS — R918 Other nonspecific abnormal finding of lung field: Secondary | ICD-10-CM | POA: Diagnosis not present

## 2022-07-12 DIAGNOSIS — R058 Other specified cough: Secondary | ICD-10-CM | POA: Diagnosis not present

## 2022-07-12 MED ORDER — AZITHROMYCIN 250 MG PO TABS: 250.0000 mg | ORAL_TABLET | Freq: Every day | ORAL | 0 refills | Status: DC

## 2022-07-12 MED ORDER — CEFDINIR 300 MG PO CAPS: 300.0000 mg | ORAL_CAPSULE | Freq: Two times a day (BID) | ORAL | 0 refills | Status: AC

## 2022-07-12 NOTE — Discharge Instructions (Addendum)
Take the cefdinir and Zithromax as directed.  Follow up with your primary care provider tomorrow.  Go to the emergency department if your symptoms worsen.

## 2022-07-12 NOTE — ED Triage Notes (Signed)
Patient to Urgent Care with complaints of productive cough/ nasal congestion/ headaches. Denies any known fevers.  Reports symptoms started over one week ago. Poor sleep. Sore ribs d/t cough.

## 2022-07-12 NOTE — ED Provider Notes (Signed)
Renaldo Fiddler    CSN: [id number] Arrival date & time: 07/12/22  8657      History   Chief Complaint Chief Complaint  Patient presents with   Cough   Nasal Congestion   Headache    HPI Colin Lopez is a 82 y.o. male.  Patient presents with congestion, runny nose, productive cough, shortness of breath x 1 week.  Treating with OTC cough and cold medication.  No fever, chest pain, or other symptoms.   His medical history includes pulmonary nodule, hypertension, stroke, NSTEMI, dyspnea on exertion.   The history is provided by the patient and medical records.    Past Medical History:  Diagnosis Date   CAD (coronary artery disease), native coronary artery    09/09/16 PCI/DES x3 to mRCA, OM1 and dLcx/OM2.    Coronary disease    Status post stenting of the left circumflex coronary in 2009 with a bare-metal stent (with a 3.5x86mm Liberte stent)   Dyslipidemia    Dyspnea on exertion    Exertional chest pain    Exposure to TB    Hyperlipidemia    Hypertension    Hypertriglyceridemia    NSTEMI (non-ST elevated myocardial infarction) (HCC) 2009   BMS CFX   Numbness and tingling of left side of face    with left lip/face droop (Concern for Bell's Palsy)   Rash and nonspecific skin eruption    Lower right anterior leg   Stroke (HCC)    TIA (transient ischemic attack)    history of tia   Urinary tract infection     Patient Active Problem List   Diagnosis Date Noted   DOE (dyspnea on exertion) 03/13/2020   Upper airway cough syndrome 12/31/2016   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   Angina pectoris (HCC) 09/09/2016   Solitary pulmonary nodule s/p clinical CAP or asp sup segment L  04/20/2016   Insomnia 12/17/2014   Neuropathic pain of right foot 12/17/2014   Coronary disease    Dyslipidemia    Essential hypertension     Past Surgical History:  Procedure Laterality Date   CARDIOVASCULAR STRESS TEST  10-03-08   EF 59%   CORONARY ANGIOPLASTY WITH STENT PLACEMENT   09/09/2016   CORONARY STENT INTERVENTION N/A 09/09/2016   Procedure: CORONARY STENT INTERVENTION;  Surgeon: Swaziland, [name] M, MD;  Location: [facility];  Service: Cardiovascular;  Laterality: N/A;   LEFT HEART CATH AND CORONARY ANGIOGRAPHY N/A 09/09/2016   Procedure: LEFT HEART CATH AND CORONARY ANGIOGRAPHY;  Surgeon: [place]azil[place], [name] M, MD;  Location: [facility]VAS[facility] CV LAB;  Service: Cardiovascular;  Laterality: N/A;   US ECHOCARDIOGRAPHY  09-21-08   EF 55-60%       Home Medications    Prior to Admission medications   Medication Sig Start Date End Date Taking? Authorizing Provider  azithromycin (ZITHROMAX) 250 MG tablet Take 1 tablet (250 mg total) by mouth daily. Take first 2 tablets together, then 1 every day until finished. 07/12/22  Yes Mickie Bail, NP  cefdinir (OMNICEF) 300 MG capsule Take 1 capsule (300 mg total) by mouth 2 (two) times daily for 7 days. 07/12/22 07/19/22 Yes Mickie Bail, NP  acetaminophen (TYLENOL) 500 MG tablet Take 1,000 mg by mouth every 6 (six) hours as needed for headache (pain).    [provider]  aspirin EC 81 MG tablet Take 1 tablet (81 mg total) by mouth daily. 11/06/13   [place], [name] M, MD  atorvastatin (LIPITOR) 40 MG tablet  TAKE 1 TABLET BY MOUTH EVERY DAY 05/20/22   [place], [name] M, MD  clopidogrel (PLAVIX) 75 MG tablet TAKE 1 TABLET BY MOUTH EVERY DAY WITH BREAKFAST. *MUST KEEP UPCOMING APPT* 03/23/22   [place], [name] M, MD  ezetimibe (ZETIA) 10 MG tablet TAKE 1 TABLET BY MOUTH EVERY DAY 05/20/22   [place], [name] M, MD  fenofibrate 160 MG tablet TAKE 1 TABLET BY MOUTH EVERY DAY 06/18/22   [place], [name] M, MD  KRILL OIL PO Take by mouth 2 (two) times daily.    [provider]  nebivolol (BYSTOLIC) 10 MG tablet TAKE 1 TABLET BY MOUTH EVERY DAY 02/23/22   [place], [name] M, MD  nitroGLYCERIN (NITROSTAT) 0.4 MG SL tablet Place 1 tablet (0.4 mg total) under the tongue every 5 (five) minutes as needed for chest pain. Patient taking differently: Place  0.4 mg under the tongue every 5 (five) minutes as needed for chest pain. Patient has never had to use. 12/29/18   [place], [name] M, MD  pantoprazole (PROTONIX) 40 MG tablet TAKE 1 TABLET BY MOUTH EVERY DAY 02/23/22   [place], [name] M, MD  tamsulosin (FLOMAX) 0.4 MG CAPS capsule TAKE 1 CAPSULE BY MOUTH DAILY AFTER SUPPER. Patient taking differently: Take 0.4 mg by mouth. Take 1 tablet twice a day by mouth. 06/28/19   Lorre Munroe, NP  traMADol (ULTRAM) 50 MG tablet Take 50 mg by mouth every 6 (six) hours as needed. Patient not taking: Reported on 11/28/2021    [provider]    Family History Family History  Problem Relation Age of Onset   Diabetes Mother    CAD Sister 58       MI, obese   Cancer Brother        stomach    Social History Social History   Tobacco Use   Smoking status: Never   Smokeless tobacco: Never  Vaping Use   Vaping Use: Never used  Substance Use Topics   Alcohol use: Yes    Comment: Rarely    Drug use: No     Allergies   Penicillins, Doxycycline, and Levaquin [levofloxacin in d5w]   Review of Systems Review of Systems  Constitutional:  Negative for chills and fever.  HENT:  Negative for ear pain and sore throat.   Respiratory:  Positive for cough and shortness of breath.   Cardiovascular:  Negative for chest pain and palpitations.     Physical Exam Triage Vital Signs ED Triage Vitals  Enc Vitals Group     BP      Pulse      Resp      Temp      Temp src      SpO2      Weight      Height      Head Circumference      Peak Flow      Pain Score      Pain Loc      Pain Edu?      Excl. in GC?    No data found.  Updated Vital Signs BP 121/73   Pulse 66   Temp 99.3 F (37.4 C)   Resp 18   SpO2 93%   Visual Acuity Right Eye Distance:   Left Eye Distance:   Bilateral Distance:    Right Eye Near:   Left Eye Near:    Bilateral Near:     Physical Exam Vitals and nursing note reviewed.  Constitutional:  General:  He is not in acute distress.    Appearance: He is well-developed. He is ill-appearing.  HENT:     Right Ear: Tympanic membrane normal.     Left Ear: Tympanic membrane normal.     Nose: Congestion and rhinorrhea present.     Mouth/Throat:     Mouth: Mucous membranes are moist.     Pharynx: Oropharynx is clear.  Cardiovascular:     Rate and Rhythm: Normal rate and regular rhythm.     Heart sounds: Normal heart sounds.  Pulmonary:     Effort: Pulmonary effort is normal. No respiratory distress.     Breath sounds: Normal breath sounds.  Musculoskeletal:     Cervical back: Neck supple.  Skin:    General: Skin is warm and dry.  Neurological:     Mental Status: He is alert.  Psychiatric:        Mood and Affect: Mood normal.        Behavior: Behavior normal.      UC Treatments / Results  Labs (all labs ordered are listed, but only abnormal results are displayed) Labs Reviewed - No data to display  EKG   Radiology DG Chest 2 View  Result Date: 07/12/2022 CLINICAL DATA:  cough, SOB EXAM: CHEST - 2 VIEW COMPARISON:  03/13/2020 FINDINGS: Persistent 16 mm nodule left lower lung present on studies back to 05/29/2016 suggesting benign process. Mild right perihilar interstitial opacities. Heart size and mediastinal contours are within normal limits. Aortic Atherosclerosis (ICD10-170.0). No effusion. Progressive thoracic spondylitic change T8-9. Vertebral endplate spurring at multiple levels in the lower thoracic spine. IMPRESSION: 1. Mild right perihilar interstitial opacities. Electronically Signed   By: Corlis Leak M.D.   On: 07/12/2022 10:05    Procedures Procedures (including critical care time)  Medications Ordered in UC Medications - No data to display  Initial Impression / Assessment and Plan / UC Course  I have reviewed the triage vital signs and the nursing notes.  Pertinent labs & imaging results that were available during my care of the patient were reviewed by me and  considered in my medical decision making (see chart for details).    Right-sided pneumonia, productive cough, shortness of breath.  O2 sat 93% on room air.  Treating today with cefdinir and Zithromax.  Instructed patient to follow-up with his PCP tomorrow.  ED precautions discussed.  Education provided on pneumonia.  Patient is accompanied by his wife today.  They agree to plan of care.  Final Clinical Impressions(s) / UC Diagnoses   Final diagnoses:  Productive cough  Shortness of breath  Pneumonia of right lung due to infectious organism, unspecified part of lung     Discharge Instructions      Take the cefdinir and Zithromax as directed.  Follow up with your primary care provider tomorrow.  Go to the emergency department if your symptoms worsen.          ED Prescriptions     Medication Sig Dispense Auth. Provider   cefdinir (OMNICEF) 300 MG capsule Take 1 capsule (300 mg total) by mouth 2 (two) times daily for 7 days. 14 capsule Mickie Bail, NP   azithromycin (ZITHROMAX) 250 MG tablet Take 1 tablet (250 mg total) by mouth daily. Take first 2 tablets together, then 1 every day until finished. 6 tablet Mickie Bail, NP      PDMP not reviewed this encounter.   Mickie Bail, NP 07/12/22 1027

## 2022-07-14 ENCOUNTER — Ambulatory Visit (INDEPENDENT_AMBULATORY_CARE_PROVIDER_SITE_OTHER): Payer: Medicare Other | Admitting: Family Medicine

## 2022-07-14 ENCOUNTER — Encounter: Payer: Self-pay | Admitting: Family Medicine

## 2022-07-14 VITALS — BP 148/80 | HR 62 | Temp 97.8°F | Ht 72.0 in | Wt 211.1 lb

## 2022-07-14 DIAGNOSIS — J189 Pneumonia, unspecified organism: Secondary | ICD-10-CM | POA: Diagnosis not present

## 2022-07-14 MED ORDER — ALBUTEROL SULFATE HFA 108 (90 BASE) MCG/ACT IN AERS: 2.0000 | INHALATION_SPRAY | Freq: Four times a day (QID) | RESPIRATORY_TRACT | 2 refills | Status: DC | PRN

## 2022-07-14 MED ORDER — PROMETHAZINE-DM 6.25-15 MG/5ML PO SYRP: 5.0000 mL | ORAL_SOLUTION | Freq: Every evening | ORAL | 0 refills | Status: DC | PRN

## 2022-07-14 NOTE — Progress Notes (Unsigned)
Patient ID: GOVERNOR RAYGOZA, male    DOB: 1940/05/19, 82 y.o.   MRN: [id number]  This visit was conducted in person.  BP (!) 148/80 (BP Location: Left Arm, Patient Position: Sitting, Cuff Size: Large)   Pulse 62   Temp 97.8 F (36.6 C) (Temporal)   Ht 6' (1.829 m)   Wt 211 lb 2 oz (95.8 kg)   SpO2 94%   BMI 28.63 kg/m    CC:  Chief Complaint  Patient presents with   Hospitalization Follow-up    Urgent Care Follow up for PNA    Subjective:   HPI: TORRIAN WETZ is a 82 y.o. male presenting on 07/14/2022 for Hospitalization Follow-up (Urgent Care Follow up for PNA)   Had been having worsening cough. Right sided pneumonia, reviewed Urgent Care note from 07/12/22 Treated with cefdinir x 7 days and Zithromax x 5 days.  He is tolerating these antibiotics well.  No fever.   Still with coughing, yellow greyish mucus. Feeling some better  ( cough) but still wheezing and SOB.   Hx of CAD .[name] Has been SOB since.        Relevant past medical, surgical, family and social history reviewed and updated as indicated. Interim medical history since our last visit reviewed. Allergies and medications reviewed and updated. Outpatient Medications Prior to Visit  Medication Sig Dispense Refill   acetaminophen (TYLENOL) 500 MG tablet Take 1,000 mg by mouth every 6 (six) hours as needed for headache (pain).     aspirin EC 81 MG tablet Take 1 tablet (81 mg total) by mouth daily. 90 tablet 3   atorvastatin (LIPITOR) 40 MG tablet TAKE 1 TABLET BY MOUTH EVERY DAY 90 tablet 2   azithromycin (ZITHROMAX) 250 MG tablet Take 1 tablet (250 mg total) by mouth daily. Take first 2 tablets together, then 1 every day until finished. 6 tablet 0   cefdinir (OMNICEF) 300 MG capsule Take 1 capsule (300 mg total) by mouth 2 (two) times daily for 7 days. 14 capsule 0   clopidogrel (PLAVIX) 75 MG tablet TAKE 1 TABLET BY MOUTH EVERY DAY WITH BREAKFAST. *MUST KEEP UPCOMING APPT* 90 tablet 1   ezetimibe (ZETIA) 10 MG  tablet TAKE 1 TABLET BY MOUTH EVERY DAY 90 tablet 3   fenofibrate 160 MG tablet TAKE 1 TABLET BY MOUTH EVERY DAY 90 tablet 1   KRILL OIL PO Take by mouth 2 (two) times daily.     nebivolol (BYSTOLIC) 10 MG tablet TAKE 1 TABLET BY MOUTH EVERY DAY 90 tablet 3   nitroGLYCERIN (NITROSTAT) 0.4 MG SL tablet Place 1 tablet (0.4 mg total) under the tongue every 5 (five) minutes as needed for chest pain. 25 tablet 3   pantoprazole (PROTONIX) 40 MG tablet TAKE 1 TABLET BY MOUTH EVERY DAY 90 tablet 3   tamsulosin (FLOMAX) 0.4 MG CAPS capsule Take 0.4 mg by mouth in the morning and at bedtime.     tamsulosin (FLOMAX) 0.4 MG CAPS capsule TAKE 1 CAPSULE BY MOUTH DAILY AFTER SUPPER. (Patient taking differently: Take 0.4 mg by mouth. Take 1 tablet twice a day by mouth.) 90 capsule 1   traMADol (ULTRAM) 50 MG tablet Take 50 mg by mouth every 6 (six) hours as needed. (Patient not taking: Reported on 11/28/2021)     No facility-administered medications prior to visit.     Per HPI unless specifically indicated in ROS section below Review of Systems Objective:  BP (!) 148/80 (BP Location: Left Arm,  Patient Position: Sitting, Cuff Size: Large)   Pulse 62   Temp 97.8 F (36.6 C) (Temporal)   Ht 6' (1.829 m)   Wt 211 lb 2 oz (95.8 kg)   SpO2 94%   BMI 28.63 kg/m   Wt Readings from Last 3 Encounters:  07/14/22 211 lb 2 oz (95.8 kg)  12/30/21 210 lb (95.3 kg)  12/25/21 209 lb (94.8 kg)      Physical Exam    Results for orders placed or performed in visit on 12/25/21  ECHOCARDIOGRAM COMPLETE  Result Value Ref Range   Weight 3,344 oz   Height 72 in   Area-P 1/2 2.66 cm2   S' Lateral 3.50 cm    Assessment and Plan  There are no diagnoses linked to this encounter.  No follow-ups on file.   Kerby Nora, MD

## 2022-07-14 NOTE — Patient Instructions (Signed)
Complete antibiotics.  Can use albuterol 2 puffs inhaled every 4-6 hours as needed for wheeze.  Start cough suppressant at night.  Can use mucinex DM.[name] no decongestant.  If at the end antibiotics, if not improving significantly.. follow up for re-reassessment.

## 2022-07-15 NOTE — Assessment & Plan Note (Signed)
Acute, improving symptoms although slow with cefdinir and Zithromax.  Encouraged him to complete these antibiotics.  Prescription for cough suppressant at night to help him get rest.  Recommended continuing Mucinex DM during the day. Lung exam is clear to auscultation indication for repeat chest x-ray or broadening of antibiotics. Given he does report some reactive airway disease history and occasional wheezing but this current infection I will provide an inhaler of albuterol for him to use 2 puffs every 4-6 hours as needed for wheeze.  Return and ER precautions provided

## 2022-08-15 ENCOUNTER — Ambulatory Visit
Admission: EM | Admit: 2022-08-15 | Discharge: 2022-08-15 | Disposition: A | Discharge status: Home / Self Care | Payer: Medicare Other | Attending: Urgent Care | Admitting: Urgent Care

## 2022-08-15 ENCOUNTER — Ambulatory Visit (INDEPENDENT_AMBULATORY_CARE_PROVIDER_SITE_OTHER): Payer: Medicare Other

## 2022-08-15 DIAGNOSIS — J22 Unspecified acute lower respiratory infection: Secondary | ICD-10-CM | POA: Diagnosis not present

## 2022-08-15 DIAGNOSIS — J189 Pneumonia, unspecified organism: Secondary | ICD-10-CM

## 2022-08-15 DIAGNOSIS — R079 Chest pain, unspecified: Secondary | ICD-10-CM | POA: Diagnosis not present

## 2022-08-15 DIAGNOSIS — I771 Stricture of artery: Secondary | ICD-10-CM | POA: Diagnosis not present

## 2022-08-15 DIAGNOSIS — R0602 Shortness of breath: Secondary | ICD-10-CM

## 2022-08-15 DIAGNOSIS — I700 Atherosclerosis of aorta: Secondary | ICD-10-CM | POA: Diagnosis not present

## 2022-08-15 MED ORDER — GUAIFENESIN ER 600 MG PO TB12: 1200.0000 mg | ORAL_TABLET | Freq: Two times a day (BID) | ORAL | 0 refills | Status: DC | PRN

## 2022-08-15 MED ORDER — SULFAMETHOXAZOLE-TRIMETHOPRIM 800-160 MG PO TABS: 1.0000 | ORAL_TABLET | Freq: Two times a day (BID) | ORAL | 0 refills | Status: AC

## 2022-08-15 NOTE — ED Provider Notes (Signed)
Colin Lopez    CSN: [id number] Arrival date & time: 08/15/22  1518      History   Chief Complaint Chief Complaint  Patient presents with   Cough   Nasal Congestion    HPI Colin Lopez is a 82 y.o. male.   82 year old patient presents today due to concerns of continued pneumonia.  He was seen here in June and dx with R perihilar pneumonia. Due to his allergy profile list, was sent home on cefdinir BID x 7 days and zpack. Wife states he seemed to be improving, but never fully got over it. He continues to have a productive cough of thick yellow phlegm "looks like wallpaper glue." He feels intermittently short of breath. Wife was concerned that he was exposed to mold at a beach house and wonders if this could be causing the symptoms. Pt states that he is now having discomfort on both the R and L side. Does have hx of CAD. Describes the L sided pain as sharp and intermittent in nature, occurs sporadically and without relation to his coughing. Had several episodes of sharp chest pain while in waiting room but none at present time during evaluation. Pt does not smoke. Has inhalers at home, but does not take as they make him feel jittery. Hx of pulmonary nodule, which is stable/ shrinking. Reports chronic edema, but no worse than baseline. Also reports needing to sleep sitting propped up, but this also is unchanged from baseline. Denies paroxysmal nocturnal dyspnea. No hx of CHF.   Cough   Past Medical History:  Diagnosis Date   CAD (coronary artery disease), native coronary artery    09/09/16 PCI/DES x3 to mRCA, OM1 and dLcx/OM2.    Coronary disease    Status post stenting of the left circumflex coronary in 2009 with a bare-metal stent (with a 3.5x65mm Liberte stent)   Dyslipidemia    Dyspnea on exertion    Exertional chest pain    Exposure to TB    Hyperlipidemia    Hypertension    Hypertriglyceridemia    NSTEMI (non-ST elevated myocardial infarction) (HCC) 2009   BMS CFX    Numbness and tingling of left side of face    with left lip/face droop (Concern for Bell's Palsy)   Rash and nonspecific skin eruption    Lower right anterior leg   Stroke (HCC)    TIA (transient ischemic attack)    history of tia   Urinary tract infection     Patient Active Problem List   Diagnosis Date Noted   DOE (dyspnea on exertion) 03/13/2020   Upper airway cough syndrome 12/31/2016   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   Angina pectoris (HCC) 09/09/2016   Solitary pulmonary nodule s/p clinical CAP or asp sup segment L  04/20/2016   Insomnia 12/17/2014   Neuropathic pain of right foot 12/17/2014   Pneumonia of right lung due to infectious organism 07/11/2013   Coronary disease    Dyslipidemia    Essential hypertension     Past Surgical History:  Procedure Laterality Date   CARDIOVASCULAR STRESS TEST  10-03-08   EF 59%   CORONARY ANGIOPLASTY WITH STENT PLACEMENT  09/09/2016   CORONARY STENT INTERVENTION N/A 09/09/2016   Procedure: CORONARY STENT INTERVENTION;  Surgeon: Swazil[place], [name] M, MD;  Location: [facility] INVASIVE [facility];  Service: Cardiovascular;  Laterality: N/A;   LEFT HEART CATH AND CORONARY ANGIOGRAPHY N/A 09/09/2016   Procedure: LEFT HEART CATH AND CORONARY ANGIOGRAPHY;  Surgeon: [place], [name]  M, MD;  Location: [facility] INVASIVE CV [facility];  Service: Cardiovascular;  Laterality: N/A;   US ECHOCARDIOGRAPHY  09-21-08   EF 55-60%       Home Medications    Prior to Admission medications   Medication Sig Start Date End Date Taking? Authorizing Provider  acetaminophen (TYLENOL) 500 MG tablet Take 1,000 mg by mouth every 6 (six) hours as needed for headache (pain).    [provider]  albuterol (VENTOLIN HFA) 108 (90 Base) MCG/ACT inhaler Inhale 2 puffs into the lungs every 6 (six) hours as needed for wheezing or shortness of breath. 07/14/22   Bedsole, Amy E, MD  aspirin EC 81 MG tablet Take 1 tablet (81 mg total) by mouth daily. 11/06/13   [place], [name] M, MD   atorvastatin (LIPITOR) 40 MG tablet TAKE 1 TABLET BY MOUTH EVERY DAY 05/20/22   [place], [name] M, MD  azithromycin (ZITHROMAX) 250 MG tablet Take 1 tablet (250 mg total) by mouth daily. Take first 2 tablets together, then 1 every day until finished. 07/12/22   Mickie Bail, NP  clopidogrel (PLAVIX) 75 MG tablet TAKE 1 TABLET BY MOUTH EVERY DAY WITH BREAKFAST. *MUST KEEP UPCOMING APPT* 03/23/22   [place], [name] M, MD  ezetimibe (ZETIA) 10 MG tablet TAKE 1 TABLET BY MOUTH EVERY DAY 05/20/22   [place], [name] M, MD  fenofibrate 160 MG tablet TAKE 1 TABLET BY MOUTH EVERY DAY 06/18/22   [place], [name] M, MD  KRILL OIL PO Take by mouth 2 (two) times daily.    [provider]  nebivolol (BYSTOLIC) 10 MG tablet TAKE 1 TABLET BY MOUTH EVERY DAY 02/23/22   [place], [name] M, MD  nitroGLYCERIN (NITROSTAT) 0.4 MG SL tablet Place 1 tablet (0.4 mg total) under the tongue every 5 (five) minutes as needed for chest pain. 12/29/18   [place], [name] M, MD  pantoprazole (PROTONIX) 40 MG tablet TAKE 1 TABLET BY MOUTH EVERY DAY 02/23/22   [place], [name] M, MD  promethazine-dextromethorphan (PROMETHAZINE-DM) 6.25-15 MG/5ML syrup Take 5 mLs by mouth at bedtime as needed for cough. 07/14/22   Bedsole, Amy E, MD  tamsulosin (FLOMAX) 0.4 MG CAPS capsule Take 0.4 mg by mouth in the morning and at bedtime.    [provider]    Family History Family History  Problem Relation Age of Onset   Diabetes Mother    CAD Sister 83       MI, obese   Cancer Brother        stomach    Social History Social History   Tobacco Use   Smoking status: Never   Smokeless tobacco: Never  Vaping Use   Vaping status: Never Used  Substance Use Topics   Alcohol use: Yes    Comment: Rarely    Drug use: No     Allergies   Penicillins, Doxycycline, and Levaquin [levofloxacin in d5w]   Review of Systems Review of Systems  Respiratory:  Positive for cough.      Physical Exam Triage Vital Signs ED Triage Vitals  Encounter  Vitals Group     BP 08/15/22 1604 (!) 154/69     Systolic BP Percentile --      Diastolic BP Percentile --      Pulse Rate 08/15/22 1604 69     Resp 08/15/22 1604 18     Temp 08/15/22 1604 97.8 F (36.6 C)     Temp Source 08/15/22 1604 Temporal     SpO2 08/15/22 1604 93 %  Weight --      Height --      Head Circumference --      Peak Flow --      Pain Score 08/15/22 1602 8     Pain Loc --      Pain Education --      Exclude from Growth Chart --    No data found.  Updated Vital Signs BP (!) 154/69 (BP Location: Left Arm)   Pulse 69   Temp 97.8 F (36.6 C) (Temporal)   Resp 18   SpO2 93%   Visual Acuity Right Eye Distance:   Left Eye Distance:   Bilateral Distance:    Right Eye Near:   Left Eye Near:    Bilateral Near:     Physical Exam   UC Treatments / Results  Labs (all labs ordered are listed, but only abnormal results are displayed) Labs Reviewed - No data to display  EKG NSR, rate 67bpm. No acute changes compared to 11/28/21.  Radiology DG Chest 2 View  Result Date: 08/15/2022 CLINICAL DATA:  Follow-up right-sided pneumonia. Persistent chest pain and shortness of breath EXAM: CHEST - 2 VIEW COMPARISON:  07/12/2022 FINDINGS: Heart size is normal. Aortic atherosclerosis and tortuosity as seen previously. No active pneumonia is identified on today's study. Some chronic sclerotic density associated with the right lateral fifth rib is unchanged and benign. Pulmonary nodular shadow posteriorly in the left lower lobe is unchanged, marked with an arrow. No pleural effusion. Ordinary chronic degenerative changes affect the spine. IMPRESSION: 1. No active process identified. No evidence of active pneumonia on today's study. 2. Aortic atherosclerosis and tortuosity. 3. Stable nodular shadow posteriorly in the left lower lobe. Electronically Signed   By: Paulina Fusi M.D.   On: 08/15/2022 16:28    Procedures Procedures (including critical care time)  Medications  Ordered in UC Medications - No data to display  Initial Impression / Assessment and Plan / UC Course  I have reviewed the triage vital signs and the nursing notes.  Pertinent labs & imaging results that were available during my care of the patient were reviewed by me and considered in my medical decision making (see chart for details).     *** Final Clinical Impressions(s) / UC Diagnoses   Final diagnoses:  Pneumonia of right lower lobe due to infectious organism   Discharge Instructions   None    ED Prescriptions   None    PDMP not reviewed this encounter.

## 2022-08-15 NOTE — Discharge Instructions (Signed)
Your EKG looks stable compared to prior. Your chest x-ray does not show any worsening pneumonia. Please start taking Bactrim twice daily.  Drink plenty of water while on this medication.  Please avoid excessive sun exposure while on this medication.  Please also take the Mucinex twice daily.  You must drink plenty of water or help with not thin your secretions.  Please collect your sputum in the receptacle provided and take to your pulmonologist.  Please call your pulmonologist on Monday to schedule a follow-up

## 2022-08-15 NOTE — ED Triage Notes (Signed)
Patient presents to UC for productive cough, increased SOB, hoarseness, sore throat, and left sided rib/back pain since Thursday. States he had pneumonia approx 7 weeks ago. He has an inhaler to use prefers not to use due to side effects.   Denies fever.

## 2022-09-07 ENCOUNTER — Ambulatory Visit: Payer: Medicare Other | Admitting: Internal Medicine

## 2022-09-13 ENCOUNTER — Other Ambulatory Visit: Payer: Self-pay | Admitting: Cardiology

## 2022-09-22 ENCOUNTER — Encounter: Payer: Self-pay | Admitting: Nurse Practitioner

## 2022-09-22 ENCOUNTER — Ambulatory Visit (INDEPENDENT_AMBULATORY_CARE_PROVIDER_SITE_OTHER)
Admission: RE | Admit: 2022-09-22 | Discharge: 2022-09-22 | Disposition: A | Discharge status: Home / Self Care | Payer: Medicare Other | Source: Ambulatory Visit | Attending: Nurse Practitioner

## 2022-09-22 ENCOUNTER — Ambulatory Visit (INDEPENDENT_AMBULATORY_CARE_PROVIDER_SITE_OTHER): Payer: Medicare Other | Admitting: Nurse Practitioner

## 2022-09-22 VITALS — BP 116/80 | HR 60 | Temp 97.4°F | Ht 69.0 in | Wt 210.6 lb

## 2022-09-22 DIAGNOSIS — Z8673 Personal history of transient ischemic attack (TIA), and cerebral infarction without residual deficits: Secondary | ICD-10-CM

## 2022-09-22 DIAGNOSIS — M79642 Pain in left hand: Secondary | ICD-10-CM | POA: Diagnosis not present

## 2022-09-22 DIAGNOSIS — R079 Chest pain, unspecified: Secondary | ICD-10-CM | POA: Diagnosis not present

## 2022-09-22 DIAGNOSIS — I700 Atherosclerosis of aorta: Secondary | ICD-10-CM | POA: Diagnosis not present

## 2022-09-22 DIAGNOSIS — I10 Essential (primary) hypertension: Secondary | ICD-10-CM

## 2022-09-22 DIAGNOSIS — R0602 Shortness of breath: Secondary | ICD-10-CM

## 2022-09-22 DIAGNOSIS — R059 Cough, unspecified: Secondary | ICD-10-CM | POA: Diagnosis not present

## 2022-09-22 DIAGNOSIS — R600 Localized edema: Secondary | ICD-10-CM | POA: Insufficient documentation

## 2022-09-22 DIAGNOSIS — I639 Cerebral infarction, unspecified: Secondary | ICD-10-CM

## 2022-09-22 DIAGNOSIS — M79641 Pain in right hand: Secondary | ICD-10-CM | POA: Diagnosis not present

## 2022-09-22 LAB — COMPREHENSIVE METABOLIC PANEL
ALT: 15 U/L (ref 0–53)
AST: 19 U/L (ref 0–37)
Albumin: 3.6 g/dL (ref 3.5–5.2)
Alkaline Phosphatase: 41 U/L (ref 39–117)
BUN: 26 mg/dL — ABNORMAL HIGH (ref 6–23)
CO2: 26 meq/L (ref 19–32)
Calcium: 9.1 mg/dL (ref 8.4–10.5)
Chloride: 106 meq/L (ref 96–112)
Creatinine, Ser: 1.33 mg/dL (ref 0.40–1.50)
GFR: 49.66 mL/min — ABNORMAL LOW (ref >60.00)
Glucose, Bld: 88 mg/dL (ref 70–99)
Potassium: 4.4 meq/L (ref 3.5–5.1)
Sodium: 139 meq/L (ref 135–145)
Total Bilirubin: 0.6 mg/dL (ref 0.2–1.2)
Total Protein: 6.2 g/dL (ref 6.0–8.3)

## 2022-09-22 LAB — TSH: TSH: 3.25 u[IU]/mL (ref 0.35–5.50)

## 2022-09-22 LAB — BRAIN NATRIURETIC PEPTIDE: Pro B Natriuretic peptide (BNP): 55 pg/mL (ref 0.0–100.0)

## 2022-09-22 LAB — CBC
HCT: 41 % (ref 39.0–52.0)
Hemoglobin: 13.4 g/dL (ref 13.0–17.0)
MCHC: 32.6 g/dL (ref 30.0–36.0)
MCV: 93 fl (ref 78.0–100.0)
Platelets: 134 10*3/uL — ABNORMAL LOW (ref 150.0–400.0)
RBC: 4.41 Mil/uL (ref 4.22–5.81)
RDW: 15.5 % (ref 11.5–15.5)
WBC: 7.3 10*3/uL (ref 4.0–10.5)

## 2022-09-22 NOTE — Assessment & Plan Note (Signed)
History of the same.  Patient is on clopidogrel, aspirin, atorvastatin.

## 2022-09-22 NOTE — Assessment & Plan Note (Signed)
Intermittent not requiring nitroglycerin.  EKG within normal limits.

## 2022-09-22 NOTE — Assessment & Plan Note (Signed)
Bilateral lower extremity edema we reviewed patient's last echocardiogram.  Pending labs inclusive of BNP today

## 2022-09-22 NOTE — Progress Notes (Signed)
Established Patient Office Visit  Subjective   Patient ID: Colin Lopez, male    DOB: [birth year]  Age: 82 y.o. MRN: [id number]  Chief Complaint  Patient presents with   Hip and joint pain    Pt complains of severe hip and joint pain. Hard to walk. Going on for 9 months. Pt states he has not been able to work.    wrist and hand pain    Pt complains of not being able to close hands. Fingers appear to look crooked, pt complains of stinging and burning pain. Pain level 9-10   Pneumonia    Pt complains of breathing not getting any better since having pneumonia. 8/3 ER visit. Slight chest pain.    Transfer of care: last seen by amy bedsole on 07/14/2022 Last seen by Nicki Reaper 06/01/2019   Hand pian: states has been 9 months and is getting worse. States that he does have stiffness and pain. No warnth per patient. He will take tylenol that will help for s hosrt period. Decreased grips  Hosptial follow up: states that he is having trouble breathing. States that he has had trouble breathing for over a year. States that he cannot sleep flat for the past 10 years. States that he does have some fluid. Dyspnea that is worse with exertion. States that he has been having a cough that is coming up with clear thick sputum. States that he has tried the albuterol pump a couple times with no relief.  Patient states the shortness of breath has been going on for approximately 9 months also  HTN: bystolic  CVA: atorvastatin 40 and plavix.   CAD: patient is followed by cardioyg Dr. Swaziland and Dinah Beers cleaver. Plavix and atorvastatin   Patient is followed by pulmonary Dr. Casimiro Needle wert. Last seen on 05/20/2020     Review of Systems  Constitutional:  Negative for chills and fever.  Respiratory:  Positive for cough, sputum production and shortness of breath.   Cardiovascular:  Positive for chest pain.  Gastrointestinal:  Negative for abdominal pain, diarrhea, nausea and vomiting.       BM daily to  every other day   Genitourinary:  Negative for dysuria and hematuria.  Neurological:  Negative for headaches.  Psychiatric/Behavioral:  Negative for hallucinations and suicidal ideas.       Objective:     BP 116/80   Pulse 60   Temp (!) 97.4 F (36.3 C) (Temporal)   Ht 5\' 9"  (1.753 m)   Wt 210 lb 9.6 oz (95.5 kg)   SpO2 94%   BMI 31.10 kg/m  BP Readings from Last 3 Encounters:  09/22/22 116/80  08/15/22 (!) 154/69  07/14/22 (!) 148/80   Wt Readings from Last 3 Encounters:  09/22/22 210 lb 9.6 oz (95.5 kg)  07/14/22 211 lb 2 oz (95.8 kg)  12/30/21 210 lb (95.3 kg)   SpO2 Readings from Last 3 Encounters:  09/22/22 94%  08/15/22 93%  07/14/22 94%      Physical Exam Vitals and nursing note reviewed.  Constitutional:      Appearance: Normal appearance.  HENT:     Right Ear: Tympanic membrane, ear canal and external ear normal.     Left Ear: Tympanic membrane, ear canal and external ear normal.     Mouth/Throat:     Mouth: Mucous membranes are moist.     Pharynx: Oropharynx is clear.  Cardiovascular:     Rate and Rhythm: Normal rate and regular rhythm.  Pulses:          Dorsalis pedis pulses are 1+ on the right side and 1+ on the left side.     Heart sounds: Normal heart sounds.  Pulmonary:     Effort: Pulmonary effort is normal.     Breath sounds: Normal breath sounds.  Musculoskeletal:     Right hand: Deformity present. Decreased range of motion. Decreased strength. Normal pulse.     Left hand: Deformity present. Decreased range of motion. Decreased strength. Normal pulse.     Right lower leg: 2+ Edema present.     Left lower leg: 2+ Edema present.     Comments: Negative tinel sign bilaterally   Lymphadenopathy:     Cervical: No cervical adenopathy.  Neurological:     Mental Status: He is alert.      No results found for any visits on 09/22/22.    The ASCVD Risk score (Arnett DK, et al., 2019) failed to calculate for the following reasons:    The 2019 ASCVD risk score is only valid for ages 61 to 49   The patient has a prior MI or stroke diagnosis    Assessment & Plan:   Problem List Items Addressed This Visit       Cardiovascular and Mediastinum   Essential hypertension (Chronic)    Patient currently on Bystolic.  He is followed by cardiology.  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  Patient is on clopidogrel, aspirin, atorvastatin.        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  Does have some deformity present has been a posterior for 65 years.  Pending RF factor      Relevant Orders   Rheumatoid factor   Chest pain    Intermittent not requiring nitroglycerin.  EKG within normal limits.      Relevant Orders   EKG 12-Lead (Completed)   DG Chest 2 View   CBC   Comprehensive metabolic panel   TSH   Shortness of breath - Primary    History of the same.  Patient is followed by pulmonology.  Has tried albuterol inhaler without relief.  EKG within normal limits pending chest x-ray and labs      Relevant Orders   EKG 12-Lead (Completed)   DG Chest 2 View   CBC   Comprehensive metabolic panel   Brain natriuretic peptide   TSH   Lower extremity edema    Bilateral lower extremity edema we reviewed patient's last echocardiogram.  Pending labs inclusive of BNP today      Relevant Orders   CBC   Comprehensive metabolic panel   Brain natriuretic peptide   TSH    Return if symptoms worsen or fail to improve.    Audria Nine, NP

## 2022-09-22 NOTE — Assessment & Plan Note (Signed)
History of the same.  Patient is followed by pulmonology.  Has tried albuterol inhaler without relief.  EKG within normal limits pending chest x-ray and labs

## 2022-09-22 NOTE — Patient Instructions (Signed)
Ice to see you today I will be in touch with the labs and xrays once I have them Follow up TDB on labs

## 2022-09-22 NOTE — Assessment & Plan Note (Signed)
Patient currently on Bystolic.  He is followed by cardiology.  Continue medication as prescribed

## 2022-09-22 NOTE — Assessment & Plan Note (Signed)
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  Does have some deformity present has been a posterior for 65 years.  Pending RF factor

## 2022-09-23 LAB — RHEUMATOID FACTOR: Rheumatoid fact SerPl-aCnc: 15 [IU]/mL — ABNORMAL HIGH (ref <14)

## 2022-10-02 ENCOUNTER — Telehealth: Payer: Self-pay | Admitting: Internal Medicine

## 2022-10-02 NOTE — Telephone Encounter (Signed)
Pt wife said she missed your call and please give them a call back

## 2022-10-02 NOTE — Telephone Encounter (Signed)
See lab results for documentation.

## 2022-10-05 NOTE — Telephone Encounter (Signed)
Called patient he is not sure if he would like to get xray. He will talk about it to his wife and let us know if he would like to have xray.

## 2022-10-05 NOTE — Telephone Encounter (Signed)
Pt called back for lab results. Told pt cable's response. Pt's wife asked if everything is normal, why are his hands the way they are? Call back # [phone]

## 2022-10-05 NOTE — Telephone Encounter (Signed)
The lab test just checked for Rheumatoid arthritis. We can get xrays of the hands to investigate further if they would like

## 2022-10-12 ENCOUNTER — Telehealth: Payer: Self-pay | Admitting: Nurse Practitioner

## 2022-10-12 ENCOUNTER — Encounter: Payer: Self-pay | Admitting: Nurse Practitioner

## 2022-10-12 NOTE — Telephone Encounter (Signed)
noted 

## 2022-11-06 ENCOUNTER — Ambulatory Visit
Admission: EM | Admit: 2022-11-06 | Discharge: 2022-11-06 | Disposition: A | Discharge status: Home / Self Care | Payer: Medicare Other | Attending: Emergency Medicine | Admitting: Emergency Medicine

## 2022-11-06 ENCOUNTER — Encounter: Payer: Self-pay | Admitting: Emergency Medicine

## 2022-11-06 DIAGNOSIS — J069 Acute upper respiratory infection, unspecified: Secondary | ICD-10-CM | POA: Insufficient documentation

## 2022-11-06 DIAGNOSIS — R350 Frequency of micturition: Secondary | ICD-10-CM | POA: Insufficient documentation

## 2022-11-06 LAB — POCT URINALYSIS DIP (MANUAL ENTRY)
Bilirubin, UA: NEGATIVE
Glucose, UA: NEGATIVE mg/dL
Ketones, POC UA: NEGATIVE mg/dL
Nitrite, UA: NEGATIVE
Spec Grav, UA: 1.025 (ref 1.010–1.025)
Urobilinogen, UA: 0.2 U/dL
pH, UA: 6.5 (ref 5.0–8.0)

## 2022-11-06 MED ORDER — IPRATROPIUM BROMIDE 0.03 % NA SOLN: 2.0000 | Freq: Two times a day (BID) | NASAL | 0 refills | Status: DC

## 2022-11-06 MED ORDER — NYSTATIN 100000 UNIT/ML MT SUSP: 5.0000 mL | Freq: Four times a day (QID) | OROMUCOSAL | 1 refills | Status: DC | PRN

## 2022-11-06 MED ORDER — SULFAMETHOXAZOLE-TRIMETHOPRIM 800-160 MG PO TABS: 1.0000 | ORAL_TABLET | Freq: Two times a day (BID) | ORAL | 0 refills | Status: AC

## 2022-11-06 NOTE — ED Triage Notes (Signed)
Pt c/o dysuria and urinary frequency for 6-7 weeks.

## 2022-11-06 NOTE — Discharge Instructions (Signed)
Today you have been evaluated for your symptoms, today we will initiate treatments and attempt to help reduce or resolve symptoms  Urinalysis shows Temesgen Weightman blood cells but at this time does not show bacteria, sample has been sent to the lab to see if bacterial growth will occur, if any changes need to be made to your medication based on sample you will be notified any medicine sent to the pharmacy  As your symptoms began 7 weeks ago we will provide coverage for bacteria to the upper airways and sinuses  Begin Bactrim taking every morning and every evening for 7 days, you have used this medicine in the past for respiratory illness and have had success with this, this medicine is also good for the bladder  In an attempt to reduce the foul taste and smell that you are next experiencing  You may use nasal spray every morning and every evening as needed  You may also use saline mist nasal spray as needed, may also attempt use of saline irrigation, Veneta Penton may be purchased at [employer], may get at [facility] while you are picking up your prescription  Gargle and spit Magic mouthwash solution every 4 hours, ideally this will reduce the foul taste within your mouth  Keep upcoming appointments with urologist, pulmonologist, may also schedule follow-up appointment with your primary doctor  You have been given information to the ear nose and throat specialist for follow-up and further evaluation

## 2022-11-07 NOTE — ED Provider Notes (Signed)
Colin Lopez    CSN: [id number] Arrival date & time: 11/06/22  1413      History   Chief Complaint Chief Complaint  Patient presents with   Dysuria    HPI Colin Lopez is a 82 y.o. male.   Patient presents for evaluation of a persistent altered senses for 7 weeks after spraying chemical Lysol.  Endorses this is all he is able to taste and smell at all times.  Endorses this is occurring to his mucous, food, urine.  At the same time began to experience dysuria and urinary frequency.  Associated nasal congestion and a nonproductive cough.  Endorses his 2 sons had similar respiratory symptoms of congestion and cough after spraying chemical but have recovered well his symptoms have continued to persist.  Have become unbearable.  Did schedule appointment with pulmonologist to be seen this upcoming Tuesday.  Did schedule appointment with urologist but unable to be seen until next month.  Has not attempted treatment of symptoms.  Denies fever.  Endorses shortness of breath and wheezing at baseline, has not worsened.  Past Medical History:  Diagnosis Date   CAD (coronary artery disease), native coronary artery    09/09/16 PCI/DES x3 to mRCA, OM1 and dLcx/OM2.    Coronary disease    Status post stenting of the left circumflex coronary in 2009 with a bare-metal stent (with a 3.5x86mm Liberte stent)   Dyslipidemia    Dyspnea on exertion    Exertional chest pain    Exposure to TB    Hyperlipidemia    Hypertension    Hypertriglyceridemia    NSTEMI (non-ST elevated myocardial infarction) (HCC) 2009   BMS CFX   Numbness and tingling of left side of face    with left lip/face droop (Concern for Bell's Palsy)   Rash and nonspecific skin eruption    Lower right anterior leg   Stroke (HCC)    TIA (transient ischemic attack)    history of tia   Urinary tract infection     Patient Active Problem List   Diagnosis Date Noted   Lower extremity edema 09/22/2022   Shortness of breath  03/13/2020   Upper airway cough syndrome 12/31/2016   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   Chest pain 09/09/2016   Bilateral hand pain 07/24/2016   Solitary pulmonary nodule s/p clinical CAP or asp sup segment L  04/20/2016   Insomnia 12/17/2014   Neuropathic pain of right foot 12/17/2014   Pneumonia of right lung due to infectious organism 07/11/2013   Coronary disease    Dyslipidemia    Essential hypertension     Past Surgical History:  Procedure Laterality Date   CARDIOVASCULAR STRESS TEST  10-03-08   EF 59%   CORONARY ANGIOPLASTY WITH STENT PLACEMENT  09/09/2016   CORONARY STENT INTERVENTION N/A 09/09/2016   Procedure: CORONARY STENT INTERVENTION;  Surgeon: Swaziland, [name] M, MD;  Location: [facility] INVASIVE CV LAB;  Service: Cardiovascular;  Laterality: N/A;   LEFT HEART CATH AND CORONARY ANGIOGRAPHY N/A 09/09/2016   Procedure: LEFT HEART CATH AND CORONARY ANGIOGRAPHY;  Surgeon: Swaziland, [name] M, MD;  Location: [facility] INVASIVE CV LAB;  Service: Cardiovascular;  Laterality: N/A;   US ECHOCARDIOGRAPHY  09-21-08   EF 55-60%       Home Medications    Prior to Admission medications   Medication Sig Start Date End Date Taking? Authorizing Provider  aspirin EC 81 MG tablet Take 1 tablet (81 mg total) by mouth daily. 11/06/13  Yes [place],  Demetria Pore, MD  atorvastatin (LIPITOR) 40 MG tablet TAKE 1 TABLET BY MOUTH EVERY DAY 05/20/22  Yes [place], [name] M, MD  clopidogrel (PLAVIX) 75 MG tablet TAKE 1 TABLET BY MOUTH EVERY DAY WITH BREAKFAST. *MUST KEEP UPCOMING APPT* 09/15/22  Yes [place]azil[place], [name] M, MD  ezetimibe (ZETIA) 10 MG tablet TAKE 1 TABLET BY MOUTH EVERY DAY 05/20/22  Yes [place]azil[place], [name] M, MD  fenofibrate 160 MG tablet TAKE 1 TABLET BY MOUTH EVERY DAY 06/18/22  Yes [place], [name] M, MD  ipratropium (ATROVENT) 0.03 % nasal spray Place 2 sprays into both nostrils every 12 (twelve) hours. 11/06/22  Yes Rona Tomson R, NP  KRILL OIL PO Take by mouth 2 (two) times daily.   Yes [provider]  magic  mouthwash (nystatin, lidocaine, diphenhydrAMINE, alum & mag hydroxide) suspension Swish and spit 5 mLs 4 (four) times daily as needed for mouth pain. 11/06/22  Yes Shiva Sahagian R, NP  nebivolol (BYSTOLIC) 10 MG tablet TAKE 1 TABLET BY MOUTH EVERY DAY 02/23/22  Yes [place], [name] M, MD  pantoprazole (PROTONIX) 40 MG tablet TAKE 1 TABLET BY MOUTH EVERY DAY 02/23/22  Yes [place], [name] M, MD  sulfamethoxazole-trimethoprim (BACTRIM DS) 800-160 MG tablet Take 1 tablet by mouth 2 (two) times daily for 7 days. 11/06/22 11/13/22 Yes Jaynell Castagnola, Elita Boone, NP  acetaminophen (TYLENOL) 500 MG tablet Take 1,000 mg by mouth every 6 (six) hours as needed for headache (pain).    [provider]  albuterol (VENTOLIN HFA) 108 (90 Base) MCG/ACT inhaler Inhale 2 puffs into the lungs every 6 (six) hours as needed for wheezing or shortness of breath. Patient not taking: Reported on 09/22/2022 07/14/22   Excell Seltzer, MD  guaiFENesin (MUCINEX) 600 MG 12 hr tablet Take 2 tablets (1,200 mg total) by mouth 2 (two) times daily as needed for cough or to loosen phlegm. Patient not taking: Reported on 09/22/2022 08/15/22   Guy Sandifer L, PA  nitroGLYCERIN (NITROSTAT) 0.4 MG SL tablet Place 1 tablet (0.4 mg total) under the tongue every 5 (five) minutes as needed for chest pain. Patient not taking: Reported on 09/22/2022 12/29/18   Swaziland, [name] M, MD  promethazine-dextromethorphan (PROMETHAZINE-DM) 6.25-15 MG/5ML syrup Take 5 mLs by mouth at bedtime as needed for cough. 07/14/22   Bedsole, Amy E, MD  tamsulosin (FLOMAX) 0.4 MG CAPS capsule Take 0.4 mg by mouth in the morning and at bedtime.    [provider]    Family History Family History  Problem Relation Age of Onset   Diabetes Mother    CAD Sister 60       MI, obese   Cancer Brother        stomach    Social History Social History   Tobacco Use   Smoking status: Never   Smokeless tobacco: Never  Vaping Use   Vaping status: Never Used  Substance Use  Topics   Alcohol use: Yes    Comment: Rarely    Drug use: No     Allergies   Penicillins, Doxycycline, and Levaquin [levofloxacin in d5w]   Review of Systems Review of Systems  Genitourinary:  Positive for dysuria.     Physical Exam Triage Vital Signs ED Triage Vitals [11/06/22 1516]  Encounter Vitals Group     BP (!) 148/89     Systolic BP Percentile      Diastolic BP Percentile      Pulse Rate 64     Resp  Temp 97.9 F (36.6 C)     Temp Source Oral     SpO2 94 %     Weight      Height      Head Circumference      Peak Flow      Pain Score      Pain Loc      Pain Education      Exclude from Growth Chart    No data found.  Updated Vital Signs BP (!) 148/89   Pulse 64   Temp 97.9 F (36.6 C) (Oral)   SpO2 94%   Visual Acuity Right Eye Distance:   Left Eye Distance:   Bilateral Distance:    Right Eye Near:   Left Eye Near:    Bilateral Near:     Physical Exam Constitutional:      Appearance: Normal appearance.  HENT:     Head: Normocephalic.     Right Ear: Tympanic membrane, ear canal and external ear normal.     Left Ear: Tympanic membrane, ear canal and external ear normal.     Nose: Congestion present. No rhinorrhea.     Mouth/Throat:     Mouth: Mucous membranes are moist.     Pharynx: Oropharynx is clear. No oropharyngeal exudate or posterior oropharyngeal erythema.  Eyes:     Extraocular Movements: Extraocular movements intact.  Cardiovascular:     Rate and Rhythm: Normal rate and regular rhythm.     Pulses: Normal pulses.     Heart sounds: Normal heart sounds.  Pulmonary:     Effort: Pulmonary effort is normal.     Breath sounds: Normal breath sounds.  Skin:    General: Skin is warm and dry.  Neurological:     Mental Status: He is alert and oriented to person, place, and time. Mental status is at baseline.      UC Treatments / Results  Labs (all labs ordered are listed, but only abnormal results are displayed) Labs  Reviewed  POCT URINALYSIS DIP (MANUAL ENTRY) - Abnormal; Notable for the following components:      Result Value   Clarity, UA cloudy (*)    Blood, UA small (*)    Protein Ur, POC trace (*)    Leukocytes, UA Large (3+) (*)    All other components within normal limits  URINE CULTURE    EKG   Radiology No results found.  Procedures Procedures (including critical care time)  Medications Ordered in UC Medications - No data to display  Initial Impression / Assessment and Plan / UC Course  I have reviewed the triage vital signs and the nursing notes.  Pertinent labs & imaging results that were available during my care of the patient were reviewed by me and considered in my medical decision making (see chart for details).  Acute URI, urinary frequency  Vital signs are stable and patient is in no signs of distress nontoxic-appearing, congestion noted to the nasal turbinates, no sinus tenderness noted on exam, lungs are clear but diminished, denies worsening shortness of breath and wheezing, declined chest x-ray imaging at this time as he is having an  upcoming pulmonology appointment, as his sons had similar symptoms initially possible upper respiratory infection that has persisted therefore we will provide bacterial coverage, discussed this with patient and spouse, urinalysis showing large leukocytes, negative for nitrates, has been sent for culture, prescribed Bactrim for treatment, has been used in the past for an upper respiratory infection for this patient  will also provide double coverage for the bladder, additionally prescribed Atrovent nasal spray and Magic mouthwash and attempts to give some relief to altered taste and smell, discussed with patient that this may not provide relief but is in an attempt to reduce symptoms, advised keeping upcoming follow-up appointments and given walking referral to ear nose and throat Final Clinical Impressions(s) / UC Diagnoses   Final diagnoses:   Acute URI  Urinary frequency     Discharge Instructions      Today you have been evaluated for your symptoms, today we will initiate treatments and attempt to help reduce or resolve symptoms  Urinalysis shows Luvina Poirier blood cells but at this time does not show bacteria, sample has been sent to the lab to see if bacterial growth will occur, if any changes need to be made to your medication based on sample you will be notified any medicine sent to the pharmacy  As your symptoms began 7 weeks ago we will provide coverage for bacteria to the upper airways and sinuses  Begin Bactrim taking every morning and every evening for 7 days, you have used this medicine in the past for respiratory illness and have had success with this, this medicine is also good for the bladder  In an attempt to reduce the foul taste and smell that you are next experiencing  You may use nasal spray every morning and every evening as needed  You may also use saline mist nasal spray as needed, may also attempt use of saline irrigation, Veneta Penton may be purchased at [employer], [facility] you are picking up your prescription  Gargle and spit Magic mouthwash solution every 4 hours, ideally this will reduce the foul taste within your mouth  Keep upcoming appointments with urologist, pulmonologist, may also schedule follow-up appointment with your primary doctor  You have been given information to the ear nose and throat specialist for follow-up and further evaluation     ED Prescriptions     Medication Sig Dispense Auth. Provider   sulfamethoxazole-trimethoprim (BACTRIM DS) 800-160 MG tablet Take 1 tablet by mouth 2 (two) times daily for 7 days. 14 tablet Adea Geisel R, NP   ipratropium (ATROVENT) 0.03 % nasal spray Place 2 sprays into both nostrils every 12 (twelve) hours. 30 mL Salli Quarry R, NP   magic mouthwash (nystatin, lidocaine, diphenhydrAMINE, alum & mag hydroxide) suspension  Swish and spit 5 mLs 4 (four) times daily as needed for mouth pain. 180 mL Valinda Hoar, NP      PDMP not reviewed this encounter.   Valinda Hoar, [place] 11/07/22 [phone]

## 2022-11-08 LAB — URINE CULTURE: Culture: >=100000 — AB

## 2022-11-10 ENCOUNTER — Encounter: Payer: Self-pay | Admitting: Internal Medicine

## 2022-11-10 ENCOUNTER — Ambulatory Visit: Payer: Medicare Other | Admitting: Internal Medicine

## 2022-11-10 VITALS — BP 134/80 | HR 66 | Temp 98.0°F | Ht 72.0 in | Wt 208.0 lb

## 2022-11-10 DIAGNOSIS — R058 Other specified cough: Secondary | ICD-10-CM

## 2022-11-10 DIAGNOSIS — R0609 Other forms of dyspnea: Secondary | ICD-10-CM

## 2022-11-10 NOTE — Patient Instructions (Signed)
Pantoprazole (protonix) 40 mg   Take  30-60 min before first meal of the day and Pepcid (famotidine)  20 mg after supper until return to office - this is the best way to tell whether stomach acid is contributing to your problem.    GERD (REFLUX)  is an extremely common cause of respiratory symptoms just like yours , many times with no obvious heartburn at all.    It can be treated with medication, but also with lifestyle changes including elevation of the head of your bed (ideally with 6 -8inch blocks under the headboard of your bed),  Smoking cessation, avoidance of late meals, excessive alcohol, and avoid fatty foods, chocolate, peppermint, colas, red wine, and acidic juices such as orange juice.  NO MINT OR MENTHOL PRODUCTS SO NO COUGH DROPS  USE SUGARLESS CANDY INSTEAD (Jolley ranchers or Stover's or Life Savers) or even ice chips will also do - the key is to swallow to prevent all throat clearing. NO OIL BASED VITAMINS - use powdered substitutes.  Avoid fish oil when coughing.   My office will be contacting you by phone for referral for Sinus CT and ENT evaluation   - if you don't hear back from my office within one week please call us back or notify us thru MyChart and we'll address it right away.

## 2022-11-10 NOTE — Progress Notes (Signed)
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  Dyspnea:  MMRC1 = can walk nl pace, flat grade, can't hurry or go uphills or steps s sob   Cough: feels like getting choked  Sleeping: fine bed flat 4 pillows  SABA use: none  02: none  rec Stop prinivil (lisinopril) and start micardis 80  mg one daily  - if too strong break it in half     03/13/2020  Acute ov/Naila Elizondo re: uacs/ spn   / already zpak and tessalon since  02/13/20 no better Chief Complaint  Patient presents with   Acute Visit    C/o cough sticky white and sob x , wheezing occass.  Dyspnea: limited by legs aching (calves) /back pain  s radicular features  Cough: flared p exp to sick fm members, did not test for covid last week  With thick white mucus Sleeping: flat bed, lots of pillows or recliner or can't sleep s cough  SABA use: none  02: none Covid status:   Unvaccinated  With urinary problems since admit [facility]> to see Manny 02/15/20 rec Pantoprazole (protonix) 40 mg   Take  30-60 min before first meal of the day and Pepcid (famotidine)  20 mg one after supper  until return to office - this is the best way to tell whether stomach acid is contributing to your problem.   Stop all fish oil and the lipitor for 2 weeks  - if aches improve, start back on one half of the lipitor  Prednisone 10 mg take  4 each am x 2 days,   2 each am x 2 days,  1 each am x 2 days and stop  For cough >  mucinex dm 1200 mg every 12 hours and supplement if still coughing supplement tramadol 50 mg every 4 hours  AFTER you see Dr Berneice Heinrich  >>> for  drainage / throat tickle try take CHLORPHENIRAMINE  4 mg  (Chlortab 4mg  ) Please schedule a follow up office visit in 2 weeks, sooner if needed  with all medications /inhalers/ solutions in hand so we can verify exactly what you are taking. This includes all medications from all doctors and over the counters    05/20/2020  f/u ov/Leilene Diprima re:  Recurrent cough c/w UACS Chief Complaint  Patient presents with   Follow-up    Breathing has improved since the last visit.    Dyspnea: no limitations  Cough: no cough  Sleeping: in bed /2pillows  SABA use: none  02: no Covid status:   No vaccinated  Rec Stop pantoprazole and just pepcid 20 mg twice daily after bfast and supper x one weeks then just take night time dose x one week and then stop and if cough flares then that would suggest this is a reflux problem and  we can do GI referral or let Renae Fickle manage the problem       11/10/2022  f/u ov/Deshannon Seide re: recurrent cough p lysol exp 3 m prior to OV  assoc with doe    maint on ppi q am  Chief Complaint  Patient presents with   Follow-up    Sx x 3 months.  [place].  Patient sprayed Lysol all throughout house.  Patient breathed in a lot of Lysol.  Now SOB, wheeze and cough  Dyspnea:  walking room to room  Cough: mucus is white clear in am and p supper but not noct/ more throat clearing than true cough  Sleeping: bed is flat / 4 pillows  s resp cc  SABA use: no help  02: none  Multipie ov's dx pna/ uri but no solid evidence of either in images/ notes  Now on bactrim for uti   No obvious day to day or daytime variability or assoc  purulent sputum or mucus plugs or hemoptysis or cp or chest tightness, subjective wheeze or overt   hb symptoms.    Also denies any obvious fluctuation of symptoms with weather or  environmental changes or other aggravating or alleviating factors except as outlined above   No unusual exposure hx or h/o childhood pna/ asthma or knowledge of premature birth.  Current Allergies, Complete Past Medical History, Past Surgical History, Family History, and Social History were reviewed in [employer] record.  ROS  The following are not active complaints unless bolded Hoarseness, sore throat= globus sensation , dysphagia, dental problems, itching, sneezing,  nasal congestion or discharge of excess mucus or purulent secretions, ear ache,   fever, chills, sweats, unintended wt loss or wt gain, classically pleuritic or exertional cp,  orthopnea pnd or arm/hand swelling  or leg swelling, presyncope, palpitations, abdominal pain, anorexia, nausea, vomiting, diarrhea  or change in bowel habits or change in bladder habits, change in stools or change in urine, dysuria, hematuria,  rash, arthralgias, visual complaints, headache, numbness, weakness or ataxia or problems with walking or coordination,  change in mood or  memory. Anosmia "smells like lysol"         Current Meds - - NOTE:   Unable to verify as accurately reflecting what pt takes    Medication Sig   acetaminophen (TYLENOL) 500 MG tablet Take 1,000 mg by mouth every 6 (six) hours as needed for headache (pain).   aspirin EC 81 MG tablet Take 1 tablet (81 mg total) by mouth daily.   atorvastatin (LIPITOR) 40 MG tablet TAKE 1 TABLET BY MOUTH EVERY DAY   clopidogrel (PLAVIX) 75 MG tablet TAKE 1 TABLET BY MOUTH EVERY DAY WITH BREAKFAST. *MUST KEEP UPCOMING APPT*   ezetimibe (ZETIA) 10 MG tablet TAKE 1 TABLET BY MOUTH EVERY DAY   fenofibrate 160 MG tablet TAKE 1 TABLET BY MOUTH EVERY DAY   ipratropium (ATROVENT) 0.03 % nasal spray Place 2 sprays into both nostrils every 12 (twelve) hours.   KRILL OIL PO Take by mouth 2 (two) times daily.   magic mouthwash (nystatin, lidocaine, diphenhydrAMINE, alum & mag hydroxide)  suspension Swish and spit 5 mLs 4 (four) times daily as needed for mouth pain.   nebivolol (BYSTOLIC) 10 MG tablet TAKE 1 TABLET BY MOUTH EVERY DAY   nitroGLYCERIN (NITROSTAT) 0.4 MG SL tablet Place 1 tablet (0.4 mg total) under the tongue every 5 (five) minutes as needed for chest pain.  pantoprazole (PROTONIX) 40 MG tablet TAKE 1 TABLET BY MOUTH EVERY DAY   sulfamethoxazole-trimethoprim (BACTRIM DS) 800-160 MG tablet Take 1 tablet by mouth 2 (two) times daily for 7 days.   tamsulosin (FLOMAX) 0.4 MG CAPS capsule Take 0.4 mg by mouth in the morning and at bedtime.                 Objective:   Physical Exam  Wts  11/10/2022      208  05/20/2020         211 03/29/2020       203 03/13/2020          207 02/22/2018        215  12/30/2016      207  06/19/2016          209  05/21/16 211 lb (95.7 kg)  05/05/16 214 lb (97.1 kg)  04/20/16 212 lb 4 oz (96.3 kg)    Vital signs reviewed  11/10/2022  - Note at rest 02 sats  94% on RA    General appearance:    edlery wm unusual affect - no cough at all during ov        HEENT : Oropharynx  clear s cobblestoning or excess mucus  /edentulous Nasal turbinates nl    NECK :  without  apparent JVD/ palpable Nodes/TM    LUNGS: no acc muscle use,  Nl contour chest which is clear to A and P bilaterally without cough on insp or exp maneuvers   CV:  RRR  no s3 or murmur or increase in P2, and no edema   ABD:  obese soft and nontender   MS:  Nl gait/ ext warm without deformities Or obvious joint restrictions  calf tenderness, cyanosis or clubbing    SKIN: warm and dry without lesions    NEURO:  alert, approp, nl sensorium with  no motor or cerebellar deficits apparent.         I personally reviewed images and agree with radiology impression as follows:  CXR:   pa and lateral 09/22/22  Wnl         Assessment:

## 2022-11-10 NOTE — Assessment & Plan Note (Signed)
Onset early jan 2022 p uri/ covid not excluded at the time -  Chest CTa 03/15/20  1. No evidence of pulmonary embolus. 2. Dependent ground-glass attenuation within the lungs felt to represent hypoventilatory change rather than airspace disease. 3. Stable 19 x 14 mm left lower lobe pulmonary nodule. Given long-term stability, this can be considered benign -  03/29/2020   [facility] RA  2 laps @ approx 280ft each @ moderately fast  pace  stopped due to end of study, min sob and sats 92% at end  - 11/10/2022   Walked on RA  x  3  lap(s) =  approx 750  ft  @ mod to fast pace, stopped due to end of study c/o back and legs fatigued with lowest 02 sats 97%    Unable to reproduce doe across the room and actually now says legs stop him before breathing so no additional pulmonary w/u needed  Each maintenance medication was reviewed in detail including emphasizing most importantly the difference between maintenance and prns and under what circumstances the prns are to be triggered using an action plan format where appropriate.  Total time for H and P, chart review, counseling,  directly observing portions of ambulatory 02 saturation study/ and generating customized AVS unique to this office visit / same day charting > 40 min for multiple  new refractory respiratory  symptoms of uncertain etiology

## 2022-11-10 NOTE — Assessment & Plan Note (Signed)
Developed on ACEi p flu/pna shots 10/2016  - persistent sense of globus reported 02/22/2018 rec trial off acei >  Resolved - recurrent p uri from fm member in early Jan 2022 / not tested for covid until late Feb 2022 neg  - CTa chest 03/15/20 neg - resolved 03/29/2020 on gerd rx/ short course prednisone so rec continue gerd rx  - try taper off gerd rx 05/20/2020 and f/u PCP/GI prn  - flared Sept 2024 p lysol/ mold exp  - Sinus CT 11/10/2022 >>>  - ENT referral   11/10/2022 >>>     Absence during sleep is classic for Upper airway cough syndrome (previously labeled PNDS),  is so named because it's frequently impossible to sort out how much is  CR/sinusitis with freq throat clearing (which can be related to primary GERD)   vs  causing  secondary (" extra esophageal")  GERD from wide swings in gastric pressure that occur with throat clearing, often  promoting self use of mint and menthol lozenges that reduce the lower esophageal sphincter tone and exacerbate the problem further in a cyclical fashion.   These are the same pts (now being labeled as having "irritable larynx syndrome" by some cough centers) who not infrequently have a history of having failed to tolerate ace inhibitors,  dry powder inhalers or biphosphonates or report having atypical/extraesophageal reflux(globus)  symptoms that don't respond to standard doses of PPI  and are easily confused as having aecopd or asthma flares by even experienced allergists/ pulmonologists (myself included).  Rec Max gerd rx  For cough/ congestion > mucinex or mucinex dm  up to maximum of  1200 mg every 12 hours   Schedule sinus ct/ ENT eval - can cancel if this resolves in meantime

## 2022-11-11 ENCOUNTER — Other Ambulatory Visit: Payer: Self-pay | Admitting: Cardiology

## 2022-11-18 ENCOUNTER — Ambulatory Visit (HOSPITAL_COMMUNITY)
Admission: RE | Admit: 2022-11-18 | Discharge: 2022-11-18 | Disposition: A | Discharge status: Home / Self Care | Payer: Medicare Other | Source: Ambulatory Visit | Attending: Internal Medicine | Admitting: Internal Medicine

## 2022-11-18 DIAGNOSIS — I672 Cerebral atherosclerosis: Secondary | ICD-10-CM | POA: Diagnosis not present

## 2022-11-18 DIAGNOSIS — J342 Deviated nasal septum: Secondary | ICD-10-CM | POA: Diagnosis not present

## 2022-11-18 DIAGNOSIS — R058 Other specified cough: Secondary | ICD-10-CM | POA: Insufficient documentation

## 2022-11-18 DIAGNOSIS — R059 Cough, unspecified: Secondary | ICD-10-CM | POA: Diagnosis not present

## 2022-12-04 DIAGNOSIS — N3000 Acute cystitis without hematuria: Secondary | ICD-10-CM | POA: Diagnosis not present

## 2022-12-04 DIAGNOSIS — R3915 Urgency of urination: Secondary | ICD-10-CM | POA: Diagnosis not present

## 2022-12-04 DIAGNOSIS — R350 Frequency of micturition: Secondary | ICD-10-CM | POA: Diagnosis not present

## 2022-12-14 ENCOUNTER — Telehealth: Payer: Self-pay | Admitting: Internal Medicine

## 2022-12-14 NOTE — Telephone Encounter (Signed)
Patient's wife called back to get test results from CT scan. Please call back.

## 2022-12-16 NOTE — Progress Notes (Signed)
Spoke with pt's spouse and notified of results per Dr Sherene Sires  She verbalized understanding and she states the pt already has appt with the ENT for 12/23/22 Nothing further needed

## 2022-12-16 NOTE — Telephone Encounter (Signed)
Called and spoke with Ms.Iannaccone as listed in [place]. Went over Ct results and pt has a upcoming appt on 12/8 with ent, nfn

## 2022-12-20 ENCOUNTER — Other Ambulatory Visit: Payer: Self-pay | Admitting: Cardiology

## 2022-12-23 ENCOUNTER — Institutional Professional Consult (permissible substitution) (INDEPENDENT_AMBULATORY_CARE_PROVIDER_SITE_OTHER): Payer: Medicare Other | Admitting: Otolaryngology

## 2023-01-16 ENCOUNTER — Other Ambulatory Visit: Payer: Self-pay | Admitting: Cardiology

## 2023-01-21 ENCOUNTER — Institutional Professional Consult (permissible substitution) (INDEPENDENT_AMBULATORY_CARE_PROVIDER_SITE_OTHER): Payer: Medicare Other | Admitting: Otolaryngology

## 2023-02-09 ENCOUNTER — Other Ambulatory Visit: Payer: Self-pay | Admitting: Cardiology

## 2023-02-12 ENCOUNTER — Other Ambulatory Visit: Payer: Self-pay | Admitting: Cardiology

## 2023-02-19 ENCOUNTER — Other Ambulatory Visit: Payer: Self-pay | Admitting: Cardiology

## 2023-03-04 ENCOUNTER — Other Ambulatory Visit: Payer: Self-pay | Admitting: Cardiology

## 2023-03-06 ENCOUNTER — Other Ambulatory Visit: Payer: Self-pay | Admitting: Cardiology

## 2023-03-14 ENCOUNTER — Other Ambulatory Visit: Payer: Self-pay | Admitting: Cardiology

## 2023-03-21 ENCOUNTER — Other Ambulatory Visit: Payer: Self-pay | Admitting: Cardiology

## 2023-03-22 MED ORDER — ATORVASTATIN CALCIUM 40 MG PO TABS: 40.0000 mg | ORAL_TABLET | Freq: Every day | ORAL | 0 refills | Status: DC

## 2023-03-22 MED ORDER — PANTOPRAZOLE SODIUM 40 MG PO TBEC: 40.0000 mg | DELAYED_RELEASE_TABLET | Freq: Every day | ORAL | 0 refills | Status: DC

## 2023-03-22 NOTE — Addendum Note (Signed)
 Addended by: Adriana Simas, Ronnae Kaser L on: 03/22/2023 04:25 PM   Modules accepted: Orders

## 2023-04-04 ENCOUNTER — Other Ambulatory Visit: Payer: Self-pay | Admitting: Cardiology

## 2023-04-13 ENCOUNTER — Other Ambulatory Visit: Payer: Self-pay | Admitting: Family Medicine

## 2023-04-13 ENCOUNTER — Telehealth: Payer: Self-pay | Admitting: Cardiology

## 2023-04-13 ENCOUNTER — Other Ambulatory Visit: Payer: Self-pay | Admitting: General Practice

## 2023-04-13 ENCOUNTER — Other Ambulatory Visit: Payer: Self-pay | Admitting: Cardiology

## 2023-04-13 MED ORDER — FENOFIBRATE 160 MG PO TABS: 160.0000 mg | ORAL_TABLET | Freq: Every day | ORAL | 0 refills | Status: DC

## 2023-04-13 MED ORDER — EZETIMIBE 10 MG PO TABS: 10.0000 mg | ORAL_TABLET | Freq: Every day | ORAL | 0 refills | Status: DC

## 2023-04-13 MED ORDER — ATORVASTATIN CALCIUM 40 MG PO TABS: 40.0000 mg | ORAL_TABLET | Freq: Every day | ORAL | 0 refills | Status: DC

## 2023-04-13 MED ORDER — PANTOPRAZOLE SODIUM 40 MG PO TBEC: 40.0000 mg | DELAYED_RELEASE_TABLET | Freq: Every day | ORAL | 0 refills | Status: DC

## 2023-04-13 MED ORDER — CLOPIDOGREL BISULFATE 75 MG PO TABS: ORAL_TABLET | ORAL | 0 refills | Status: DC

## 2023-04-13 MED ORDER — NEBIVOLOL HCL 10 MG PO TABS: 10.0000 mg | ORAL_TABLET | Freq: Every day | ORAL | 0 refills | Status: DC

## 2023-04-13 NOTE — Telephone Encounter (Signed)
*  STAT* If patient is at the pharmacy, call can be transferred to refill team.   1. Which medications need to be refilled? (please list name of each medication and dose if known)  Nebivolol, Atorvastatin, Fenofibrate, Ezetimibe and Clopidogrel   2. Would you like to learn more about the convenience, safety, & potential cost savings by using the [facility]?     3. Are you open to using the [facility] (Type [facility].   4. Which pharmacy/location (including street and city if local pharmacy) is medication to be sent to?[facility] [address] [place]   5. Do they need a 30 day or 90 day supply? 30 days until his appointment

## 2023-04-13 NOTE — Telephone Encounter (Signed)
 Spoke patient's wife she stated husband has been sob for the past several months.Stated he is weak.Hard to walk.Appointment scheduled with Bernadene Person NP 4/4 at 2:45 pm.Advised to bring a list of all medications.Advised I will send in medication refills to his pharmacy.

## 2023-04-13 NOTE — Telephone Encounter (Signed)
 New Message:     Wife says patient needs to be seen. She says he is having a hard time walking and getting yo and down.She also says he stay tired all the time, with no energy.

## 2023-04-16 ENCOUNTER — Ambulatory Visit: Attending: Nurse Practitioner | Admitting: Nurse Practitioner

## 2023-04-16 ENCOUNTER — Encounter: Payer: Self-pay | Admitting: Nurse Practitioner

## 2023-04-16 VITALS — BP 134/72 | HR 65 | Ht 72.0 in | Wt 211.0 lb

## 2023-04-16 DIAGNOSIS — Z8673 Personal history of transient ischemic attack (TIA), and cerebral infarction without residual deficits: Secondary | ICD-10-CM | POA: Diagnosis not present

## 2023-04-16 DIAGNOSIS — R0602 Shortness of breath: Secondary | ICD-10-CM

## 2023-04-16 DIAGNOSIS — T466X5D Adverse effect of antihyperlipidemic and antiarteriosclerotic drugs, subsequent encounter: Secondary | ICD-10-CM

## 2023-04-16 DIAGNOSIS — E785 Hyperlipidemia, unspecified: Secondary | ICD-10-CM | POA: Diagnosis not present

## 2023-04-16 DIAGNOSIS — I2510 Atherosclerotic heart disease of native coronary artery without angina pectoris: Secondary | ICD-10-CM | POA: Diagnosis not present

## 2023-04-16 DIAGNOSIS — M791 Myalgia, unspecified site: Secondary | ICD-10-CM | POA: Diagnosis not present

## 2023-04-16 DIAGNOSIS — I10 Essential (primary) hypertension: Secondary | ICD-10-CM

## 2023-04-16 DIAGNOSIS — T466X5A Adverse effect of antihyperlipidemic and antiarteriosclerotic drugs, initial encounter: Secondary | ICD-10-CM

## 2023-04-16 NOTE — Patient Instructions (Addendum)
 Medication Instructions:  NO CHANGES  Lab Work: FASTING LIPID PANEL AND CMET TO BE DONE WHEN FASTING   Testing/Procedures: Your physician has requested that you have an echocardiogram. Echocardiography is a painless test that uses sound waves to create images of your heart. It provides your doctor with information about the size and shape of your heart and how well your heart's chambers and valves are working. This procedure takes approximately one hour. There are no restrictions for this procedure. Please do NOT wear cologne, perfume, aftershave, or lotions (deodorant is allowed). Please arrive 15 minutes prior to your appointment time.  Please note: We ask at that you not bring children with you during ultrasound (echo/ vascular) testing. Due to room size and safety concerns, children are not allowed in the ultrasound rooms during exams. Our front office staff cannot provide observation of children in our lobby area while testing is being conducted. An adult accompanying a patient to their appointment will only be allowed in the ultrasound room at the discretion of the ultrasound technician under special circumstances. We apologize for any inconvenience.   Follow-Up: At [facility], you and your health needs are our priority.  As part of our continuing mission to provide you with exceptional heart care, our providers are all part of one team.  This team includes your primary Cardiologist (physician) and Advanced Practice Providers or APPs (Physician Assistants and Nurse Practitioners) who all work together to provide you with the care you need, when you need it.  Your next appointment:   2-3 MONTHS  Provider:   [name] Swaz[place], MD      Other Instructions: REFERRAL TO PHARM-D HAS BEEN SENT FOR LIPIDS. THEY WILL CALL YOU TO SCHEDULE APPOINTMENT.      1st Floor: - Lobby - Registration  - Pharmacy  - Lab - Cafe  2nd Floor: - PV Lab - Diagnostic Testing (echo, CT, nuclear  med)  3rd Floor: - Vacant  4th Floor: - TCTS (cardiothoracic surgery) - AFib Clinic - Structural Heart Clinic - Vascular Surgery  - Vascular Ultrasound  5th Floor: - [facility] (general and EP) - Clinical Pharmacy for coumadin, hypertension, lipid, weight-loss medications, and med management appointments    Valet parking services will be available as well.

## 2023-04-16 NOTE — Progress Notes (Signed)
 Office Visit    Patient Name: Colin Lopez Date of Encounter: 04/16/2023  Primary Care Provider:  Eden Emms, NP Primary Cardiologist:  [name] Swaziland, MD  Chief Complaint    83 year old male with a history of CAD s/p DES-LCx in 2009, DES-OM1, DES-distal LCx, DES-mid RCA in 2018, hypertension, hyperlipidemia, and CVA who presents for follow-up related to CAD.   Past Medical History    Past Medical History:  Diagnosis Date   CAD (coronary artery disease), native coronary artery    09/09/16 PCI/DES x3 to mRCA, OM1 and dLcx/OM2.    Coronary disease    Status post stenting of the left circumflex coronary in 2009 with a bare-metal stent (with a 3.5x78mm Liberte stent)   Dyslipidemia    Dyspnea on exertion    Exertional chest pain    Exposure to TB    Hyperlipidemia    Hypertension    Hypertriglyceridemia    NSTEMI (non-ST elevated myocardial infarction) (HCC) 2009   BMS CFX   Numbness and tingling of left side of face    with left lip/face droop (Concern for Bell's Palsy)   Rash and nonspecific skin eruption    Lower right anterior leg   Stroke (HCC)    TIA (transient ischemic attack)    history of tia   Urinary tract infection    Past Surgical History:  Procedure Laterality Date   CARDIOVASCULAR STRESS TEST  10-03-08   EF 59%   CORONARY ANGIOPLASTY WITH STENT PLACEMENT  09/09/2016   CORONARY STENT INTERVENTION N/A 09/09/2016   Procedure: CORONARY STENT INTERVENTION;  Surgeon: Swaziland, [name] M, MD;  Location: [facility] INVASIVE CV LAB;  Service: Cardiovascular;  Laterality: N/A;   LEFT HEART CATH AND CORONARY ANGIOGRAPHY N/A 09/09/2016   Procedure: LEFT HEART CATH AND CORONARY ANGIOGRAPHY;  Surgeon: Swaziland, [name] M, MD;  Location: [facility] INVASIVE CV LAB;  Service: Cardiovascular;  Laterality: N/A;   US ECHOCARDIOGRAPHY  09-21-08   EF 55-60%    Allergies  Allergies  Allergen Reactions   Penicillins Hives    Has patient had a PCN reaction causing immediate rash,  facial/tongue/throat swelling, SOB or lightheadedness with hypotension: No Has patient had a PCN reaction causing severe rash involving mucus membranes or skin necrosis: Yes Has patient had a PCN reaction that required hospitalization: No Has patient had a PCN reaction occurring within the last 10 years: No If all of the above answers are "NO", then may proceed with Cephalosporin use. Ceftriaxone Ok   Doxycycline Rash    Water blisters   Levaquin [Levofloxacin In D5w] Rash     Labs/Other Studies Reviewed    The following studies were reviewed today:  Cardiac Studies & Procedures   ______________________________________________________________________________________________ CARDIAC CATHETERIZATION  CARDIAC CATHETERIZATION 09/09/2016  Narrative  Mid LAD to Dist LAD lesion, 30 %stenosed.  Prox Cx lesion, 25 %stenosed.  Mid RCA lesion, 95 %stenosed.  A STENT SIERRA 3.50 X 23 MM drug eluting stent was successfully placed.  Post intervention, there is a 0% residual stenosis.  Mid Cx lesion, 70 %stenosed.  A STENT SIERRA 2.75 X 12 MM drug eluting stent was successfully placed.  Post intervention, there is a 0% residual stenosis.  Ost 1st Mrg lesion, 90 %stenosed.  A STENT SIERRA 3.00 X 12 MM drug eluting stent was successfully placed.  Post intervention, there is a 0% residual stenosis.  1. Severe 2 vessel obstructive CAD - 90% ostial OM1 - 70% distal LCx/OM2 - 95% mid RCA 2. Successful stenting  of the mid RCA with DES 3. Successful stenting of the ostium of OM1 with DES 4. Successful stenting of the distal LCx/OM2  Plan: DAPT for at least one year. Anticipate DC in am.  Findings Coronary Findings Diagnostic  Dominance: Right  Left Anterior Descending The lesion is segmental.  Left Circumflex The lesion was previously treated using a bare metal stent over 2 years ago.  First Obtuse Marginal Branch Vessel is large in size.  Right Coronary Artery Vessel is  large.  Intervention  Mid Cx lesion Angioplasty Lesion crossed with guidewire using a WIRE ASAHI PROWATER 180CM. Pre-stent angioplasty was performed using a BALLOON SAPPHIRE 2.5X12. A STENT SIERRA 2.75 X 12 MM drug eluting stent was successfully placed. Stent strut is well apposed. Post-stent angioplasty was performed using a BALLOON [place] EMERGE MR 3.0X8. Maximum pressure: 16 atm. The pre-interventional distal flow is normal (TIMI 3).  The post-interventional distal flow is normal (TIMI 3). The intervention was successful . No complications occurred at this lesion. There is a 0% residual stenosis post intervention.  Ost 1st Mrg lesion Angioplasty Lesion crossed with guidewire using a BALLOON SAPPHIRE 2.5X12. A STENT SIERRA 3.00 X 12 MM drug eluting stent was successfully placed. Stent strut is well apposed. Post-stent angioplasty was performed using a BALLOON SAPPHIRE [place] 3.5X8. Maximum pressure: 16 atm. The pre-interventional distal flow is normal (TIMI 3).  The post-interventional distal flow is normal (TIMI 3). The intervention was successful . No complications occurred at this lesion. The OM 1 lesion was at the ostium. It had previously been stented across with BMS. The lesion was wired through the stent struts. The lesion was stented at the ostium ( T stent ). After it was postdilated there was some plaque shift into the LCx distal to the lesion but within the old stent. This was dilated with a 3.25 mm balloon with good result. There is a 0% residual stenosis post intervention.  Mid RCA lesion Angioplasty Lesion crossed with guidewire using a WIRE ASAHI PROWATER 180CM. Pre-stent angioplasty was performed using a BALLOON SAPPHIRE 2.5X12. Maximum pressure: 8 atm. A STENT SIERRA 3.50 X 23 MM drug eluting stent was successfully placed. Stent strut is well apposed. Post-stent angioplasty was performed using a BALLOON [place] EMERGE MR 4.0X15. Maximum pressure: 16 atm. The pre-interventional distal flow is  normal (TIMI 3).  The post-interventional distal flow is normal (TIMI 3). The intervention was successful . No complications occurred at this lesion. Unable to engage the RCA with a LA guide. HS guide engaged well but was inadequate to support delivery of stent to lesion. Theron Arista used for additional support with good results. There is a 0% residual stenosis post intervention.   STRESS TESTS  MYOCARDIAL PERFUSION IMAGING 12/25/2021  Narrative   The study is normal. The study is low risk.   No ST deviation was noted.   LV perfusion is normal. There is no evidence of ischemia. There is no evidence of infarction.   Left ventricular function is normal. Nuclear stress EF: 54 %. The left ventricular ejection fraction is mildly decreased (45-54%). End diastolic cavity size is normal. End systolic cavity size is normal.   Prior study available for comparison from 09/03/2016.   ECHOCARDIOGRAM  ECHOCARDIOGRAM COMPLETE 12/25/2021  Narrative ECHOCARDIOGRAM REPORT    Patient Name:   Colin Lopez Mill Date of Exam: 12/25/2021 Medical Rec #:  [id number]      Height:       72.0 in Accession #:    [id number]  Weight:       209.4 lb Date of Birth:  1940-05-11      BSA:          2.173 m Patient Age:    81 years       BP:           138/68 mmHg Patient Gender: M              HR:           62 bpm. Exam Location:  [address]  Procedure: 2D Echo, Cardiac Doppler, Color Doppler and 3D Echo  Indications:    DOE R06.09  History:        Patient has prior history of Echocardiogram examinations, most recent 09/16/2016. CAD and Previous Myocardial Infarction; Risk Factors:Hypertension and Dyslipidemia.  Sonographer:    Thurman Coyer RDCS Referring Phys: [id number] ELIZABETH PECK  IMPRESSIONS   1. Left ventricular ejection fraction, by estimation, is 60 to 65%. Left ventricular ejection fraction by 3D volume is 59 %. The left ventricle has normal function. The left ventricle has no regional wall  motion abnormalities. Left ventricular diastolic parameters are consistent with Grade I diastolic dysfunction (impaired relaxation). 2. Right ventricular systolic function is normal. The right ventricular size is normal. There is normal pulmonary artery systolic pressure. The estimated right ventricular systolic pressure is 20.3 mmHg. 3. The mitral valve is degenerative. Trivial mitral valve regurgitation. No evidence of mitral stenosis. Moderate mitral annular calcification. 4. The aortic valve is grossly normal. There is mild calcification of the aortic valve. Aortic valve regurgitation is not visualized. No aortic stenosis is present. 5. There is borderline dilatation of the ascending aorta, measuring 42 mm. 6. The inferior vena cava is normal in size with greater than 50% respiratory variability, suggesting right atrial pressure of 3 mmHg.  FINDINGS Left Ventricle: Left ventricular ejection fraction, by estimation, is 60 to 65%. Left ventricular ejection fraction by 3D volume is 59 %. The left ventricle has normal function. The left ventricle has no regional wall motion abnormalities. The left ventricular internal cavity size was normal in size. There is no left ventricular hypertrophy. Left ventricular diastolic parameters are consistent with Grade I diastolic dysfunction (impaired relaxation).  Right Ventricle: The right ventricular size is normal. No increase in right ventricular wall thickness. Right ventricular systolic function is normal. There is normal pulmonary artery systolic pressure. The tricuspid regurgitant velocity is 2.08 m/s, and with an assumed right atrial pressure of 3 mmHg, the estimated right ventricular systolic pressure is 20.3 mmHg.  Left Atrium: Left atrial size was normal in size.  Right Atrium: Right atrial size was normal in size.  Pericardium: There is no evidence of pericardial effusion.  Mitral Valve: The mitral valve is degenerative in appearance. Moderate  mitral annular calcification. Trivial mitral valve regurgitation. No evidence of mitral valve stenosis.  Tricuspid Valve: The tricuspid valve is normal in structure. Tricuspid valve regurgitation is trivial. No evidence of tricuspid stenosis.  Aortic Valve: The aortic valve is grossly normal. There is mild calcification of the aortic valve. Aortic valve regurgitation is not visualized. No aortic stenosis is present.  Pulmonic Valve: The pulmonic valve was normal in structure. Pulmonic valve regurgitation is trivial. No evidence of pulmonic stenosis.  Aorta: The aortic root is normal in size and structure. There is borderline dilatation of the ascending aorta, measuring 42 mm.  Venous: The inferior vena cava is normal in size with greater than 50% respiratory variability, suggesting right atrial pressure of  3 mmHg.  IAS/Shunts: No atrial level shunt detected by color flow Doppler.   LEFT VENTRICLE PLAX 2D LVIDd:         4.90 cm         Diastology LVIDs:         3.50 cm         LV e' medial:    5.78 cm/s LV PW:         1.40 cm         LV E/e' medial:  8.2 LV IVS:        1.40 cm         LV e' lateral:   9.73 cm/s LVOT diam:     2.20 cm         LV E/e' lateral: 4.9 LV SV:         60 LV SV Index:   28 LVOT Area:     3.80 cm        3D Volume EF LV 3D EF:    Left ventricul ar ejection fraction by 3D volume is 59 %.  3D Volume EF: 3D EF:        59 % LV EDV:       101 ml LV ESV:       42 ml LV SV:        60 ml  RIGHT VENTRICLE RV Basal diam:  4.10 cm RV Mid diam:    3.00 cm RV S prime:     9.37 cm/s TAPSE (M-mode): 1.8 cm  LEFT ATRIUM             Index        RIGHT ATRIUM           Index LA diam:        4.70 cm 2.16 cm/m   RA Area:     19.10 cm LA Vol (A2C):   43.2 ml 19.88 ml/m  RA Volume:   63.10 ml  29.04 ml/m LA Vol (A4C):   33.6 ml 15.47 ml/m LA Biplane Vol: 41.7 ml 19.19 ml/m AORTIC VALVE LVOT Vmax:   66.90 cm/s LVOT Vmean:  43.400 cm/s LVOT VTI:    0.158  m  AORTA Ao Root diam: 3.40 cm Ao Asc diam:  4.20 cm  MITRAL VALVE               TRICUSPID VALVE MV Area (PHT): 2.66 cm    TR Peak grad:   17.3 mmHg MV Decel Time: 285 msec    TR Vmax:        208.00 cm/s MV E velocity: 47.40 cm/s MV A velocity: 62.90 cm/s  SHUNTS MV E/A ratio:  0.75        Systemic VTI:  0.16 m Systemic Diam: 2.20 cm  Weston Brass MD Electronically signed by Weston Brass MD Signature Date/Time: 12/25/2021/3:35:41 PM    Final          ______________________________________________________________________________________________     Recent Labs: 09/22/2022: ALT 15; BUN 26; Creatinine, Ser 1.33; Hemoglobin 13.4; Platelets 134.0; Potassium 4.4; Pro B Natriuretic peptide (BNP) 55.0; Sodium 139; TSH 3.25  Recent Lipid Panel    Component Value Date/Time   CHOL 152 04/14/2021 1626   TRIG 182 (H) 04/14/2021 1626   HDL 30 (L) 04/14/2021 1626   CHOLHDL 5.1 (H) 04/14/2021 1626   CHOLHDL 5 11/13/2016 0838   VLDL 39.0 11/13/2016 0838   LDLCALC 90 04/14/2021 1626   LDLDIRECT 74.0 12/17/2014 1505  History of Present Illness    83 year old male with the above past medical history including CAD s/p DES-LCx in 2009, DES-OM1, DES-distal LCx, DES-mid RCA in 2018, hypertension, hyperlipidemia, and CVA.  He was hospitalized in 2009 in the setting of NSTEMI.  He underwent stenting of the left circumflex artery.  Myoview in 2018 showed multiple perfusion defects, EF 37%.  Follow-up cardiac catheterization in August 2018 showed three-vessel disease.  He underwent successful stenting x 3 to the OM1, distal LCx, mid RCA.  He was hospitalized in September 2018 in the setting of CVA.  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  CT angiogram of the head and neck showed both ICA widely patent, atherosclerotic disease of the supraclinoid internal carotid  arteries, no intracranial branch vessel disease.  Echocardiogram at the time showed EF 55 to 60%, G1 DD, no RWMA.  He was hospitalized in May 2021 in setting of AKI/UTI.  Most recent echocardiogram in 12/2022 in the setting of dyspnea on exertion showed EF 60 to 65%, normal LV function, no RWMA, G1 DD, normal RV systolic function, moderate MAC, mild calcification of the aortic valve, no evidence of aortic stenosis.  Lexiscan Myoview in 12/2021 was low risk, no evidence of ischemia or infarction, EF 54%.  He was last seen in the office on 12/30/2021 and was stable from a cardiac standpoint.  He denied symptoms concerning for angina.  He presents today for follow-up accompanied by his wife. Since his last visit he has been stable overall from a cardiac standpoint.  He does note some ongoing dyspnea on exertion, which has increased over the past year. He reports rare fleeting chest discomfort which he describes as a "shock wave" that last for seconds at a time and resolve spontaneously.  He has not taken nitroglycerin for his symptoms. He has stable dependent, nonpitting bilateral lower extremity edema. He denies any PND, orthopnea, weight gain  He also reports significant bilateral leg/joint pain in his knees. His symptoms are similar to those he experienced when taking Crestor, but worse.  This is his biggest concern today.  Home Medications    Current Outpatient Medications  Medication Sig Dispense Refill   acetaminophen (TYLENOL) 500 MG tablet Take 1,000 mg by mouth every 6 (six) hours as needed for headache (pain).     aspirin EC 81 MG tablet Take 1 tablet (81 mg total) by mouth daily. 90 tablet 3   atorvastatin (LIPITOR) 40 MG tablet Take 1 tablet (40 mg total) by mouth daily. 90 tablet 0   clopidogrel (PLAVIX) 75 MG tablet TAKE 1 TABLET BY MOUTH EVERY DAY WITH BREAKFAST. 90 tablet 0   ezetimibe (ZETIA) 10 MG tablet Take 1 tablet (10 mg total) by mouth daily. 90 tablet 0   fenofibrate 160 MG tablet  Take 1 tablet (160 mg total) by mouth daily. 90 tablet 0   nebivolol (BYSTOLIC) 10 MG tablet Take 1 tablet (10 mg total) by mouth daily. 90 tablet 0   nitroGLYCERIN (NITROSTAT) 0.4 MG SL tablet Place 1 tablet (0.4 mg total) under the tongue every 5 (five) minutes as needed for chest pain. 25 tablet 3   pantoprazole (PROTONIX) 40 MG tablet Take 1 tablet (40 mg total) by mouth daily. 90 tablet 0   tamsulosin (FLOMAX) 0.4 MG CAPS capsule Take 0.4 mg by mouth in the morning and at bedtime.     albuterol (VENTOLIN  HFA) 108 (90 Base) MCG/ACT inhaler Inhale 2 puffs into the lungs every 6 (six) hours as needed for wheezing or shortness of breath. (Patient not taking: Reported on 04/16/2023) 8 g 2   ipratropium (ATROVENT) 0.03 % nasal spray Place 2 sprays into both nostrils every 12 (twelve) hours. (Patient not taking: Reported on 04/16/2023) 30 mL 0   magic mouthwash (nystatin, lidocaine, diphenhydrAMINE, alum & mag hydroxide) suspension Swish and spit 5 mLs 4 (four) times daily as needed for mouth pain. (Patient not taking: Reported on 04/16/2023) 180 mL 1   promethazine-dextromethorphan (PROMETHAZINE-DM) 6.25-15 MG/5ML syrup Take 5 mLs by mouth at bedtime as needed for cough. (Patient not taking: Reported on 04/16/2023) 118 mL 0   No current facility-administered medications for this visit.     Review of Systems    He denies palpitations, pnd, orthopnea, n, v, dizziness, syncope, weight gain, or early satiety. All other systems reviewed and are otherwise negative except as noted above.   Physical Exam    VS:  BP 134/72 (BP Location: Right Arm, Patient Position: Sitting, Cuff Size: Normal)   Pulse 65   Ht 6' (1.829 m)   Wt 211 lb (95.7 kg)   SpO2 91%   BMI 28.62 kg/m  GEN: Well nourished, well developed, in no acute distress. HEENT: normal. Neck: Supple, no JVD, carotid bruits, or masses. Cardiac: RRR, no murmurs, rubs, or gallops. No clubbing, cyanosis, nonpitting bilateral lower extremity edema.   Radials/DP/PT 2+ and equal bilaterally.  Respiratory:  Respirations regular and unlabored, clear to auscultation bilaterally. GI: Soft, nontender, nondistended, BS + x 4. MS: no deformity or atrophy. Skin: warm and dry, no rash. Neuro:  Strength and sensation are intact. Psych: Normal affect.  Accessory Clinical Findings    ECG personally reviewed by me today - EKG Interpretation Date/Time:  Friday April 16 2023 14:47:52 EDT Ventricular Rate:  65 PR Interval:  188 QRS Duration:  106 QT Interval:  406 QTC Calculation: 422 R Axis:   -6  Text Interpretation: Normal sinus rhythm Moderate voltage criteria for LVH, may be normal variant ( R in aVL , Cornell product ) When compared with ECG of 15-Aug-2022 16:42, No significant change was found Confirmed by Bernadene Person ([id number]) on 04/16/2023 3:03:14 PM  - no acute changes.   Lab Results  Component Value Date   WBC 7.3 09/22/2022   HGB 13.4 09/22/2022   HCT 41.0 09/22/2022   MCV 93.0 09/22/2022   PLT 134.0 (L) 09/22/2022   Lab Results  Component Value Date   CREATININE 1.33 09/22/2022   BUN 26 (H) 09/22/2022   NA 139 09/22/2022   K 4.4 09/22/2022   CL 106 09/22/2022   CO2 26 09/22/2022   Lab Results  Component Value Date   ALT 15 09/22/2022   AST 19 09/22/2022   ALKPHOS 41 09/22/2022   BILITOT 0.6 09/22/2022   Lab Results  Component Value Date   CHOL 152 04/14/2021   HDL 30 (L) 04/14/2021   LDLCALC 90 04/14/2021   LDLDIRECT 74.0 12/17/2014   TRIG 182 (H) 04/14/2021   CHOLHDL 5.1 (H) 04/14/2021    Lab Results  Component Value Date   HGBA1C 5.1 10/12/2016    Assessment & Plan   1. CAD/dyspnea on exertion:  S/p DES-LCx in 2009, DES-OM1, DES-distal LCx, DES-mid RCA in 2018. Reports ongoing dyspnea on exertion, which has increased over the past year. He reports rare fleeting chest discomfort which he describes as a "shock wave" that last  for seconds at a time and resolves spontaneously.  He has not taken nitroglycerin for  his symptoms.   We discussed possible stress test, however he declines at this point in time, also discussed antianginal therapy, he declines. He has stable dependent, nonpitting bilateral lower extremity edema, generally euvolemic and well compensated. Will update echo given ongoing shortness of breath. Reviewed ED precautions.  Continue aspirin, Plavix,Nebivolol, Lipitor, fenofibrate, Zetia.    2. Hypertension: BP well controlled. Continue current antihypertensive regimen.   3. Hyperlipidemia/statin myalgia: LDL was 90 in 04/2021. He reports significant bilateral leg/joint pain in his knees. This is his biggest concern today.  His symptoms are similar to those he experienced when taking Crestor, but worse. We discussed statin holiday, will refer to lipid clinic Pharm.D. for consideration of alternative lipid-lowering therapy as has a history of statin intolerance.  Will check fasting lipids, CMET.  4. History of CVA: No recurrence. Continue ASA, Plavix, Lipitor.   5. Disposition: Follow-up in 2 to 3 months with Dr. Swaziland.      Joylene Grapes, NP 04/16/2023, 3:21 PM

## 2023-04-18 ENCOUNTER — Encounter: Payer: Self-pay | Admitting: Nurse Practitioner

## 2023-04-22 DIAGNOSIS — E785 Hyperlipidemia, unspecified: Secondary | ICD-10-CM | POA: Diagnosis not present

## 2023-04-22 DIAGNOSIS — I2510 Atherosclerotic heart disease of native coronary artery without angina pectoris: Secondary | ICD-10-CM | POA: Diagnosis not present

## 2023-04-22 DIAGNOSIS — I10 Essential (primary) hypertension: Secondary | ICD-10-CM | POA: Diagnosis not present

## 2023-04-22 DIAGNOSIS — R0602 Shortness of breath: Secondary | ICD-10-CM | POA: Diagnosis not present

## 2023-04-22 DIAGNOSIS — Z8673 Personal history of transient ischemic attack (TIA), and cerebral infarction without residual deficits: Secondary | ICD-10-CM | POA: Diagnosis not present

## 2023-04-22 LAB — LIPID PANEL
Chol/HDL Ratio: 5.4 ratio — ABNORMAL HIGH (ref 0.0–5.0)
Cholesterol, Total: 151 mg/dL (ref 100–199)
HDL: 28 mg/dL — ABNORMAL LOW (ref >39)
LDL Chol Calc (NIH): 97 mg/dL (ref 0–99)
Triglycerides: 143 mg/dL (ref 0–149)
VLDL Cholesterol Cal: 26 mg/dL (ref 5–40)

## 2023-04-22 LAB — COMPREHENSIVE METABOLIC PANEL WITH GFR
ALT: 17 IU/L (ref 0–44)
AST: 27 IU/L (ref 0–40)
Albumin: 4.1 g/dL (ref 3.7–4.7)
Alkaline Phosphatase: 54 IU/L (ref 44–121)
BUN/Creatinine Ratio: 19 (ref 10–24)
BUN: 25 mg/dL (ref 8–27)
Bilirubin Total: 0.6 mg/dL (ref 0.0–1.2)
CO2: 22 mmol/L (ref 20–29)
Calcium: 8.9 mg/dL (ref 8.6–10.2)
Chloride: 103 mmol/L (ref 96–106)
Creatinine, Ser: 1.32 mg/dL — ABNORMAL HIGH (ref 0.76–1.27)
Globulin, Total: 1.9 g/dL (ref 1.5–4.5)
Glucose: 105 mg/dL — ABNORMAL HIGH (ref 70–99)
Potassium: 5.1 mmol/L (ref 3.5–5.2)
Sodium: 139 mmol/L (ref 134–144)
Total Protein: 6 g/dL (ref 6.0–8.5)
eGFR: 54 mL/min/{1.73_m2} — ABNORMAL LOW (ref >59)

## 2023-05-04 ENCOUNTER — Encounter: Payer: Self-pay | Admitting: Pharmacist

## 2023-05-04 ENCOUNTER — Telehealth: Payer: Self-pay | Admitting: Pharmacist

## 2023-05-04 ENCOUNTER — Ambulatory Visit: Attending: Cardiology | Admitting: Pharmacist

## 2023-05-04 DIAGNOSIS — M791 Myalgia, unspecified site: Secondary | ICD-10-CM

## 2023-05-04 DIAGNOSIS — E785 Hyperlipidemia, unspecified: Secondary | ICD-10-CM | POA: Diagnosis not present

## 2023-05-04 DIAGNOSIS — T466X5D Adverse effect of antihyperlipidemic and antiarteriosclerotic drugs, subsequent encounter: Secondary | ICD-10-CM | POA: Diagnosis not present

## 2023-05-04 DIAGNOSIS — Z8673 Personal history of transient ischemic attack (TIA), and cerebral infarction without residual deficits: Secondary | ICD-10-CM

## 2023-05-04 NOTE — Telephone Encounter (Signed)
 Please complete PA for Repatha

## 2023-05-04 NOTE — Patient Instructions (Addendum)
 It was nice meeting you two today  We would like your LDL (bad cholesterol) to be less than 55  The  medication we discussed today is called Repatha , which is an injection you would take once every 2 weeks  I will complete the prior authorization for you and contact you when it is approved  Once you start the medication we would recheck your fasting lipid panel in about 3 months  Please let us  know if you have any questions  Joelene Murrain, PharmD, BCACP, CDCES, CPP [address] [place], [place], [address] Phone: [phone], Fax: [phone]      1st Floor: - Lobby - Registration  - Pharmacy  - Lab - Cafe  2nd Floor: - PV Lab - Diagnostic Testing (echo, CT, nuclear med)  3rd Floor: - Vacant  4th Floor: - TCTS (cardiothoracic surgery) - AFib Clinic - Structural Heart Clinic - Vascular Surgery  - Vascular Ultrasound  5th Floor: - [facility] (general and EP) - Clinical Pharmacy for coumadin, hypertension, lipid, weight-loss medications, and med management appointments    Valet parking services will be available as well.

## 2023-05-04 NOTE — Progress Notes (Signed)
 Patient ID: Colin Lopez                 DOB: 04-19-1940                    MRN: [id number]     HPI: Colin Lopez is a 83 y.o. male patient referred to lipid clinic by Colin Lopez. Patient of Dr Colin Lopez. PMH is significant for CAD, CVA, HTN, and HLD.   Patient currently holding his atorvastatin  40mg  due to leg pain. Leg pain has unfortunately not decreased.   Past diagnostic CT: Mid LAD to Dist LAD lesion, 30 %stenosed. Prox Cx lesion, 25 %stenosed. Mid RCA lesion, 95 %stenosed. A STENT SIERRA 3.50 X 23 MM drug eluting stent was successfully placed. Post intervention, there is a 0% residual stenosis. Mid Cx lesion, 70 %stenosed. A STENT SIERRA 2.75 X 12 MM drug eluting stent was successfully placed. Post intervention, there is a 0% residual stenosis. Ost 1st Mrg lesion, 90 %stenosed. A STENT SIERRA 3.00 X 12 MM drug eluting stent was successfully placed. Post intervention, there is a 0% residual stenosis.  Current Medications: Atorvastatin  40mg  daily (currently holding)  Intolerances: N/A  Risk Factors:  History of CVA CAD  LDL goal: <55  Labs: TC 151, Trigs 143, HDL 28, LDL 97 (04/22/23)  Past Medical History:  Diagnosis Date   CAD (coronary artery disease), native coronary artery    09/09/16 PCI/DES x3 to mRCA, OM1 and dLcx/OM2.    Coronary disease    Status post stenting of the left circumflex coronary in 2009 with a bare-metal stent (with a 3.5x67mm Liberte stent)   Dyslipidemia    Dyspnea on exertion    Exertional chest pain    Exposure to TB    Hyperlipidemia    Hypertension    Hypertriglyceridemia    NSTEMI (non-ST elevated myocardial infarction) (HCC) 2009   BMS CFX   Numbness and tingling of left side of face    with left lip/face droop (Concern for Bell's Palsy)   Rash and nonspecific skin eruption    Lower right anterior leg   Stroke (HCC)    TIA (transient ischemic attack)    history of tia   Urinary tract infection     Current Outpatient  Medications on File Prior to Visit  Medication Sig Dispense Refill   acetaminophen  (TYLENOL ) 500 MG tablet Take 1,000 mg by mouth every 6 (six) hours as needed for headache (pain).     albuterol  (VENTOLIN  HFA) 108 (90 Base) MCG/ACT inhaler Inhale 2 puffs into the lungs every 6 (six) hours as needed for wheezing or shortness of breath. (Patient not taking: Reported on 04/16/2023) 8 g 2   aspirin  EC 81 MG tablet Take 1 tablet (81 mg total) by mouth daily. 90 tablet 3   atorvastatin  (LIPITOR ) 40 MG tablet Take 1 tablet (40 mg total) by mouth daily. 90 tablet 0   clopidogrel  (PLAVIX ) 75 MG tablet TAKE 1 TABLET BY MOUTH EVERY DAY WITH BREAKFAST. 90 tablet 0   ezetimibe  (ZETIA ) 10 MG tablet Take 1 tablet (10 mg total) by mouth daily. 90 tablet 0   fenofibrate  160 MG tablet Take 1 tablet (160 mg total) by mouth daily. 90 tablet 0   ipratropium (ATROVENT ) 0.03 % nasal spray Place 2 sprays into both nostrils every 12 (twelve) hours. (Patient not taking: Reported on 04/16/2023) 30 mL 0   magic mouthwash (nystatin , lidocaine , diphenhydrAMINE, alum & mag hydroxide) suspension Swish and spit 5  mLs 4 (four) times daily as needed for mouth pain. (Patient not taking: Reported on 04/16/2023) 180 mL 1   nebivolol  (BYSTOLIC ) 10 MG tablet Take 1 tablet (10 mg total) by mouth daily. 90 tablet 0   nitroGLYCERIN  (NITROSTAT ) 0.4 MG SL tablet Place 1 tablet (0.4 mg total) under the tongue every 5 (five) minutes as needed for chest pain. 25 tablet 3   pantoprazole  (PROTONIX ) 40 MG tablet Take 1 tablet (40 mg total) by mouth daily. 90 tablet 0   promethazine -dextromethorphan (PROMETHAZINE -DM) 6.25-15 MG/5ML syrup Take 5 mLs by mouth at bedtime as needed for cough. (Patient not taking: Reported on 04/16/2023) 118 mL 0   tamsulosin  (FLOMAX ) 0.4 MG CAPS capsule Take 0.4 mg by mouth in the morning and at bedtime.     No current facility-administered medications on file prior to visit.    Allergies  Allergen Reactions   Penicillins  Hives    Has patient had a PCN reaction causing immediate rash, facial/tongue/throat swelling, SOB or lightheadedness with hypotension: No Has patient had a PCN reaction causing severe rash involving mucus membranes or skin necrosis: Yes Has patient had a PCN reaction that required hospitalization: No Has patient had a PCN reaction occurring within the last 10 years: No If all of the above answers are "NO", then may proceed with Cephalosporin use. Ceftriaxone  Ok   Doxycycline  Rash    Water blisters   Levaquin  [Levofloxacin  In D5w] Rash    Assessment/Plan:  1. Hyperlipidemia - Patient;s last LDL was 97 [facility] is above goal of <55. Aggressive goal due to CAD and CVA. Recommend addition of PCSK9i.  Using demo pen, educated patient on mechanism of action, storage, site selection, administration, and possible adverse effects. Patient able to demonstrate in room. Will complete PA and contact patient with result. Recheck lipid panel in 2-3 months  Start Repatha /Praluent q 2 weeks Recheck lipid panel in 3 months  Joelene Murrain, PharmD, BCACP, CDCES, CPP [address] [place], [place], [address] Phone: [phone], Fax: [phone]

## 2023-05-05 ENCOUNTER — Other Ambulatory Visit (HOSPITAL_COMMUNITY): Payer: Self-pay

## 2023-05-05 ENCOUNTER — Telehealth: Payer: Self-pay | Admitting: Pharmacy Technician

## 2023-05-05 ENCOUNTER — Telehealth: Payer: Self-pay

## 2023-05-05 NOTE — Telephone Encounter (Signed)
 Pharmacy Patient Advocate Encounter  Received notification from [facility] that Prior Authorization for repatha  has been APPROVED from 05/05/23 to 11/04/23. Ran test claim, Copay is $101.61- one month. This test claim was processed through [facility]- copay amounts may vary at other pharmacies due to pharmacy/plan contracts, or as the patient moves through the different stages of their insurance plan.   PA #/Case ID/Reference #: [id number]

## 2023-05-05 NOTE — Telephone Encounter (Signed)
 Praluent is plan/benefit exclusion per test claim

## 2023-05-05 NOTE — Telephone Encounter (Signed)
 Pharmacy Patient Advocate Encounter   Received notification from Physician's Office that prior authorization for [employer] is required/requested.   Insurance verification completed.   The patient is insured through [place] .   Per test claim:  REPATHA  is preferred by the insurance.    PRAULENT IS PLAN BENEFIT EXCLUSION

## 2023-05-05 NOTE — Telephone Encounter (Signed)
 Pharmacy Patient Advocate Encounter   Received notification from Pt Calls Messages that prior authorization for repatha  is required/requested.   Insurance verification completed.   The patient is insured through [place] .   Per test claim: PA required; PA submitted to above mentioned insurance via CoverMyMeds Key/confirmation #/EOC [id number] Status is pending

## 2023-05-06 ENCOUNTER — Other Ambulatory Visit (HOSPITAL_COMMUNITY): Payer: Self-pay

## 2023-05-06 ENCOUNTER — Telehealth: Payer: Self-pay

## 2023-05-06 DIAGNOSIS — I2510 Atherosclerotic heart disease of native coronary artery without angina pectoris: Secondary | ICD-10-CM

## 2023-05-06 DIAGNOSIS — E785 Hyperlipidemia, unspecified: Secondary | ICD-10-CM

## 2023-05-06 MED ORDER — REPATHA SURECLICK 140 MG/ML ~~LOC~~ SOAJ
1.0000 mL | SUBCUTANEOUS | 1 refills | Status: DC
  Filled 2023-05-06 – 2023-05-07 (×2): qty 6, 84d supply, fill #0

## 2023-05-06 NOTE — Telephone Encounter (Signed)
 Done, see separate encounter. Let me know where the RX is going to and I will relay the grant info to them.

## 2023-05-06 NOTE — Telephone Encounter (Signed)
 Follow Up:    Wife is calling, concerning the Repatha 

## 2023-05-06 NOTE — Telephone Encounter (Addendum)
 [employer] no longer is releasing grant funds until verification in the portal is complete. Colin Lopez was selected for diagnosis verification. Completed and uploaded form. Turnaround is approx 2 days. Rep advised to check back Tuesday. Will reach out to [facility] at that time to rerun RX.

## 2023-05-06 NOTE — Telephone Encounter (Addendum)
 Patient Advocate Encounter   The patient was approved for a [employer] grant that will help cover the cost of REPATHA  Total amount awarded, $2,500.  Effective: 04/06/23 - 04/04/24   [id number] [id number]  [facility]berta [facility]ans in [place], [facility] is sending RX in for delivery.  Sherlie Distance, CPhT  Pharmacy Patient Advocate Specialist  Direct Number: [phone] Fax: [phone]

## 2023-05-07 ENCOUNTER — Other Ambulatory Visit (HOSPITAL_COMMUNITY): Payer: Self-pay

## 2023-05-07 NOTE — Telephone Encounter (Signed)
 HW completed diagnosis verification. [facility] funds are now released. Royston Cornea, [facility] has RX filled for patient for $0 and setup for mail order.

## 2023-05-11 ENCOUNTER — Telehealth: Payer: Self-pay | Admitting: Cardiology

## 2023-05-11 ENCOUNTER — Other Ambulatory Visit (HOSPITAL_COMMUNITY): Payer: Self-pay

## 2023-05-11 DIAGNOSIS — I2510 Atherosclerotic heart disease of native coronary artery without angina pectoris: Secondary | ICD-10-CM

## 2023-05-11 DIAGNOSIS — E785 Hyperlipidemia, unspecified: Secondary | ICD-10-CM

## 2023-05-11 NOTE — Telephone Encounter (Signed)
 Pt c/o medication issue:  1. Name of Medication: Repatha   2. How are you currently taking this medication (dosage and times per day)?   3. Are you having a reaction (difficulty breathing--STAT)?   4. What is your medication issue?   Wife called to follow-up on patient getting Repatha  medication.

## 2023-05-11 NOTE — Telephone Encounter (Signed)
 Contacted pharmacy who reported medication was shipped out today. Called patient back and let her know

## 2023-05-12 ENCOUNTER — Telehealth: Payer: Self-pay | Admitting: Cardiology

## 2023-05-12 NOTE — Telephone Encounter (Signed)
 Error

## 2023-05-12 NOTE — Telephone Encounter (Signed)
 Spouse calling stating that they still have not rec'vd repatha  that was supposed to be delivered to them. Requesting cb

## 2023-05-12 NOTE — Telephone Encounter (Signed)
 Spoke with pt's wife, DPR who states pt has still not received Repatha  as of this afternoon.  Advised per pharmacy note medication was shipped yesterday.  Pt's wife states she was advised yesterday if not received by this afternoon to notify office.  Advised will forward to pharmacy team for further assistance.

## 2023-05-14 ENCOUNTER — Other Ambulatory Visit (HOSPITAL_COMMUNITY): Payer: Self-pay

## 2023-05-14 MED ORDER — REPATHA SURECLICK 140 MG/ML ~~LOC~~ SOAJ: 1.0000 mL | SUBCUTANEOUS | 1 refills | Status: DC

## 2023-05-14 NOTE — Telephone Encounter (Signed)
 Contacted patients wife again. Still has not received medication. Will route to [facility],

## 2023-05-14 NOTE — Addendum Note (Signed)
 Addended by: Sunny English on: 05/14/2023 04:00 PM   Modules accepted: Orders

## 2023-05-17 ENCOUNTER — Telehealth: Payer: Self-pay | Admitting: Cardiology

## 2023-05-17 DIAGNOSIS — I2510 Atherosclerotic heart disease of native coronary artery without angina pectoris: Secondary | ICD-10-CM

## 2023-05-17 DIAGNOSIS — E785 Hyperlipidemia, unspecified: Secondary | ICD-10-CM

## 2023-05-17 DIAGNOSIS — Z8673 Personal history of transient ischemic attack (TIA), and cerebral infarction without residual deficits: Secondary | ICD-10-CM

## 2023-05-17 NOTE — Telephone Encounter (Signed)
 Pt c/o medication issue:  1. Name of Medication:   Evolocumab  (REPATHA  SURECLICK) 140 MG/ML SOAJ   2. How are you currently taking this medication (dosage and times per day)?     3. Are you having a reaction (difficulty breathing--STAT)?   4. What is your medication issue?   Wife Amalia Badder) stated delivery company delivered medication days before to their back porch and wife stated they did not find the medication until Friday.  Wife stated when the box was opened the packaging had already melted.  Wife wants advice on next steps. Wife noted patient has been without medication for several weeks now and will be going out of town at the end of the week.

## 2023-05-17 NOTE — Telephone Encounter (Signed)
 Wife is following up requesting a call back from Dr. Christophe Cram nurse regarding this matter.

## 2023-05-18 ENCOUNTER — Telehealth: Payer: Self-pay

## 2023-05-18 MED ORDER — REPATHA SURECLICK 140 MG/ML ~~LOC~~ SOAJ
1.0000 mL | SUBCUTANEOUS | 0 refills | Status: DC
Start: 2023-05-18 — End: 2023-07-12

## 2023-05-18 NOTE — Telephone Encounter (Signed)
 Received a message patient upset his Repatha  was delivered and it was outside for 3 days.He wanted to know if ok to take.Called patient left message on personal voice mail to call back. Message sent to our Pharm D for advice.

## 2023-05-18 NOTE — Telephone Encounter (Signed)
 Spoke with patient's wife. Advised to not take medication since it is not known what temperature it was exposed to outside. Husband has echo scheduled tomorrow. Will provide samples.

## 2023-05-19 ENCOUNTER — Ambulatory Visit (HOSPITAL_COMMUNITY): Attending: Cardiology

## 2023-05-19 DIAGNOSIS — I2510 Atherosclerotic heart disease of native coronary artery without angina pectoris: Secondary | ICD-10-CM | POA: Insufficient documentation

## 2023-05-19 DIAGNOSIS — R0602 Shortness of breath: Secondary | ICD-10-CM | POA: Insufficient documentation

## 2023-05-19 LAB — ECHOCARDIOGRAM COMPLETE
Area-P 1/2: 3.13 cm2
S' Lateral: 3.9 cm

## 2023-05-19 NOTE — Telephone Encounter (Signed)
 Spoke with patient's wife yesterday. Should not take Repatha . There are samples ready for him in clinic.

## 2023-05-25 ENCOUNTER — Ambulatory Visit: Payer: Self-pay

## 2023-05-25 NOTE — Telephone Encounter (Signed)
 Left a detailed message on pts machine with echocardiogram results. Pt advised to call back if he has any questions or concerns.

## 2023-05-27 DIAGNOSIS — M5459 Other low back pain: Secondary | ICD-10-CM | POA: Diagnosis not present

## 2023-05-27 DIAGNOSIS — M170 Bilateral primary osteoarthritis of knee: Secondary | ICD-10-CM | POA: Diagnosis not present

## 2023-06-01 DIAGNOSIS — M5416 Radiculopathy, lumbar region: Secondary | ICD-10-CM | POA: Diagnosis not present

## 2023-06-15 ENCOUNTER — Telehealth: Payer: Self-pay

## 2023-06-15 DIAGNOSIS — M545 Low back pain, unspecified: Secondary | ICD-10-CM | POA: Diagnosis not present

## 2023-06-15 NOTE — Telephone Encounter (Signed)
   Pre-operative Risk Assessment    Patient Name: Colin Lopez  DOB: Dec 01, 1940 MRN: [id number]   Date of last office visit: 04/16/23 Marlana Silvan, NP Date of next office visit: 06/30/23 Marlana Silvan, NP   Request for Surgical Clearance    Procedure:  LUMBAR LAMINECTOMY  Date of Surgery:  Clearance TBD                                Surgeon:  DR Orvan Blanch Surgeon's Group or Practice Name:  [facility] Phone number:  [phone] Fax number:  [phone]  ATTN: Avanell Bob STONE   Type of Clearance Requested:   - Medical  - Pharmacy:  Hold Aspirin  and Clopidogrel  (Plavix )     Type of Anesthesia:  Not Indicated   Additional requests/questions:    Signed, Collin Deal   06/15/2023, 3:32 PM

## 2023-06-16 NOTE — Telephone Encounter (Signed)
   Name: Colin Lopez  DOB: 04/04/1940  MRN: [id number]  Primary Cardiologist: [name] Swaziland, MD  Chart reviewed as part of pre-operative protocol coverage. The patient has an upcoming visit scheduled with Marlana Silvan, NP on 06/30/2023 at which time clearance can be addressed in case there are any issues that would impact surgical recommendations.  I added preop FYI to appointment note so that provider is aware to address at time of outpatient visit.  Per office protocol the cardiology provider should forward their finalized clearance decision and recommendations regarding antiplatelet therapy to the requesting party below.    I will route this message as FYI to requesting party and remove this message from the preop box as separate preop APP input not needed at this time.   Please call with any questions.  Colin Lopez, Colin Cast, NP  06/16/2023, 7:56 AM

## 2023-06-24 ENCOUNTER — Other Ambulatory Visit: Payer: Self-pay | Admitting: Nurse Practitioner

## 2023-06-24 ENCOUNTER — Telehealth: Payer: Self-pay | Admitting: Nurse Practitioner

## 2023-06-24 NOTE — Telephone Encounter (Signed)
 lvm for pt to call office to schedule appt.

## 2023-06-24 NOTE — Telephone Encounter (Signed)
 Received request for surgical clearance, He needs an appointment with me for this please

## 2023-06-24 NOTE — Telephone Encounter (Signed)
 Patient spouse return call.Colin Lopez appt scheduled 07/21/23 at 3:20pm.. no sooner appts available in the afternoon.

## 2023-06-25 NOTE — Telephone Encounter (Signed)
 lvm for pt to call office to  see if we can get him in sooner than the schedule appt

## 2023-06-25 NOTE — Telephone Encounter (Signed)
 lvm for pt to call office to schedule appt.

## 2023-06-28 NOTE — Telephone Encounter (Signed)
3 attempts

## 2023-06-30 ENCOUNTER — Encounter: Payer: Self-pay | Admitting: Nurse Practitioner

## 2023-06-30 ENCOUNTER — Ambulatory Visit: Attending: Cardiology | Admitting: Nurse Practitioner

## 2023-06-30 VITALS — BP 130/60 | HR 67 | Ht 72.0 in | Wt 213.0 lb

## 2023-06-30 DIAGNOSIS — Z8673 Personal history of transient ischemic attack (TIA), and cerebral infarction without residual deficits: Secondary | ICD-10-CM | POA: Diagnosis not present

## 2023-06-30 DIAGNOSIS — M791 Myalgia, unspecified site: Secondary | ICD-10-CM | POA: Diagnosis not present

## 2023-06-30 DIAGNOSIS — I2510 Atherosclerotic heart disease of native coronary artery without angina pectoris: Secondary | ICD-10-CM

## 2023-06-30 DIAGNOSIS — T466X5D Adverse effect of antihyperlipidemic and antiarteriosclerotic drugs, subsequent encounter: Secondary | ICD-10-CM | POA: Diagnosis not present

## 2023-06-30 DIAGNOSIS — E785 Hyperlipidemia, unspecified: Secondary | ICD-10-CM | POA: Diagnosis not present

## 2023-06-30 DIAGNOSIS — R0609 Other forms of dyspnea: Secondary | ICD-10-CM | POA: Diagnosis not present

## 2023-06-30 DIAGNOSIS — I77810 Thoracic aortic ectasia: Secondary | ICD-10-CM

## 2023-06-30 DIAGNOSIS — Z01810 Encounter for preprocedural cardiovascular examination: Secondary | ICD-10-CM | POA: Diagnosis not present

## 2023-06-30 DIAGNOSIS — I10 Essential (primary) hypertension: Secondary | ICD-10-CM

## 2023-06-30 MED ORDER — NITROGLYCERIN 0.4 MG SL SUBL: 0.4000 mg | SUBLINGUAL_TABLET | SUBLINGUAL | 3 refills | Status: AC | PRN

## 2023-06-30 NOTE — Patient Instructions (Signed)
 Medication Instructions:  Your physician recommends that you continue on your current medications as directed. Please refer to the Current Medication list given to you today.  *If you need a refill on your cardiac medications before your next appointment, please call your pharmacy*  Lab Work: NONE ordered at this time of appointment   Testing/Procedures: NONE ordered at this time of appointment   Follow-Up: At [facility], you and your health needs are our priority.  As part of our continuing mission to provide you with exceptional heart care, our providers are all part of one team.  This team includes your primary Cardiologist (physician) and Advanced Practice Providers or APPs (Physician Assistants and Nurse Practitioners) who all work together to provide you with the care you need, when you need it.  Your next appointment:   6 month(s)  Provider:   [name] Swaziland, MD    We recommend signing up for the patient portal called MyChart.  Sign up information is provided on this After Visit Summary.  MyChart is used to connect with patients for Virtual Visits (Telemedicine).  Patients are able to view lab/test results, encounter notes, upcoming appointments, etc.  Non-urgent messages can be sent to your provider as well.   To learn more about what you can do with MyChart, go to [email or web address].

## 2023-06-30 NOTE — Progress Notes (Unsigned)
 Office Visit    Patient Name: Colin Lopez Date of Encounter: 06/30/2023  Primary Care Provider:  Dorothe Gaster, NP Primary Cardiologist:  [name] Swaziland, MD  Chief Complaint    83 year old male with a history of CAD s/p DES-LCx in 2009, DES-OM1, DES-distal LCx, DES-mid RCA in 2018, dilation of the ascending aorta, hypertension, hyperlipidemia, and CVA who presents for follow-up related to CAD.   Past Medical History    Past Medical History:  Diagnosis Date   CAD (coronary artery disease), native coronary artery    09/09/16 PCI/DES x3 to mRCA, OM1 and dLcx/OM2.    Coronary disease    Status post stenting of the left circumflex coronary in 2009 with a bare-metal stent (with a 3.5x50mm Liberte stent)   Dyslipidemia    Dyspnea on exertion    Exertional chest pain    Exposure to TB    Hyperlipidemia    Hypertension    Hypertriglyceridemia    NSTEMI (non-ST elevated myocardial infarction) (HCC) 2009   BMS CFX   Numbness and tingling of left side of face    with left lip/face droop (Concern for Bell's Palsy)   Rash and nonspecific skin eruption    Lower right anterior leg   Stroke (HCC)    TIA (transient ischemic attack)    history of tia   Urinary tract infection    Past Surgical History:  Procedure Laterality Date   CARDIOVASCULAR STRESS TEST  10-03-08   EF 59%   CORONARY ANGIOPLASTY WITH STENT PLACEMENT  09/09/2016   CORONARY STENT INTERVENTION N/A 09/09/2016   Procedure: CORONARY STENT INTERVENTION;  Surgeon: Swaziland, [name] M, MD;  Location: [facility] INVASIVE CV LAB;  Service: Cardiovascular;  Laterality: N/A;   LEFT HEART CATH AND CORONARY ANGIOGRAPHY N/A 09/09/2016   Procedure: LEFT HEART CATH AND CORONARY ANGIOGRAPHY;  Surgeon: Swaziland, [name] M, MD;  Location: [facility] INVASIVE CV LAB;  Service: Cardiovascular;  Laterality: N/A;   US  ECHOCARDIOGRAPHY  09-21-08   EF 55-60%    Allergies  Allergies  Allergen Reactions   Penicillins Hives    Has patient had a PCN reaction  causing immediate rash, facial/tongue/throat swelling, SOB or lightheadedness with hypotension: No Has patient had a PCN reaction causing severe rash involving mucus membranes or skin necrosis: Yes Has patient had a PCN reaction that required hospitalization: No Has patient had a PCN reaction occurring within the last 10 years: No If all of the above answers are NO, then may proceed with Cephalosporin use. Ceftriaxone  Ok   Doxycycline  Rash    Water blisters   Levaquin  [Levofloxacin  In D5w] Rash     Labs/Other Studies Reviewed    The following studies were reviewed today:  Cardiac Studies & Procedures   ______________________________________________________________________________________________ CARDIAC CATHETERIZATION  CARDIAC CATHETERIZATION 09/09/2016  Conclusion  Mid LAD to Dist LAD lesion, 30 %stenosed.  Prox Cx lesion, 25 %stenosed.  Mid RCA lesion, 95 %stenosed.  A STENT SIERRA 3.50 X 23 MM drug eluting stent was successfully placed.  Post intervention, there is a 0% residual stenosis.  Mid Cx lesion, 70 %stenosed.  A STENT SIERRA 2.75 X 12 MM drug eluting stent was successfully placed.  Post intervention, there is a 0% residual stenosis.  Ost 1st Mrg lesion, 90 %stenosed.  A STENT SIERRA 3.00 X 12 MM drug eluting stent was successfully placed.  Post intervention, there is a 0% residual stenosis.  1. Severe 2 vessel obstructive CAD - 90% ostial OM1 - 70% distal LCx/OM2 - 95%  mid RCA 2. Successful stenting of the mid RCA with DES 3. Successful stenting of the ostium of OM1 with DES 4. Successful stenting of the distal LCx/OM2  Plan: DAPT for at least one year. Anticipate DC in am.  Findings Coronary Findings Diagnostic  Dominance: Right  Left Anterior Descending The lesion is segmental.  Left Circumflex The lesion was previously treated using a bare metal stent over 2 years ago.  First Obtuse Marginal Branch Vessel is large in size.  Right  Coronary Artery Vessel is large.  Intervention  Mid Cx lesion Angioplasty Lesion crossed with guidewire using a WIRE ASAHI PROWATER 180CM. Pre-stent angioplasty was performed using a BALLOON SAPPHIRE 2.5X12. A STENT SIERRA 2.75 X 12 MM drug eluting stent was successfully placed. Stent strut is well apposed. Post-stent angioplasty was performed using a BALLOON [place] EMERGE MR 3.0X8. Maximum pressure: 16 atm. The pre-interventional distal flow is normal (TIMI 3).  The post-interventional distal flow is normal (TIMI 3). The intervention was successful . No complications occurred at this lesion. There is a 0% residual stenosis post intervention.  Ost 1st Mrg lesion Angioplasty Lesion crossed with guidewire using a BALLOON SAPPHIRE 2.5X12. A STENT SIERRA 3.00 X 12 MM drug eluting stent was successfully placed. Stent strut is well apposed. Post-stent angioplasty was performed using a BALLOON SAPPHIRE [place] 3.5X8. Maximum pressure: 16 atm. The pre-interventional distal flow is normal (TIMI 3).  The post-interventional distal flow is normal (TIMI 3). The intervention was successful . No complications occurred at this lesion. The OM 1 lesion was at the ostium. It had previously been stented across with BMS. The lesion was wired through the stent struts. The lesion was stented at the ostium ( T stent ). After it was postdilated there was some plaque shift into the LCx distal to the lesion but within the old stent. This was dilated with a 3.25 mm balloon with good result. There is a 0% residual stenosis post intervention.  Mid RCA lesion Angioplasty Lesion crossed with guidewire using a WIRE ASAHI PROWATER 180CM. Pre-stent angioplasty was performed using a BALLOON SAPPHIRE 2.5X12. Maximum pressure: 8 atm. A STENT SIERRA 3.50 X 23 MM drug eluting stent was successfully placed. Stent strut is well apposed. Post-stent angioplasty was performed using a BALLOON [place] EMERGE MR 4.0X15. Maximum pressure: 16 atm. The  pre-interventional distal flow is normal (TIMI 3).  The post-interventional distal flow is normal (TIMI 3). The intervention was successful . No complications occurred at this lesion. Unable to engage the RCA with a LA guide. HS guide engaged well but was inadequate to support delivery of stent to lesion. Guidezilla used for additional support with good results. There is a 0% residual stenosis post intervention.   STRESS TESTS  MYOCARDIAL PERFUSION IMAGING 12/25/2021  Narrative   The study is normal. The study is low risk.   No ST deviation was noted.   LV perfusion is normal. There is no evidence of ischemia. There is no evidence of infarction.   Left ventricular function is normal. Nuclear stress EF: 54 %. The left ventricular ejection fraction is mildly decreased (45-54%). End diastolic cavity size is normal. End systolic cavity size is normal.   Prior study available for comparison from 09/03/2016.   ECHOCARDIOGRAM  ECHOCARDIOGRAM COMPLETE 05/19/2023  Narrative ECHOCARDIOGRAM REPORT    Patient Name:   MAXEMILIANO RIEL Theiler Date of Exam: 05/19/2023 Medical Rec #:  [id number]      Height:       72.0 in Accession #:  [id number]     Weight:       211.0 lb Date of Birth:  1940-02-07      BSA:          2.180 m Patient Age:    82 years       BP:           147/78 mmHg Patient Gender: M              HR:           71 bpm. Exam Location:  [address]  Procedure: 2D Echo, 3D Echo, Cardiac Doppler, Color Doppler and Strain Analysis (Both Spectral and Color Flow Doppler were utilized during procedure).  Indications:    786.05 SOB  History:        Patient has prior history of Echocardiogram examinations, most recent 12/25/2021. NSTEMI, Stroke; Risk Factors:Hypertension and HLD.  Sonographer:    Juventino Oppenheim RCS Referring Phys: [id number] Oluwakemi Salsberry C Sakiyah Shur  IMPRESSIONS   1. Strain does not assess the basal septal and does not likely truly reflect LV function. . Left ventricular ejection fraction,  by estimation, is 55 to 60%. The left ventricle has normal function. The left ventricle has no regional wall motion abnormalities. Left ventricular diastolic parameters are indeterminate. The average left ventricular global longitudinal strain is -16.6 %. 2. Right ventricular systolic function is normal. The right ventricular size is normal. There is normal pulmonary artery systolic pressure. 3. Left atrial size was mildly dilated. 4. The mitral valve is degenerative. No evidence of mitral valve regurgitation. 5. The aortic valve is calcified. Aortic valve regurgitation is not visualized. Aortic valve sclerosis is present, with no evidence of aortic valve stenosis. 6. Aortic dilatation noted. There is mild dilatation of the ascending aorta, measuring 42 mm. 7. The inferior vena cava is normal in size with greater than 50% respiratory variability, suggesting right atrial pressure of 3 mmHg.  Comparison(s): No significant change from prior study.  FINDINGS Left Ventricle: Strain does not assess the basal septal and does not likely truly reflect LV function. Left ventricular ejection fraction, by estimation, is 55 to 60%. The left ventricle has normal function. The left ventricle has no regional wall motion abnormalities. The average left ventricular global longitudinal strain is -16.6 %. Strain was performed and the global longitudinal strain is indeterminate. The left ventricular internal cavity size was normal in size. There is no left ventricular hypertrophy. Left ventricular diastolic parameters are indeterminate.  Right Ventricle: The right ventricular size is normal. No increase in right ventricular wall thickness. Right ventricular systolic function is normal. There is normal pulmonary artery systolic pressure. The tricuspid regurgitant velocity is 2.25 m/s, and with an assumed right atrial pressure of 3 mmHg, the estimated right ventricular systolic pressure is 23.2 mmHg.  Left Atrium: Left  atrial size was mildly dilated.  Right Atrium: Right atrial size was normal in size.  Pericardium: There is no evidence of pericardial effusion.  Mitral Valve: The mitral valve is degenerative in appearance. Mild mitral annular calcification. No evidence of mitral valve regurgitation.  Tricuspid Valve: The tricuspid valve is normal in structure. Tricuspid valve regurgitation is trivial. No evidence of tricuspid stenosis.  Aortic Valve: The aortic valve is calcified. Aortic valve regurgitation is not visualized. Aortic valve sclerosis is present, with no evidence of aortic valve stenosis.  Pulmonic Valve: The pulmonic valve was normal in structure. Pulmonic valve regurgitation is not visualized. No evidence of pulmonic stenosis.  Aorta: Aortic dilatation noted. There is  mild dilatation of the ascending aorta, measuring 42 mm.  Venous: The inferior vena cava is normal in size with greater than 50% respiratory variability, suggesting right atrial pressure of 3 mmHg.  IAS/Shunts: The atrial septum is grossly normal.  Additional Comments: 3D was performed not requiring image post processing on an independent workstation and was normal.   LEFT VENTRICLE PLAX 2D LVIDd:         5.10 cm   Diastology LVIDs:         3.90 cm   LV e' medial:    3.92 cm/s LV PW:         1.20 cm   LV E/e' medial:  16.2 LV IVS:        1.00 cm   LV e' lateral:   6.64 cm/s LVOT diam:     2.10 cm   LV E/e' lateral: 9.5 LV SV:         64 LV SV Index:   29        2D Longitudinal Strain LVOT Area:     3.46 cm  2D Strain GLS (A4C):   -16.7 % 2D Strain GLS (A3C):   -16.5 % 2D Strain GLS (A2C):   -16.6 % 2D Strain GLS Avg:     -16.6 %  3D Volume EF: 3D EF:        50 % LV EDV:       96 ml LV ESV:       48 ml LV SV:        49 ml  RIGHT VENTRICLE RV Basal diam:  3.40 cm RV S prime:     11.00 cm/s TAPSE (M-mode): 1.7 cm RVSP:           23.2 mmHg  LEFT ATRIUM             Index        RIGHT ATRIUM            Index LA diam:        4.20 cm 1.93 cm/m   RA Pressure: 3.00 mmHg LA Vol (A2C):   77.7 ml 35.65 ml/m  RA Area:     16.20 cm LA Vol (A4C):   61.2 ml 28.08 ml/m  RA Volume:   38.30 ml  17.57 ml/m LA Biplane Vol: 75.1 ml 34.46 ml/m AORTIC VALVE LVOT Vmax:   79.80 cm/s LVOT Vmean:  53.900 cm/s LVOT VTI:    0.185 m  AORTA Ao Root diam: 3.70 cm Ao Asc diam:  4.20 cm  MITRAL VALVE                TRICUSPID VALVE MV Area (PHT):              TR Peak grad:   20.2 mmHg MV Decel Time:              TR Vmax:        225.00 cm/s MV E velocity: 63.40 cm/s   Estimated RAP:  3.00 mmHg MV A velocity: 102.00 cm/s  RVSP:           23.2 mmHg MV E/A ratio:  0.62 SHUNTS Systemic VTI:  0.18 m Systemic Diam: 2.10 cm  Gloriann Larger MD Electronically signed by Gloriann Larger MD Signature Date/Time: 05/19/2023/5:15:08 PM    Final          ______________________________________________________________________________________________     Recent Labs: 09/22/2022: Hemoglobin 13.4; Platelets 134.0; Pro B Natriuretic peptide (BNP) 55.0; TSH 3.25 04/22/2023:  ALT 17; BUN 25; Creatinine, Ser 1.32; Potassium 5.1; Sodium 139  Recent Lipid Panel    Component Value Date/Time   CHOL 151 04/22/2023 0824   TRIG 143 04/22/2023 0824   HDL 28 (L) 04/22/2023 0824   CHOLHDL 5.4 (H) 04/22/2023 0824   CHOLHDL 5 11/13/2016 0838   VLDL 39.0 11/13/2016 0838   LDLCALC 97 04/22/2023 0824   LDLDIRECT 74.0 12/17/2014 1505    History of Present Illness    83 year old male with the above past medical history including CAD s/p DES-LCx in 2009, DES-OM1, DES-distal LCx, DES-mid RCA in 2018, dilation of the ascending aorta, hypertension, hyperlipidemia, and CVA.   He was hospitalized in 2009 in the setting of NSTEMI.  He underwent stenting of the left circumflex artery.  Myoview  in 2018 showed multiple perfusion defects, EF 37%.  Follow-up cardiac catheterization in August 2018 showed three-vessel disease.  He  underwent successful stenting x 3 to the OM1, distal LCx, mid RCA.  He was hospitalized in September 2018 in the setting of CVA.  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  CT angiogram of the head and neck showed both ICA widely patent, atherosclerotic disease of the supraclinoid internal carotid arteries, no intracranial branch vessel disease.  Echocardiogram at the time showed EF 55 to 60%, G1 DD, no RWMA.  He was hospitalized in May 2021 in setting of AKI/UTI.  Most recent echocardiogram in 12/2022 in the setting of dyspnea on exertion showed EF 60 to 65%, normal LV function, no RWMA, G1 DD, normal RV systolic function, moderate MAC, mild calcification of the aortic valve, no evidence of aortic stenosis.  Lexiscan  Myoview  in 12/2021 was low risk, no evidence of ischemia or infarction, EF 54%.  He was last seen in the office on 05/02/2023 and was stable from a cardiac standpoint.  He reported stable dyspnea on exertion, rare fleeting chest discomfort, significant bilateral leg/joint pain.  Statin holiday was advised.  He was referred to lipid clinic Pharm.D. and was started on Repatha .  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   He presents today for follow-up accompanied by his wife for preoperative cardiac evaluation for upcoming lumbar laminectomy with Dr. Frances Ingles of [facility] with request to hold aspirin  and Plavix  prior to surgery. Since his last visit  He reports stable mild dyspnea on exertion.  Unchanged from prior visits.  He denies chest pain, palpitations, dizziness, edema, PND, orthopnea, weight gain.  Overall his symptoms have been stable.  Reviewed with Dr. Swaziland, who does not recommend any additional cardiac  testing at this time.  Especially given recent reassuring echo.  Okay to proceed with surgery.  He may hold Plavix  for 5 days prior to procedure.  He should continue aspirin  throughout the perioperative period per Dr. Swaziland.  Follow-up in 6 months.  Will plan to follow with routine repeat echocardiogram versus CT.   1. CAD/dyspnea on exertion:  S/p DES-LCx in 2009, DES-OM1, DES-distal LCx, DES-mid RCA in 2018. Reports ongoing dyspnea on exertion, which has increased over the past year. He reports rare fleeting chest discomfort which he describes as a shock wave that last for seconds at a time and resolves spontaneously.  He has not taken nitroglycerin  for his symptoms.   We  discussed possible stress test, however he declines at this point in time, also discussed antianginal therapy, he declines. He has stable dependent, nonpitting bilateral lower extremity edema, generally euvolemic and well compensated. Will update echo given ongoing shortness of breath. Reviewed ED precautions.  Continue aspirin , Plavix , Nebivolol , Lipitor , fenofibrate , Zetia .    2. Hypertension: BP well controlled. Continue current antihypertensive regimen.    3. Hyperlipidemia/statin myalgia: LDL was 90 in 04/2021. He reports significant bilateral leg/joint pain in his knees. This is his biggest concern today.  His symptoms are similar to those he experienced when taking Crestor , but worse. We discussed statin holiday, will refer to lipid clinic Pharm.D. for consideration of alternative lipid-lowering therapy as has a history of statin intolerance.  Will check fasting lipids, CMET.   4. Ascending aorta dilation:    5. History of CVA: No recurrence. Continue ASA, Plavix , Lipitor .   6. Preoperative cardiac exam: According to the Revised Cardiac Risk Index (RCRI), his Perioperative Risk of Major Cardiac Event is (%): 6.6  His Functional Capacity in METs is: 3.63 according to the [employer] Activity Status Index (DASI). Therefore, based  on ACC/AHA guidelines, patient would be at acceptable risk for the planned procedure without further cardiovascular testing. I will route this recommendation to the requesting party via Epic fax function.   7. Disposition: Follow-up in  Home Medications    Current Outpatient Medications  Medication Sig Dispense Refill   acetaminophen  (TYLENOL ) 500 MG tablet Take 1,000 mg by mouth every 6 (six) hours as needed for headache (pain).     aspirin  EC 81 MG tablet Take 1 tablet (81 mg total) by mouth daily. 90 tablet 3   clopidogrel  (PLAVIX ) 75 MG tablet TAKE 1 TABLET BY MOUTH EVERY DAY WITH BREAKFAST. 90 tablet 0   Evolocumab  (REPATHA  SURECLICK) 140 MG/ML SOAJ Inject 140 mg into the skin every 14 (fourteen) days. 4 mL 0   ezetimibe  (ZETIA ) 10 MG tablet Take 1 tablet (10 mg total) by mouth daily. 90 tablet 0   fenofibrate  160 MG tablet Take 1 tablet (160 mg total) by mouth daily. 90 tablet 0   nebivolol  (BYSTOLIC ) 10 MG tablet Take 1 tablet (10 mg total) by mouth daily. 90 tablet 0   nitroGLYCERIN  (NITROSTAT ) 0.4 MG SL tablet Place 1 tablet (0.4 mg total) under the tongue every 5 (five) minutes as needed for chest pain. 25 tablet 3   pantoprazole  (PROTONIX ) 40 MG tablet Take 1 tablet (40 mg total) by mouth daily. 90 tablet 0   tamsulosin  (FLOMAX ) 0.4 MG CAPS capsule Take 0.4 mg by mouth in the morning and at bedtime.     albuterol  (VENTOLIN  HFA) 108 (90 Base) MCG/ACT inhaler Inhale 2 puffs into the lungs every 6 (six) hours as needed for wheezing or shortness of breath. (Patient not taking: Reported on 06/30/2023) 8 g 2   atorvastatin  (LIPITOR ) 40 MG tablet Take 1 tablet (40 mg total) by mouth daily. (Patient not taking: Reported on 06/30/2023) 90 tablet 0   promethazine -dextromethorphan (PROMETHAZINE -DM) 6.25-15 MG/5ML syrup Take 5 mLs by mouth at bedtime as needed for cough. (Patient not taking: Reported on 06/30/2023) 118 mL 0   No current facility-administered medications for this visit.      Review of Systems    ***.  All other systems reviewed and are otherwise negative except as noted above.    Physical Exam    VS:  BP 130/60   Pulse 67   Ht 6' (1.829 m)   Wt 213  lb (96.6 kg)   SpO2 93%   BMI 28.89 kg/m  , BMI Body mass index is 28.89 kg/m.     GEN: Well nourished, well developed, in no acute distress. HEENT: normal. Neck: Supple, no JVD, carotid bruits, or masses. Cardiac: RRR, no murmurs, rubs, or gallops. No clubbing, cyanosis, edema.  Radials/DP/PT 2+ and equal bilaterally.  Respiratory:  Respirations regular and unlabored, clear to auscultation bilaterally. GI: Soft, nontender, nondistended, BS + x 4. MS: no deformity or atrophy. Skin: warm and dry, no rash. Neuro:  Strength and sensation are intact. Psych: Normal affect.  Accessory Clinical Findings    ECG personally reviewed by me today - EKG Interpretation Date/Time:  Wednesday June 30 2023 15:20:11 EDT Ventricular Rate:  67 PR Interval:  174 QRS Duration:  112 QT Interval:  408 QTC Calculation: 431 R Axis:   -7  Text Interpretation: Normal sinus rhythm Normal ECG When compared with ECG of 16-Apr-2023 14:47, No significant change was found Confirmed by Marlana Silvan ([id number]) on 06/30/2023 3:41:21 PM  - no acute changes.   Lab Results  Component Value Date   WBC 7.3 09/22/2022   HGB 13.4 09/22/2022   HCT 41.0 09/22/2022   MCV 93.0 09/22/2022   PLT 134.0 (L) 09/22/2022   Lab Results  Component Value Date   CREATININE 1.32 (H) 04/22/2023   BUN 25 04/22/2023   NA 139 04/22/2023   K 5.1 04/22/2023   CL 103 04/22/2023   CO2 22 04/22/2023   Lab Results  Component Value Date   ALT 17 04/22/2023   AST 27 04/22/2023   ALKPHOS 54 04/22/2023   BILITOT 0.6 04/22/2023   Lab Results  Component Value Date   CHOL 151 04/22/2023   HDL 28 (L) 04/22/2023   LDLCALC 97 04/22/2023   LDLDIRECT 74.0 12/17/2014   TRIG 143 04/22/2023   CHOLHDL 5.4 (H) 04/22/2023    Lab Results  Component Value  Date   HGBA1C 5.1 10/12/2016    Assessment & Plan    1.  ***      Jude Norton, NP 06/30/2023, 3:45 PM

## 2023-07-01 ENCOUNTER — Encounter: Payer: Self-pay | Admitting: Nurse Practitioner

## 2023-07-11 ENCOUNTER — Other Ambulatory Visit: Payer: Self-pay | Admitting: Cardiology

## 2023-07-12 ENCOUNTER — Telehealth: Payer: Self-pay | Admitting: Pharmacist

## 2023-07-12 ENCOUNTER — Telehealth: Payer: Self-pay | Admitting: Pharmacy Technician

## 2023-07-12 ENCOUNTER — Other Ambulatory Visit: Payer: Self-pay | Admitting: Cardiology

## 2023-07-12 DIAGNOSIS — N3000 Acute cystitis without hematuria: Secondary | ICD-10-CM | POA: Diagnosis not present

## 2023-07-12 DIAGNOSIS — E785 Hyperlipidemia, unspecified: Secondary | ICD-10-CM

## 2023-07-12 DIAGNOSIS — I2510 Atherosclerotic heart disease of native coronary artery without angina pectoris: Secondary | ICD-10-CM

## 2023-07-12 DIAGNOSIS — R350 Frequency of micturition: Secondary | ICD-10-CM | POA: Diagnosis not present

## 2023-07-12 DIAGNOSIS — Z8673 Personal history of transient ischemic attack (TIA), and cerebral infarction without residual deficits: Secondary | ICD-10-CM

## 2023-07-12 DIAGNOSIS — N281 Cyst of kidney, acquired: Secondary | ICD-10-CM | POA: Diagnosis not present

## 2023-07-12 MED ORDER — REPATHA SURECLICK 140 MG/ML ~~LOC~~ SOAJ: 1.0000 mL | SUBCUTANEOUS | 1 refills | Status: DC

## 2023-07-12 NOTE — Telephone Encounter (Signed)
 Called [facility] to give them the [employer] grant information for his repatha . Copay now 0.00. tried to call pt but his ph number rang busy. Sent chris a message

## 2023-07-12 NOTE — Telephone Encounter (Signed)
 Patient requests refill of Repatha  but to her local pharmacy.  Currently on grant program:  Card No. [id number] Card Status Active BIN [id number] PCN [id number] PC Group [id number]

## 2023-07-21 ENCOUNTER — Encounter: Payer: Self-pay | Admitting: Nurse Practitioner

## 2023-07-21 ENCOUNTER — Ambulatory Visit (INDEPENDENT_AMBULATORY_CARE_PROVIDER_SITE_OTHER)
Admission: RE | Admit: 2023-07-21 | Discharge: 2023-07-21 | Disposition: A | Discharge status: Home / Self Care | Source: Ambulatory Visit | Attending: Nurse Practitioner | Admitting: Nurse Practitioner

## 2023-07-21 ENCOUNTER — Ambulatory Visit (INDEPENDENT_AMBULATORY_CARE_PROVIDER_SITE_OTHER): Admitting: Nurse Practitioner

## 2023-07-21 VITALS — BP 134/68 | HR 69 | Temp 97.5°F | Ht 72.0 in | Wt 210.4 lb

## 2023-07-21 DIAGNOSIS — R0601 Orthopnea: Secondary | ICD-10-CM | POA: Insufficient documentation

## 2023-07-21 DIAGNOSIS — R051 Acute cough: Secondary | ICD-10-CM

## 2023-07-21 DIAGNOSIS — Z01818 Encounter for other preprocedural examination: Secondary | ICD-10-CM

## 2023-07-21 DIAGNOSIS — M7989 Other specified soft tissue disorders: Secondary | ICD-10-CM | POA: Diagnosis not present

## 2023-07-21 DIAGNOSIS — R059 Cough, unspecified: Secondary | ICD-10-CM | POA: Diagnosis not present

## 2023-07-21 DIAGNOSIS — R918 Other nonspecific abnormal finding of lung field: Secondary | ICD-10-CM | POA: Diagnosis not present

## 2023-07-21 DIAGNOSIS — I700 Atherosclerosis of aorta: Secondary | ICD-10-CM | POA: Diagnosis not present

## 2023-07-21 NOTE — Assessment & Plan Note (Signed)
 Has been cleared by cardiology. Patient has acceptable risk for procedure. If blood work and xray come back normal the plan is to clear the patient for procedure

## 2023-07-21 NOTE — Assessment & Plan Note (Signed)
 Pending BNP and CXR. Reviewed most recent echo

## 2023-07-21 NOTE — Progress Notes (Signed)
 Acute Office Visit  Subjective:     Patient ID: Colin Lopez, male    DOB: 17-May-1940, 83 y.o.   MRN: [id number]  Chief Complaint  Patient presents with   surgical clearance    HPI Patient is in today for preoperative clearance  Patient is scheduled to undergo a lumbar laminectomy under the direction of Reyes Billing, MD  Patient does have a history of hypertension, CVA, coronary artery disease.  Patient is followed by cardiology most recent echo done on 05/19/2023 which showed an EF of 55 to 60%.  Most recent ECG was done 06/30/2023 which was within normal limits.  Patient has been cardiology on 06/30/2023 and was cleared from a cardiac perspective to have surgery. Patient does have a history of coronary angioplasty with stent placement in 2018.  Of note patient was instructed to hold Plavix  5 days prior to procedure.  Anesthesia trouble: states that he has not done anesthesia in the past. No trouble as he nknows. 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  Respiratory:  Positive for cough and shortness of breath (with laying down.).   Cardiovascular:  Negative for chest pain and leg swelling.  Gastrointestinal:  Negative for abdominal pain, blood in stool, constipation, diarrhea, nausea and vomiting.       BM daily   Genitourinary:  Negative for dysuria and hematuria.       Nocturia followed by urology   Neurological:  Negative for dizziness (with position changes), tingling and headaches.        Objective:    BP 134/68   Pulse 69   Temp (!) 97.5 F (36.4 C) (Oral)   Ht 6' (1.829 m)   Wt 210 lb 6.4 oz (95.4 kg)   SpO2 96%   BMI 28.54 kg/m  BP Readings from Last 3 Encounters:  07/21/23 134/68  06/30/23 130/60  04/16/23 134/72   Wt Readings from Last 3 Encounters:  07/21/23 210  lb 6.4 oz (95.4 kg)  06/30/23 213 lb (96.6 kg)  04/16/23 211 lb (95.7 kg)   SpO2 Readings from Last 3 Encounters:  07/21/23 96%  06/30/23 93%  04/16/23 91%      Physical Exam Vitals and nursing note reviewed.  Constitutional:      Appearance: Normal appearance.  HENT:     Right Ear: Tympanic membrane, ear canal and external ear normal.     Left Ear: Tympanic membrane, ear canal and external ear normal.     Mouth/Throat:     Mouth: Mucous membranes are moist.     Pharynx: Oropharynx is clear.  Eyes:     Extraocular Movements: Extraocular movements intact.     Pupils: Pupils are equal, round, and reactive to light.     Comments: Arcus senilis   Cardiovascular:     Rate and Rhythm: Normal rate and regular rhythm.     Heart sounds: Normal heart sounds.  Pulmonary:     Effort: Pulmonary effort is normal.     Breath sounds: Normal breath sounds.     Comments: Slightly decreased in bases bilaterally  Abdominal:     General: Bowel sounds are normal.  Musculoskeletal:     Right lower leg: Edema present.     Left lower leg: Edema present.  Neurological:  General: No focal deficit present.     Mental Status: He is alert.     Comments: Bilateral upper and lower extremity strength 5/5     No results found for any visits on 07/21/23.      Assessment & Plan:   Problem List Items Addressed This Visit       Other   Acute cough   Pending cxr      Pre-operative clearance - Primary   Has been cleared by cardiology. Patient has acceptable risk for procedure. If blood work and xray come back normal the plan is to clear the patient for procedure       Relevant Orders   DG Chest 2 View   Leg swelling   Pending BNP and CXR. Reviewed most recent echo       Relevant Orders   CBC   Brain natriuretic peptide   Orthopnea   Recent echo reviewed with out evidence of HF. Pitting lower extremity edema. Will check BNP and chest xray       Relevant Orders   CBC   Brain  natriuretic peptide    No orders of the defined types were placed in this encounter.   Return in about 2 months (around 09/21/2023) for CPE and Labs.  Adina Crandall, NP

## 2023-07-21 NOTE — Assessment & Plan Note (Signed)
Pending cxr

## 2023-07-21 NOTE — Assessment & Plan Note (Signed)
 Recent echo reviewed with out evidence of HF. Pitting lower extremity edema. Will check BNP and chest xray

## 2023-07-21 NOTE — Patient Instructions (Signed)
 Nice to see you today I will be in touch with the labs and xrays once I have reviewed them Follow up with me in 2 months for your physical

## 2023-07-22 LAB — CBC
HCT: 39.2 % (ref 39.0–52.0)
Hemoglobin: 13.2 g/dL (ref 13.0–17.0)
MCHC: 33.6 g/dL (ref 30.0–36.0)
MCV: 90.9 fl (ref 78.0–100.0)
Platelets: 129 K/uL — ABNORMAL LOW (ref 150.0–400.0)
RBC: 4.31 Mil/uL (ref 4.22–5.81)
RDW: 14.8 % (ref 11.5–15.5)
WBC: 6.5 K/uL (ref 4.0–10.5)

## 2023-07-22 LAB — BRAIN NATRIURETIC PEPTIDE: Pro B Natriuretic peptide (BNP): 55 pg/mL (ref 0.0–100.0)

## 2023-07-23 ENCOUNTER — Ambulatory Visit: Payer: Self-pay | Admitting: Nurse Practitioner

## 2023-07-23 ENCOUNTER — Encounter: Payer: Self-pay | Admitting: Nurse Practitioner

## 2023-07-29 ENCOUNTER — Ambulatory Visit: Payer: Self-pay | Admitting: Orthopedic Surgery

## 2023-07-29 DIAGNOSIS — M48062 Spinal stenosis, lumbar region with neurogenic claudication: Secondary | ICD-10-CM

## 2023-08-04 ENCOUNTER — Ambulatory Visit: Payer: Self-pay | Admitting: Orthopedic Surgery

## 2023-08-04 NOTE — H&P (Signed)
 Colin Lopez is an 83 y.o. male.   Chief Complaint: back and leg pain HPI: Reason for Visit: (normal) review of test results (lumbar MRI) Context: 4-5 years Location (Lower Extremity): lower back pain Severity: pain level 3/10 Timing: constant Quality: c/o muscle spasms Aggravating Factors: getting up from a seated position on the floor Associated Symptoms: no numbness/tingling Medications: no meds for the back Reports continued falls weakness in his legs occasional episode of incontinence.. Reports weakness into his legs. This has been going 4 to 5 years. When he gets to the floor he can get off the floor due to weakness. This despite doing home exercises  Past Medical History:  Diagnosis Date   CAD (coronary artery disease), native coronary artery    09/09/16 PCI/DES x3 to mRCA, OM1 and dLcx/OM2.    Coronary disease    Status post stenting of the left circumflex coronary in 2009 with a bare-metal stent (with a 3.5x70mm Liberte stent)   Dyslipidemia    Dyspnea on exertion    Exertional chest pain    Exposure to TB    Hyperlipidemia    Hypertension    Hypertriglyceridemia    NSTEMI (non-ST elevated myocardial infarction) (HCC) 2009   BMS CFX   Numbness and tingling of left side of face    with left lip/face droop (Concern for Bell's Palsy)   Rash and nonspecific skin eruption    Lower right anterior leg   Stroke (HCC)    TIA (transient ischemic attack)    history of tia   Urinary tract infection     Past Surgical History:  Procedure Laterality Date   CARDIOVASCULAR STRESS TEST  10-03-08   EF 59%   CORONARY ANGIOPLASTY WITH STENT PLACEMENT  09/09/2016   CORONARY STENT INTERVENTION N/A 09/09/2016   Procedure: CORONARY STENT INTERVENTION;  Surgeon: Swaziland, [name] M, MD;  Location: [facility]ASIVE CV [facility];  Service: Cardiovascular;  Laterality: N/A;   LEFT HEART CATH AND CORONARY ANGIOGRAPHY N/A 09/09/2016   Procedure: LEFT HEART CATH AND CORONARY ANGIOGRAPHY;  Surgeon: Swaziland,  [name] M, MD;  Location: [facility] INVASIVE CV LAB;  Service: Cardiovascular;  Laterality: N/A;   US  ECHOCARDIOGRAPHY  09-21-08   EF 55-60%    Family History  Problem Relation Age of Onset   Diabetes Mother    CAD Sister 61       MI, obese   Cancer Brother        stomach   Social History:  reports that he has never smoked. He has never used smokeless tobacco. He reports current alcohol use. He reports that he does not use drugs.  Allergies:  Allergies  Allergen Reactions   Penicillins Hives    Has patient had a PCN reaction causing immediate rash, facial/tongue/throat swelling, SOB or lightheadedness with hypotension: No Has patient had a PCN reaction causing severe rash involving mucus membranes or skin necrosis: Yes Has patient had a PCN reaction that required hospitalization: No Has patient had a PCN reaction occurring within the last 10 years: No If all of the above answers are NO, then may proceed with Cephalosporin use. Ceftriaxone  Ok   Doxycycline  Rash    Water blisters   Levaquin  [Levofloxacin  In D5w] Rash    Current meds: atorvastatin  40 mg tablet chlorhexidine gluconate 4 % topical liquid clopidogreL  75 mg tablet ezetimibe  10 mg tablet fenofibrate  160 mg tablet finasteride 5 mg tablet ipratropium bromide  21 mcg (0.03 %) nasal spray nebivoloL  10 mg tablet pantoprazole  40 mg tablet,delayed release  promethazine -DM 6.25 mg-15 mg/5 mL oral syrup tamsulosin  0.4 mg capsule Ventolin  HFA 90 mcg/actuation aerosol inhaler  Review of Systems  Constitutional: Negative.   HENT: Negative.    Eyes: Negative.   Respiratory: Negative.    Cardiovascular: Negative.   Gastrointestinal: Negative.   Endocrine: Negative.   Genitourinary: Negative.   Musculoskeletal:  Positive for back pain and gait problem.  Skin: Negative.   Neurological:  Positive for weakness and numbness.  Psychiatric/Behavioral: Negative.      There were no vitals taken for this visit. Physical  Exam Constitutional:      Appearance: Normal appearance.  HENT:     Head: Normocephalic and atraumatic.     Right Ear: External ear normal.     Left Ear: External ear normal.     Nose: Nose normal.     Mouth/Throat:     Pharynx: Oropharynx is clear.  Eyes:     Conjunctiva/sclera: Conjunctivae normal.  Cardiovascular:     Rate and Rhythm: Normal rate and regular rhythm.     Pulses: Normal pulses.     Heart sounds: Normal heart sounds.  Pulmonary:     Effort: Pulmonary effort is normal.     Breath sounds: Normal breath sounds.  Abdominal:     General: Bowel sounds are normal.  Musculoskeletal:     Cervical back: Normal range of motion.     Comments: Walks with a forward flexed antalgic gait straight leg raise low back and buttock pain. Quadricep 5-/5 bilaterally. Hyporeflexic. No DVT  Skin:    General: Skin is warm and dry.  Neurological:     Mental Status: He is alert.   Lumbar MRI  1. Transitional anatomy, with partial lumbarization of St and a rudimentary intervertebral disc at $1-2 Correlation with dedicated radiographs is recommended for confirmation of this numbering system prior to any planned intervention 2. Multilevel degenerative changes of the lumbar spine, superimposed upon mild to moderate levoconvex curvature and congenitally short pedicles. 3. At L4-5, small subligamentous 2-3 mm right-sided intraspinal synovial cyst. Severe central and moderate to severe bilateral suburticular zone stenosis. Moderate to severe right and moderate left foraminal stenosis  4 At LS-S1, mild to moderate central stenosis with moderate left and mild to moderate right subarticular zone stenosis Moderate to severe left and mild to moderate right foraminal stenosis 5. ALL3-4, mild to moderate bilateral subarticular zone stenosis. Mild to moderate biforaminal stenosis 6. Partially imaged probable bilateral renal/purapelvic cysts, including a partially maged possible multilobulated complex or  hemorrhagic left renal cyst, incompletely characterized. A solid renal neoplasm can not be exchuded on the left. Clinical correlation is recommended, with a dedicated renal ultrasound for further evaluation, if not previously characterized   Assessment/Plan Impression:  1. 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 Refractory to home exercise program activity modification 2. Incidental renal cyst. 3. History of cardiac stent on aspirin  and Plavix  4. 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 An excision of the synovial cyst I had an extensive discussion with the patient concerning the pathology relevant anatomy and treatment options. At this point exhausting conservative treatment and in the presence of a neurologic deficit we discussed microlumbar decompression. I discussed the risks and benefits including bleeding, infection, DVT, PE, anesthetic complications, worsening in their symptoms, improvement in their symptoms, C SF leakage, epidural  fibrosis, need for future surgeries such as revision discectomy and lumbar fusion. I also indicated that this is an operation to basically decompress the nerve roots to allow recovery as opposed to fixing a herniated disc if it is encountered and that the incidence of recurrent chest disc herniation can approach 15%. Also that nerve root recovery is variable and may not recover completely. Any ligament or bone that is contributing to compressing the nerves will be removed as well.  I discussed the operative course including overnight in the hospital. Immediate ambulation. Follow-up in 2 weeks for suture removal. 6 weeks until healing of the herniation and surgical incision followed by 6 weeks of reconditioning and strengthening of the core  musculature. Also discussed the need to employ the concepts of disc pressure management and core motion following the surgery to minimize the risk of recurrent disc herniation. We will obtain preoperative clearance i if necessary and proceed accordingly.  Operative clearance PCP and cardiologist. He is on Plavix  and aspirin . He will have to quit at least 10 days before for his aspirin  and for 5 days after.  Avoid extension favor flexion.  He has residual knee pain following this he may require a total knee on the right.  Chlorhexidine.  Copy of his MRI report to his upcoming appointment with his urologist. Further imaging to their discretion.  Will have him sent to therapy for prehab.  Plan central laminectomy L4-5, excision of synovial cyst  Darice CHRISTELLA Randy, PA-C for Dr Duwayne 08/04/2023, 8:48 AM

## 2023-08-11 NOTE — Progress Notes (Signed)
 Surgical Instructions   Your procedure is scheduled on Thursday, August 19, 2023. Report to [facility] Main Entrance A at 5:30 A.M., then check in with the Admitting office. Any questions or running late day of surgery: call [phone]  Questions prior to your surgery date: call [phone], Monday-Friday, 8am-4pm. If you experience any cold or flu symptoms such as cough, fever, chills, shortness of breath, etc. between now and your scheduled surgery, please notify us  at the above number.     Remember:  Do not eat after midnight the night before your surgery  You may drink clear liquids until 4:30 the morning of your surgery.   Clear liquids allowed are: Water, Non-Citrus Juices (without pulp), Carbonated Beverages, Clear Tea (no milk, honey, etc.), Black Coffee Only (NO MILK, CREAM OR POWDERED CREAMER of any kind), and Gatorade.  Patient Instructions  The night before surgery:  No food after midnight. ONLY clear liquids after midnight  The day of surgery (if you do NOT have diabetes):  Drink ONE (1) Pre-Surgery Clear Ensure by 4:30 the morning of surgery. Drink in one sitting. Do not sip.  This drink was given to you during your hospital  pre-op appointment visit.  Nothing else to drink after completing the  Pre-Surgery Clear Ensure.         If you have questions, please contact your surgeon's office.    Take these medicines the morning of surgery with A SIP OF WATER  ezetimibe  (ZETIA )  pantoprazole  (PROTONIX )  tamsulosin  (FLOMAX )   May take these medicines IF NEEDED: acetaminophen  (TYLENOL )  nitroGLYCERIN  (NITROSTAT ) If you have to take this medication prior to surgery, please call [phone] and report this to a nurse   PER YOUR SURGEON'S INSTRUCTIONS,  HOLD YOUR ASPIRIN  10 DAYS PRIOR TO SURGERY WITH THE LAST DOSE BEING 08/08/2023.   PER YOUR CARDIOLOGIST'S INSTRUCTIONS, HOLD YOUR clopidogrel  (PLAVIX ) 5 DAYS PRIOR TO SURGERY WITH THE LAST DOSE BEING 7/08/13/2023.      One week prior to surgery, STOP taking any Aleve, Naproxen, Ibuprofen, Motrin, Advil, Goody's, BC's, all herbal medications, fish oil, and non-prescription vitamins.                     Do NOT Smoke (Tobacco/Vaping) for 24 hours prior to your procedure.  If you use a CPAP at night, you may bring your mask/headgear for your overnight stay.   You will be asked to remove any contacts, glasses, piercing's, hearing aid's, dentures/partials prior to surgery. Please bring cases for these items if needed.    Patients discharged the day of surgery will not be allowed to drive home, and someone needs to stay with them for 24 hours.  SURGICAL WAITING ROOM VISITATION Patients may have no more than 2 support people in the waiting area - these visitors may rotate.   Pre-op nurse will coordinate an appropriate time for 1 ADULT support person, who may not rotate, to accompany patient in pre-op.  Children under the age of 67 must have an adult with them who is not the patient and must remain in the main waiting area with an adult.  If the patient needs to stay at the hospital during part of their recovery, the visitor guidelines for inpatient rooms apply.  Please refer to the [facility] website for the visitor guidelines for any additional information.   If you received a COVID test during your pre-op visit  it is requested that you wear a mask when out in public, stay  away from anyone that may not be feeling well and notify your surgeon if you develop symptoms. If you have been in contact with anyone that has tested positive in the last 10 days please notify you surgeon.      Pre-operative 5 CHG Bathing Instructions   You can play a key role in reducing the risk of infection after surgery. Your skin needs to be as free of germs as possible. You can reduce the number of germs on your skin by washing with CHG (chlorhexidine gluconate) soap before surgery. CHG is an antiseptic soap that kills germs  and continues to kill germs even after washing.   DO NOT use if you have an allergy to chlorhexidine/CHG or antibacterial soaps. If your skin becomes reddened or irritated, stop using the CHG and notify one of our RNs at [phone].   Please shower with the CHG soap starting 4 days before surgery using the following schedule:     Please keep in mind the following:  You may shave your face at any point before/day of surgery.  Place clean sheets on your bed the day you start using CHG soap. Use a clean washcloth (not used since being washed) for each shower. DO NOT sleep with pets once you start using the CHG.   CHG Shower Instructions:  Wash your face and private area with normal soap. If you choose to wash your hair, wash first with your normal shampoo.  After you use shampoo/soap, rinse your hair and body thoroughly to remove shampoo/soap residue.  Turn the water OFF and apply about 3 tablespoons (45 ml) of CHG soap to a CLEAN washcloth.  Apply CHG soap ONLY FROM YOUR NECK DOWN TO YOUR TOES (washing for 3-5 minutes)  DO NOT use CHG soap on face, private areas, open wounds, or sores.  Pay special attention to the area where your surgery is being performed.  If you are having back surgery, having someone wash your back for you may be helpful. Wait 2 minutes after CHG soap is applied, then you may rinse off the CHG soap.  Pat dry with a clean towel  Put on clean clothes/pajamas   If you choose to wear lotion, please use ONLY the CHG-compatible lotions that are listed below.  Additional instructions for the day of surgery: DO NOT APPLY any lotions, deodorants or cologne.   Do not bring valuables to the hospital. [facility] is not responsible for any belongings/valuables. Do not wear jewelry Put on clean/comfortable clothes.  Please brush your teeth.  Ask your nurse before applying any prescription medications to the skin.     CHG Compatible Lotions   Aveeno Moisturizing  lotion  Cetaphil Moisturizing Cream  Cetaphil Moisturizing Lotion  Clairol Herbal Essence Moisturizing Lotion, Dry Skin  Clairol Herbal Essence Moisturizing Lotion, Extra Dry Skin  Clairol Herbal Essence Moisturizing Lotion, Normal Skin  Curel Age Defying Therapeutic Moisturizing Lotion with Alpha Hydroxy  Curel Extreme Care Body Lotion  Curel Soothing Hands Moisturizing Hand Lotion  Curel Therapeutic Moisturizing Cream, Fragrance-Free  Curel Therapeutic Moisturizing Lotion, Fragrance-Free  Curel Therapeutic Moisturizing Lotion, Original Formula  Eucerin Daily Replenishing Lotion  Eucerin Dry Skin Therapy Plus Alpha Hydroxy Crme  Eucerin Dry Skin Therapy Plus Alpha Hydroxy Lotion  Eucerin Original Crme  Eucerin Original Lotion  Eucerin Plus Crme Eucerin Plus Lotion  Eucerin TriLipid Replenishing Lotion  Keri Anti-Bacterial Hand Lotion  Keri Deep Conditioning Original Lotion Dry Skin Formula Softly Scented  Keri Deep Conditioning Original Lotion,  Fragrance Free Sensitive Skin Formula  Keri Lotion Fast Absorbing Fragrance Free Sensitive Skin Formula  Keri Lotion Fast Absorbing Softly Scented Dry Skin Formula  Keri Original Lotion  Keri Skin Renewal Lotion Keri Silky Smooth Lotion  Keri Silky Smooth Sensitive Skin Lotion  Nivea Body Creamy Conditioning Oil  Nivea Body Extra Enriched Lotion  Nivea Body Original Lotion  Nivea Body Sheer Moisturizing Lotion Nivea Crme  Nivea Skin Firming Lotion  NutraDerm 30 Skin Lotion  NutraDerm Skin Lotion  NutraDerm Therapeutic Skin Cream  NutraDerm Therapeutic Skin Lotion  ProShield Protective Hand Cream  Provon moisturizing lotion  Please read over the following fact sheets that you were given.

## 2023-08-12 ENCOUNTER — Ambulatory Visit (HOSPITAL_COMMUNITY)
Admission: RE | Admit: 2023-08-12 | Discharge: 2023-08-12 | Disposition: A | Discharge status: Home / Self Care | Source: Ambulatory Visit | Attending: Orthopedic Surgery | Admitting: Orthopedic Surgery

## 2023-08-12 ENCOUNTER — Inpatient Hospital Stay (HOSPITAL_COMMUNITY)
Admission: RE | Admit: 2023-08-12 | Discharge: 2023-08-12 | Discharge status: Home / Self Care | Source: Ambulatory Visit | Attending: Specialist | Admitting: Specialist

## 2023-08-12 ENCOUNTER — Encounter (HOSPITAL_COMMUNITY): Payer: Self-pay

## 2023-08-12 ENCOUNTER — Other Ambulatory Visit: Payer: Self-pay

## 2023-08-12 VITALS — BP 163/91 | HR 62 | Temp 98.4°F | Resp 18 | Ht 72.0 in | Wt 210.4 lb

## 2023-08-12 DIAGNOSIS — M48062 Spinal stenosis, lumbar region with neurogenic claudication: Secondary | ICD-10-CM | POA: Diagnosis not present

## 2023-08-12 DIAGNOSIS — M48061 Spinal stenosis, lumbar region without neurogenic claudication: Secondary | ICD-10-CM | POA: Diagnosis not present

## 2023-08-12 DIAGNOSIS — M438X6 Other specified deforming dorsopathies, lumbar region: Secondary | ICD-10-CM | POA: Diagnosis not present

## 2023-08-12 DIAGNOSIS — Z01818 Encounter for other preprocedural examination: Secondary | ICD-10-CM | POA: Insufficient documentation

## 2023-08-12 DIAGNOSIS — M51369 Other intervertebral disc degeneration, lumbar region without mention of lumbar back pain or lower extremity pain: Secondary | ICD-10-CM | POA: Diagnosis not present

## 2023-08-12 HISTORY — DX: Pneumonia, unspecified organism: J18.9

## 2023-08-12 HISTORY — DX: Gastro-esophageal reflux disease without esophagitis: K21.9

## 2023-08-12 LAB — BASIC METABOLIC PANEL WITH GFR
Anion gap: 7 (ref 5–15)
BUN: 20 mg/dL (ref 8–23)
CO2: 25 mmol/L (ref 22–32)
Calcium: 9.2 mg/dL (ref 8.9–10.3)
Chloride: 102 mmol/L (ref 98–111)
Creatinine, Ser: 1.23 mg/dL (ref 0.61–1.24)
GFR, Estimated: 58 mL/min — ABNORMAL LOW (ref >60)
Glucose, Bld: 102 mg/dL — ABNORMAL HIGH (ref 70–99)
Potassium: 4.6 mmol/L (ref 3.5–5.1)
Sodium: 134 mmol/L — ABNORMAL LOW (ref 135–145)

## 2023-08-12 LAB — SURGICAL PCR SCREEN
MRSA, PCR: NEGATIVE
Staphylococcus aureus: NEGATIVE

## 2023-08-12 NOTE — Progress Notes (Addendum)
 PCP - Lynwood Crandall, NP Cardiologist - Dr. [name] Swaziland - LOV 06/30/23  PPM/ICD - denies   Chest x-ray - 07/21/23 EKG - 06/30/23 Stress Test - 09/03/16 ECHO - 05/19/23 Cardiac Cath - 09/09/16   Sleep Study - denies  No DM  Last dose of GLP1 agonist-  n/a GLP1 instructions:  n/a  Blood Thinner Instructions: Last dose of Plavix  will be on 8/1 - per cardiology Aspirin  Instructions: patient's wife is calling Dr. Cristi office to clarify Aspirin  instructions.  Patient has not stopped his Aspirin  at this time.  Patient was made aware that per Dr. Cristi instructions said to hold Aspirin  for 10 days prior to surgery.  This nurse also left a message with Dr. Cristi office.    ERAS Protcol - clears until 0430 PRE-SURGERY Ensure or G2-  Ensure as ordered  COVID TEST-  n/a   Anesthesia review: yes - cardiac history  Patient denies shortness of breath, fever, cough and chest pain at PAT appointment   All instructions explained to the patient, with a verbal understanding of the material. Patient agrees to go over the instructions while at home for a better understanding. Patient also instructed to self quarantine after being tested for COVID-19. The opportunity to ask questions was provided.

## 2023-08-13 NOTE — H&P (View-Only) (Signed)
 Anesthesia Chart Review:  Case: [id number] Date/Time: 08/19/23 0715   Procedure: LAMINECTOMY, SPINE, LUMBAR, WITH EXTRADURAL LESION EXCISION - Central Laminectomy L4-5, excision of synovial cyst   Anesthesia type: General   Diagnosis: Spinal stenosis of lumbar region, unspecified whether neurogenic claudication present [M48.061]   Pre-op diagnosis: Stenosis L4-5   [facility]: [facility] ROOM 18 / [facility]   Surgeons: Duwayne Purchase, MD       DISCUSSION: Patient is an 83 year old male scheduled for the above procedure.  History includes never smoker, HTN, HLD, CAD (NSTEMI s/p Liberte stent prox-mid LCX 03/03/07; s/p DES RCA, DES oOM1, DES dist LCX/OM2 09/09/16 for angina), CVA/ (10/2016), exertional dyspnea, GERD, spinal surgery.  He had cardiology evaluation by Daneen Perkins, NP on 06/30/23. He had been overall stable from a cardiac standpoint.  He reported stable mild DOE.  He denied chest pain and palpitations.  He was euvolemic on exam.  Myoview  in 2023 was negative for ischemia. 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  Consider follow-up TTE versus CT in May 2026 to reevaluate thoracic aorta.  In regards to preoperative risk assessment she wrote, According to the Revised Cardiac Risk Index (RCRI), his Perioperative Risk of Major Cardiac Event is (%): 6.6. His Functional Capacity in METs is: 3.63 according to the Duke Activity Status Index (DASI). Reviewed with Dr. [place], who does not recommend any additional cardiac testing at this time.  Recent myoview , echocardiogram reassuring.  Per Dr. [place], he is at acceptable risk to proceed with surgery. He may hold Plavix  for 5 days prior to procedure.  Per Dr. [place], he should continue aspirin  throughout the perioperative period.  He had PCP preoperative evaluation on 07/20/23. Wendee Lynwood HERO, NP wrote,  Has been cleared by cardiology. Patient has acceptable risk for procedure. If blood work and xray come back normal the plan is to clear the patient for procedure.  07/21/23 CXR showed stable LLL nodule, no acute findings. BNP, H/H normal then. PLT 129K, but consistent with prior trends. BMP on 08/12/23 showed stable Cr at 1.23, glucose 102. SABRA     He reported last Plavix  plan for 08/13/2023.  Dr. Duwayne asked him to hold aspirin  for 10 days prior to surgery.  Anesthesia team to evaluate on the day of surgery.   VS: BP (!) 163/91   Pulse 62   Temp 36.9 C   Resp 18   Ht 6' (1.829 m)   Wt 95.4 kg   SpO2 94%   BMI 28.54 kg/m   PROVIDERS: Wendee Lynwood HERO, NP is PCP [place], [name], MD is cardiologist   LABS: Labs reviewed: Acceptable for surgery. LFTs normal 04/22/23.  (all labs ordered are listed, but only abnormal results are displayed)  Labs Reviewed  BASIC METABOLIC PANEL WITH GFR - Abnormal; Notable for the following components:      Result Value   Sodium 134 (*)    Glucose, Bld 102 (*)    GFR, Estimated 58 (*)    All other components within normal limits  SURGICAL PCR SCREEN   Lab Results  Component Value Date   WBC 6.5 07/21/2023   HGB 13.2 07/21/2023   HCT 39.2 07/21/2023   MCV 90.9 07/21/2023   PLT 129.0 (L) 07/21/2023   Pro BNP 55.0 07/21/23.    IMAGES: Xray L-spine 08/12/23: IMPRESSION: 1. Moderate multilevel degenerative disc disease  and facet hypertrophy throughout the lumbar spine. 2. Minor levo scoliotic curvature. 3. Suspected 6 non-rib-bearing lumbar vertebra. The lower most lumbar vertebra was labeled L5. Vertebral bodies were labeled.  CXR 07/21/23:  IMPRESSION: 1. No acute chest findings. 2.  Aortic Atherosclerosis (ICD10-I70.0). 3. Left lower lobe pulmonary nodule, stable from prior CTs.   EKG: 06/30/23: NSR   CV: Echo 05/19/23: IMPRESSIONS   1. Strain does not assess the basal septal and does not likely truly  reflect LV function. . Left ventricular  ejection fraction, by estimation,  is 55 to 60%. The left ventricle has normal function. The left ventricle  has no regional wall motion  abnormalities. Left ventricular diastolic parameters are indeterminate.  The average left ventricular global longitudinal strain is -16.6 %.   2. Right ventricular systolic function is normal. The right ventricular  size is normal. There is normal pulmonary artery systolic pressure.   3. Left atrial size was mildly dilated.   4. The mitral valve is degenerative. No evidence of mitral valve  regurgitation.   5. The aortic valve is calcified. Aortic valve regurgitation is not  visualized. Aortic valve sclerosis is present, with no evidence of aortic  valve stenosis.   6. Aortic dilatation noted. There is mild dilatation of the ascending  aorta, measuring 42 mm.   7. The inferior vena cava is normal in size with greater than 50%  respiratory variability, suggesting right atrial pressure of 3 mmHg.  - Comparison(s): No significant change from prior study.    Nuclear stress test 12/24/21:   The study is normal. The study is low risk.   No ST deviation was noted.   LV perfusion is normal. There is no evidence of ischemia. There is no evidence of infarction.   Left ventricular function is normal. Nuclear stress EF: 54 %. The left ventricular ejection fraction is mildly decreased (45-54%). End diastolic cavity size is normal. End systolic cavity size is normal.   Prior study available for comparison from 09/03/2016.   US  Carotid 10/12/16: Summary:  - The vertebral arteries appear patent with antegrade    flow.Incidental multiple cysts noted in right lobe of thyroid     gland  - Findings consistent with a 1- 39 percent stenosis involving the    right internal carotid artery and the left internal carotid    artery.    Cardiac cath 09/09/16: Mid LAD to Dist LAD lesion, 30 %stenosed. Prox Cx lesion, 25 %stenosed. Mid RCA lesion, 95 %stenosed. A STENT  SIERRA 3.50 X 23 MM drug eluting stent was successfully placed. Post intervention, there is a 0% residual stenosis. Mid Cx lesion, 70 %stenosed. A STENT SIERRA 2.75 X 12 MM drug eluting stent was successfully placed. Post intervention, there is a 0% residual stenosis. Ost 1st Mrg lesion, 90 %stenosed. A STENT SIERRA 3.00 X 12 MM drug eluting stent was successfully placed. Post intervention, there is a 0% residual stenosis.   1. Severe 2 vessel obstructive CAD    - 90% ostial OM1    - 70% distal LCx/OM2    - 95% mid RCA 2. Successful stenting of the mid RCA with DES 3. Successful stenting of the ostium of OM1 with DES 4. Successful stenting of the distal LCx/OM2   Plan: DAPT for at least one year. Anticipate DC in am.   Past Medical History:  Diagnosis Date   CAD (coronary artery disease), native coronary artery    09/09/16 PCI/DES x3 to mRCA, OM1 and dLcx/OM2.  Coronary disease    Status post stenting of the left circumflex coronary in 2009 with a bare-metal stent (with a 3.5x79mm Liberte stent)   Dyslipidemia    Dyspnea on exertion    Exertional chest pain    Exposure to TB    GERD (gastroesophageal reflux disease)    Hyperlipidemia    Hypertension    Hypertriglyceridemia    NSTEMI (non-ST elevated myocardial infarction) (HCC) 2009   BMS CFX   Numbness and tingling of left side of face    with left lip/face droop (Concern for Bell's Palsy)   Pneumonia    x3   Rash and nonspecific skin eruption    Lower right anterior leg   Stroke (HCC)    TIA (transient ischemic attack)    history of tia   Urinary tract infection     Past Surgical History:  Procedure Laterality Date   BACK SURGERY     bone spur removal   CARDIOVASCULAR STRESS TEST  10/03/2008   EF 59%   CORONARY ANGIOPLASTY WITH STENT PLACEMENT  09/09/2016   CORONARY STENT INTERVENTION N/A 09/09/2016   Procedure: CORONARY STENT INTERVENTION;  Surgeon: Swaziland, [name] M, MD;  [facility];   Service: Cardiovascular;  Laterality: N/A;   LEFT HEART CATH AND CORONARY ANGIOGRAPHY N/A 09/09/2016   Procedure: LEFT HEART CATH AND CORONARY ANGIOGRAPHY;  Surgeon: Swaziland, [name] M, MD;  Location: [facility] INVASIVE CV LAB;  Service: Cardiovascular;  Laterality: N/A;   US  ECHOCARDIOGRAPHY  09/21/2008   EF 55-60%    MEDICATIONS:  acetaminophen  (TYLENOL ) 500 MG tablet   aspirin  EC 81 MG tablet   clopidogrel  (PLAVIX ) 75 MG tablet   Evolocumab  (REPATHA  SURECLICK) 140 MG/ML SOAJ   ezetimibe  (ZETIA ) 10 MG tablet   fenofibrate  160 MG tablet   finasteride (PROSCAR) 5 MG tablet   nebivolol  (BYSTOLIC ) 10 MG tablet   nitroGLYCERIN  (NITROSTAT ) 0.4 MG SL tablet   pantoprazole  (PROTONIX ) 40 MG tablet   tamsulosin  (FLOMAX ) 0.4 MG CAPS capsule   No current facility-administered medications for this encounter.    Isaiah Ruder, PA-C Surgical Short Stay/Anesthesiology [facility] Phone [phone] [facility] Phone [phone] 08/13/2023 5:38 PM

## 2023-08-13 NOTE — Progress Notes (Signed)
 Anesthesia Chart Review:  Case: [id number] Date/Time: 08/19/23 0715   Procedure: LAMINECTOMY, SPINE, LUMBAR, WITH EXTRADURAL LESION EXCISION - Central Laminectomy L4-5, excision of synovial cyst   Anesthesia type: General   Diagnosis: Spinal stenosis of lumbar region, unspecified whether neurogenic claudication present [M48.061]   Pre-op diagnosis: Stenosis L4-5   [facility] ROOM [facility] / [facility]   Surgeons: Duwayne Purchase, MD       DISCUSSION: Patient is an 83 year old male scheduled for the above procedure.  History includes never smoker, HTN, HLD, CAD (NSTEMI s/p Liberte stent prox-mid LCX 03/03/07; s/p DES RCA, DES oOM1, DES dist LCX/OM2 09/09/16 for angina), CVA/ (10/2016), exertional dyspnea, GERD, spinal surgery.  He had cardiology evaluation by Daneen Perkins, NP on 06/30/23. He had been overall stable from a cardiac standpoint.  He reported stable mild DOE.  He denied chest pain and palpitations.  He was euvolemic on exam.  Myoview  in 2023 was negative for ischemia. 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  Consider follow-up TTE versus CT in May 2026 to reevaluate thoracic aorta.  In regards to preoperative risk assessment she wrote, According to the Revised Cardiac Risk Index (RCRI), his Perioperative Risk of Major Cardiac Event is (%): 6.6. His Functional Capacity in METs is: 3.63 according to the Duke Activity Status Index (DASI). Reviewed with Dr. [place], who does not recommend any additional cardiac testing at this time.  Recent myoview , echocardiogram reassuring.  Per Dr. [place], he is at acceptable risk to proceed with surgery. He may hold Plavix  for 5 days prior to procedure.  Per Dr. [place], he should continue aspirin  throughout the perioperative period.  He had PCP preoperative evaluation on 07/20/23. Wendee Lynwood HERO, NP wrote,  Has been cleared by cardiology. Patient has acceptable risk for procedure. If blood work and xray come back normal the plan is to clear the patient for procedure.  07/21/23 CXR showed stable LLL nodule, no acute findings. BNP, H/H normal then. PLT 129K, but consistent with prior trends. BMP on 08/12/23 showed stable Cr at 1.23, glucose 102. SABRA     He reported last Plavix  plan for 08/13/2023.  Dr. Duwayne asked him to hold aspirin  for 10 days prior to surgery.  Anesthesia team to evaluate on the day of surgery.   VS: BP (!) 163/91   Pulse 62   Temp 36.9 C   Resp 18   Ht 6' (1.829 m)   Wt 95.4 kg   SpO2 94%   BMI 28.54 kg/m   PROVIDERS: Wendee Lynwood HERO, NP is PCP [place], [name], MD is cardiologist   LABS: Labs reviewed: Acceptable for surgery. LFTs normal 04/22/23.  (all labs ordered are listed, but only abnormal results are displayed)  Labs Reviewed  BASIC METABOLIC PANEL WITH GFR - Abnormal; Notable for the following components:      Result Value   Sodium 134 (*)    Glucose, Bld 102 (*)    GFR, Estimated 58 (*)    All other components within normal limits  SURGICAL PCR SCREEN   Lab Results  Component Value Date   WBC 6.5 07/21/2023   HGB 13.2 07/21/2023   HCT 39.2 07/21/2023   MCV 90.9 07/21/2023   PLT 129.0 (L) 07/21/2023   Pro BNP 55.0 07/21/23.    IMAGES: Xray L-spine 08/12/23: IMPRESSION: 1. Moderate multilevel degenerative disc disease  and facet hypertrophy throughout the lumbar spine. 2. Minor levo scoliotic curvature. 3. Suspected 6 non-rib-bearing lumbar vertebra. The lower most lumbar vertebra was labeled L5. Vertebral bodies were labeled.  CXR 07/21/23:  IMPRESSION: 1. No acute chest findings. 2.  Aortic Atherosclerosis (ICD10-I70.0). 3. Left lower lobe pulmonary nodule, stable from prior CTs.   EKG: 06/30/23: NSR   CV: Echo 05/19/23: IMPRESSIONS   1. Strain does not assess the basal septal and does not likely truly  reflect LV function. . Left ventricular  ejection fraction, by estimation,  is 55 to 60%. The left ventricle has normal function. The left ventricle  has no regional wall motion  abnormalities. Left ventricular diastolic parameters are indeterminate.  The average left ventricular global longitudinal strain is -16.6 %.   2. Right ventricular systolic function is normal. The right ventricular  size is normal. There is normal pulmonary artery systolic pressure.   3. Left atrial size was mildly dilated.   4. The mitral valve is degenerative. No evidence of mitral valve  regurgitation.   5. The aortic valve is calcified. Aortic valve regurgitation is not  visualized. Aortic valve sclerosis is present, with no evidence of aortic  valve stenosis.   6. Aortic dilatation noted. There is mild dilatation of the ascending  aorta, measuring 42 mm.   7. The inferior vena cava is normal in size with greater than 50%  respiratory variability, suggesting right atrial pressure of 3 mmHg.  - Comparison(s): No significant change from prior study.    Nuclear stress test 12/24/21:   The study is normal. The study is low risk.   No ST deviation was noted.   LV perfusion is normal. There is no evidence of ischemia. There is no evidence of infarction.   Left ventricular function is normal. Nuclear stress EF: 54 %. The left ventricular ejection fraction is mildly decreased (45-54%). End diastolic cavity size is normal. End systolic cavity size is normal.   Prior study available for comparison from 09/03/2016.   US  Carotid 10/12/16: Summary:  - The vertebral arteries appear patent with antegrade    flow.Incidental multiple cysts noted in right lobe of thyroid     gland  - Findings consistent with a 1- 39 percent stenosis involving the    right internal carotid artery and the left internal carotid    artery.    Cardiac cath 09/09/16: Mid LAD to Dist LAD lesion, 30 %stenosed. Prox Cx lesion, 25 %stenosed. Mid RCA lesion, 95 %stenosed. A STENT  SIERRA 3.50 X 23 MM drug eluting stent was successfully placed. Post intervention, there is a 0% residual stenosis. Mid Cx lesion, 70 %stenosed. A STENT SIERRA 2.75 X 12 MM drug eluting stent was successfully placed. Post intervention, there is a 0% residual stenosis. Ost 1st Mrg lesion, 90 %stenosed. A STENT SIERRA 3.00 X 12 MM drug eluting stent was successfully placed. Post intervention, there is a 0% residual stenosis.   1. Severe 2 vessel obstructive CAD    - 90% ostial OM1    - 70% distal LCx/OM2    - 95% mid RCA 2. Successful stenting of the mid RCA with DES 3. Successful stenting of the ostium of OM1 with DES 4. Successful stenting of the distal LCx/OM2   Plan: DAPT for at least one year. Anticipate DC in am.   Past Medical History:  Diagnosis Date   CAD (coronary artery disease), native coronary artery    09/09/16 PCI/DES x3 to mRCA, OM1 and dLcx/OM2.  Coronary disease    Status post stenting of the left circumflex coronary in 2009 with a bare-metal stent (with a 3.5x79mm Liberte stent)   Dyslipidemia    Dyspnea on exertion    Exertional chest pain    Exposure to TB    GERD (gastroesophageal reflux disease)    Hyperlipidemia    Hypertension    Hypertriglyceridemia    NSTEMI (non-ST elevated myocardial infarction) (HCC) 2009   BMS CFX   Numbness and tingling of left side of face    with left lip/face droop (Concern for Bell's Palsy)   Pneumonia    x3   Rash and nonspecific skin eruption    Lower right anterior leg   Stroke (HCC)    TIA (transient ischemic attack)    history of tia   Urinary tract infection     Past Surgical History:  Procedure Laterality Date   BACK SURGERY     bone spur removal   CARDIOVASCULAR STRESS TEST  10/03/2008   EF 59%   CORONARY ANGIOPLASTY WITH STENT PLACEMENT  09/09/2016   CORONARY STENT INTERVENTION N/A 09/09/2016   Procedure: CORONARY STENT INTERVENTION;  Surgeon: Swazil[place], [name] M, MD;  Location: [facility] INVAS[facility] CV LAB;   Service: Cardiovascular;  Laterality: N/A;   LEFT HEART CATH AND CORONARY ANGIOGRAPHY N/A 09/09/2016   Procedure: LEFT HEART CATH AND CORONARY ANGIOGRAPHY;  Surgeon: [place]il[place], [name] M, MD;  Location: [facility] INVASIVE CV LAB;  Service: Cardiovascular;  Laterality: N/A;   US  ECHOCARDIOGRAPHY  09/21/2008   EF 55-60%    MEDICATIONS:  acetaminophen  (TYLENOL ) 500 MG tablet   aspirin  EC 81 MG tablet   clopidogrel  (PLAVIX ) 75 MG tablet   Evolocumab  (REPATHA  SURECLICK) 140 MG/ML SOAJ   ezetimibe  (ZETIA ) 10 MG tablet   fenofibrate  160 MG tablet   finasteride (PROSCAR) 5 MG tablet   nebivolol  (BYSTOLIC ) 10 MG tablet   nitroGLYCERIN  (NITROSTAT ) 0.4 MG SL tablet   pantoprazole  (PROTONIX ) 40 MG tablet   tamsulosin  (FLOMAX ) 0.4 MG CAPS capsule   No current facility-administered medications for this encounter.    Isaiah Ruder, PA-C Surgical Short Stay/Anesthesiology [facility] Phone [phone] [facility] Phone [phone] 08/13/2023 5:38 PM

## 2023-08-13 NOTE — Anesthesia Preprocedure Evaluation (Addendum)
 Anesthesia Evaluation  Patient identified by MRN, date of birth, ID band Patient awake    Reviewed: Allergy & Precautions, NPO status , Patient's Chart, lab work & pertinent test results, reviewed documented beta blocker date and time   History of Anesthesia Complications Negative for: history of anesthetic complications  Airway Mallampati: I  TM Distance: >3 FB Neck ROM: Full    Dental  (+) Edentulous Upper, Edentulous Lower   Pulmonary neg pulmonary ROS   breath sounds clear to auscultation       Cardiovascular hypertension, Pt. on medications and Pt. on home beta blockers (-) angina + CAD, + Past MI, + Cardiac Stents and + DOE   Rhythm:Regular Rate:Normal  05/2023 ECHO: EF 55-60%, normal LVF, normal RVF, no significant valvular abnormalities   Neuro/Psych TIACVA    GI/Hepatic Neg liver ROS,GERD  Medicated and Controlled,,  Endo/Other  BMI 28.5 Repatha : 8d ago  Renal/GU negative Renal ROS     Musculoskeletal   Abdominal   Peds  Hematology plavix    Anesthesia Other Findings   Reproductive/Obstetrics                              Anesthesia Physical Anesthesia Plan  ASA: 3  Anesthesia Plan: General   Post-op Pain Management: Tylenol  PO (pre-op)*   Induction: Intravenous  PONV Risk Score and Plan: 2 and Dexamethasone   Airway Management Planned: Oral ETT  Additional Equipment: None  Intra-op Plan:   Post-operative Plan: Extubation in OR  Informed Consent: I have reviewed the patients History and Physical, chart, labs and discussed the procedure including the risks, benefits and alternatives for the proposed anesthesia with the patient or authorized representative who has indicated his/her understanding and acceptance.     Dental advisory given  Plan Discussed with: CRNA and Surgeon  Anesthesia Plan Comments: (PAT note written 08/13/2023 by Allison Zelenak, PA-C.  )          Anesthesia Quick Evaluation

## 2023-08-19 ENCOUNTER — Encounter (HOSPITAL_COMMUNITY): Payer: Self-pay | Admitting: Specialist

## 2023-08-19 ENCOUNTER — Ambulatory Visit (HOSPITAL_COMMUNITY): Admitting: Anesthesiology

## 2023-08-19 ENCOUNTER — Other Ambulatory Visit: Payer: Self-pay

## 2023-08-19 ENCOUNTER — Ambulatory Visit (HOSPITAL_COMMUNITY)
Admission: RE | Admit: 2023-08-19 | Discharge: 2023-08-20 | Disposition: A | Discharge status: Home / Self Care | Attending: Specialist | Admitting: Specialist

## 2023-08-19 ENCOUNTER — Ambulatory Visit (HOSPITAL_COMMUNITY)

## 2023-08-19 ENCOUNTER — Encounter (HOSPITAL_COMMUNITY)
Admission: RE | Disposition: A | Discharge status: Home / Self Care | Payer: Self-pay | Source: Home / Self Care | Attending: Specialist

## 2023-08-19 ENCOUNTER — Ambulatory Visit (HOSPITAL_COMMUNITY): Payer: Self-pay | Admitting: Vascular Surgery

## 2023-08-19 DIAGNOSIS — M48061 Spinal stenosis, lumbar region without neurogenic claudication: Secondary | ICD-10-CM | POA: Diagnosis present

## 2023-08-19 DIAGNOSIS — M4186 Other forms of scoliosis, lumbar region: Secondary | ICD-10-CM | POA: Diagnosis not present

## 2023-08-19 DIAGNOSIS — M4156 Other secondary scoliosis, lumbar region: Secondary | ICD-10-CM | POA: Diagnosis not present

## 2023-08-19 DIAGNOSIS — K219 Gastro-esophageal reflux disease without esophagitis: Secondary | ICD-10-CM | POA: Diagnosis not present

## 2023-08-19 DIAGNOSIS — M419 Scoliosis, unspecified: Secondary | ICD-10-CM | POA: Diagnosis not present

## 2023-08-19 DIAGNOSIS — Z7902 Long term (current) use of antithrombotics/antiplatelets: Secondary | ICD-10-CM | POA: Diagnosis not present

## 2023-08-19 DIAGNOSIS — I10 Essential (primary) hypertension: Secondary | ICD-10-CM | POA: Insufficient documentation

## 2023-08-19 DIAGNOSIS — Z8673 Personal history of transient ischemic attack (TIA), and cerebral infarction without residual deficits: Secondary | ICD-10-CM | POA: Diagnosis not present

## 2023-08-19 DIAGNOSIS — I2510 Atherosclerotic heart disease of native coronary artery without angina pectoris: Secondary | ICD-10-CM | POA: Diagnosis not present

## 2023-08-19 DIAGNOSIS — M7138 Other bursal cyst, other site: Secondary | ICD-10-CM | POA: Insufficient documentation

## 2023-08-19 DIAGNOSIS — Z0189 Encounter for other specified special examinations: Secondary | ICD-10-CM | POA: Diagnosis not present

## 2023-08-19 DIAGNOSIS — M48062 Spinal stenosis, lumbar region with neurogenic claudication: Secondary | ICD-10-CM | POA: Diagnosis not present

## 2023-08-19 DIAGNOSIS — I252 Old myocardial infarction: Secondary | ICD-10-CM | POA: Insufficient documentation

## 2023-08-19 DIAGNOSIS — Z79899 Other long term (current) drug therapy: Secondary | ICD-10-CM | POA: Insufficient documentation

## 2023-08-19 DIAGNOSIS — Z7982 Long term (current) use of aspirin: Secondary | ICD-10-CM | POA: Diagnosis not present

## 2023-08-19 SURGERY — LAMINECTOMY, SPINE, LUMBAR, WITH EXTRADURAL LESION EXCISION
Anesthesia: General

## 2023-08-19 MED ORDER — EPHEDRINE SULFATE-NACL 50-0.9 MG/10ML-% IV SOSY
PREFILLED_SYRINGE | INTRAVENOUS | Status: DC | PRN
  Administered 2023-08-19 (×2): 10 mg via INTRAVENOUS
  Administered 2023-08-19: 5 mg via INTRAVENOUS

## 2023-08-19 MED ORDER — METHOCARBAMOL 1000 MG/10ML IJ SOLN: 500.0000 mg | Freq: Four times a day (QID) | INTRAMUSCULAR | Status: DC | PRN

## 2023-08-19 MED ORDER — BUPIVACAINE-EPINEPHRINE (PF) 0.5% -1:200000 IJ SOLN
INTRAMUSCULAR | Status: AC
  Filled 2023-08-19: qty 30

## 2023-08-19 MED ORDER — ORAL CARE MOUTH RINSE: 15.0000 mL | Freq: Once | OROMUCOSAL | Status: AC

## 2023-08-19 MED ORDER — MENTHOL 3 MG MT LOZG: 1.0000 | LOZENGE | OROMUCOSAL | Status: DC | PRN

## 2023-08-19 MED ORDER — PROPOFOL 10 MG/ML IV BOLUS
INTRAVENOUS | Status: DC | PRN
  Administered 2023-08-19: 120 mg via INTRAVENOUS

## 2023-08-19 MED ORDER — ACETAMINOPHEN 500 MG PO TABS: 1000.0000 mg | ORAL_TABLET | Freq: Once | ORAL | Status: AC

## 2023-08-19 MED ORDER — RISAQUAD PO CAPS
1.0000 | ORAL_CAPSULE | Freq: Every day | ORAL | Status: DC
  Administered 2023-08-19: 1 via ORAL
  Filled 2023-08-19: qty 1

## 2023-08-19 MED ORDER — EPHEDRINE 5 MG/ML INJ
INTRAVENOUS | Status: AC
  Filled 2023-08-19: qty 5

## 2023-08-19 MED ORDER — TAMSULOSIN HCL 0.4 MG PO CAPS: 0.4000 mg | ORAL_CAPSULE | Freq: Every day | ORAL | Status: DC

## 2023-08-19 MED ORDER — ALBUMIN HUMAN 5 % IV SOLN
INTRAVENOUS | Status: DC | PRN
Start: 2023-08-19 — End: 2023-08-19

## 2023-08-19 MED ORDER — HYDROMORPHONE HCL 1 MG/ML IJ SOLN: 0.2500 mg | INTRAMUSCULAR | Status: DC | PRN

## 2023-08-19 MED ORDER — VANCOMYCIN HCL 1500 MG/300ML IV SOLN
1500.0000 mg | INTRAVENOUS | Status: AC
  Administered 2023-08-19: 1500 mg via INTRAVENOUS
  Filled 2023-08-19: qty 300

## 2023-08-19 MED ORDER — DEXAMETHASONE SODIUM PHOSPHATE 10 MG/ML IJ SOLN
INTRAMUSCULAR | Status: DC | PRN
  Administered 2023-08-19: 5 mg via INTRAVENOUS

## 2023-08-19 MED ORDER — LIDOCAINE 2% (20 MG/ML) 5 ML SYRINGE
INTRAMUSCULAR | Status: AC
  Filled 2023-08-19: qty 5

## 2023-08-19 MED ORDER — SUGAMMADEX SODIUM 200 MG/2ML IV SOLN
INTRAVENOUS | Status: DC | PRN
  Administered 2023-08-19: 200 mg via INTRAVENOUS

## 2023-08-19 MED ORDER — EZETIMIBE 10 MG PO TABS: 10.0000 mg | ORAL_TABLET | Freq: Every day | ORAL | Status: DC

## 2023-08-19 MED ORDER — THROMBIN 20000 UNITS EX SOLR
CUTANEOUS | Status: DC | PRN
Start: 2023-08-19 — End: 2023-08-19
  Administered 2023-08-19: 20000 [IU] via TOPICAL

## 2023-08-19 MED ORDER — ACETAMINOPHEN 10 MG/ML IV SOLN
1000.0000 mg | INTRAVENOUS | Status: AC
  Administered 2023-08-19: 1000 mg via INTRAVENOUS
  Filled 2023-08-19: qty 100

## 2023-08-19 MED ORDER — ROCURONIUM BROMIDE 10 MG/ML (PF) SYRINGE
PREFILLED_SYRINGE | INTRAVENOUS | Status: DC | PRN
  Administered 2023-08-19 (×2): 10 mg via INTRAVENOUS
  Administered 2023-08-19: 60 mg via INTRAVENOUS

## 2023-08-19 MED ORDER — PANTOPRAZOLE SODIUM 40 MG PO TBEC: 40.0000 mg | DELAYED_RELEASE_TABLET | Freq: Every day | ORAL | Status: DC

## 2023-08-19 MED ORDER — TRANEXAMIC ACID-NACL 1000-0.7 MG/100ML-% IV SOLN
1000.0000 mg | INTRAVENOUS | Status: AC
  Administered 2023-08-19: 1000 mg via INTRAVENOUS
  Filled 2023-08-19: qty 100

## 2023-08-19 MED ORDER — CHLORHEXIDINE GLUCONATE 0.12 % MT SOLN
15.0000 mL | Freq: Once | OROMUCOSAL | Status: AC
  Administered 2023-08-19: 15 mL via OROMUCOSAL

## 2023-08-19 MED ORDER — BUPIVACAINE-EPINEPHRINE 0.5% -1:200000 IJ SOLN
INTRAMUSCULAR | Status: DC | PRN
  Administered 2023-08-19: 4 mL

## 2023-08-19 MED ORDER — NEBIVOLOL HCL 10 MG PO TABS
10.0000 mg | ORAL_TABLET | Freq: Every evening | ORAL | Status: DC
  Administered 2023-08-19: 10 mg via ORAL
  Filled 2023-08-19: qty 1

## 2023-08-19 MED ORDER — LACTATED RINGERS IV SOLN: INTRAVENOUS | Status: DC

## 2023-08-19 MED ORDER — ONDANSETRON HCL 4 MG/2ML IJ SOLN: 4.0000 mg | Freq: Four times a day (QID) | INTRAMUSCULAR | Status: DC | PRN

## 2023-08-19 MED ORDER — THROMBIN 20000 UNITS EX SOLR
CUTANEOUS | Status: AC
Start: 2023-08-19 — End: 2023-08-19
  Filled 2023-08-19: qty 20000

## 2023-08-19 MED ORDER — BISACODYL 5 MG PO TBEC
5.0000 mg | DELAYED_RELEASE_TABLET | Freq: Every day | ORAL | Status: DC | PRN
  Administered 2023-08-19: 5 mg via ORAL
  Filled 2023-08-19: qty 1

## 2023-08-19 MED ORDER — STERILE WATER FOR IRRIGATION IR SOLN
Status: DC | PRN
  Administered 2023-08-19: 1000 mL

## 2023-08-19 MED ORDER — ACETAMINOPHEN 650 MG RE SUPP: 650.0000 mg | RECTAL | Status: DC | PRN

## 2023-08-19 MED ORDER — DEXAMETHASONE SODIUM PHOSPHATE 10 MG/ML IJ SOLN
INTRAMUSCULAR | Status: AC
  Filled 2023-08-19: qty 1

## 2023-08-19 MED ORDER — FENOFIBRATE 160 MG PO TABS
160.0000 mg | ORAL_TABLET | Freq: Every day | ORAL | Status: DC
  Administered 2023-08-19: 160 mg via ORAL
  Filled 2023-08-19: qty 1

## 2023-08-19 MED ORDER — HYDROMORPHONE HCL 1 MG/ML IJ SOLN
0.5000 mg | INTRAMUSCULAR | Status: DC | PRN
  Administered 2023-08-19: 0.5 mg via INTRAVENOUS
  Filled 2023-08-19: qty 0.5

## 2023-08-19 MED ORDER — ACETAMINOPHEN 325 MG PO TABS: 650.0000 mg | ORAL_TABLET | ORAL | Status: DC | PRN

## 2023-08-19 MED ORDER — FENTANYL CITRATE (PF) 250 MCG/5ML IJ SOLN
INTRAMUSCULAR | Status: DC | PRN
  Administered 2023-08-19: 50 ug via INTRAVENOUS
  Administered 2023-08-19: 150 ug via INTRAVENOUS

## 2023-08-19 MED ORDER — PHENOL 1.4 % MT LIQD: 1.0000 | OROMUCOSAL | Status: DC | PRN

## 2023-08-19 MED ORDER — ONDANSETRON HCL 4 MG/2ML IJ SOLN
INTRAMUSCULAR | Status: DC | PRN
  Administered 2023-08-19: 4 mg via INTRAVENOUS

## 2023-08-19 MED ORDER — TRANEXAMIC ACID 1000 MG/10ML IV SOLN
INTRAVENOUS | Status: DC | PRN
  Administered 2023-08-19: 2000 mg via TOPICAL

## 2023-08-19 MED ORDER — OXYCODONE HCL 5 MG PO TABS
5.0000 mg | ORAL_TABLET | ORAL | Status: DC | PRN
  Administered 2023-08-20: 5 mg via ORAL
  Filled 2023-08-19: qty 1

## 2023-08-19 MED ORDER — ALUM & MAG HYDROXIDE-SIMETH 200-200-20 MG/5ML PO SUSP
30.0000 mL | Freq: Four times a day (QID) | ORAL | Status: DC | PRN
Start: 2023-08-19 — End: 2023-08-20

## 2023-08-19 MED ORDER — OXYCODONE HCL 5 MG PO TABS: 5.0000 mg | ORAL_TABLET | Freq: Once | ORAL | Status: DC | PRN

## 2023-08-19 MED ORDER — TRANEXAMIC ACID 1000 MG/10ML IV SOLN
2000.0000 mg | INTRAVENOUS | Status: DC
  Filled 2023-08-19: qty 20

## 2023-08-19 MED ORDER — PROPOFOL 10 MG/ML IV BOLUS
INTRAVENOUS | Status: AC
  Filled 2023-08-19: qty 20

## 2023-08-19 MED ORDER — KCL IN DEXTROSE-NACL 20-5-0.9 MEQ/L-%-% IV SOLN
INTRAVENOUS | Status: DC
  Filled 2023-08-19: qty 1000

## 2023-08-19 MED ORDER — MAGNESIUM CITRATE PO SOLN
1.0000 | Freq: Once | ORAL | Status: AC | PRN
  Administered 2023-08-20: 1 via ORAL
  Filled 2023-08-19: qty 296

## 2023-08-19 MED ORDER — SENNOSIDES-DOCUSATE SODIUM 8.6-50 MG PO TABS: 1.0000 | ORAL_TABLET | Freq: Two times a day (BID) | ORAL | Status: DC

## 2023-08-19 MED ORDER — 0.9 % SODIUM CHLORIDE (POUR BTL) OPTIME
TOPICAL | Status: DC | PRN
  Administered 2023-08-19: 1000 mL

## 2023-08-19 MED ORDER — METHOCARBAMOL 500 MG PO TABS
500.0000 mg | ORAL_TABLET | Freq: Four times a day (QID) | ORAL | Status: DC | PRN
  Administered 2023-08-19 – 2023-08-20 (×3): 500 mg via ORAL
  Filled 2023-08-19 (×3): qty 1

## 2023-08-19 MED ORDER — KCL IN DEXTROSE-NACL 20-5-0.45 MEQ/L-%-% IV SOLN
INTRAVENOUS | Status: DC
  Filled 2023-08-19: qty 1000

## 2023-08-19 MED ORDER — NITROGLYCERIN 0.4 MG SL SUBL: 0.4000 mg | SUBLINGUAL_TABLET | SUBLINGUAL | Status: DC | PRN

## 2023-08-19 MED ORDER — FINASTERIDE 5 MG PO TABS
5.0000 mg | ORAL_TABLET | Freq: Every evening | ORAL | Status: DC
  Administered 2023-08-19: 5 mg via ORAL
  Filled 2023-08-19: qty 1

## 2023-08-19 MED ORDER — POLYETHYLENE GLYCOL 3350 17 G PO PACK
17.0000 g | PACK | Freq: Every day | ORAL | Status: DC | PRN
Start: 2023-08-19 — End: 2023-08-20

## 2023-08-19 MED ORDER — OXYCODONE HCL 5 MG/5ML PO SOLN: 5.0000 mg | Freq: Once | ORAL | Status: DC | PRN

## 2023-08-19 MED ORDER — GENTAMICIN SULFATE 40 MG/ML IJ SOLN
2.0000 mg/kg | INTRAVENOUS | Status: AC
  Administered 2023-08-19: 190.8 mg via INTRAVENOUS
  Filled 2023-08-19: qty 4.75

## 2023-08-19 MED ORDER — ROCURONIUM BROMIDE 10 MG/ML (PF) SYRINGE
PREFILLED_SYRINGE | INTRAVENOUS | Status: AC
  Filled 2023-08-19: qty 10

## 2023-08-19 MED ORDER — ONDANSETRON HCL 4 MG PO TABS: 4.0000 mg | ORAL_TABLET | Freq: Four times a day (QID) | ORAL | Status: DC | PRN

## 2023-08-19 MED ORDER — LIDOCAINE 2% (20 MG/ML) 5 ML SYRINGE
INTRAMUSCULAR | Status: DC | PRN
  Administered 2023-08-19: 20 mg via INTRAVENOUS

## 2023-08-19 MED ORDER — ONDANSETRON HCL 4 MG/2ML IJ SOLN
INTRAMUSCULAR | Status: AC
  Filled 2023-08-19: qty 2

## 2023-08-19 MED ORDER — CHLORHEXIDINE GLUCONATE 0.12 % MT SOLN
15.0000 mL | Freq: Once | OROMUCOSAL | Status: AC
  Filled 2023-08-19: qty 15

## 2023-08-19 MED ORDER — PHENYLEPHRINE HCL-NACL 20-0.9 MG/250ML-% IV SOLN
INTRAVENOUS | Status: DC | PRN
  Administered 2023-08-19: 25 ug/min via INTRAVENOUS

## 2023-08-19 MED ORDER — VANCOMYCIN HCL IN DEXTROSE 1-5 GM/200ML-% IV SOLN
1000.0000 mg | Freq: Two times a day (BID) | INTRAVENOUS | Status: DC
  Administered 2023-08-19: 1000 mg via INTRAVENOUS
  Filled 2023-08-19: qty 200

## 2023-08-19 MED ORDER — THROMBIN 20000 UNITS EX SOLR: CUTANEOUS | Status: DC | PRN

## 2023-08-19 MED ORDER — MIDAZOLAM HCL 2 MG/2ML IJ SOLN: 0.5000 mg | Freq: Once | INTRAMUSCULAR | Status: DC | PRN

## 2023-08-19 MED ORDER — DOCUSATE SODIUM 100 MG PO CAPS
100.0000 mg | ORAL_CAPSULE | Freq: Two times a day (BID) | ORAL | Status: DC
  Administered 2023-08-19: 100 mg via ORAL
  Filled 2023-08-19: qty 1

## 2023-08-19 MED ORDER — FENTANYL CITRATE (PF) 250 MCG/5ML IJ SOLN
INTRAMUSCULAR | Status: AC
  Filled 2023-08-19: qty 5

## 2023-08-19 SURGICAL SUPPLY — 52 items
BAG COUNTER SPONGE SURGICOUNT (BAG) ×1 IMPLANT
BAG DECANTER FOR FLEXI CONT (MISCELLANEOUS) IMPLANT
BAND RUBBER #18 3X1/16 STRL (MISCELLANEOUS) ×2 IMPLANT
BUR EGG ELITE 5.0 (BURR) IMPLANT
BUR RND DIAMOND ELITE 4.0 (BURR) IMPLANT
CLEANER TIP ELECTROSURG 2X2 (MISCELLANEOUS) ×1 IMPLANT
CNTNR URN SCR LID CUP LEK RST (MISCELLANEOUS) ×1 IMPLANT
DRAPE LAPAROTOMY 100X72X124 (DRAPES) ×1 IMPLANT
DRAPE MICROSCOPE SLANT 54X150 (MISCELLANEOUS) ×1 IMPLANT
DRAPE SHEET LG 3/4 BI-LAMINATE (DRAPES) ×1 IMPLANT
DRAPE SURG 17X11 SM STRL (DRAPES) ×1 IMPLANT
DRAPE UTILITY XL STRL (DRAPES) ×1 IMPLANT
DRSG AQUACEL AG ADV 3.5X 4 (GAUZE/BANDAGES/DRESSINGS) IMPLANT
DRSG AQUACEL AG ADV 3.5X 6 (GAUZE/BANDAGES/DRESSINGS) IMPLANT
DRSG TEGADERM 4X4.75 (GAUZE/BANDAGES/DRESSINGS) IMPLANT
DRSG TELFA 3X8 NADH STRL (GAUZE/BANDAGES/DRESSINGS) IMPLANT
DURAPREP 26ML APPLICATOR (WOUND CARE) ×1 IMPLANT
DURASEAL SPINE SEALANT 3ML (MISCELLANEOUS) IMPLANT
ELECTRODE BLDE 4.0 EZ CLN MEGD (MISCELLANEOUS) IMPLANT
ELECTRODE REM PT RTRN 9FT ADLT (ELECTROSURGICAL) ×1 IMPLANT
EVACUATOR 1/8 PVC DRAIN (DRAIN) IMPLANT
GAUZE SPONGE 2X2 8PLY STRL LF (GAUZE/BANDAGES/DRESSINGS) IMPLANT
GLOVE BIOGEL PI IND STRL 7.5 (GLOVE) ×1 IMPLANT
GLOVE SURG SS PI 7.0 STRL IVOR (GLOVE) ×1 IMPLANT
GLOVE SURG SS PI 8.0 STRL IVOR (GLOVE) ×2 IMPLANT
GOWN STRL REUS W/ TWL LRG LVL3 (GOWN DISPOSABLE) ×1 IMPLANT
GOWN STRL REUS W/ TWL XL LVL3 (GOWN DISPOSABLE) ×1 IMPLANT
IV CATH 14GX2 1/4 (CATHETERS) ×1 IMPLANT
KIT BASIN OR (CUSTOM PROCEDURE TRAY) ×1 IMPLANT
NDL 22X1.5 STRL (OR ONLY) (MISCELLANEOUS) ×1 IMPLANT
NDL SPNL 18GX3.5 QUINCKE PK (NEEDLE) ×2 IMPLANT
NEEDLE 22X1.5 STRL (OR ONLY) (MISCELLANEOUS) ×1 IMPLANT
NEEDLE SPNL 18GX3.5 QUINCKE PK (NEEDLE) ×2 IMPLANT
PACK LAMINECTOMY NEURO (CUSTOM PROCEDURE TRAY) ×1 IMPLANT
PATTIES SURGICAL .75X.75 (GAUZE/BANDAGES/DRESSINGS) ×1 IMPLANT
SOLUTION PRONTOSAN WOUND 350ML (IRRIGATION / IRRIGATOR) IMPLANT
SPONGE SURGIFOAM ABS GEL 100 (HEMOSTASIS) ×1 IMPLANT
SPONGE T-LAP 4X18 ~~LOC~~+RFID (SPONGE) IMPLANT
STAPLER VISISTAT (STAPLE) IMPLANT
STRIP CLOSURE SKIN 1/2X4 (GAUZE/BANDAGES/DRESSINGS) ×1 IMPLANT
SUT NURALON 4 0 TR CR/8 (SUTURE) IMPLANT
SUT PROLENE 3 0 PS 2 (SUTURE) IMPLANT
SUT VIC AB 1 CT1 27XBRD ANTBC (SUTURE) IMPLANT
SUT VIC AB 1-0 CT2 27 (SUTURE) IMPLANT
SUT VIC AB 2-0 CT1 TAPERPNT 27 (SUTURE) IMPLANT
SUT VIC AB 2-0 CT2 27 (SUTURE) IMPLANT
SYR 3ML LL SCALE MARK (SYRINGE) ×1 IMPLANT
TOWEL GREEN STERILE (TOWEL DISPOSABLE) ×1 IMPLANT
TOWEL GREEN STERILE FF (TOWEL DISPOSABLE) ×1 IMPLANT
TRAY FOLEY MTR SLVR 16FR STAT (SET/KITS/TRAYS/PACK) ×1 IMPLANT
WIPE CHG 2% 2PK PREOPERATIVE (MISCELLANEOUS) ×1 IMPLANT
YANKAUER SUCT BULB TIP NO VENT (SUCTIONS) ×1 IMPLANT

## 2023-08-19 NOTE — Anesthesia Postprocedure Evaluation (Signed)
 Anesthesia Post Note  Patient: Colin Lopez  Procedure(s) Performed: LAMINECTOMY, SPINE, LUMBAR, WITH EXTRADURAL LESION EXCISION     Patient location during evaluation: PACU Anesthesia Type: General Level of consciousness: sedated, oriented and patient cooperative Pain management: pain level controlled Vital Signs Assessment: post-procedure vital signs reviewed and stable Respiratory status: spontaneous breathing, nonlabored ventilation and respiratory function stable Cardiovascular status: blood pressure returned to baseline and stable Postop Assessment: no apparent nausea or vomiting Anesthetic complications: no   No notable events documented.  Last Vitals:  Vitals:   08/19/23 1127 08/19/23 1157  BP: 109/66 104/74  Pulse: (!) 59 64  Resp: 14 18  Temp: 36.6 C 36.6 C  SpO2: 94% 94%                  Declan Mier,E. Zorianna Taliaferro

## 2023-08-19 NOTE — Discharge Instructions (Signed)

## 2023-08-19 NOTE — Transfer of Care (Signed)
 Immediate Anesthesia Transfer of Care Note  Patient: Colin Lopez Mania  Procedure(s) Performed: LAMINECTOMY, SPINE, LUMBAR, WITH EXTRADURAL LESION EXCISION  Patient Location: PACU  Anesthesia Type:General  Level of Consciousness: awake, patient cooperative, and responds to stimulation  Airway & Oxygen Therapy: Patient Spontanous Breathing and Patient connected to face mask oxygen  Post-op Assessment: Report given to RN, Post -op Vital signs reviewed and stable, and Patient moving all extremities X 4  Post vital signs: Reviewed and stable  Last Vitals:  Vitals Value Taken Time  BP 101/52 08/19/23 10:37  Temp    Pulse 67 08/19/23 10:42  Resp 16 08/19/23 10:42  SpO2 95 % 08/19/23 10:42  Vitals shown include unfiled device data.  Last Pain:  Vitals:   08/19/23 0609  TempSrc:   PainSc: 0-No pain         Complications: No notable events documented.

## 2023-08-19 NOTE — Plan of Care (Signed)

## 2023-08-19 NOTE — Brief Op Note (Signed)
 08/19/2023  7:15 AM  PATIENT:  Colin Lopez  83 y.o. male  PRE-OPERATIVE DIAGNOSIS:  Stenosis L4-5  POST-OPERATIVE DIAGNOSIS:  * No post-op diagnosis entered *  PROCEDURE:  Procedure(s) with comments: LAMINECTOMY, SPINE, LUMBAR, WITH EXTRADURAL LESION EXCISION (N/A) - Central Laminectomy L4-5, excision of synovial cyst  SURGEON:  Surgeons and Role:    DEWAINE Duwayne Purchase, MD - Primary  PHYSICIAN ASSISTANT:   ASSISTANTS: Bissell   ANESTHESIA:   general  EBL:  200   BLOOD ADMINISTERED:none  DRAINS: none   LOCAL MEDICATIONS USED:  MARCAINE      SPECIMEN:  No Specimen  DISPOSITION OF SPECIMEN:  N/A  COUNTS:  YES  TOURNIQUET:  * No tourniquets in log *  DICTATION: .Other Dictation: Dictation Number  [id number]  PLAN OF CARE: Admit for overnight observation  PATIENT DISPOSITION:  PACU - hemodynamically stable.   Delay start of Pharmacological VTE agent (>24hrs) due to surgical blood loss or risk of bleeding: yes

## 2023-08-19 NOTE — Anesthesia Procedure Notes (Signed)
 Procedure Name: Intubation Date/Time: 08/19/2023 7:39 AM  Performed by: Jolynn Mage, CRNAPre-anesthesia Checklist: Patient identified, Patient being monitored, Timeout performed, Emergency Drugs available and Suction available Patient Re-evaluated:Patient Re-evaluated prior to induction Oxygen Delivery Method: Circle System Utilized Preoxygenation: Pre-oxygenation with 100% oxygen Induction Type: IV induction Ventilation: Mask ventilation without difficulty Laryngoscope Size: Miller and 2 Grade View: Grade I Tube type: Oral Tube size: 8.0 mm Number of attempts: 1 Airway Equipment and Method: Stylet Placement Confirmation: ETT inserted through vocal cords under direct vision, positive ETCO2 and breath sounds checked- equal and bilateral Secured at: 22 cm Tube secured with: Tape Dental Injury: Teeth and Oropharynx as per pre-operative assessment

## 2023-08-19 NOTE — Op Note (Unsigned)
 NAME: Colin Lopez, Colin Lopez MEDICAL RECORD NO: [id number] ACCOUNT NO: [id number] DATE OF BIRTH: 05-Mar-1940 FACILITY: [facility] LOCATION: [facility] PHYSICIAN: Reyes KYM Billing, MD  Operative Report   DATE OF PROCEDURE: 08/19/2023  PREOPERATIVE DIAGNOSES: 1.  Spinal stenosis L4-L5. 2.  Synovial cyst L4-L5 facet, right. 3.  Scoliosis.  POSTOPERATIVE DIAGNOSES: 1.  Spinal stenosis L4-L5. 2.  Synovial cyst L4-L5 facet, right. 3.  Scoliosis.  PROCEDURE PERFORMED: 1.  Central laminectomy of L4-L5. 2.  Foraminotomies, L4-L5. 3.  Excision of synovial cyst L4-L5 facet right.  ANESTHESIA:  General.  ASSISTANT:  Jaqueline Bissell, PA.  HISTORY:  An 83 year old with neurogenic claudication secondary to severe spinal stenosis, facet arthrosis, ligamentum flavum hypertrophy, and synovial cyst on the right indicated for decompression.  Risks and benefits discussed including bleeding,  infection, damage to neurovascular structures, no change in symptoms, worsening symptoms, DVT, PE, and anesthetic complications, etc.  DESCRIPTION OF PROCEDURE:  The patient in the supine position and after induction of adequate general anesthesia, vancomycin  and gentamicin  placed prone on a Wilson frame.  All bony prominences were well padded.  The lumbar region was prepped and draped  in the usual sterile fashion.  Two 18-gauge spinal needles were utilized to localize the L4-L5 interspace.  The patient had a transitional segment that we considered S1 and S2.  The first open disc space was L5-S1.  The second from the bottom open disc  space was L4-L5.  The radiologist here had numbered them differently with a transitional segment at L5-S1.  Dorsal lumbar fascia was divided in line with the skin incision.  Paraspinous muscle was elevated from the lamina L4-L5.  A McCulloch retractor  was placed.  The operating microscope was draped and brought on the surgical field.  Contributory radiographs were obtained confirming the L4-L5  space.  Leksell was utilized to remove the spinous processes of L4 and L5.  Hemilaminotomy at the caudad edge  of L4 was performed.  The patient had a severely hypertrophic facet on the right at L4-L5.  A high-speed burr was used to debulk the lamina at L4 and the facet at L4-L5 on the right.  I then used a 2 and 3 mm Kerrison to perform hemilaminotomies of the  caudad edge of L4 cephalad to the point of detaching the ligamentum flavum, preserving the pars.  Next, I used a curette to detach the ligamentum flavum from the cephalad edge of L5.  Scoliosis was noted as well.  I then began removing ligamentum flavum  from the interspace, first centrally using a Woodson and a neural patty to protect the neural elements.  I removed the ligamentum flavum centrally and then laterally.  First on the left, developing a plane beneath the thecal sac and the ligamentum flavum  hypertrophy and the facet hypertrophy, decompressing the lateral recess to the medial border of the pedicle, performing a foraminotomy of L5 and L4.  Bipolar electrocautery was utilized to achieve hemostasis.  The epidural venous plexus was cauterized.   A Woodson probe passed freely out the foramen of L4 and L5.  On the right, due to the hypertrophic facet and the synovial cyst, I identified a plane between the facet and the thecal sac.  A synovial cyst was noted within the facet at L4-L5 extending  cephalad.  I performed a partial medial hemifacetectomy utilizing a 2 mm Kerrison to performing a foraminotomy of L5 and L4, decompressing the lateral recess of the medial border of the pedicle excising a small synovial cyst.  No disc herniation was  noted on either side.  Bipolar electrocautery was utilized to achieve hemostasis.  A Woodson probe passed freely out the foramen of L4 and L5 following that.  Again, the patient had a severely hypertrophic facet on the right at L4-L5.  We used a curette  and high-speed burr to debulk this as well.  Ample  pars was noted.  Again, he had the scoliosis, which rotated the facets at this level.  He had mild bleeding throughout the case.  We used electrocautery, bipolar electrocautery, thrombin -soaked Gelfoam,  and topical TXA, which we had applied and then evacuated and irrigated.  Confirmatory radiographs were obtained.  Woodson's was in the foramen of L4 and L5.  Bone wax was placed on the cancellous surfaces.  No evidence of CSF leakage or active bleeding.   Good restoration of the thecal sac was noted.  We meticulously removed the [facility] retractor, meticulously achieved hemostasis, irrigated, and achieved hemostasis once again.  No active bleeding.  I did place a Hemovac and brought it out through a  lateral stab wound in the skin.  I then closed the dorsal lumbar fascia with 1-0 Vicryl interrupted figure-of-eight sutures, subcuticular with 2-0 and skin with staples.  The wound was dressed sterilely.  The patient was placed supine on the hospital  bed, extubated without difficulty, and transported to the recovery room in satisfactory condition.  The patient tolerated the procedure well.  No complications.  Assistant, Jaclyn Bissell, [place], was used throughout the case for patient positioning, gentle intermittent neural traction, suction, and closure.  BLOOD LOSS:  200 mL.  SPECIMEN:  No specimen.   PUS D: 08/19/2023 10:24:40 am T: 08/19/2023 2:04:00 pm  JOB: [id number]/ [id number]

## 2023-08-19 NOTE — Interval H&P Note (Signed)
 History and Physical Interval Note:  08/19/2023 7:14 AM  Colin Lopez  has presented today for surgery, with the diagnosis of Stenosis L4-5.  The various methods of treatment have been discussed with the patient and family. After consideration of risks, benefits and other options for treatment, the patient has consented to  Procedure(s) with comments: LAMINECTOMY, SPINE, LUMBAR, WITH EXTRADURAL LESION EXCISION (N/A) - Central Laminectomy L4-5, excision of synovial cyst as a surgical intervention.  The patient's history has been reviewed, patient examined, no change in status, stable for surgery.  I have reviewed the patient's chart and labs.  Questions were answered to the patient's satisfaction.     Reyes JAYSON Billing

## 2023-08-20 DIAGNOSIS — K219 Gastro-esophageal reflux disease without esophagitis: Secondary | ICD-10-CM | POA: Diagnosis not present

## 2023-08-20 DIAGNOSIS — Z7902 Long term (current) use of antithrombotics/antiplatelets: Secondary | ICD-10-CM | POA: Diagnosis not present

## 2023-08-20 DIAGNOSIS — Z7982 Long term (current) use of aspirin: Secondary | ICD-10-CM | POA: Diagnosis not present

## 2023-08-20 DIAGNOSIS — I2510 Atherosclerotic heart disease of native coronary artery without angina pectoris: Secondary | ICD-10-CM | POA: Diagnosis not present

## 2023-08-20 DIAGNOSIS — I10 Essential (primary) hypertension: Secondary | ICD-10-CM | POA: Diagnosis not present

## 2023-08-20 DIAGNOSIS — Z8673 Personal history of transient ischemic attack (TIA), and cerebral infarction without residual deficits: Secondary | ICD-10-CM | POA: Diagnosis not present

## 2023-08-20 DIAGNOSIS — M419 Scoliosis, unspecified: Secondary | ICD-10-CM | POA: Diagnosis not present

## 2023-08-20 DIAGNOSIS — M48062 Spinal stenosis, lumbar region with neurogenic claudication: Secondary | ICD-10-CM | POA: Diagnosis not present

## 2023-08-20 DIAGNOSIS — M7138 Other bursal cyst, other site: Secondary | ICD-10-CM | POA: Diagnosis not present

## 2023-08-20 DIAGNOSIS — I252 Old myocardial infarction: Secondary | ICD-10-CM | POA: Diagnosis not present

## 2023-08-20 DIAGNOSIS — Z79899 Other long term (current) drug therapy: Secondary | ICD-10-CM | POA: Diagnosis not present

## 2023-08-20 LAB — BASIC METABOLIC PANEL WITH GFR
Anion gap: 10 (ref 5–15)
BUN: 21 mg/dL (ref 8–23)
CO2: 23 mmol/L (ref 22–32)
Calcium: 8.7 mg/dL — ABNORMAL LOW (ref 8.9–10.3)
Chloride: 105 mmol/L (ref 98–111)
Creatinine, Ser: 1.31 mg/dL — ABNORMAL HIGH (ref 0.61–1.24)
GFR, Estimated: 54 mL/min — ABNORMAL LOW (ref >60)
Glucose, Bld: 135 mg/dL — ABNORMAL HIGH (ref 70–99)
Potassium: 4.1 mmol/L (ref 3.5–5.1)
Sodium: 138 mmol/L (ref 135–145)

## 2023-08-20 LAB — CBC WITH DIFFERENTIAL/PLATELET
Abs Immature Granulocytes: 0.04 K/uL (ref 0.00–0.07)
Basophils Absolute: 0.1 K/uL (ref 0.0–0.1)
Basophils Relative: 1 %
Eosinophils Absolute: 0 K/uL (ref 0.0–0.5)
Eosinophils Relative: 0 %
HCT: 36.3 % — ABNORMAL LOW (ref 39.0–52.0)
Hemoglobin: 12 g/dL — ABNORMAL LOW (ref 13.0–17.0)
Immature Granulocytes: 0 %
Lymphocytes Relative: 13 %
Lymphs Abs: 1.5 K/uL (ref 0.7–4.0)
MCH: 30.5 pg (ref 26.0–34.0)
MCHC: 33.1 g/dL (ref 30.0–36.0)
MCV: 92.4 fL (ref 80.0–100.0)
Monocytes Absolute: 0.9 K/uL (ref 0.1–1.0)
Monocytes Relative: 8 %
Neutro Abs: 9 K/uL — ABNORMAL HIGH (ref 1.7–7.7)
Neutrophils Relative %: 78 %
Platelets: 112 K/uL — ABNORMAL LOW (ref 150–400)
RBC: 3.93 MIL/uL — ABNORMAL LOW (ref 4.22–5.81)
RDW: 13.6 % (ref 11.5–15.5)
WBC: 11.6 K/uL — ABNORMAL HIGH (ref 4.0–10.5)
nRBC: 0 % (ref 0.0–0.2)

## 2023-08-20 MED ORDER — DOCUSATE SODIUM 100 MG PO CAPS
100.0000 mg | ORAL_CAPSULE | Freq: Two times a day (BID) | ORAL | 2 refills | Status: AC
Start: 2023-08-20 — End: 2024-08-19

## 2023-08-20 MED ORDER — ASPIRIN EC 81 MG PO TBEC: 81.0000 mg | DELAYED_RELEASE_TABLET | Freq: Every day | ORAL | Status: AC

## 2023-08-20 MED ORDER — CLOPIDOGREL BISULFATE 75 MG PO TABS: ORAL_TABLET | ORAL | 3 refills | Status: AC

## 2023-08-20 MED ORDER — POLYETHYLENE GLYCOL 3350 17 G PO PACK: 17.0000 g | PACK | Freq: Every day | ORAL | 0 refills | Status: DC

## 2023-08-20 MED ORDER — OXYCODONE HCL 5 MG PO TABS: 5.0000 mg | ORAL_TABLET | ORAL | 0 refills | Status: DC | PRN

## 2023-08-20 NOTE — Evaluation (Signed)
 Physical Therapy Brief Evaluation and Discharge Note Patient Details Name: Colin Lopez MRN: [id number] DOB: 01-27-1940 Today's Date: 08/20/2023   History of Present Illness  83 yo male s/p 08/20/23 Laminectomy L4-5 PMH includes: HTN, NSTEMI, CAD, HTN, HLD, and prior CVA.  Clinical Impression  Patient evaluated by Physical Therapy with no further acute PT needs identified. 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 Pt verbalizes fair pain control and denies radicular symptoms. Pt ambulating 200 ft with a walker and negotiated 3 steps with a railing without physical assist. Pt utilizing moderate reliance through arms on RW, so do recommend to use for external support, balance and pain control initially. Reviewed spinal precautions and activity recommendations. All education has been completed and the patient has no further questions. See below for any follow-up Physical Therapy or equipment needs. PT is signing off. Thank you for this referral.      PT Assessment Patient does not need any further PT services  Assistance Needed at Discharge  PRN    Equipment Recommendations Rolling walker (2 wheels)  Recommendations for Other Services       Precautions/Restrictions Precautions Precautions: Fall;Back Precaution Booklet Issued: Yes (comment) Restrictions Weight Bearing Restrictions Per Provider Order: No        Mobility  Bed Mobility Rolling: Supervision Supine/Sidelying to sit: Contact guard assist Sit to supine/sidelying: Contact guard assist General bed mobility comments: HOB slightly elevated and no rail to simulate home set up, cues for log roll technique and to reach with contralateral arm for mattress to pull self over. Increased time to elevate trunk, CGA provided for safety for exiting and entering bed.  Transfers Overall transfer level: Needs assistance Equipment used: Rolling walker (2 wheels) Transfers: Sit to/from Stand Sit to Stand:  Supervision           General transfer comment: Pt pulling up on RW, but instructed to push up from bed unless someone present to stabilize RW    Ambulation/Gait Ambulation/Gait assistance: Supervision Gait Distance (Feet): 200 Feet Assistive device: Rolling walker (2 wheels) Gait Pattern/deviations: Step-through pattern, Decreased stride length Gait Speed: Below normal General Gait Details: Verbal cues for scapular depression, pt with tendency for downward gaze, moderate reliance through arms on RW  Home Activity Instructions    Stairs            Modified Rankin (Stroke Patients Only)        Balance Overall balance assessment: Needs assistance Sitting-balance support: Feet supported Sitting balance-Leahy Scale: Good     Standing balance support: Single extremity supported, During functional activity Standing balance-Leahy Scale: Fair            Pertinent Vitals/Pain PT - Brief Vital Signs All Vital Signs Stable: Yes Pain Assessment Pain Assessment: Faces Faces Pain Scale: Hurts little more Pain Location: incisional Pain Descriptors / Indicators: Operative site guarding, Sore Pain Intervention(s): Monitored during session     Home Living Family/patient expects to be discharged to:: Private residence Living Arrangements: Spouse/significant other Available Help at Discharge: Family Home Environment: Stairs to enter  Landscape architect of Steps: 3 Home Equipment: Cane - single point        Prior Function Level of Independence: Independent Comments: Likes to restore old cars    UE/LE Assessment   UE ROM/Strength/Tone/Coordination: WFL    LE ROM/Strength/Tone/Coordination: Impaired LE ROM/Strength/Tone/Coordination Deficits: Hx of stroke with distal LLE weakness    Communication   Communication Communication: No apparent difficulties     Cognition  Overall Cognitive Status: Appears within functional limits for tasks assessed/performed        General Comments      Exercises     Assessment/Plan    PT Problem List         PT Visit Diagnosis Unsteadiness on feet (R26.81);Pain    No Skilled PT All education completed;Patient will have necessary level of assist by caregiver at discharge;Patient is supervision for all activity/mobility   Co-evaluation                AMPAC 6 Clicks Help needed turning from your back to your side while in a flat bed without using bedrails?: A Little Help needed moving from lying on your back to sitting on the side of a flat bed without using bedrails?: A Little Help needed moving to and from a bed to a chair (including a wheelchair)?: A Little Help needed standing up from a chair using your arms (e.g., wheelchair or bedside chair)?: A Little Help needed to walk in hospital room?: A Little Help needed climbing 3-5 steps with a railing? : A Little 6 Click Score: 18      End of Session Equipment Utilized During Treatment: Gait belt Activity Tolerance: Patient tolerated treatment well Patient left: in bed;with call bell/phone within reach;with family/visitor present Nurse Communication: Mobility status PT Visit Diagnosis: Unsteadiness on feet (R26.81);Pain Pain - part of body:  (back)     Time: 9196-9168 PT Time Calculation (min) (ACUTE ONLY): 28 min  Charges:   PT Evaluation $PT Eval Low Complexity: 1 Low PT Treatments $Therapeutic Activity: 8-22 mins    Aleck Daring, PT, DPT Acute Rehabilitation Services Office [phone]   Alayne ONEIDA Daring  08/20/2023, 8:45 AM

## 2023-08-20 NOTE — Discharge Summary (Signed)
 Physician Discharge Summary   Patient ID: Colin Lopez MRN: [id number] DOB/AGE: 1940-04-02 83 y.o.  Admit date: 08/19/2023 Discharge date: 08/20/2023  Primary Diagnosis:   Stenosis L4-5  Admission Diagnoses:  Past Medical History:  Diagnosis Date   CAD (coronary artery disease), native coronary artery    09/09/16 PCI/DES x3 to mRCA, OM1 and dLcx/OM2.    Coronary disease    Status post stenting of the left circumflex coronary in 2009 with a bare-metal stent (with a 3.5x70mm Liberte stent)   Dyslipidemia    Dyspnea on exertion    Exertional chest pain    Exposure to TB    GERD (gastroesophageal reflux disease)    Hyperlipidemia    Hypertension    Hypertriglyceridemia    NSTEMI (non-ST elevated myocardial infarction) (HCC) 2009   BMS CFX   Numbness and tingling of left side of face    with left lip/face droop (Concern for Bell's Palsy)   Pneumonia    x3   Rash and nonspecific skin eruption    Lower right anterior leg   Stroke (HCC)    TIA (transient ischemic attack)    history of tia   Urinary tract infection    Discharge Diagnoses:   Principal Problem:   Spinal stenosis at L4-L5 level  Procedure:  Procedure(s) (LRB): LAMINECTOMY, SPINE, LUMBAR, WITH EXTRADURAL LESION EXCISION (N/A)   Consults: None  HPI:  See H&P    Laboratory Data: Hospital Outpatient Visit on 08/12/2023  Component Date Value Ref Range Status   Sodium 08/12/2023 134 (L)  135 - 145 mmol/L Final   Potassium 08/12/2023 4.6  3.5 - 5.1 mmol/L Final   Chloride 08/12/2023 102  98 - 111 mmol/L Final   CO2 08/12/2023 25  22 - 32 mmol/L Final   Glucose, Bld 08/12/2023 102 (H)  70 - 99 mg/dL Final   Glucose reference range applies only to samples taken after fasting for at least 8 hours.   BUN 08/12/2023 20  8 - 23 mg/dL Final   Creatinine, Ser 08/12/2023 1.23  0.61 - 1.24 mg/dL Final   Calcium  08/12/2023 9.2  8.9 - 10.3 mg/dL Final   GFR, Estimated 08/12/2023 58 (L)  >60 mL/min Final   Comment:  (NOTE) Calculated using the CKD-EPI Creatinine Equation (2021)    Anion gap 08/12/2023 7  5 - 15 Final   Performed at [facility], [address] [place], [place] [address]   MRSA, PCR 08/12/2023 NEGATIVE  NEGATIVE Final   Staphylococcus aureus 08/12/2023 NEGATIVE  NEGATIVE Final   Comment: (NOTE) The Xpert SA Assay (FDA approved for NASAL specimens in patients 71 years of age and older), is one component of a comprehensive surveillance program. It is not intended to diagnose infection nor to guide or monitor treatment. Performed at [facility], [address] [place], [place] [address]    No results for input(s): HGB in the last 72 hours. No results for input(s): WBC, RBC, HCT, PLT in the last 72 hours. No results for input(s): NA, K, CL, CO2, BUN, CREATININE, GLUCOSE, CALCIUM  in the last 72 hours. No results for input(s): LABPT, INR in the last 72 hours.  X-Rays:DG Lumbar Spine 2-3 Views Result Date: 08/19/2023 CLINICAL DATA:  Elective surgery. EXAM: LUMBAR SPINE - 2-3 VIEW COMPARISON:  Preoperative radiograph 08/12/2023 FINDINGS: Three cross-table lateral views of the lumbar spine submitted from the operating room. Image 1 demonstrates surgical instruments posteriorly at the L4-L5 and L5-S1 level. Image 2 demonstrates  surgical instrument posteriorly at the L3-L4 and L4 level. Image 3 demonstrates surgical instruments posteriorly at the L3-L4 level. IMPRESSION: Intraoperative localization during lumbar surgery. Electronically Signed   By: Andrea Gasman M.D.   On: 08/19/2023 11:14   DG Lumbar Spine 2-3 Views Result Date: 08/12/2023 CLINICAL DATA:  Herniated nucleus pulposis, lumbar.  Preop. EXAM: LUMBAR SPINE - 2-3 VIEW COMPARISON:  None Available. FINDINGS: Please note there are 6 non-rib-bearing lumbar vertebra. The lower most lumbar vertebra is labeled L5. Vertebral bodies are labeled. Minor levo scoliotic curvature centered at L2-L3.  Straightening of normal lordosis. Moderate disc space narrowing and spurring throughout the lumbar spine. Moderate lower lumbar facet hypertrophy. No fracture or compression deformity. Aortic atherosclerosis IMPRESSION: 1. Moderate multilevel degenerative disc disease and facet hypertrophy throughout the lumbar spine. 2. Minor levo scoliotic curvature. 3. Suspected 6 non-rib-bearing lumbar vertebra. The lower most lumbar vertebra was labeled L5. Vertebral bodies were labeled. Electronically Signed   By: Andrea Gasman M.D.   On: 08/12/2023 17:09   DG Chest 2 View Result Date: 07/23/2023 CLINICAL DATA:  Cough.  Preoperative clearance. EXAM: CHEST - 2 VIEW COMPARISON:  Chest radiograph 09/22/2022. Chest CT 03/15/2020, additional prior exams reviewed. FINDINGS: The cardiomediastinal contours are normal. Aortic atherosclerosis. 16 mm nodule projects over the left lower lung zone, 19 mm left lower lobe nodule is present on prior chest CT. This is been stable over serial exams and is considered benign. Pulmonary vasculature is normal. No consolidation, pleural effusion, or pneumothorax. No acute osseous abnormalities are seen. Thoracic spondylosis with spurring. IMPRESSION: 1. No acute chest findings. 2.  Aortic Atherosclerosis (ICD10-I70.0). 3. Left lower lobe pulmonary nodule, stable from prior CTs. Electronically Signed   By: Andrea Gasman M.D.   On: 07/23/2023 16:31    EKG: Orders placed or performed in visit on 06/30/23   EKG 12-Lead     Hospital Course: Patient was admitted to [facility] and taken to the OR and underwent the above state procedure without complications.  Patient tolerated the procedure well and was later transferred to the recovery room and then to the orthopaedic floor for postoperative care.  They were given PO and IV analgesics for pain control following their surgery.  They were given 24 hours of postoperative antibiotics.   PT was consulted postop to assist with  mobility and transfers.  The patient was allowed to be WBAT with therapy and was taught back precautions. Discharge planning was consulted to help with postop disposition and equipment needs.  Patient had a fair night on the evening of surgery and started to get up OOB with therapy on day one. Patient was seen in rounds and was ready to go home on day one.  They were given discharge instructions and dressing directions.  They were instructed on when to follow up in the office with Dr. Duwayne.   Diet: Regular diet Activity:WBAT, lumbar precautions Follow-up:in 10-14 days Disposition - Home Discharged Condition: good   Discharge Instructions     Call MD / Call 911   Complete by: As directed    If you experience chest pain or shortness of breath, CALL 911 and be transported to the hospital emergency room.  If you develope a fever above 101 F, pus (white drainage) or increased drainage or redness at the wound, or calf pain, call your surgeon's office.   Constipation Prevention   Complete by: As directed    Drink plenty of fluids.  Prune juice may be helpful.  You may use a stool softener, such as Colace (over the counter) 100 mg twice a day.  Use MiraLax  (over the counter) for constipation as needed.   Diet - low sodium heart healthy   Complete by: As directed    Increase activity slowly as tolerated   Complete by: As directed    Post-operative opioid taper instructions:   Complete by: As directed    POST-OPERATIVE OPIOID TAPER INSTRUCTIONS: It is important to wean off of your opioid medication as soon as possible. If you do not need pain medication after your surgery it is ok to stop day one. Opioids include: Codeine , Hydrocodone (Norco, Vicodin), Oxycodone (Percocet, oxycontin ) and hydromorphone  amongst others.  Long term and even short term use of opiods can cause: Increased pain response Dependence Constipation Depression Respiratory depression And more.  Withdrawal symptoms can  include Flu like symptoms Nausea, vomiting And more Techniques to manage these symptoms Hydrate well Eat regular healthy meals Stay active Use relaxation techniques(deep breathing, meditating, yoga) Do Not substitute Alcohol to help with tapering If you have been on opioids for less than two weeks and do not have pain than it is ok to stop all together.  Plan to wean off of opioids This plan should start within one week post op of your joint replacement. Maintain the same interval or time between taking each dose and first decrease the dose.  Cut the total daily intake of opioids by one tablet each day Next start to increase the time between doses. The last dose that should be eliminated is the evening dose.         Allergies as of 08/20/2023       Reactions   Penicillins Hives   Has patient had a PCN reaction causing immediate rash, facial/tongue/throat swelling, SOB or lightheadedness with hypotension: No Has patient had a PCN reaction causing severe rash involving mucus membranes or skin necrosis: Yes Has patient had a PCN reaction that required hospitalization: No Has patient had a PCN reaction occurring within the last 10 years: No If all of the above answers are NO, then may proceed with Cephalosporin use. Ceftriaxone  Ok   Doxycycline  Rash   Water  blisters   Levaquin  [levofloxacin  In D5w] Rash        Medication List     STOP taking these medications    Repatha  SureClick 140 MG/ML Soaj Generic drug: Evolocumab        TAKE these medications    acetaminophen  500 MG tablet Commonly known as: TYLENOL  Take 1,000 mg by mouth every 6 (six) hours as needed for headache (pain).   aspirin  EC 81 MG tablet Take 1 tablet (81 mg total) by mouth daily. Start taking on: August 25, 2023 What changed: These instructions start on August 25, 2023. If you are unsure what to do until then, ask your doctor or other care provider.   clopidogrel  75 MG tablet Commonly known  as: PLAVIX  TAKE 1 TABLET BY MOUTH EVERY DAY WITH BREAKFAST. Start taking on: August 25, 2023 What changed:  See the new instructions. These instructions start on August 25, 2023. If you are unsure what to do until then, ask your doctor or other care provider.   docusate sodium  100 MG capsule Commonly known as: Colace Take 1 capsule (100 mg total) by mouth 2 (two) times daily.   ezetimibe  10 MG tablet Commonly known as: ZETIA  TAKE 1 TABLET BY MOUTH EVERY DAY   fenofibrate  160 MG tablet TAKE 1 TABLET BY MOUTH EVERY DAY  What changed: when to take this   finasteride  5 MG tablet Commonly known as: PROSCAR  Take 5 mg by mouth every evening.   nebivolol  10 MG tablet Commonly known as: BYSTOLIC  TAKE 1 TABLET BY MOUTH EVERY DAY What changed: when to take this   nitroGLYCERIN  0.4 MG SL tablet Commonly known as: Nitrostat  Place 1 tablet (0.4 mg total) under the tongue every 5 (five) minutes as needed for chest pain.   oxyCODONE  5 MG immediate release tablet Commonly known as: Oxy IR/ROXICODONE  Take 1 tablet (5 mg total) by mouth every 4 (four) hours as needed for severe pain (pain score 7-10).   pantoprazole  40 MG tablet Commonly known as: PROTONIX  Take 1 tablet (40 mg total) by mouth daily.   polyethylene glycol 17 g packet Commonly known as: MIRALAX  / GLYCOLAX  Take 17 g by mouth daily.   tamsulosin  0.4 MG Caps capsule Commonly known as: FLOMAX  Take 0.4 mg by mouth in the morning and at bedtime.        Follow-up Information     Duwayne Purchase, MD Follow up in 2 week(s).   Specialty: Orthopedic Surgery Contact information: [address] [place] [address] [phone]                 Signed: Dorrian Doggett, PA-C Orthopaedic Surgery 08/20/2023, 8:08 AM

## 2023-08-20 NOTE — Plan of Care (Signed)

## 2023-08-20 NOTE — Evaluation (Signed)
 Occupational Therapy Evaluation Patient Details Name: Colin Lopez MRN: [id number] DOB: 23-Dec-1940 Today's Date: 08/20/2023   History of Present Illness   83 yo male s/p 08/20/23 Laminectomy L4-5 PMH includes: HTN, NSTEMI, CAD, HTN, HLD, and prior CVA.     Clinical Impressions Patient evaluated by Occupational Therapy with no further acute OT needs identified. All education has been completed and the patient has no further questions. See below for any follow-up Occupational Therapy or equipment needs. OT to sign off. Thank you for referral.       If plan is discharge home, recommend the following:         Functional Status Assessment   Patient has had a recent decline in their functional status and demonstrates the ability to make significant improvements in function in a reasonable and predictable amount of time.     Equipment Recommendations   Other (comment) (RW)     Recommendations for Other Services         Precautions/Restrictions   Precautions Precautions: Fall;Back Precaution Booklet Issued: Yes (comment) Restrictions Weight Bearing Restrictions Per Provider Order: No     Mobility Bed Mobility Overal bed mobility: Needs Assistance Bed Mobility: Rolling, Supine to Sit, Sit to Supine Rolling: Contact guard assist   Supine to sit: Contact guard Sit to supine: Min assist   General bed mobility comments: requires (A) to elevate bil Le back onto bed surface. wife present reinforced body positioning    Transfers Overall transfer level: Needs assistance Equipment used: Rolling walker (2 wheels) Transfers: Sit to/from Stand Sit to Stand: Supervision           General transfer comment: Pt pulling up on RW, but instructed to push up from bed unless someone present to stabilize RW      Balance Overall balance assessment: Needs assistance Sitting-balance support: Feet supported Sitting balance-Leahy Scale: Good     Standing balance support:  Single extremity supported, During functional activity Standing balance-Leahy Scale: Fair                             ADL either performed or assessed with clinical judgement   ADL Overall ADL's : Needs assistance/impaired Eating/Feeding: Independent     Grooming Details (indicate cue type and reason): educated on 2c up method and shaving. plans to use an Neurosurgeon initially Upper Body Bathing: Modified independent   Lower Body Bathing: Contact guard assist;Sit to/from stand;Cueing for back precautions;With adaptive equipment   Upper Body Dressing : Modified independent;Sitting   Lower Body Dressing: Contact guard assist;Sit to/from stand;Cueing for back precautions;With adaptive equipment Lower Body Dressing Details (indicate cue type and reason): dressing for home. noted to have a small dot of red blood the size of a pencil eraser                     Vision Patient Visual Report: No change from baseline       Perception         Praxis         Pertinent Vitals/Pain Pain Assessment Pain Assessment: Faces Faces Pain Scale: Hurts a little bit Pain Location: incisional Pain Descriptors / Indicators: Operative site guarding, Sore Pain Intervention(s): Monitored during session, Premedicated before session, Repositioned     Extremity/Trunk Assessment Upper Extremity Assessment Upper Extremity Assessment: Overall WFL for tasks assessed   Lower Extremity Assessment Lower Extremity Assessment: Defer to PT evaluation   Cervical / Trunk  Assessment Cervical / Trunk Assessment: Back Surgery   Communication Communication Communication: No apparent difficulties   Cognition Arousal: Alert Behavior During Therapy: WFL for tasks assessed/performed Cognition: No apparent impairments                               Following commands: Intact       Cueing  General Comments      dressing intact small red dot on dressing- recent removal  of drain   Exercises     Shoulder Instructions      Home Living Family/patient expects to be discharged to:: Private residence Living Arrangements: Spouse/significant other Available Help at Discharge: Family Type of Home: House Home Access: Stairs to enter [employer] of Steps: 7 at front Bil rails, 3 steps in the back Entrance Stairs-Rails: Left;Right Home Layout: Two level;Able to live on main level with bedroom/bathroom     Bathroom Shower/Tub: Chief Strategy Officer: Standard     Home Equipment: Cane - single point;Adaptive equipment Adaptive Equipment: Reacher    Back handout provided and reviewed adls in detail. Pt educated on: set an alarm at night for medication, avoid sitting for long periods of time, correct bed positioning for sleeping, correct sequence for bed mobility, avoiding lifting more than 5 pounds and never wash directly over incision. All education is complete and patient indicates understanding.     Prior Functioning/Environment Prior Level of Function : Independent/Modified Independent;Driving                    OT Problem List:     OT Treatment/Interventions:        OT Goals(Current goals can be found in the care plan section)   Acute Rehab OT Goals Patient Stated Goal: to return home Potential to Achieve Goals: Good   OT Frequency:       Co-evaluation              AM-PAC OT 6 Clicks Daily Activity     Outcome Measure Help from another person eating meals?: None Help from another person taking care of personal grooming?: None Help from another person toileting, which includes using toliet, bedpan, or urinal?: None Help from another person bathing (including washing, rinsing, drying)?: None Help from another person to put on and taking off regular upper body clothing?: None Help from another person to put on and taking off regular lower body clothing?: None 6 Click Score: 24   End of Session  Equipment Utilized During Treatment: Gait belt;Rolling walker (2 wheels) Nurse Communication: Mobility status;Precautions  Activity Tolerance: Patient tolerated treatment well Patient left: in bed;with call bell/phone within reach;with family/visitor present  OT Visit Diagnosis: Unsteadiness on feet (R26.81)                Time: 9157-9084 OT Time Calculation (min): 33 min Charges:  OT General Charges $OT Visit: 1 Visit OT Evaluation $OT Eval Moderate Complexity: 1 Mod OT Treatments $Self Care/Home Management : 8-22 mins   Brynn, OTR/L  Acute Rehabilitation Services Office: [phone] .   Ely Molt 08/20/2023, 9:25 AM

## 2023-08-20 NOTE — Progress Notes (Signed)
 Subjective: 1 Day Post-Op Procedure(s) (LRB): LAMINECTOMY, SPINE, LUMBAR, WITH EXTRADURAL LESION EXCISION (N/A) Patient reports pain as mild.    Objective: Vital signs in last 24 hours: Temp:  [97.5 F (36.4 C)-98.4 F (36.9 C)] 97.6 F (36.4 C) (08/08 0725) Pulse Rate:  [59-82] 70 (08/08 0725) Resp:  [14-21] 20 (08/08 0725) BP: (101-147)/(52-78) 147/70 (08/08 0725) SpO2:  [92 %-96 %] 94 % (08/08 0725)  Intake/Output from previous day: 08/07 0701 - 08/08 0700 In: 3085 [P.O.:1680; I.V.:1000; IV Piggyback:405] Out: 475 [Urine:200; Drains:75; Blood:200] Intake/Output this shift: No intake/output data recorded.  No results for input(s): HGB in the last 72 hours. No results for input(s): WBC, RBC, HCT, PLT in the last 72 hours. No results for input(s): NA, K, CL, CO2, BUN, CREATININE, GLUCOSE, CALCIUM  in the last 72 hours. No results for input(s): LABPT, INR in the last 72 hours.  Neurologically intact ABD soft Neurovascular intact Sensation intact distally Intact pulses distally Dorsiflexion/Plantar flexion intact Incision: dressing C/D/I and no drainage No cellulitis present Compartment soft No sign of DVT   Assessment/Plan: 1 Day Post-Op Procedure(s) (LRB): LAMINECTOMY, SPINE, LUMBAR, WITH EXTRADURAL LESION EXCISION (N/A) Advance diet Up with therapy D/C IV fluids D/C to home today Discussed D//C instructions, Lspine precautions   Colin Lopez 08/20/2023, 8:06 AM

## 2023-09-03 ENCOUNTER — Ambulatory Visit

## 2023-09-21 DIAGNOSIS — M545 Low back pain, unspecified: Secondary | ICD-10-CM | POA: Diagnosis not present

## 2023-09-24 DIAGNOSIS — M545 Low back pain, unspecified: Secondary | ICD-10-CM | POA: Diagnosis not present

## 2023-09-27 ENCOUNTER — Encounter: Admitting: Nurse Practitioner

## 2023-09-27 DIAGNOSIS — M545 Low back pain, unspecified: Secondary | ICD-10-CM | POA: Diagnosis not present

## 2023-09-27 NOTE — Progress Notes (Deleted)
   Established Patient Office Visit  Subjective   Patient ID: Colin Lopez, male    DOB: 03/22/1940  Age: 83 y.o. MRN: [id number]  No chief complaint on file.   HPI  CVA: Currently maintained on clopidogrel  75 mg daily  HTN: Currently maintained on Nebivolol  10 mg daily  BPH: Currently maintained on tamsulosin  0.8 mg daily, finasteride  5 mg daily  HLD: Currently maintained on ezetimibe  10 mg daily, fenofibrate  160 mg daily  GERD: Currently maintained on Protonix  40 mg daily  for complete physical and follow up of chronic conditions.  Immunizations: -Tetanus: Completed in 2021 -Influenza:  -Shingles:? -Pneumonia: Completed 2018 -COVID:  Diet: Fair diet.  Exercise: No regular exercise.  Eye exam: Completes annually  Dental exam: Completes semi-annually    Colonoscopy: Aged out? Lung Cancer Screening: N/A  PSA: Due  Sleep:  Advanced directive:     {History (Optional):23778}  ROS    Objective:     There were no vitals taken for this visit. {Vitals History (Optional):23777}  Physical Exam   No results found for any visits on 09/27/23.  {Labs (Optional):23779}  The ASCVD Risk score (Arnett DK, et al., 2019) failed to calculate for the following reasons:   The 2019 ASCVD risk score is only valid for ages 51 to 33   Risk score cannot be calculated because patient has a medical history suggesting prior/existing ASCVD    Assessment & Plan:   Problem List Items Addressed This Visit   None   No follow-ups on file.    Adina Crandall, NP

## 2023-11-04 ENCOUNTER — Encounter: Payer: Self-pay | Admitting: Pharmacist

## 2023-11-04 NOTE — Progress Notes (Signed)
 Pharmacy Quality Measure Review  This patient is appearing on a report for being at risk of failing the adherence measure for cholesterol (statin) medications this calendar year.   Medication: atorvastatin  40 mg Last fill date: 05/23/23 for 90 day supply  Per cardiology, statin holiday was advised due to joint aches.   For 2026, will need to address intolerance via ICD 0 code to avoid failing SPC measure.    No future visits scheduled with PCP.  ICD-10-CM code exceptions:  Muscular pain:  Myopathy ICD-10-CM codes include G72.0, G72.2, and G72.9.  Myositis ICD-10-CM codes include M60.80, M60.811, M60.812, and more.   Myalgia ICD-10-CM codes include M79.10, M79.11, M79.12, and more.  Rhabdomyolysis ICD-10-CM code is M62.82.

## 2023-11-10 ENCOUNTER — Other Ambulatory Visit: Payer: Self-pay | Admitting: Cardiology

## 2023-11-18 ENCOUNTER — Other Ambulatory Visit: Payer: Self-pay | Admitting: Cardiology

## 2023-12-08 ENCOUNTER — Telehealth: Payer: Self-pay | Admitting: Pharmacy Technician

## 2023-12-08 DIAGNOSIS — E785 Hyperlipidemia, unspecified: Secondary | ICD-10-CM

## 2023-12-08 DIAGNOSIS — Z79899 Other long term (current) drug therapy: Secondary | ICD-10-CM

## 2023-12-08 DIAGNOSIS — I2510 Atherosclerotic heart disease of native coronary artery without angina pectoris: Secondary | ICD-10-CM

## 2023-12-08 NOTE — Telephone Encounter (Signed)
 Hi insurance is asking for updated labs for this repatha  prior authorization. Patient started repatha  in April and last labs were before starting repatha . Can he please get updated lipid labs so we can finish this prior authorization? Thank you

## 2023-12-08 NOTE — Telephone Encounter (Signed)
 S/w [name]- made aware that fasting lab work is needed for PA for Repatha .   Patient will get labs on Monday 12/13/23- will come to this office, 1st floor lab. Informed that lab order is in the computer, no slip needed. She verbalized understanding.

## 2023-12-13 ENCOUNTER — Other Ambulatory Visit (HOSPITAL_BASED_OUTPATIENT_CLINIC_OR_DEPARTMENT_OTHER): Payer: Self-pay | Admitting: Cardiology

## 2023-12-14 ENCOUNTER — Ambulatory Visit: Payer: Self-pay | Admitting: Cardiology

## 2023-12-14 ENCOUNTER — Other Ambulatory Visit (HOSPITAL_COMMUNITY): Payer: Self-pay

## 2023-12-14 LAB — HEPATIC FUNCTION PANEL
ALT: 17 IU/L (ref 0–44)
AST: 25 IU/L (ref 0–40)
Albumin: 4.1 g/dL (ref 3.7–4.7)
Alkaline Phosphatase: 60 IU/L (ref 48–129)
Bilirubin Total: 0.7 mg/dL (ref 0.0–1.2)
Bilirubin, Direct: 0.3 mg/dL (ref 0.00–0.40)
Total Protein: 5.9 g/dL — ABNORMAL LOW (ref 6.0–8.5)

## 2023-12-14 LAB — LIPID PANEL
Chol/HDL Ratio: 3.1 ratio (ref 0.0–5.0)
Cholesterol, Total: 100 mg/dL (ref 100–199)
HDL: 32 mg/dL — ABNORMAL LOW (ref >39)
LDL Chol Calc (NIH): 43 mg/dL (ref 0–99)
Triglycerides: 143 mg/dL (ref 0–149)
VLDL Cholesterol Cal: 25 mg/dL (ref 5–40)

## 2023-12-14 NOTE — Telephone Encounter (Signed)
 Pharmacy Patient Advocate Encounter  Received notification from [facility] that Prior Authorization for repatha  has been APPROVED from 12/14/23 to 01/11/25   PA #/Case ID/Reference #: [id number]

## 2023-12-14 NOTE — Telephone Encounter (Signed)
 Pharmacy Patient Advocate Encounter   Received notification from CoverMyMeds that prior authorization for REPATHA  is required/requested.   Insurance verification completed.   The patient is insured through [facility].   Per test claim: PA required; PA submitted to above mentioned insurance via Latent Key/confirmation #/EOC [id number] Status is pending

## 2023-12-26 NOTE — Progress Notes (Unsigned)
 Colin Lopez Mania Date of Birth: Jan 22, 1940 Medical Record #[id number]  History of Present Illness: Colin Lopez is seen for follow up CAD.  He has a history of coronary disease with a non-ST elevation myocardial infarction in 2009. He underwent stenting of the left circumflex coronary with a 3.5 x 16 mm Liberte stent. He does have a history of hypertension and hyperlipidemia. He is intolerant to Crestor .  He was seen in February 2018 with some atypical left shoulder and neck pain.  A Myoview  study was ordered but was never done. He had a CT chest  which was negative for PE. There was a rounded density in the LLL of unclear etiology. Later PET scan in June showed low metabolic activity and low risk.    He underwent a Lexiscan  myoview  study which showed multiple perfusion defects and EF 37%. This led to a cardiac cath in late August 2018  that showed 3 vessel disease with successful management with DES x 3 to OM1, distal LCx, and mid RCA.   He was admitted in late September 2018. He presented with headache, blurred vision, generalized weakness, dizziness.  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  CT angiogram of the head and neck showed both ICA widely patent, atherosclerotic disease of the supraclinoid internal carotid arteries. No intracranial branch vessel disease.  2-D echo on 09/16/16 showed EF of 55-60% with grade 1 diastolic dysfunction, no wall motion abnormalities. Neurology consulted, continue aspirin , Plavix .   He was admitted in May 2021 with AKI and urinary infection.  Lexiscan  Myoview  in 12/2021 was low risk, no evidence of ischemia or infarction, EF 54%.  He has statin intolerance. He was referred to lipid clinic Pharm.D. and was started on Repatha .  Repeat echocardiogram in 05/2023 showed EF 55 to 60%, normal LV function, no RWMA, normal RV systolic function, no significant  valvular abnormalities, mild dilation of the ascending aorta measuring 42 mm.   He underwent L4-5 laminectomy by Dr Duwayne in August.   On follow up today he is doing well. Notes some SOB due to asthma. No chest pain, dizziness or palpitations. Tolerating medication well. Had elevated CPK in past that resolved with reduction in lipitor  dose. Is active building old cars and deer hunting in the fall.   Current Outpatient Medications on File Prior to Visit  Medication Sig Dispense Refill   acetaminophen  (TYLENOL ) 500 MG tablet Take 1,000 mg by mouth every 6 (six) hours as needed for headache (pain).     aspirin  EC 81 MG tablet Take 1 tablet (81 mg total) by mouth daily.     clopidogrel  (PLAVIX ) 75 MG tablet TAKE 1 TABLET BY MOUTH EVERY DAY WITH BREAKFAST. 90 tablet 3   docusate sodium  (COLACE) 100 MG capsule Take 1 capsule (100 mg total) by mouth 2 (two) times daily. 60 capsule 2   ezetimibe  (ZETIA ) 10 MG tablet TAKE 1 TABLET BY MOUTH EVERY DAY 90 tablet 3   fenofibrate  160 MG tablet TAKE 1 TABLET BY MOUTH EVERY DAY (Patient taking differently: Take 160 mg by mouth every evening.) 90 tablet 3   finasteride  (PROSCAR ) 5 MG tablet Take 5 mg by mouth every evening.     nebivolol  (BYSTOLIC ) 10 MG tablet TAKE 1 TABLET BY MOUTH EVERY DAY 90 tablet 2   nitroGLYCERIN  (NITROSTAT ) 0.4 MG SL tablet Place 1 tablet (0.4 mg total) under the tongue every 5 (  five) minutes as needed for chest pain. 25 tablet 3   oxyCODONE  (OXY IR/ROXICODONE ) 5 MG immediate release tablet Take 1 tablet (5 mg total) by mouth every 4 (four) hours as needed for severe pain (pain score 7-10). 40 tablet 0   pantoprazole  (PROTONIX ) 40 MG tablet TAKE 1 TABLET BY MOUTH EVERY DAY 90 tablet 1   polyethylene glycol (MIRALAX  / GLYCOLAX ) 17 g packet Take 17 g by mouth daily. 14 each 0   tamsulosin  (FLOMAX ) 0.4 MG CAPS capsule Take 0.4 mg by mouth in the morning and at bedtime.     No current facility-administered medications on file prior to  visit.    Allergies  Allergen Reactions   Penicillins Hives    Has patient had a PCN reaction causing immediate rash, facial/tongue/throat swelling, SOB or lightheadedness with hypotension: No Has patient had a PCN reaction causing severe rash involving mucus membranes or skin necrosis: Yes Has patient had a PCN reaction that required hospitalization: No Has patient had a PCN reaction occurring within the last 10 years: No If all of the above answers are NO, then may proceed with Cephalosporin use. Ceftriaxone  Ok   Doxycycline  Rash    Water  blisters   Levaquin  [Levofloxacin  In D5w] Rash    Past Medical History:  Diagnosis Date   CAD (coronary artery disease), native coronary artery    09/09/16 PCI/DES x3 to mRCA, OM1 and dLcx/OM2.    Coronary disease    Status post stenting of the left circumflex coronary in 2009 with a bare-metal stent (with a 3.5x86mm Liberte stent)   Dyslipidemia    Dyspnea on exertion    Exertional chest pain    Exposure to TB    GERD (gastroesophageal reflux disease)    Hyperlipidemia    Hypertension    Hypertriglyceridemia    NSTEMI (non-ST elevated myocardial infarction) (HCC) 2009   BMS CFX   Numbness and tingling of left side of face    with left lip/face droop (Concern for Bell's Palsy)   Pneumonia    x3   Rash and nonspecific skin eruption    Lower right anterior leg   Stroke (HCC)    TIA (transient ischemic attack)    history of tia   Urinary tract infection     Past Surgical History:  Procedure Laterality Date   BACK SURGERY     bone spur removal   CARDIOVASCULAR STRESS TEST  10/03/2008   EF 59%   CORONARY ANGIOPLASTY WITH STENT PLACEMENT  09/09/2016   CORONARY STENT INTERVENTION N/A 09/09/2016   Procedure: CORONARY STENT INTERVENTION;  Surgeon: Caelen Higinbotham M, MD;  Location: [facility] INVASIVE CV LAB;  Service: Cardiovascular;  Laterality: N/A;   LEFT HEART CATH AND CORONARY ANGIOGRAPHY N/A 09/09/2016   Procedure: LEFT HEART CATH AND  CORONARY ANGIOGRAPHY;  Surgeon: Ajit Errico M, MD;  [facility] INVASIVE CV LAB;  Service: Cardiovascular;  Laterality: N/A;   US  ECHOCARDIOGRAPHY  09/21/2008   EF 55-60%    Social History   Tobacco Use  Smoking Status Never  Smokeless Tobacco Never    Social History   Substance and Sexual Activity  Alcohol Use Not Currently   Comment: Rarely     Family History  Problem Relation Age of Onset   Diabetes Mother    CAD Sister 72       MI, obese   Cancer Brother        stomach    Review of Systems: As noted in history of present  illness.  All other systems were reviewed and are negative.  Physical Exam: There were no vitals taken for this visit. GENERAL:  Well appearing WM in NAD HEENT:  PERRL, EOMI, sclera are clear. Oropharynx is clear. NECK:  No jugular venous distention, carotid upstroke brisk and symmetric, no bruits, no thyromegaly or adenopathy LUNGS:  Clear to auscultation bilaterally CHEST:  Unremarkable HEART:  RRR,  PMI not displaced or sustained,S1 and S2 within normal limits, no S3, no S4: no clicks, no rubs, no murmurs ABD:  Soft, nontender. BS +, no masses or bruits. No hepatomegaly, no splenomegaly EXT:  2 + pulses throughout, no edema, no cyanosis no clubbing SKIN:  Warm and dry.  No rashes NEURO:  Alert and oriented x 3. Cranial nerves II through XII intact. PSYCH:  Cognitively intact      LABORATORY DATA:   Lab Results  Component Value Date   WBC 11.6 (H) 08/20/2023   HGB 12.0 (L) 08/20/2023   HCT 36.3 (L) 08/20/2023   PLT 112 (L) 08/20/2023   GLUCOSE 135 (H) 08/20/2023   CHOL 100 12/13/2023   TRIG 143 12/13/2023   HDL 32 (L) 12/13/2023   LDLDIRECT 74.0 12/17/2014   LDLCALC 43 12/13/2023   ALT 17 12/13/2023   AST 25 12/13/2023   NA 138 08/20/2023   K 4.1 08/20/2023   CL 105 08/20/2023   CREATININE 1.31 (H) 08/20/2023   BUN 21 08/20/2023   CO2 23 08/20/2023   TSH 3.25 09/22/2022   PSA 2.05 06/01/2019   INR 0.9 09/08/2016    HGBA1C 5.1 10/12/2016    Dated 05/27/17: cholesterol 151, triglycerides 129, HDL 57, LDL 69. CBC, TSH, CMET, PSA all normal.  Ecg today shows NSR normal Ecg. Rate 62. I have personally reviewed and interpreted this study.     Cardiac cath/PCI 09/09/16: Procedures   CORONARY STENT INTERVENTION  LEFT HEART CATH AND CORONARY ANGIOGRAPHY  Conclusion     Mid LAD to Dist LAD lesion, 30 %stenosed. Prox Cx lesion, 25 %stenosed. Mid RCA lesion, 95 %stenosed. A STENT SIERRA 3.50 X 23 MM drug eluting stent was successfully placed. Post intervention, there is a 0% residual stenosis. Mid Cx lesion, 70 %stenosed. A STENT SIERRA 2.75 X 12 MM drug eluting stent was successfully placed. Post intervention, there is a 0% residual stenosis. Ost 1st Mrg lesion, 90 %stenosed. A STENT SIERRA 3.00 X 12 MM drug eluting stent was successfully placed. Post intervention, there is a 0% residual stenosis.   1. Severe 2 vessel obstructive CAD    - 90% ostial OM1    - 70% distal LCx/OM2    - 95% mid RCA 2. Successful stenting of the mid RCA with DES 3. Successful stenting of the ostium of OM1 with DES 4. Successful stenting of the distal LCx/OM2   Plan: DAPT for at least one year. Anticipate DC in am.         Coronary Diagrams   Diagnostic Diagram       Post-Intervention Diagram        Myoview  12/25/21: Study Highlights Show Result Comparison    The study is normal. The study is low risk.   No ST deviation was noted.   LV perfusion is normal. There is no evidence of ischemia. There is no evidence of infarction.   Left ventricular function is normal. Nuclear stress EF: 54 %. The left ventricular ejection fraction is mildly decreased (45-54%). End diastolic cavity size is normal. End systolic cavity size is normal.  Prior study available for comparison from 09/03/2016.  Echo 05/19/23: IMPRESSIONS     1. Strain does not assess the basal septal and does not likely truly  reflect LV function.  . Left ventricular ejection fraction, by estimation,  is 55 to 60%. The left ventricle has normal function. The left ventricle  has no regional wall motion  abnormalities. Left ventricular diastolic parameters are indeterminate.  The average left ventricular global longitudinal strain is -16.6 %.   2. Right ventricular systolic function is normal. The right ventricular  size is normal. There is normal pulmonary artery systolic pressure.   3. Left atrial size was mildly dilated.   4. The mitral valve is degenerative. No evidence of mitral valve  regurgitation.   5. The aortic valve is calcified. Aortic valve regurgitation is not  visualized. Aortic valve sclerosis is present, with no evidence of aortic  valve stenosis.   6. Aortic dilatation noted. There is mild dilatation of the ascending  aorta, measuring 42 mm.   7. The inferior vena cava is normal in size with greater than 50%  respiratory variability, suggesting right atrial pressure of 3 mmHg.   Comparison(s): No significant change from prior study.    Assessment / Plan: 1. Coronary disease with prior stenting of the left circumflex coronary in 2009 with a bare-metal stent. S/p stenting of the first OM, distal LCx and mid RCA with DES in August 2018.  He has no significant angina.  Continue DAPT with ASA and Plavix . Given extensive amount of stenting he has had done I would favor continuing DAPT indefinitely unless he has bleeding issues.     2. 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 No CHF. BP is well controlled today  3. Dyslipidemia. Lipitor  dose reduced to 40 mg daily. Also on Zetia  and fenofibrate . Will update lab work today   4. S/p CVA.    I will follow up in one year.

## 2023-12-27 ENCOUNTER — Encounter: Payer: Self-pay | Admitting: Cardiology

## 2023-12-27 ENCOUNTER — Ambulatory Visit: Attending: Cardiology | Admitting: Cardiology

## 2023-12-27 VITALS — BP 140/74 | HR 67 | Ht 72.0 in | Wt 207.0 lb

## 2023-12-27 DIAGNOSIS — I2510 Atherosclerotic heart disease of native coronary artery without angina pectoris: Secondary | ICD-10-CM

## 2023-12-27 DIAGNOSIS — E785 Hyperlipidemia, unspecified: Secondary | ICD-10-CM

## 2023-12-27 DIAGNOSIS — I10 Essential (primary) hypertension: Secondary | ICD-10-CM

## 2023-12-27 DIAGNOSIS — Z8673 Personal history of transient ischemic attack (TIA), and cerebral infarction without residual deficits: Secondary | ICD-10-CM

## 2023-12-27 NOTE — Patient Instructions (Addendum)
 Medication Instructions:  Continue same medications *If you need a refill on your cardiac medications before your next appointment, please call your pharmacy*  Lab Work: None ordered  Testing/Procedures: None ordered  Follow-Up: At [facility], you and your health needs are our priority.  As part of our continuing mission to provide you with exceptional heart care, our providers are all part of one team.  This team includes your primary Cardiologist (physician) and Advanced Practice Providers or APPs (Physician Assistants and Nurse Practitioners) who all work together to provide you with the care you need, when you need it.  Your next appointment:  1 year     Call in August to schedule Dec appointment     Provider:  Dr.Jordan   We recommend signing up for the patient portal called MyChart.  Sign up information is provided on this After Visit Summary.  MyChart is used to connect with patients for Virtual Visits (Telemedicine).  Patients are able to view lab/test results, encounter notes, upcoming appointments, etc.  Non-urgent messages can be sent to your provider as well.   To learn more about what you can do with MyChart, go to [email or web address].

## 2024-02-21 ENCOUNTER — Ambulatory Visit

## 2024-03-10 ENCOUNTER — Ambulatory Visit
